# Patient Record
Sex: Female | Born: 1950 | Race: White | Hispanic: No | Marital: Married | Smoking: Former smoker
Health system: Southern US, Academic
[De-identification: ages and names within clinical notes are randomized; demographics above are authoritative.]

## PROBLEM LIST (undated history)

## (undated) DIAGNOSIS — Z800 Family history of malignant neoplasm of digestive organs: Secondary | ICD-10-CM

## (undated) DIAGNOSIS — R931 Abnormal findings on diagnostic imaging of heart and coronary circulation: Secondary | ICD-10-CM

## (undated) DIAGNOSIS — J449 Chronic obstructive pulmonary disease, unspecified: Secondary | ICD-10-CM

## (undated) DIAGNOSIS — C3491 Malignant neoplasm of unspecified part of right bronchus or lung: Secondary | ICD-10-CM

## (undated) DIAGNOSIS — K219 Gastro-esophageal reflux disease without esophagitis: Secondary | ICD-10-CM

## (undated) DIAGNOSIS — D649 Anemia, unspecified: Secondary | ICD-10-CM

## (undated) DIAGNOSIS — Z808 Family history of malignant neoplasm of other organs or systems: Secondary | ICD-10-CM

## (undated) DIAGNOSIS — Z8049 Family history of malignant neoplasm of other genital organs: Secondary | ICD-10-CM

## (undated) DIAGNOSIS — I10 Essential (primary) hypertension: Secondary | ICD-10-CM

## (undated) DIAGNOSIS — C342 Malignant neoplasm of middle lobe, bronchus or lung: Secondary | ICD-10-CM

## (undated) DIAGNOSIS — M1990 Unspecified osteoarthritis, unspecified site: Secondary | ICD-10-CM

## (undated) DIAGNOSIS — R0600 Dyspnea, unspecified: Secondary | ICD-10-CM

## (undated) DIAGNOSIS — I341 Nonrheumatic mitral (valve) prolapse: Secondary | ICD-10-CM

## (undated) DIAGNOSIS — C3490 Malignant neoplasm of unspecified part of unspecified bronchus or lung: Secondary | ICD-10-CM

## (undated) DIAGNOSIS — Z9221 Personal history of antineoplastic chemotherapy: Secondary | ICD-10-CM

## (undated) DIAGNOSIS — N6452 Nipple discharge: Secondary | ICD-10-CM

## (undated) DIAGNOSIS — N63 Unspecified lump in unspecified breast: Secondary | ICD-10-CM

## (undated) DIAGNOSIS — J309 Allergic rhinitis, unspecified: Secondary | ICD-10-CM

## (undated) DIAGNOSIS — K635 Polyp of colon: Secondary | ICD-10-CM

## (undated) DIAGNOSIS — J45909 Unspecified asthma, uncomplicated: Secondary | ICD-10-CM

## (undated) DIAGNOSIS — Z803 Family history of malignant neoplasm of breast: Secondary | ICD-10-CM

## (undated) DIAGNOSIS — Z923 Personal history of irradiation: Secondary | ICD-10-CM

## (undated) DIAGNOSIS — E785 Hyperlipidemia, unspecified: Secondary | ICD-10-CM

## (undated) DIAGNOSIS — C801 Malignant (primary) neoplasm, unspecified: Secondary | ICD-10-CM

## (undated) DIAGNOSIS — R011 Cardiac murmur, unspecified: Secondary | ICD-10-CM

## (undated) DIAGNOSIS — Z973 Presence of spectacles and contact lenses: Secondary | ICD-10-CM

## (undated) DIAGNOSIS — M129 Arthropathy, unspecified: Secondary | ICD-10-CM

## (undated) HISTORY — DX: Essential (primary) hypertension: I10

## (undated) HISTORY — PX: FOOT FRACTURE SURGERY: SHX645

## (undated) HISTORY — PX: KNEE ARTHROSCOPY: SUR90

## (undated) HISTORY — DX: Arthropathy, unspecified: M12.9

## (undated) HISTORY — DX: Malignant (primary) neoplasm, unspecified (CMS HCC): C80.1

## (undated) HISTORY — PX: SALIVARY GLAND SURGERY: SHX768

## (undated) HISTORY — DX: Family history of malignant neoplasm of other genital organs: Z80.49

## (undated) HISTORY — DX: Unspecified asthma, uncomplicated: J45.909

## (undated) HISTORY — PX: ABDOMINAL HYSTERECTOMY: SHX81

## (undated) HISTORY — DX: Family history of malignant neoplasm of other organs or systems: Z80.8

## (undated) HISTORY — DX: Malignant neoplasm of middle lobe, bronchus or lung: C34.2

## (undated) HISTORY — DX: Gastro-esophageal reflux disease without esophagitis: K21.9

## (undated) HISTORY — DX: Nonrheumatic mitral (valve) prolapse: I34.1

## (undated) HISTORY — PX: LUNG LOBECTOMY: SHX167

## (undated) HISTORY — DX: Family history of malignant neoplasm of digestive organs: Z80.0

## (undated) HISTORY — PX: BREAST EXCISIONAL BIOPSY: SUR124

## (undated) HISTORY — PX: BAND HEMORRHOIDECTOMY: SHX1213

## (undated) HISTORY — DX: Family history of malignant neoplasm of breast: Z80.3

---
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
## 1981-07-04 HISTORY — PX: SALIVARY GLAND SURGERY: SHX768

## 1990-04-05 DIAGNOSIS — R931 Abnormal findings on diagnostic imaging of heart and coronary circulation: Secondary | ICD-10-CM | POA: Insufficient documentation

## 2003-04-01 ENCOUNTER — Ambulatory Visit (HOSPITAL_COMMUNITY): Payer: Self-pay

## 2003-05-08 ENCOUNTER — Other Ambulatory Visit: Payer: Self-pay

## 2003-12-16 ENCOUNTER — Ambulatory Visit (INDEPENDENT_AMBULATORY_CARE_PROVIDER_SITE_OTHER): Payer: Self-pay

## 2004-01-06 ENCOUNTER — Ambulatory Visit (INDEPENDENT_AMBULATORY_CARE_PROVIDER_SITE_OTHER): Payer: Self-pay

## 2004-01-13 ENCOUNTER — Ambulatory Visit (INDEPENDENT_AMBULATORY_CARE_PROVIDER_SITE_OTHER): Payer: Self-pay

## 2004-04-22 ENCOUNTER — Ambulatory Visit (INDEPENDENT_AMBULATORY_CARE_PROVIDER_SITE_OTHER): Payer: Self-pay

## 2004-07-15 ENCOUNTER — Ambulatory Visit: Payer: Self-pay | Admitting: General Surgery

## 2005-05-18 ENCOUNTER — Ambulatory Visit: Payer: Self-pay | Admitting: Internal Medicine

## 2005-07-04 HISTORY — PX: KNEE SURGERY: SHX244

## 2005-07-20 ENCOUNTER — Ambulatory Visit: Payer: Self-pay | Admitting: General Surgery

## 2005-07-29 ENCOUNTER — Ambulatory Visit: Payer: Self-pay

## 2005-12-05 ENCOUNTER — Ambulatory Visit: Payer: Self-pay | Admitting: General Practice

## 2005-12-22 ENCOUNTER — Encounter: Payer: Self-pay | Admitting: General Practice

## 2006-01-01 ENCOUNTER — Encounter: Payer: Self-pay | Admitting: General Practice

## 2006-05-30 ENCOUNTER — Ambulatory Visit: Payer: Self-pay | Admitting: Internal Medicine

## 2006-06-28 ENCOUNTER — Ambulatory Visit: Payer: Self-pay | Admitting: Internal Medicine

## 2006-07-04 HISTORY — PX: UPPER GI ENDOSCOPY: SHX6162

## 2006-07-04 HISTORY — PX: COLONOSCOPY: SHX174

## 2006-07-19 ENCOUNTER — Ambulatory Visit: Payer: Self-pay | Admitting: General Surgery

## 2006-09-07 ENCOUNTER — Ambulatory Visit: Payer: Self-pay | Admitting: Gastroenterology

## 2006-09-26 ENCOUNTER — Ambulatory Visit: Payer: Self-pay | Admitting: Gastroenterology

## 2006-10-03 ENCOUNTER — Ambulatory Visit: Payer: Self-pay | Admitting: Gastroenterology

## 2007-07-26 ENCOUNTER — Ambulatory Visit: Payer: Self-pay | Admitting: General Surgery

## 2008-08-06 ENCOUNTER — Ambulatory Visit: Payer: Self-pay | Admitting: General Surgery

## 2009-07-07 DIAGNOSIS — C4492 Squamous cell carcinoma of skin, unspecified: Secondary | ICD-10-CM

## 2009-07-07 HISTORY — DX: Squamous cell carcinoma of skin, unspecified: C44.92

## 2009-08-05 ENCOUNTER — Ambulatory Visit: Payer: Self-pay | Admitting: General Surgery

## 2010-08-09 ENCOUNTER — Ambulatory Visit: Payer: Self-pay | Admitting: General Surgery

## 2010-09-09 ENCOUNTER — Encounter: Payer: Self-pay | Admitting: Orthopedic Surgery

## 2010-10-03 ENCOUNTER — Encounter: Payer: Self-pay | Admitting: Orthopedic Surgery

## 2011-02-02 ENCOUNTER — Ambulatory Visit: Payer: Self-pay | Admitting: Internal Medicine

## 2011-02-15 ENCOUNTER — Ambulatory Visit: Payer: Self-pay | Admitting: Internal Medicine

## 2011-03-05 ENCOUNTER — Ambulatory Visit: Payer: Self-pay | Admitting: Internal Medicine

## 2011-05-04 ENCOUNTER — Ambulatory Visit: Payer: Self-pay | Admitting: Internal Medicine

## 2011-05-05 ENCOUNTER — Ambulatory Visit: Payer: Self-pay | Admitting: Internal Medicine

## 2011-08-31 ENCOUNTER — Ambulatory Visit: Payer: Self-pay | Admitting: Internal Medicine

## 2011-08-31 ENCOUNTER — Ambulatory Visit: Payer: Self-pay | Admitting: General Surgery

## 2011-08-31 LAB — IRON AND TIBC
Iron Saturation: 28 %
Iron: 101 ug/dL (ref 50–170)
Unbound Iron-Bind.Cap.: 255 ug/dL

## 2011-09-02 ENCOUNTER — Ambulatory Visit: Payer: Self-pay | Admitting: Internal Medicine

## 2011-12-14 ENCOUNTER — Ambulatory Visit: Payer: Self-pay | Admitting: Internal Medicine

## 2011-12-14 LAB — IRON AND TIBC
Iron Bind.Cap.(Total): 346 ug/dL (ref 250–450)
Iron: 108 ug/dL (ref 50–170)

## 2011-12-14 LAB — FERRITIN: Ferritin (ARMC): 113 ng/mL (ref 8–388)

## 2011-12-14 LAB — CANCER CENTER HEMOGLOBIN: HGB: 13.2 g/dL (ref 12.0–16.0)

## 2012-01-02 ENCOUNTER — Ambulatory Visit: Payer: Self-pay | Admitting: Internal Medicine

## 2012-01-10 ENCOUNTER — Emergency Department (HOSPITAL_COMMUNITY): Payer: Self-pay

## 2012-01-11 ENCOUNTER — Ambulatory Visit: Payer: Self-pay | Admitting: Internal Medicine

## 2012-04-04 ENCOUNTER — Ambulatory Visit: Payer: Self-pay | Admitting: Internal Medicine

## 2012-04-04 LAB — CBC CANCER CENTER
Basophil %: 0.8 %
Eosinophil #: 0.1 x10 3/mm (ref 0.0–0.7)
Eosinophil %: 2.4 %
HCT: 41.3 % (ref 35.0–47.0)
HGB: 13.5 g/dL (ref 12.0–16.0)
Lymphocyte %: 24.3 %
MCHC: 32.7 g/dL (ref 32.0–36.0)
Monocyte #: 0.3 x10 3/mm (ref 0.2–0.9)
Neutrophil #: 2.8 x10 3/mm (ref 1.4–6.5)
Neutrophil %: 64.8 %
RBC: 4.53 10*6/uL (ref 3.80–5.20)

## 2012-04-04 LAB — IRON AND TIBC
Iron Bind.Cap.(Total): 341 ug/dL (ref 250–450)
Iron Saturation: 30 %
Iron: 103 ug/dL (ref 50–170)
Unbound Iron-Bind.Cap.: 238 ug/dL

## 2012-05-04 ENCOUNTER — Ambulatory Visit: Payer: Self-pay | Admitting: Internal Medicine

## 2012-05-22 ENCOUNTER — Ambulatory Visit: Payer: Self-pay | Admitting: Gastroenterology

## 2012-08-10 ENCOUNTER — Encounter: Payer: Self-pay | Admitting: *Deleted

## 2012-08-10 DIAGNOSIS — I341 Nonrheumatic mitral (valve) prolapse: Secondary | ICD-10-CM | POA: Insufficient documentation

## 2012-08-10 DIAGNOSIS — K219 Gastro-esophageal reflux disease without esophagitis: Secondary | ICD-10-CM | POA: Insufficient documentation

## 2012-08-10 DIAGNOSIS — J45909 Unspecified asthma, uncomplicated: Secondary | ICD-10-CM | POA: Insufficient documentation

## 2012-08-31 ENCOUNTER — Ambulatory Visit: Payer: Self-pay | Admitting: Internal Medicine

## 2013-01-07 ENCOUNTER — Ambulatory Visit: Payer: Self-pay | Admitting: General Practice

## 2013-02-01 ENCOUNTER — Ambulatory Visit: Payer: Self-pay | Admitting: Anesthesiology

## 2013-02-06 ENCOUNTER — Ambulatory Visit: Payer: Self-pay | Admitting: General Practice

## 2013-07-04 DIAGNOSIS — C3491 Malignant neoplasm of unspecified part of right bronchus or lung: Secondary | ICD-10-CM

## 2013-07-04 HISTORY — DX: Malignant neoplasm of unspecified part of right bronchus or lung: C34.91

## 2013-07-25 ENCOUNTER — Ambulatory Visit: Payer: Self-pay | Admitting: Internal Medicine

## 2013-08-30 ENCOUNTER — Ambulatory Visit: Payer: Self-pay | Admitting: Internal Medicine

## 2013-09-02 ENCOUNTER — Ambulatory Visit: Payer: Self-pay | Admitting: Internal Medicine

## 2013-09-04 ENCOUNTER — Ambulatory Visit: Payer: Self-pay | Admitting: Internal Medicine

## 2013-09-09 ENCOUNTER — Ambulatory Visit: Payer: Self-pay | Admitting: Cardiothoracic Surgery

## 2013-09-12 ENCOUNTER — Ambulatory Visit: Payer: Self-pay | Admitting: Cardiothoracic Surgery

## 2013-09-12 LAB — COMPREHENSIVE METABOLIC PANEL
ALBUMIN: 4 g/dL (ref 3.4–5.0)
Alkaline Phosphatase: 87 U/L
Anion Gap: 10 (ref 7–16)
BUN: 13 mg/dL (ref 7–18)
Bilirubin,Total: 0.4 mg/dL (ref 0.2–1.0)
CHLORIDE: 102 mmol/L (ref 98–107)
Calcium, Total: 9.7 mg/dL (ref 8.5–10.1)
Co2: 29 mmol/L (ref 21–32)
Creatinine: 0.76 mg/dL (ref 0.60–1.30)
Glucose: 100 mg/dL — ABNORMAL HIGH (ref 65–99)
Osmolality: 281 (ref 275–301)
Potassium: 4 mmol/L (ref 3.5–5.1)
SGOT(AST): 25 U/L (ref 15–37)
SGPT (ALT): 30 U/L (ref 12–78)
Sodium: 141 mmol/L (ref 136–145)
TOTAL PROTEIN: 8.1 g/dL (ref 6.4–8.2)

## 2013-09-12 LAB — CBC CANCER CENTER
BASOS ABS: 0.1 x10 3/mm (ref 0.0–0.1)
Basophil %: 1.2 %
Eosinophil #: 0.2 x10 3/mm (ref 0.0–0.7)
Eosinophil %: 4.1 %
HCT: 42.3 % (ref 35.0–47.0)
HGB: 14 g/dL (ref 12.0–16.0)
LYMPHS PCT: 20.1 %
Lymphocyte #: 1 x10 3/mm (ref 1.0–3.6)
MCH: 29.5 pg (ref 26.0–34.0)
MCHC: 33.2 g/dL (ref 32.0–36.0)
MCV: 89 fL (ref 80–100)
MONO ABS: 0.4 x10 3/mm (ref 0.2–0.9)
MONOS PCT: 8.4 %
NEUTROS PCT: 66.2 %
Neutrophil #: 3.4 x10 3/mm (ref 1.4–6.5)
Platelet: 351 x10 3/mm (ref 150–440)
RBC: 4.76 10*6/uL (ref 3.80–5.20)
RDW: 13.6 % (ref 11.5–14.5)
WBC: 5.1 x10 3/mm (ref 3.6–11.0)

## 2013-09-12 LAB — PROTIME-INR
INR: 1
PROTHROMBIN TIME: 12.6 s (ref 11.5–14.7)

## 2013-09-12 LAB — APTT: Activated PTT: 37.3 secs — ABNORMAL HIGH (ref 23.6–35.9)

## 2013-09-18 ENCOUNTER — Ambulatory Visit: Payer: Self-pay | Admitting: Cardiothoracic Surgery

## 2013-10-02 ENCOUNTER — Ambulatory Visit: Payer: Self-pay | Admitting: Cardiothoracic Surgery

## 2013-10-07 ENCOUNTER — Ambulatory Visit: Payer: Self-pay | Admitting: Cardiothoracic Surgery

## 2013-10-09 LAB — PATHOLOGY REPORT

## 2013-10-10 LAB — COMPREHENSIVE METABOLIC PANEL
ALK PHOS: 69 U/L
ANION GAP: 3 — AB (ref 7–16)
AST: 17 U/L (ref 15–37)
Albumin: 4.1 g/dL (ref 3.4–5.0)
BUN: 15 mg/dL (ref 7–18)
Bilirubin,Total: 0.4 mg/dL (ref 0.2–1.0)
Calcium, Total: 9.7 mg/dL (ref 8.5–10.1)
Chloride: 100 mmol/L (ref 98–107)
Co2: 36 mmol/L — ABNORMAL HIGH (ref 21–32)
Creatinine: 1.06 mg/dL (ref 0.60–1.30)
EGFR (African American): >60
EGFR (Non-African Amer.): 56 — ABNORMAL LOW
Glucose: 108 mg/dL — ABNORMAL HIGH (ref 65–99)
Osmolality: 279 (ref 275–301)
Potassium: 3.6 mmol/L (ref 3.5–5.1)
SGPT (ALT): 25 U/L (ref 12–78)
Sodium: 139 mmol/L (ref 136–145)
TOTAL PROTEIN: 7.9 g/dL (ref 6.4–8.2)

## 2013-10-10 LAB — PROTIME-INR
INR: 1
Prothrombin Time: 13.4 secs (ref 11.5–14.7)

## 2013-10-10 LAB — CBC CANCER CENTER
Basophil #: 0.1 x10 3/mm (ref 0.0–0.1)
Basophil %: 1.1 %
Eosinophil #: 0.1 x10 3/mm (ref 0.0–0.7)
Eosinophil %: 2.6 %
HCT: 42.8 % (ref 35.0–47.0)
HGB: 14 g/dL (ref 12.0–16.0)
LYMPHS ABS: 1.4 x10 3/mm (ref 1.0–3.6)
LYMPHS PCT: 29.3 %
MCH: 29.3 pg (ref 26.0–34.0)
MCHC: 32.8 g/dL (ref 32.0–36.0)
MCV: 89 fL (ref 80–100)
MONO ABS: 0.3 x10 3/mm (ref 0.2–0.9)
MONOS PCT: 6.5 %
NEUTROS ABS: 2.9 x10 3/mm (ref 1.4–6.5)
NEUTROS PCT: 60.5 %
Platelet: 339 x10 3/mm (ref 150–440)
RBC: 4.79 10*6/uL (ref 3.80–5.20)
RDW: 13.6 % (ref 11.5–14.5)
WBC: 4.8 x10 3/mm (ref 3.6–11.0)

## 2013-10-10 LAB — APTT: Activated PTT: 34.4 secs (ref 23.6–35.9)

## 2013-10-22 ENCOUNTER — Ambulatory Visit: Payer: Self-pay

## 2013-10-30 ENCOUNTER — Inpatient Hospital Stay: Payer: Self-pay | Admitting: Internal Medicine

## 2013-10-30 LAB — HEMOGLOBIN: HGB: 12.4 g/dL (ref 12.0–16.0)

## 2013-10-31 LAB — BASIC METABOLIC PANEL
Anion Gap: 4 — ABNORMAL LOW (ref 7–16)
BUN: 6 mg/dL — ABNORMAL LOW (ref 7–18)
CALCIUM: 8.2 mg/dL — AB (ref 8.5–10.1)
CHLORIDE: 105 mmol/L (ref 98–107)
Co2: 27 mmol/L (ref 21–32)
Creatinine: 0.47 mg/dL — ABNORMAL LOW (ref 0.60–1.30)
EGFR (African American): >60
Glucose: 140 mg/dL — ABNORMAL HIGH (ref 65–99)
Osmolality: 272 (ref 275–301)
Potassium: 4.1 mmol/L (ref 3.5–5.1)
Sodium: 136 mmol/L (ref 136–145)

## 2013-10-31 LAB — CBC WITH DIFFERENTIAL/PLATELET
BASOS ABS: 0 10*3/uL (ref 0.0–0.1)
BASOS PCT: 0.2 %
Eosinophil #: 0 10*3/uL (ref 0.0–0.7)
Eosinophil %: 0 %
HCT: 36.3 % (ref 35.0–47.0)
HGB: 12 g/dL (ref 12.0–16.0)
LYMPHS ABS: 0.8 10*3/uL — AB (ref 1.0–3.6)
Lymphocyte %: 8 %
MCH: 29.9 pg (ref 26.0–34.0)
MCHC: 33.2 g/dL (ref 32.0–36.0)
MCV: 90 fL (ref 80–100)
MONO ABS: 0.7 x10 3/mm (ref 0.2–0.9)
Monocyte %: 6.9 %
NEUTROS ABS: 8.4 10*3/uL — AB (ref 1.4–6.5)
Neutrophil %: 84.9 %
Platelet: 271 10*3/uL (ref 150–440)
RBC: 4.02 10*6/uL (ref 3.80–5.20)
RDW: 13.8 % (ref 11.5–14.5)
WBC: 9.9 10*3/uL (ref 3.6–11.0)

## 2013-11-01 ENCOUNTER — Ambulatory Visit: Payer: Self-pay | Admitting: Cardiothoracic Surgery

## 2013-11-01 ENCOUNTER — Ambulatory Visit: Payer: Self-pay | Admitting: Oncology

## 2013-11-01 LAB — CBC WITH DIFFERENTIAL/PLATELET
BASOS ABS: 0 10*3/uL (ref 0.0–0.1)
Basophil %: 0.3 %
Eosinophil #: 0 10*3/uL (ref 0.0–0.7)
Eosinophil %: 0.3 %
HCT: 34.2 % — ABNORMAL LOW (ref 35.0–47.0)
HGB: 11.1 g/dL — AB (ref 12.0–16.0)
LYMPHS PCT: 8.5 %
Lymphocyte #: 0.6 10*3/uL — ABNORMAL LOW (ref 1.0–3.6)
MCH: 29.6 pg (ref 26.0–34.0)
MCHC: 32.5 g/dL (ref 32.0–36.0)
MCV: 91 fL (ref 80–100)
MONOS PCT: 7.5 %
Monocyte #: 0.5 x10 3/mm (ref 0.2–0.9)
Neutrophil #: 6 10*3/uL (ref 1.4–6.5)
Neutrophil %: 83.4 %
Platelet: 191 10*3/uL (ref 150–440)
RBC: 3.75 10*6/uL — ABNORMAL LOW (ref 3.80–5.20)
RDW: 13.8 % (ref 11.5–14.5)
WBC: 7.1 10*3/uL (ref 3.6–11.0)

## 2013-11-01 LAB — BASIC METABOLIC PANEL
Anion Gap: 4 — ABNORMAL LOW (ref 7–16)
BUN: 5 mg/dL — ABNORMAL LOW (ref 7–18)
CHLORIDE: 101 mmol/L (ref 98–107)
CO2: 26 mmol/L (ref 21–32)
Calcium, Total: 8.2 mg/dL — ABNORMAL LOW (ref 8.5–10.1)
Creatinine: 0.37 mg/dL — ABNORMAL LOW (ref 0.60–1.30)
GLUCOSE: 126 mg/dL — AB (ref 65–99)
Osmolality: 261 (ref 275–301)
Potassium: 3.8 mmol/L (ref 3.5–5.1)
Sodium: 131 mmol/L — ABNORMAL LOW (ref 136–145)

## 2013-11-02 LAB — BASIC METABOLIC PANEL
Anion Gap: 4 — ABNORMAL LOW (ref 7–16)
BUN: 6 mg/dL — ABNORMAL LOW (ref 7–18)
CHLORIDE: 106 mmol/L (ref 98–107)
Calcium, Total: 8.5 mg/dL (ref 8.5–10.1)
Co2: 25 mmol/L (ref 21–32)
Creatinine: 0.39 mg/dL — ABNORMAL LOW (ref 0.60–1.30)
EGFR (Non-African Amer.): >60
GLUCOSE: 97 mg/dL (ref 65–99)
OSMOLALITY: 268 (ref 275–301)
Potassium: 3.3 mmol/L — ABNORMAL LOW (ref 3.5–5.1)
Sodium: 135 mmol/L — ABNORMAL LOW (ref 136–145)

## 2013-11-02 LAB — URINALYSIS, COMPLETE
Bacteria: NONE SEEN
Bilirubin,UR: NEGATIVE
Glucose,UR: NEGATIVE mg/dL (ref 0–75)
Leukocyte Esterase: NEGATIVE
Nitrite: NEGATIVE
PROTEIN: NEGATIVE
Ph: 6 (ref 4.5–8.0)
Specific Gravity: 1.003 (ref 1.003–1.030)
Squamous Epithelial: NONE SEEN
WBC UR: NONE SEEN /HPF (ref 0–5)

## 2013-11-02 LAB — CBC WITH DIFFERENTIAL/PLATELET
BASOS PCT: 0.2 %
Basophil #: 0 10*3/uL (ref 0.0–0.1)
EOS ABS: 0.1 10*3/uL (ref 0.0–0.7)
EOS PCT: 1.7 %
HCT: 36.5 % (ref 35.0–47.0)
HGB: 11.9 g/dL — ABNORMAL LOW (ref 12.0–16.0)
LYMPHS PCT: 6.1 %
Lymphocyte #: 0.4 10*3/uL — ABNORMAL LOW (ref 1.0–3.6)
MCH: 29.5 pg (ref 26.0–34.0)
MCHC: 32.7 g/dL (ref 32.0–36.0)
MCV: 90 fL (ref 80–100)
MONOS PCT: 6 %
Monocyte #: 0.4 x10 3/mm (ref 0.2–0.9)
NEUTROS ABS: 5.7 10*3/uL (ref 1.4–6.5)
NEUTROS PCT: 86 %
Platelet: 236 10*3/uL (ref 150–440)
RBC: 4.04 10*6/uL (ref 3.80–5.20)
RDW: 13.6 % (ref 11.5–14.5)
WBC: 6.6 10*3/uL (ref 3.6–11.0)

## 2013-11-02 LAB — PATHOLOGY REPORT

## 2013-11-05 LAB — BASIC METABOLIC PANEL
Anion Gap: 5 — ABNORMAL LOW (ref 7–16)
BUN: 7 mg/dL (ref 7–18)
CHLORIDE: 103 mmol/L (ref 98–107)
Calcium, Total: 8.7 mg/dL (ref 8.5–10.1)
Co2: 31 mmol/L (ref 21–32)
Creatinine: 0.69 mg/dL (ref 0.60–1.30)
EGFR (Non-African Amer.): >60
Glucose: 104 mg/dL — ABNORMAL HIGH (ref 65–99)
Osmolality: 276 (ref 275–301)
POTASSIUM: 3.5 mmol/L (ref 3.5–5.1)
Sodium: 139 mmol/L (ref 136–145)

## 2013-11-05 LAB — CBC WITH DIFFERENTIAL/PLATELET
Basophil #: 0 10*3/uL (ref 0.0–0.1)
Basophil %: 0.8 %
Eosinophil #: 0.3 10*3/uL (ref 0.0–0.7)
Eosinophil %: 8 %
HCT: 31.8 % — ABNORMAL LOW (ref 35.0–47.0)
HGB: 10.9 g/dL — AB (ref 12.0–16.0)
LYMPHS PCT: 23.7 %
Lymphocyte #: 0.9 10*3/uL — ABNORMAL LOW (ref 1.0–3.6)
MCH: 30.2 pg (ref 26.0–34.0)
MCHC: 34.2 g/dL (ref 32.0–36.0)
MCV: 88 fL (ref 80–100)
Monocyte #: 0.5 x10 3/mm (ref 0.2–0.9)
Monocyte %: 14.3 %
Neutrophil #: 2 10*3/uL (ref 1.4–6.5)
Neutrophil %: 53.2 %
Platelet: 286 10*3/uL (ref 150–440)
RBC: 3.61 10*6/uL — AB (ref 3.80–5.20)
RDW: 13.1 % (ref 11.5–14.5)
WBC: 3.7 10*3/uL (ref 3.6–11.0)

## 2013-11-07 LAB — IRON AND TIBC
IRON: 39 ug/dL — AB (ref 50–170)
Iron Bind.Cap.(Total): 272 ug/dL (ref 250–450)
Iron Saturation: 14 %
Unbound Iron-Bind.Cap.: 233 ug/dL

## 2013-11-07 LAB — FERRITIN: Ferritin (ARMC): 164 ng/mL (ref 8–388)

## 2013-11-21 LAB — COMPREHENSIVE METABOLIC PANEL
ALT: 26 U/L (ref 12–78)
Albumin: 3.4 g/dL (ref 3.4–5.0)
Alkaline Phosphatase: 93 U/L
Anion Gap: 7 (ref 7–16)
BILIRUBIN TOTAL: 0.3 mg/dL (ref 0.2–1.0)
BUN: 10 mg/dL (ref 7–18)
CHLORIDE: 103 mmol/L (ref 98–107)
Calcium, Total: 8.4 mg/dL — ABNORMAL LOW (ref 8.5–10.1)
Co2: 32 mmol/L (ref 21–32)
Creatinine: 0.67 mg/dL (ref 0.60–1.30)
EGFR (Non-African Amer.): >60
Glucose: 79 mg/dL (ref 65–99)
Osmolality: 281 (ref 275–301)
POTASSIUM: 3.8 mmol/L (ref 3.5–5.1)
SGOT(AST): 16 U/L (ref 15–37)
SODIUM: 142 mmol/L (ref 136–145)
Total Protein: 7.1 g/dL (ref 6.4–8.2)

## 2013-11-21 LAB — CBC CANCER CENTER
BASOS ABS: 0.1 x10 3/mm (ref 0.0–0.1)
Basophil %: 1.2 %
Eosinophil #: 0.8 x10 3/mm — ABNORMAL HIGH (ref 0.0–0.7)
Eosinophil %: 13.7 %
HCT: 36.6 % (ref 35.0–47.0)
HGB: 12.4 g/dL (ref 12.0–16.0)
Lymphocyte #: 1.1 x10 3/mm (ref 1.0–3.6)
Lymphocyte %: 19.1 %
MCH: 29.5 pg (ref 26.0–34.0)
MCHC: 33.9 g/dL (ref 32.0–36.0)
MCV: 87 fL (ref 80–100)
Monocyte #: 0.5 x10 3/mm (ref 0.2–0.9)
Monocyte %: 7.8 %
NEUTROS ABS: 3.5 x10 3/mm (ref 1.4–6.5)
NEUTROS PCT: 58.2 %
PLATELETS: 436 x10 3/mm (ref 150–440)
RBC: 4.2 10*6/uL (ref 3.80–5.20)
RDW: 13.7 % (ref 11.5–14.5)
WBC: 5.9 x10 3/mm (ref 3.6–11.0)

## 2013-11-28 LAB — CBC CANCER CENTER
Basophil #: 0 x10 3/mm (ref 0.0–0.1)
Basophil %: 1.1 %
Eosinophil #: 0.1 x10 3/mm (ref 0.0–0.7)
Eosinophil %: 2.4 %
HCT: 35.4 % (ref 35.0–47.0)
HGB: 11.7 g/dL — ABNORMAL LOW (ref 12.0–16.0)
LYMPHS PCT: 33.5 %
Lymphocyte #: 1.1 x10 3/mm (ref 1.0–3.6)
MCH: 29.6 pg (ref 26.0–34.0)
MCHC: 33.1 g/dL (ref 32.0–36.0)
MCV: 89 fL (ref 80–100)
Monocyte #: 0.3 x10 3/mm (ref 0.2–0.9)
Monocyte %: 9.2 %
NEUTROS ABS: 1.8 x10 3/mm (ref 1.4–6.5)
Neutrophil %: 53.8 %
Platelet: 249 x10 3/mm (ref 150–440)
RBC: 3.96 10*6/uL (ref 3.80–5.20)
RDW: 13.5 % (ref 11.5–14.5)
WBC: 3.3 x10 3/mm — ABNORMAL LOW (ref 3.6–11.0)

## 2013-12-02 ENCOUNTER — Ambulatory Visit: Payer: Self-pay | Admitting: Cardiothoracic Surgery

## 2013-12-02 ENCOUNTER — Ambulatory Visit: Payer: Self-pay | Admitting: Oncology

## 2013-12-05 LAB — CBC CANCER CENTER
BASOS ABS: 0 x10 3/mm (ref 0.0–0.1)
Basophil %: 0.5 %
EOS ABS: 0 x10 3/mm (ref 0.0–0.7)
Eosinophil %: 0.7 %
HCT: 38.1 % (ref 35.0–47.0)
HGB: 12.8 g/dL (ref 12.0–16.0)
LYMPHS ABS: 0.9 x10 3/mm — AB (ref 1.0–3.6)
Lymphocyte %: 18.6 %
MCH: 29.7 pg (ref 26.0–34.0)
MCHC: 33.5 g/dL (ref 32.0–36.0)
MCV: 89 fL (ref 80–100)
MONO ABS: 0.6 x10 3/mm (ref 0.2–0.9)
Monocyte %: 11.3 %
NEUTROS PCT: 68.9 %
Neutrophil #: 3.3 x10 3/mm (ref 1.4–6.5)
PLATELETS: 219 x10 3/mm (ref 150–440)
RBC: 4.3 10*6/uL (ref 3.80–5.20)
RDW: 13.9 % (ref 11.5–14.5)
WBC: 4.9 x10 3/mm (ref 3.6–11.0)

## 2013-12-12 LAB — CBC CANCER CENTER
Basophil #: 0 x10 3/mm (ref 0.0–0.1)
Basophil %: 1 %
EOS ABS: 0 x10 3/mm (ref 0.0–0.7)
EOS PCT: 1 %
HCT: 38 % (ref 35.0–47.0)
HGB: 12.5 g/dL (ref 12.0–16.0)
Lymphocyte #: 1 x10 3/mm (ref 1.0–3.6)
Lymphocyte %: 33.3 %
MCH: 29.6 pg (ref 26.0–34.0)
MCHC: 33 g/dL (ref 32.0–36.0)
MCV: 90 fL (ref 80–100)
MONOS PCT: 11.2 %
Monocyte #: 0.3 x10 3/mm (ref 0.2–0.9)
Neutrophil #: 1.6 x10 3/mm (ref 1.4–6.5)
Neutrophil %: 53.5 %
Platelet: 503 x10 3/mm — ABNORMAL HIGH (ref 150–440)
RBC: 4.24 10*6/uL (ref 3.80–5.20)
RDW: 14.2 % (ref 11.5–14.5)
WBC: 3 x10 3/mm — ABNORMAL LOW (ref 3.6–11.0)

## 2013-12-12 LAB — COMPREHENSIVE METABOLIC PANEL
ALK PHOS: 88 U/L
ALT: 27 U/L (ref 12–78)
Albumin: 3.6 g/dL (ref 3.4–5.0)
Anion Gap: 8 (ref 7–16)
BUN: 13 mg/dL (ref 7–18)
Bilirubin,Total: 0.3 mg/dL (ref 0.2–1.0)
CALCIUM: 9.2 mg/dL (ref 8.5–10.1)
CHLORIDE: 102 mmol/L (ref 98–107)
Co2: 30 mmol/L (ref 21–32)
Creatinine: 0.76 mg/dL (ref 0.60–1.30)
EGFR (African American): >60
EGFR (Non-African Amer.): >60
Glucose: 90 mg/dL (ref 65–99)
OSMOLALITY: 279 (ref 275–301)
Potassium: 3.9 mmol/L (ref 3.5–5.1)
SGOT(AST): 16 U/L (ref 15–37)
Sodium: 140 mmol/L (ref 136–145)
Total Protein: 7.5 g/dL (ref 6.4–8.2)

## 2013-12-17 LAB — CBC CANCER CENTER
BASOS ABS: 0 x10 3/mm (ref 0.0–0.1)
Basophil %: 1.2 %
EOS ABS: 0.1 x10 3/mm (ref 0.0–0.7)
EOS PCT: 2.4 %
HCT: 38.1 % (ref 35.0–47.0)
HGB: 12.8 g/dL (ref 12.0–16.0)
Lymphocyte #: 1.1 x10 3/mm (ref 1.0–3.6)
Lymphocyte %: 34.8 %
MCH: 29.7 pg (ref 26.0–34.0)
MCHC: 33.5 g/dL (ref 32.0–36.0)
MCV: 89 fL (ref 80–100)
MONO ABS: 0.2 x10 3/mm (ref 0.2–0.9)
MONOS PCT: 5.1 %
Neutrophil #: 1.7 x10 3/mm (ref 1.4–6.5)
Neutrophil %: 56.5 %
Platelet: 361 x10 3/mm (ref 150–440)
RBC: 4.29 10*6/uL (ref 3.80–5.20)
RDW: 13.9 % (ref 11.5–14.5)
WBC: 3 x10 3/mm — AB (ref 3.6–11.0)

## 2013-12-25 ENCOUNTER — Other Ambulatory Visit (INDEPENDENT_AMBULATORY_CARE_PROVIDER_SITE_OTHER): Payer: No Typology Code available for payment source | Admitting: Surgery

## 2013-12-25 DIAGNOSIS — Z1211 Encounter for screening for malignant neoplasm of colon: Secondary | ICD-10-CM

## 2013-12-26 LAB — CBC CANCER CENTER
BASOS ABS: 0 x10 3/mm (ref 0.0–0.1)
BASOS PCT: 0.6 %
Eosinophil #: 0 x10 3/mm (ref 0.0–0.7)
Eosinophil %: 0.9 %
HCT: 39.7 % (ref 35.0–47.0)
HGB: 13 g/dL (ref 12.0–16.0)
LYMPHS ABS: 1 x10 3/mm (ref 1.0–3.6)
Lymphocyte %: 28.1 %
MCH: 29.5 pg (ref 26.0–34.0)
MCHC: 32.8 g/dL (ref 32.0–36.0)
MCV: 90 fL (ref 80–100)
MONO ABS: 0.5 x10 3/mm (ref 0.2–0.9)
Monocyte %: 13.4 %
NEUTROS PCT: 57 %
Neutrophil #: 2 x10 3/mm (ref 1.4–6.5)
Platelet: 168 x10 3/mm (ref 150–440)
RBC: 4.41 10*6/uL (ref 3.80–5.20)
RDW: 14.4 % (ref 11.5–14.5)
WBC: 3.6 x10 3/mm (ref 3.6–11.0)

## 2014-01-01 ENCOUNTER — Ambulatory Visit: Payer: Self-pay | Admitting: Oncology

## 2014-01-01 ENCOUNTER — Ambulatory Visit: Payer: Self-pay | Admitting: Cardiothoracic Surgery

## 2014-01-02 LAB — COMPREHENSIVE METABOLIC PANEL
ALK PHOS: 93 U/L
ALT: 24 U/L (ref 12–78)
Albumin: 3.8 g/dL (ref 3.4–5.0)
Anion Gap: 9 (ref 7–16)
BUN: 14 mg/dL (ref 7–18)
Bilirubin,Total: 0.4 mg/dL (ref 0.2–1.0)
CO2: 29 mmol/L (ref 21–32)
CREATININE: 0.78 mg/dL (ref 0.60–1.30)
Calcium, Total: 9.4 mg/dL (ref 8.5–10.1)
Chloride: 103 mmol/L (ref 98–107)
EGFR (African American): >60
Glucose: 75 mg/dL (ref 65–99)
OSMOLALITY: 280 (ref 275–301)
POTASSIUM: 3.9 mmol/L (ref 3.5–5.1)
SGOT(AST): 24 U/L (ref 15–37)
Sodium: 141 mmol/L (ref 136–145)
TOTAL PROTEIN: 7.9 g/dL (ref 6.4–8.2)

## 2014-01-02 LAB — CBC CANCER CENTER
BASOS ABS: 0 x10 3/mm (ref 0.0–0.1)
BASOS PCT: 0.9 %
Eosinophil #: 0 x10 3/mm (ref 0.0–0.7)
Eosinophil %: 1.8 %
HCT: 37.6 % (ref 35.0–47.0)
HGB: 12.5 g/dL (ref 12.0–16.0)
LYMPHS PCT: 37 %
Lymphocyte #: 1 x10 3/mm (ref 1.0–3.6)
MCH: 29.9 pg (ref 26.0–34.0)
MCHC: 33.3 g/dL (ref 32.0–36.0)
MCV: 90 fL (ref 80–100)
MONOS PCT: 12.9 %
Monocyte #: 0.3 x10 3/mm (ref 0.2–0.9)
Neutrophil #: 1.3 x10 3/mm — ABNORMAL LOW (ref 1.4–6.5)
Neutrophil %: 47.4 %
PLATELETS: 303 x10 3/mm (ref 150–440)
RBC: 4.18 10*6/uL (ref 3.80–5.20)
RDW: 15 % — ABNORMAL HIGH (ref 11.5–14.5)
WBC: 2.7 x10 3/mm — AB (ref 3.6–11.0)

## 2014-01-23 LAB — CBC CANCER CENTER
BASOS ABS: 0 x10 3/mm (ref 0.0–0.1)
BASOS PCT: 0.9 %
EOS ABS: 0 x10 3/mm (ref 0.0–0.7)
EOS PCT: 0.8 %
HCT: 36.9 % (ref 35.0–47.0)
HGB: 12.2 g/dL (ref 12.0–16.0)
LYMPHS PCT: 37.4 %
Lymphocyte #: 1 x10 3/mm (ref 1.0–3.6)
MCH: 30.3 pg (ref 26.0–34.0)
MCHC: 33.1 g/dL (ref 32.0–36.0)
MCV: 91 fL (ref 80–100)
Monocyte #: 0.4 x10 3/mm (ref 0.2–0.9)
Monocyte %: 14.9 %
NEUTROS ABS: 1.2 x10 3/mm — AB (ref 1.4–6.5)
Neutrophil %: 46 %
Platelet: 297 x10 3/mm (ref 150–440)
RBC: 4.04 10*6/uL (ref 3.80–5.20)
RDW: 17.7 % — AB (ref 11.5–14.5)
WBC: 2.5 x10 3/mm — ABNORMAL LOW (ref 3.6–11.0)

## 2014-01-23 LAB — COMPREHENSIVE METABOLIC PANEL
Albumin: 3.4 g/dL (ref 3.4–5.0)
Alkaline Phosphatase: 79 U/L
Anion Gap: 8 (ref 7–16)
BUN: 16 mg/dL (ref 7–18)
Bilirubin,Total: 0.3 mg/dL (ref 0.2–1.0)
CALCIUM: 8.7 mg/dL (ref 8.5–10.1)
CHLORIDE: 103 mmol/L (ref 98–107)
Co2: 27 mmol/L (ref 21–32)
Creatinine: 0.79 mg/dL (ref 0.60–1.30)
EGFR (Non-African Amer.): >60
Glucose: 63 mg/dL — ABNORMAL LOW (ref 65–99)
OSMOLALITY: 275 (ref 275–301)
POTASSIUM: 4.2 mmol/L (ref 3.5–5.1)
SGOT(AST): 18 U/L (ref 15–37)
SGPT (ALT): 24 U/L
Sodium: 138 mmol/L (ref 136–145)
Total Protein: 6.9 g/dL (ref 6.4–8.2)

## 2014-01-27 DIAGNOSIS — E785 Hyperlipidemia, unspecified: Secondary | ICD-10-CM | POA: Insufficient documentation

## 2014-01-30 LAB — CBC CANCER CENTER
Basophil #: 0 x10 3/mm (ref 0.0–0.1)
Basophil %: 0.4 %
EOS ABS: 0 x10 3/mm (ref 0.0–0.7)
Eosinophil %: 0.2 %
HCT: 37.2 % (ref 35.0–47.0)
HGB: 12.4 g/dL (ref 12.0–16.0)
LYMPHS PCT: 12.8 %
Lymphocyte #: 1.3 x10 3/mm (ref 1.0–3.6)
MCH: 30.8 pg (ref 26.0–34.0)
MCHC: 33.2 g/dL (ref 32.0–36.0)
MCV: 93 fL (ref 80–100)
MONO ABS: 1.4 x10 3/mm — AB (ref 0.2–0.9)
MONOS PCT: 14.1 %
Neutrophil #: 7.4 x10 3/mm — ABNORMAL HIGH (ref 1.4–6.5)
Neutrophil %: 72.5 %
Platelet: 156 x10 3/mm (ref 150–440)
RBC: 4.01 10*6/uL (ref 3.80–5.20)
RDW: 18.6 % — AB (ref 11.5–14.5)
WBC: 10.2 x10 3/mm (ref 3.6–11.0)

## 2014-02-01 ENCOUNTER — Ambulatory Visit: Payer: Self-pay | Admitting: Cardiothoracic Surgery

## 2014-02-01 ENCOUNTER — Ambulatory Visit: Payer: Self-pay | Admitting: Oncology

## 2014-02-03 LAB — COMPREHENSIVE METABOLIC PANEL
ALK PHOS: 131 U/L — AB
ALT: 21 U/L
AST: 18 U/L (ref 15–37)
Albumin: 3.7 g/dL (ref 3.4–5.0)
Anion Gap: 8 (ref 7–16)
BUN: 11 mg/dL (ref 7–18)
Bilirubin,Total: 0.2 mg/dL (ref 0.2–1.0)
CREATININE: 0.94 mg/dL (ref 0.60–1.30)
Calcium, Total: 8.9 mg/dL (ref 8.5–10.1)
Chloride: 102 mmol/L (ref 98–107)
Co2: 29 mmol/L (ref 21–32)
EGFR (African American): >60
EGFR (Non-African Amer.): >60
GLUCOSE: 86 mg/dL (ref 65–99)
Osmolality: 276 (ref 275–301)
Potassium: 4 mmol/L (ref 3.5–5.1)
Sodium: 139 mmol/L (ref 136–145)
Total Protein: 7.2 g/dL (ref 6.4–8.2)

## 2014-02-03 LAB — CBC CANCER CENTER
Basophil #: 0 x10 3/mm (ref 0.0–0.1)
Basophil %: 0.4 %
EOS ABS: 0 x10 3/mm (ref 0.0–0.7)
EOS PCT: 0.1 %
HCT: 35.8 % (ref 35.0–47.0)
HGB: 11.9 g/dL — AB (ref 12.0–16.0)
LYMPHS ABS: 1 x10 3/mm (ref 1.0–3.6)
Lymphocyte %: 10 %
MCH: 30.7 pg (ref 26.0–34.0)
MCHC: 33.1 g/dL (ref 32.0–36.0)
MCV: 93 fL (ref 80–100)
MONO ABS: 0.8 x10 3/mm (ref 0.2–0.9)
MONOS PCT: 8.9 %
NEUTROS ABS: 7.6 x10 3/mm — AB (ref 1.4–6.5)
NEUTROS PCT: 80.6 %
PLATELETS: 211 x10 3/mm (ref 150–440)
RBC: 3.86 10*6/uL (ref 3.80–5.20)
RDW: 18.6 % — ABNORMAL HIGH (ref 11.5–14.5)
WBC: 9.5 x10 3/mm (ref 3.6–11.0)

## 2014-02-11 LAB — CBC CANCER CENTER
BASOS PCT: 0.8 %
Basophil #: 0 x10 3/mm (ref 0.0–0.1)
EOS ABS: 0 x10 3/mm (ref 0.0–0.7)
EOS PCT: 0.2 %
HCT: 36.7 % (ref 35.0–47.0)
HGB: 12.3 g/dL (ref 12.0–16.0)
Lymphocyte #: 1.1 x10 3/mm (ref 1.0–3.6)
Lymphocyte %: 22.8 %
MCH: 31.4 pg (ref 26.0–34.0)
MCHC: 33.4 g/dL (ref 32.0–36.0)
MCV: 94 fL (ref 80–100)
MONO ABS: 0.6 x10 3/mm (ref 0.2–0.9)
Monocyte %: 12.1 %
Neutrophil #: 3 x10 3/mm (ref 1.4–6.5)
Neutrophil %: 64.1 %
Platelet: 483 x10 3/mm — ABNORMAL HIGH (ref 150–440)
RBC: 3.9 10*6/uL (ref 3.80–5.20)
RDW: 20.3 % — ABNORMAL HIGH (ref 11.5–14.5)
WBC: 4.6 x10 3/mm (ref 3.6–11.0)

## 2014-02-20 LAB — COMPREHENSIVE METABOLIC PANEL
ANION GAP: 9 (ref 7–16)
Albumin: 3.6 g/dL (ref 3.4–5.0)
Alkaline Phosphatase: 84 U/L
BUN: 12 mg/dL (ref 7–18)
Bilirubin,Total: 0.4 mg/dL (ref 0.2–1.0)
CREATININE: 0.86 mg/dL (ref 0.60–1.30)
Calcium, Total: 8.7 mg/dL (ref 8.5–10.1)
Chloride: 104 mmol/L (ref 98–107)
Co2: 28 mmol/L (ref 21–32)
EGFR (Non-African Amer.): >60
Glucose: 90 mg/dL (ref 65–99)
OSMOLALITY: 281 (ref 275–301)
Potassium: 4.3 mmol/L (ref 3.5–5.1)
SGOT(AST): 24 U/L (ref 15–37)
SGPT (ALT): 26 U/L
Sodium: 141 mmol/L (ref 136–145)
Total Protein: 7.1 g/dL (ref 6.4–8.2)

## 2014-02-20 LAB — CBC CANCER CENTER
BASOS PCT: 1.2 %
Basophil #: 0 x10 3/mm (ref 0.0–0.1)
EOS ABS: 0 x10 3/mm (ref 0.0–0.7)
Eosinophil %: 0.9 %
HCT: 37.6 % (ref 35.0–47.0)
HGB: 12.2 g/dL (ref 12.0–16.0)
Lymphocyte #: 1 x10 3/mm (ref 1.0–3.6)
Lymphocyte %: 24.3 %
MCH: 31.1 pg (ref 26.0–34.0)
MCHC: 32.6 g/dL (ref 32.0–36.0)
MCV: 96 fL (ref 80–100)
MONO ABS: 0.5 x10 3/mm (ref 0.2–0.9)
MONOS PCT: 12 %
NEUTROS ABS: 2.4 x10 3/mm (ref 1.4–6.5)
Neutrophil %: 61.6 %
PLATELETS: 271 x10 3/mm (ref 150–440)
RBC: 3.93 10*6/uL (ref 3.80–5.20)
RDW: 19.8 % — ABNORMAL HIGH (ref 11.5–14.5)
WBC: 3.9 x10 3/mm (ref 3.6–11.0)

## 2014-02-27 ENCOUNTER — Ambulatory Visit: Payer: Self-pay | Admitting: Internal Medicine

## 2014-03-04 ENCOUNTER — Ambulatory Visit: Payer: Self-pay | Admitting: Oncology

## 2014-03-04 ENCOUNTER — Ambulatory Visit: Payer: Self-pay | Admitting: Cardiothoracic Surgery

## 2014-03-20 ENCOUNTER — Ambulatory Visit: Payer: Self-pay | Admitting: Oncology

## 2014-03-20 LAB — CBC CANCER CENTER
BASOS ABS: 0 x10 3/mm (ref 0.0–0.1)
Basophil %: 0.8 %
Eosinophil #: 0.1 x10 3/mm (ref 0.0–0.7)
Eosinophil %: 3.9 %
HCT: 40 % (ref 35.0–47.0)
HGB: 13.2 g/dL (ref 12.0–16.0)
LYMPHS PCT: 32.1 %
Lymphocyte #: 1 x10 3/mm (ref 1.0–3.6)
MCH: 31.8 pg (ref 26.0–34.0)
MCHC: 33 g/dL (ref 32.0–36.0)
MCV: 96 fL (ref 80–100)
MONOS PCT: 8 %
Monocyte #: 0.2 x10 3/mm (ref 0.2–0.9)
NEUTROS PCT: 55.2 %
Neutrophil #: 1.6 x10 3/mm (ref 1.4–6.5)
Platelet: 225 x10 3/mm (ref 150–440)
RBC: 4.15 10*6/uL (ref 3.80–5.20)
RDW: 14.1 % (ref 11.5–14.5)
WBC: 3 x10 3/mm — AB (ref 3.6–11.0)

## 2014-03-20 LAB — COMPREHENSIVE METABOLIC PANEL
ALK PHOS: 81 U/L
ANION GAP: 9 (ref 7–16)
Albumin: 4.1 g/dL (ref 3.4–5.0)
BUN: 10 mg/dL (ref 7–18)
Bilirubin,Total: 0.5 mg/dL (ref 0.2–1.0)
CHLORIDE: 102 mmol/L (ref 98–107)
CO2: 28 mmol/L (ref 21–32)
Calcium, Total: 9.3 mg/dL (ref 8.5–10.1)
Creatinine: 0.94 mg/dL (ref 0.60–1.30)
EGFR (African American): >60
Glucose: 91 mg/dL (ref 65–99)
Osmolality: 276 (ref 275–301)
Potassium: 3.9 mmol/L (ref 3.5–5.1)
SGOT(AST): 36 U/L (ref 15–37)
SGPT (ALT): 38 U/L
Sodium: 139 mmol/L (ref 136–145)
Total Protein: 7.9 g/dL (ref 6.4–8.2)

## 2014-04-03 ENCOUNTER — Ambulatory Visit: Payer: Self-pay | Admitting: Oncology

## 2014-04-03 ENCOUNTER — Ambulatory Visit: Payer: Self-pay | Admitting: Cardiothoracic Surgery

## 2014-04-14 ENCOUNTER — Other Ambulatory Visit: Payer: Self-pay | Admitting: Surgery

## 2014-04-14 DIAGNOSIS — D125 Benign neoplasm of sigmoid colon: Secondary | ICD-10-CM

## 2014-04-14 DIAGNOSIS — Z1211 Encounter for screening for malignant neoplasm of colon: Secondary | ICD-10-CM

## 2014-04-14 DIAGNOSIS — Z800 Family history of malignant neoplasm of digestive organs: Secondary | ICD-10-CM

## 2014-04-22 LAB — COMPREHENSIVE METABOLIC PANEL
ALT: 36 U/L
ANION GAP: 7 (ref 7–16)
AST: 28 U/L (ref 15–37)
Albumin: 3.9 g/dL (ref 3.4–5.0)
Alkaline Phosphatase: 99 U/L
BUN: 16 mg/dL (ref 7–18)
Bilirubin,Total: 0.5 mg/dL (ref 0.2–1.0)
CALCIUM: 9.3 mg/dL (ref 8.5–10.1)
CREATININE: 0.9 mg/dL (ref 0.60–1.30)
Chloride: 102 mmol/L (ref 98–107)
Co2: 31 mmol/L (ref 21–32)
EGFR (African American): >60
EGFR (Non-African Amer.): >60
Glucose: 78 mg/dL (ref 65–99)
Osmolality: 279 (ref 275–301)
Potassium: 3.8 mmol/L (ref 3.5–5.1)
SODIUM: 140 mmol/L (ref 136–145)
Total Protein: 7.8 g/dL (ref 6.4–8.2)

## 2014-04-22 LAB — CBC CANCER CENTER
BASOS ABS: 0 x10 3/mm (ref 0.0–0.1)
Basophil %: 0.8 %
EOS ABS: 0.1 x10 3/mm (ref 0.0–0.7)
EOS PCT: 2.2 %
HCT: 40.8 % (ref 35.0–47.0)
HGB: 13.5 g/dL (ref 12.0–16.0)
LYMPHS ABS: 1.2 x10 3/mm (ref 1.0–3.6)
LYMPHS PCT: 32.5 %
MCH: 31.6 pg (ref 26.0–34.0)
MCHC: 33 g/dL (ref 32.0–36.0)
MCV: 96 fL (ref 80–100)
MONO ABS: 0.3 x10 3/mm (ref 0.2–0.9)
MONOS PCT: 8.6 %
NEUTROS ABS: 2.1 x10 3/mm (ref 1.4–6.5)
NEUTROS PCT: 55.9 %
Platelet: 286 x10 3/mm (ref 150–440)
RBC: 4.26 10*6/uL (ref 3.80–5.20)
RDW: 12.7 % (ref 11.5–14.5)
WBC: 3.7 x10 3/mm (ref 3.6–11.0)

## 2014-05-04 ENCOUNTER — Ambulatory Visit: Payer: Self-pay | Admitting: Oncology

## 2014-05-04 ENCOUNTER — Ambulatory Visit: Payer: Self-pay | Admitting: Cardiothoracic Surgery

## 2014-05-05 ENCOUNTER — Encounter: Payer: Self-pay | Admitting: *Deleted

## 2014-05-13 LAB — COMPREHENSIVE METABOLIC PANEL
Albumin: 3.8 g/dL (ref 3.4–5.0)
Alkaline Phosphatase: 111 U/L
Anion Gap: 6 — ABNORMAL LOW (ref 7–16)
BUN: 19 mg/dL — ABNORMAL HIGH (ref 7–18)
Bilirubin,Total: 0.6 mg/dL (ref 0.2–1.0)
CALCIUM: 9.1 mg/dL (ref 8.5–10.1)
Chloride: 103 mmol/L (ref 98–107)
Co2: 29 mmol/L (ref 21–32)
Creatinine: 0.84 mg/dL (ref 0.60–1.30)
EGFR (African American): >60
Glucose: 107 mg/dL — ABNORMAL HIGH (ref 65–99)
OSMOLALITY: 278 (ref 275–301)
Potassium: 3.9 mmol/L (ref 3.5–5.1)
SGOT(AST): 30 U/L (ref 15–37)
SGPT (ALT): 58 U/L
Sodium: 138 mmol/L (ref 136–145)
Total Protein: 7.9 g/dL (ref 6.4–8.2)

## 2014-05-13 LAB — CBC CANCER CENTER
Basophil #: 0 x10 3/mm (ref 0.0–0.1)
Basophil %: 0.4 %
EOS ABS: 0.2 x10 3/mm (ref 0.0–0.7)
Eosinophil %: 3.3 %
HCT: 43.1 % (ref 35.0–47.0)
HGB: 14.2 g/dL (ref 12.0–16.0)
LYMPHS ABS: 1 x10 3/mm (ref 1.0–3.6)
Lymphocyte %: 15.7 %
MCH: 31.1 pg (ref 26.0–34.0)
MCHC: 33 g/dL (ref 32.0–36.0)
MCV: 94 fL (ref 80–100)
MONOS PCT: 8.9 %
Monocyte #: 0.6 x10 3/mm (ref 0.2–0.9)
NEUTROS ABS: 4.6 x10 3/mm (ref 1.4–6.5)
Neutrophil %: 71.7 %
Platelet: 349 x10 3/mm (ref 150–440)
RBC: 4.57 10*6/uL (ref 3.80–5.20)
RDW: 12.4 % (ref 11.5–14.5)
WBC: 6.4 x10 3/mm (ref 3.6–11.0)

## 2014-06-03 ENCOUNTER — Ambulatory Visit: Payer: Self-pay | Admitting: Oncology

## 2014-06-03 LAB — CBC CANCER CENTER
Basophil #: 0 x10 3/mm (ref 0.0–0.1)
Basophil %: 0.8 %
EOS ABS: 0.1 x10 3/mm (ref 0.0–0.7)
EOS PCT: 2.9 %
HCT: 39 % (ref 35.0–47.0)
HGB: 13 g/dL (ref 12.0–16.0)
LYMPHS ABS: 1.1 x10 3/mm (ref 1.0–3.6)
Lymphocyte %: 28.1 %
MCH: 30.7 pg (ref 26.0–34.0)
MCHC: 33.4 g/dL (ref 32.0–36.0)
MCV: 92 fL (ref 80–100)
Monocyte #: 0.3 x10 3/mm (ref 0.2–0.9)
Monocyte %: 8.1 %
NEUTROS ABS: 2.4 x10 3/mm (ref 1.4–6.5)
NEUTROS PCT: 60.1 %
PLATELETS: 269 x10 3/mm (ref 150–440)
RBC: 4.24 10*6/uL (ref 3.80–5.20)
RDW: 13.3 % (ref 11.5–14.5)
WBC: 4 x10 3/mm (ref 3.6–11.0)

## 2014-06-03 LAB — COMPREHENSIVE METABOLIC PANEL
ALBUMIN: 3.7 g/dL (ref 3.4–5.0)
ALK PHOS: 86 U/L
ALT: 34 U/L
ANION GAP: 8 (ref 7–16)
BUN: 14 mg/dL (ref 7–18)
Bilirubin,Total: 0.4 mg/dL (ref 0.2–1.0)
CALCIUM: 8.7 mg/dL (ref 8.5–10.1)
CHLORIDE: 105 mmol/L (ref 98–107)
CO2: 28 mmol/L (ref 21–32)
CREATININE: 0.68 mg/dL (ref 0.60–1.30)
EGFR (African American): >60
EGFR (Non-African Amer.): >60
Glucose: 88 mg/dL (ref 65–99)
OSMOLALITY: 281 (ref 275–301)
Potassium: 4.1 mmol/L (ref 3.5–5.1)
SGOT(AST): 36 U/L (ref 15–37)
Sodium: 141 mmol/L (ref 136–145)
Total Protein: 7.6 g/dL (ref 6.4–8.2)

## 2014-06-03 LAB — TSH: THYROID STIMULATING HORM: 1.96 u[IU]/mL

## 2014-07-04 ENCOUNTER — Ambulatory Visit: Payer: Self-pay | Admitting: Oncology

## 2014-09-09 ENCOUNTER — Ambulatory Visit
Admit: 2014-09-09 | Disposition: A | Discharge status: Home / Self Care | Payer: Self-pay | Attending: Oncology | Admitting: Oncology

## 2014-09-09 ENCOUNTER — Ambulatory Visit: Payer: Self-pay | Admitting: Oncology

## 2014-09-11 ENCOUNTER — Ambulatory Visit: Payer: Self-pay | Admitting: Internal Medicine

## 2014-10-03 ENCOUNTER — Ambulatory Visit
Admit: 2014-10-03 | Disposition: A | Discharge status: Home / Self Care | Payer: Self-pay | Attending: Oncology | Admitting: Oncology

## 2014-10-17 ENCOUNTER — Other Ambulatory Visit: Payer: Self-pay | Admitting: Oncology

## 2014-10-17 DIAGNOSIS — C3400 Malignant neoplasm of unspecified main bronchus: Secondary | ICD-10-CM

## 2014-10-24 NOTE — Op Note (Signed)
PATIENT NAME:  Lauren Page, Lauren Page MR#:  [id number] DATE OF BIRTH:  22-Jun-1951  DATE OF PROCEDURE:  02/06/2013  PREOPERATIVE DIAGNOSIS: Internal derangement of the right knee.   POSTOPERATIVE DIAGNOSES:  1. Tear of the anterior and posterior horns of the medial meniscus, right knee.  2. Tear of the anterior and posterior horns of the lateral meniscus, right knee.   PROCEDURE PERFORMED: Right knee arthroscopy, partial medial and lateral meniscectomies.   SURGEON: Laurice Record. Holley Bouche., M.D.   ANESTHESIA: General.   ESTIMATED BLOOD LOSS: Minimal.   TOURNIQUET TIME: Not used.   DRAINS: None.   INDICATIONS FOR SURGERY: The patient is a 64 year old female who has been seen for complaints of progressive right knee pain. MRI demonstrated findings consistent with meniscal pathology. After discussion of the risks and benefits of surgical intervention, the patient expressed understanding of the risks and benefits and agreed with plans for surgical intervention.   PROCEDURE IN DETAIL: The patient was brought into the operating room and, after adequate general anesthesia was achieved, a tourniquet was placed on the patient's right thigh and the leg was placed in a leg holder. All bony prominences were well padded. The patient's right knee and leg were cleaned and prepped with alcohol and DuraPrep and draped in the usual sterile fashion. A "timeout" was performed as per usual protocol. The anticipated portal sites were injected with 0.25% Marcaine with epinephrine. An anterolateral portal was created, and a cannula was inserted. The scope was inserted, and the knee was distended with fluid using the Stryker pump. The scope was advanced down the medial gutter into the medial compartment of the knee. Articular surface was in good condition. There was a small degenerative tear of the posterior horn of the medial meniscus. This was debrided using meniscal punches and a 4.5 mm shaver. Final contouring was performed  using the ArthroCare wand. Inspection of the anterior horn demonstrated a complex tear with a horizontal cleavage component. The tear of the anterior horn was debrided using a 4.5 mm shaver and subsequently contouring with the 50-degree ArthroCare wand. The medial meniscus was subsequently visualized and probed and felt to be stable. The scope was then advanced into the intercondylar region. Anterior cruciate ligament was visualized and probed and felt to be stable. The scope was removed from the anterolateral portal and reinserted via the anteromedial portal so as to better visualize the lateral compartment. Complex degenerative tear was noted to the anterior horn of the lateral meniscus. This was debrided using a combination of 4.5 mm shaver and subsequent debridement and contouring using the 50-degree ArthroCare wand. The extent of the tear was from its anterior attachment laterally to the onset of the posterior horn. The remnant of the lateral meniscus was visualized and probed and felt to be stable. Only mild chondral changes were noted. Finally, the scope was positioned so as to visualize the patellofemoral articulation. Good patellar tracking was noted. The articular surface was in good condition.   The knee was irrigated with copious amounts of fluid and then suctioned dry. The anterolateral portal was reapproximated using #3-0 nylon. A combination of 0.25% Marcaine with epinephrine and 4 mg of morphine was injected via the scope. The scope was removed, and the anteromedial portal was reapproximated using #3-0 nylon. A sterile dressing was applied followed by application of ice wrap.   The patient tolerated the procedure well. She was transported to the recovery room in stable condition.   ____________________________ Laurice Record. Holley Bouche., MD jph:gb  D: 02/07/2013 00:30:00 ET T: 02/07/2013 03:21:15 ET JOB#: [id number]  cc: Jeneen Rinks P. Holley Bouche., MD, <Dictator> JAMES P Holley Bouche MD ELECTRONICALLY  SIGNED 02/10/2013 17:17

## 2014-10-25 NOTE — Discharge Summary (Signed)
PATIENT NAME:  Lauren Page, Lauren Page MR#:  [id number] DATE OF BIRTH:  April 09, 1951  DATE OF ADMISSION:  10/30/2013 DATE OF DISCHARGE:  11/05/2013  ADMITTING DIAGNOSIS: Right middle lobe mass (biopsy consistent with adenocarcinoma).   DISCHARGE DIAGNOSIS: Right middle lobe mass (biopsy consistent with adenocarcinoma).   OPERATION PERFORMED: Right thoracotomy with right middle lobectomy.   HOSPITAL COURSE: Ms. Lauren Page is a 64 year old nonsmoker, who was recently identified to have a right middle lobe infiltrate. She underwent a course of antibiotic therapy, and repeat films showed persistent right middle lobe infiltrate. Complete evaluation, including percutaneous biopsy, was consistent with adenocarcinoma. The patient was apprised of the indications and risks of surgery and was brought into the hospital on the 29th of April, and she underwent a muscle-sparing right middle lobe resection. At the time of the surgical procedure, the tumor was confined to only the middle lobe, without involvement of the upper or lower lobes. 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 One hilar lymph node was positive for tumor as was the pleural invasion. The patient's postoperative course was essentially unremarkable. She had her chest tubes removed as well as her subcutaneous Blake drain. At the time of discharge, her lung was completely expanded, with only a very small pleural effusion.    DISCHARGE MEDICATIONS: Included Premarin, calcium, Advair, Flonase, multivitamins, Prevacid, Demerol and acetaminophen. She desired Demerol for pain management, and she was given a prescription for 50 mg of Demerol to be taken every 3 to 4 hours as needed.   FOLLOWUP: She will follow up with Dr. Genevive Bi and Dr. Oliva Bustard in the [place] in 1 week.    ____________________________ Lew Dawes. Genevive Bi, MD teo:lb D: 11/13/2013 10:57:00 ET T: 11/13/2013 11:02:54 ET JOB#: [id number]  cc: Christia Reading E. Genevive Bi,  MD, <Dictator> Leonie Douglas. Doy Hutching, MD

## 2014-10-25 NOTE — Discharge Summary (Signed)
PATIENT NAME:  Lauren Page, Lauren Page MR#:  [id number] DATE OF BIRTH:  07/24/1950  DATE OF ADMISSION:  10/30/2013 DATE OF DISCHARGE:  11/05/2013  ADMITTING DIAGNOSIS: Right middle lobe mass (biopsy consistent with adenocarcinoma).   DISCHARGE DIAGNOSIS: Right middle lobe mass (biopsy consistent with adenocarcinoma).   OPERATION PERFORMED: Right thoracotomy with right middle lobectomy.   HOSPITAL COURSE: Lauren Page is a 64 year old nonsmoker, who was recently identified to have a right middle lobe infiltrate. She underwent a course of antibiotic therapy, and repeat films showed persistent right middle lobe infiltrate. Complete evaluation, including percutaneous biopsy, was consistent with adenocarcinoma. The patient was apprised of the indications and risks of surgery and was brought into the hospital on the 29th of April, and she underwent a muscle-sparing right middle lobe resection. At the time of the surgical procedure, the tumor was confined to only the middle lobe, without involvement of the upper or lower lobes. 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 One hilar lymph node was positive for tumor as was the pleural invasion. The patient's postoperative course was essentially unremarkable. She had her chest tubes removed as well as her subcutaneous Blake drain. At the time of discharge, her lung was completely expanded, with only a very small pleural effusion.    DISCHARGE MEDICATIONS: Included Premarin, calcium, Advair, Flonase, multivitamins, Prevacid, Demerol and acetaminophen. She desired Demerol for pain management, and she was given a prescription for 50 mg of Demerol to be taken every 3 to 4 hours as needed.   FOLLOWUP: She will follow up with Dr. Genevive Bi and Dr. Oliva Bustard in the [place] in 1 week.    ____________________________ Lew Dawes. Genevive Bi, MD teo:lb D: 11/13/2013 10:57:00 ET T: 11/13/2013 11:02:54 ET JOB#: [id number]  cc: Leonie Douglas.  Doy Hutching, MD Lew Dawes Genevive Bi, MD, <Dictator>  Louis Matte MD ELECTRONICALLY SIGNED 11/20/2013 9:46

## 2014-10-25 NOTE — Op Note (Signed)
PATIENT NAME:  Lauren Page, Lauren Page MR#:  [id number] DATE OF BIRTH:  1951-06-23  DATE OF PROCEDURE:  10/30/2013  SURGEON: Louis Matte, M.D.   ASSISTANT: Chauncey Reading, M.D.   PREOPERATIVE DIAGNOSIS: Carcinoma of the right middle lobe.   POSTOPERATIVE DIAGNOSIS: Carcinoma of the right middle lobe.   OPERATION PERFORMED: 1. Preoperative bronchoscopy to assess endobronchial anatomy.  2. Right thoracotomy with right middle lobectomy.   INDICATIONS FOR PROCEDURE: Lauren Page is a 64 year old woman with recent diagnosis of a right middle lobe adenocarcinoma. She was apprised of the indications and risks of the above-named procedure and she gave her informed consent.   DESCRIPTION OF PROCEDURE: The patient was brought to the operating suite and placed in the supine position. General endotracheal anesthesia was given with a double-lumen tube. Preoperative bronchoscopy was carried out. There was no evidence of any endobronchial tumor. There was no evidence of any purulent secretions. The patient was then turned for a right thoracotomy. All pressure points were carefully padded. The patient was prepped and draped in the usual sterile fashion. A muscle-sparing posterolateral thoracotomy was performed. The chest was entered. Palpation of the lung revealed an obvious mass within the right middle lobe. The right middle lobe was almost nearly separate from the upper and lower lobes and the minor fissure did require completion with a stapler. There were two large arterial branches and two venous branches to the middle lobe. These branches were taken with a vascular stapler. One of the smaller middle lobe veins was taken with ties and clips. The bronchus was transected with a TX stapling device. The middle and lower lobes ventilated nicely before the stapler was fired and the specimen was passed off the field. We then turned our attention to the pretracheal space. The subcarinal space in the inferior pulmonary ligament.  The inferior pulmonary ligament was divided. Both the pretracheal space, the subcarinal space and the paraesophageal space were all searched for lymph nodes and none could be identified. At this point, we copiously irrigated the chest and checked the bronchial stump at 30 cm of water pressure. There was no air leak. Progel was then used on the dissection areas. Two chest tubes were inserted in standard fashion.  An angled and a straight tube were positioned. The chest was then closed with number #2 Vicryl paracostal sutures. The muscles of the chest wall were allowed to return to their normal anatomic position. A 15 mm Blake drain was placed in the subcutaneous tissues. The Scarpa's fascia was closed with running 2-0 Vicryl and finally the skin was closed with skin clips. The thoracoscopy port and access port was closed with interrupted Vicryl and with nylon. The patient tolerated the procedure well and was rolled in the supine position where she was extubated and taken to the recovery room in stable condition.    ____________________________ Lauren Dawes Genevive Bi, MD teo:sg D: 10/31/2013 30:86:57 ET T: 10/31/2013 07:28:20 ET JOB#: [id number]  cc: Christia Reading E. Genevive Bi, MD, <Dictator> Leonie Douglas. Doy Hutching, MD   Louis Matte MD ELECTRONICALLY SIGNED 11/01/2013 16:00

## 2014-10-25 NOTE — Consult Note (Signed)
PATIENT NAME:  Lauren Page, FIGUERO MR#:  [id number] DATE OF BIRTH:  [birth year]  DATE OF CONSULTATION:  10/30/2013  REFERRING PHYSICIAN:  Timothy E. Genevive Bi, MD CONSULTING PHYSICIAN:  Marcelene Weidemann R. Rieley Khalsa, MD  REASON FOR CONSULTATION: Chronic obstructive pulmonary disease, status post thoracotomy for adenocarcinoma of the lung.   HISTORY OF PRESENTING ILLNESS: A 64 year old Caucasian female patient with history of right middle lobe adenocarcinoma of the lung, is status post thoracotomy with a chest tube, admitted to Critical Care Unit. The patient presently complains of significant pain at the surgical site. She does have an epidural infusion pump for pain control. The patient is also on phenylephrine for blood pressure to be kept with MAP over 65. The patient does not have any nausea or vomiting, states she feels a little dizzy and weak.   The patient had a chest x-ray and subsequent CT scan which revealed initially a possible lesion of right middle lobe consistent with slow-growing adenocarcinoma. Had a CT-guided needle biopsy which confirmed the adenocarcinoma. She also had a pulmonary function test with chronic obstructive pulmonary disease. The patient never smoked. The patient today had a thoracotomy for this right middle lobe mass.   PAST MEDICAL HISTORY: 1.  Chronic obstructive pulmonary disease.  2.  Past medical history of right-sided lung adenocarcinoma.  3.  Hysterectomy.  4.  Knee arthroscopies.  5.  Gastroesophageal reflux disease.   SOCIAL HISTORY: The patient is married, never smoked. She does drink approximately 10 to 14 alcoholic drinks per week.   FAMILY HISTORY: Positive for colon cancer in her father. Mother deceased of chronic obstructive pulmonary disease. Father also had heart disease.   ALLERGIES: PENICILLIN AND SULFA.   REVIEW OF SYSTEMS: CONSTITUTIONAL: Complains of fatigue, weakness.  EYES:  No blurred vision, pain, redness. EARS, NOSE AND THROAT:  No tinnitus, pain, hearing  loss.   RESPIRATORY: Has some shortness of breath, chest pain.  CARDIOVASCULAR: No edema or orthopnea.  GASTROINTESTINAL: Mild nausea. No vomiting, no abdominal pain.  GENITOURINARY: No dysuria, hematuria, frequency.   ENDOCRINE:  No polyuria, nocturia, heat or cold intolerance.   HEMATOLOGIC AND LYMPHATIC: No anemia, easy bruising, bleeding.   INTEGUMENTARY:  No acne, rash, lesion.  MUSCULOSKELETAL: Does have back pain.  NEUROLOGIC: No focal numbness, weakness, seizures. Has generalized weakness.   HOME MEDICATIONS: 1.  Advair Diskus 250/50 one puff inhaled once a day.  2.  Calcium vitamin D 2 tablets daily.  3.  Flonase 2 doses nasal once a day.  4.  Multivitamin 1 tablet daily.  5.  Premarin 0.625 mg oral once a day.  6.  Prevacid 30 mg oral once a day.   PHYSICAL EXAMINATION: VITAL SIGNS: Temperature 97.7, pulse 74, blood pressure of 86/50, saturating 95% on 2 liters oxygen.  GENERAL: Moderately-built Caucasian female patient lying in bed, seems in significant distress secondary to the pain.  PSYCHIATRIC: Alert and oriented x 3, anxious.  HEENT: Normocephalic, atraumatic.  Mucosa moist and pink. External ears and nose normal. No pallor or icterus.  Pupils bilaterally equal and reactive to light.  NECK: Supple, no thyromegaly, no palpable lymph nodes.  Trachea midline, no carotid bruit or JVD. CARDIOVASCULAR: S1, S2 without any murmurs. Peripheral pulses 2+. No edema.  RESPIRATORY: Has good air entry on both sides. Has a right-sided chest tube along with a drain. Significant tenderness. No frank bleeding.  GASTROINTESTINAL: Soft abdomen, nontender. Bowel sounds present. No hepatosplenomegaly palpable.    GENITOURINARY: No significant bladder distention.  SKIN: Warm and dry.  No petechiae, rash. Has wounds from surgery.  MUSCULOSKELETAL: No joint swelling, redness or effusion of large joints. Normal muscle tone. Does have an epidural site for the epidural infusion pump.  NEUROLOGICAL:  Motor strength 5 out of 5 in upper and lower extremities. Sensation  intact all over.  LYMPHATIC: No cervical lymphadenopathy.   LABORATORY STUDIES: None.    IMAGING STUDIES: None.   ASSESSMENT AND PLAN: 1.  Right-sided lung adenocarcinoma, status post thoracotomy. Management per Dr. Genevive Bi. The patient is on continuous infusion pump for pain control which is being continued. Presently, is on phenylephrine drip to keep her mean aortic pressure over 65. She has hypotension, likely secondary from the medications. Should also consider acute blood loss causing hypotension. We will check hemoglobin.  2.  Chronic obstructive pulmonary disease. DuoNeb p.r.n., continue home medications.  3.  Deep venous thrombosis prophylaxis with thromboembolic disease stockings and sequential compression devices.  4.  CODE STATUS: Full code.   TIME SPENT TODAY ON THIS CASE: Was 40 minutes.   ____________________________ Leia Alf Emiley Digiacomo, MD srs:cs D: 10/30/2013 14:28:00 ET T: 10/30/2013 19:32:24 ET JOB#: [id number]  cc: Alveta Heimlich R. Valeriano Bain, MD, <Dictator> Neita Carp MD ELECTRONICALLY SIGNED 11/29/2013 14:11

## 2015-01-07 ENCOUNTER — Other Ambulatory Visit: Payer: Self-pay | Admitting: Internal Medicine

## 2015-01-07 DIAGNOSIS — N644 Mastodynia: Secondary | ICD-10-CM

## 2015-01-07 DIAGNOSIS — N63 Unspecified lump in unspecified breast: Secondary | ICD-10-CM

## 2015-01-08 ENCOUNTER — Other Ambulatory Visit: Payer: Self-pay | Admitting: Internal Medicine

## 2015-01-08 ENCOUNTER — Ambulatory Visit
Admission: RE | Admit: 2015-01-08 | Discharge: 2015-01-08 | Disposition: A | Discharge status: Home / Self Care | Payer: 59 | Source: Ambulatory Visit | Attending: Internal Medicine | Admitting: Internal Medicine

## 2015-01-08 DIAGNOSIS — N63 Unspecified lump in unspecified breast: Secondary | ICD-10-CM

## 2015-01-08 DIAGNOSIS — N644 Mastodynia: Secondary | ICD-10-CM | POA: Diagnosis not present

## 2015-02-12 ENCOUNTER — Other Ambulatory Visit: Payer: Self-pay | Admitting: *Deleted

## 2015-02-12 DIAGNOSIS — C3490 Malignant neoplasm of unspecified part of unspecified bronchus or lung: Secondary | ICD-10-CM

## 2015-03-10 ENCOUNTER — Other Ambulatory Visit: Payer: Self-pay | Admitting: *Deleted

## 2015-03-10 ENCOUNTER — Encounter
Admission: RE | Admit: 2015-03-10 | Discharge: 2015-03-10 | Disposition: A | Discharge status: Home / Self Care | Payer: 59 | Source: Ambulatory Visit | Attending: Oncology | Admitting: Oncology

## 2015-03-10 DIAGNOSIS — Z9221 Personal history of antineoplastic chemotherapy: Secondary | ICD-10-CM | POA: Diagnosis not present

## 2015-03-10 DIAGNOSIS — C3490 Malignant neoplasm of unspecified part of unspecified bronchus or lung: Secondary | ICD-10-CM

## 2015-03-10 DIAGNOSIS — R911 Solitary pulmonary nodule: Secondary | ICD-10-CM | POA: Insufficient documentation

## 2015-03-10 DIAGNOSIS — Z902 Acquired absence of lung [part of]: Secondary | ICD-10-CM | POA: Diagnosis not present

## 2015-03-10 DIAGNOSIS — C3400 Malignant neoplasm of unspecified main bronchus: Secondary | ICD-10-CM

## 2015-03-10 LAB — GLUCOSE, CAPILLARY: GLUCOSE-CAPILLARY: 72 mg/dL (ref 65–99)

## 2015-03-10 MED ORDER — FLUDEOXYGLUCOSE F - 18 (FDG) INJECTION
11.4200 | Freq: Once | INTRAVENOUS | Status: DC | PRN
  Administered 2015-03-10: 11.42 via INTRAVENOUS
  Filled 2015-03-10: qty 11.42

## 2015-03-12 ENCOUNTER — Inpatient Hospital Stay (HOSPITAL_BASED_OUTPATIENT_CLINIC_OR_DEPARTMENT_OTHER): Payer: 59 | Admitting: Oncology

## 2015-03-12 ENCOUNTER — Encounter: Payer: Self-pay | Admitting: Oncology

## 2015-03-12 ENCOUNTER — Inpatient Hospital Stay: Payer: 59 | Attending: Oncology

## 2015-03-12 VITALS — BP 135/79 | HR 58 | Temp 96.0°F | Wt 131.5 lb

## 2015-03-12 DIAGNOSIS — Z9221 Personal history of antineoplastic chemotherapy: Secondary | ICD-10-CM | POA: Diagnosis not present

## 2015-03-12 DIAGNOSIS — N63 Unspecified lump in unspecified breast: Secondary | ICD-10-CM | POA: Insufficient documentation

## 2015-03-12 DIAGNOSIS — J309 Allergic rhinitis, unspecified: Secondary | ICD-10-CM | POA: Insufficient documentation

## 2015-03-12 DIAGNOSIS — Z86010 Personal history of colonic polyps: Secondary | ICD-10-CM | POA: Insufficient documentation

## 2015-03-12 DIAGNOSIS — Z79899 Other long term (current) drug therapy: Secondary | ICD-10-CM | POA: Insufficient documentation

## 2015-03-12 DIAGNOSIS — I341 Nonrheumatic mitral (valve) prolapse: Secondary | ICD-10-CM | POA: Insufficient documentation

## 2015-03-12 DIAGNOSIS — C3490 Malignant neoplasm of unspecified part of unspecified bronchus or lung: Secondary | ICD-10-CM

## 2015-03-12 DIAGNOSIS — K219 Gastro-esophageal reflux disease without esophagitis: Secondary | ICD-10-CM | POA: Insufficient documentation

## 2015-03-12 DIAGNOSIS — J45909 Unspecified asthma, uncomplicated: Secondary | ICD-10-CM | POA: Diagnosis not present

## 2015-03-12 DIAGNOSIS — C342 Malignant neoplasm of middle lobe, bronchus or lung: Secondary | ICD-10-CM | POA: Insufficient documentation

## 2015-03-12 DIAGNOSIS — R490 Dysphonia: Secondary | ICD-10-CM | POA: Insufficient documentation

## 2015-03-12 LAB — CBC WITH DIFFERENTIAL/PLATELET
Basophils Absolute: 0 10*3/uL (ref 0–0.1)
Basophils Relative: 1 %
EOS PCT: 1 %
Eosinophils Absolute: 0.1 10*3/uL (ref 0–0.7)
HCT: 38.9 % (ref 35.0–47.0)
Hemoglobin: 13.4 g/dL (ref 12.0–16.0)
LYMPHS ABS: 1.1 10*3/uL (ref 1.0–3.6)
Lymphocytes Relative: 26 %
MCH: 30.6 pg (ref 26.0–34.0)
MCHC: 34.5 g/dL (ref 32.0–36.0)
MCV: 88.9 fL (ref 80.0–100.0)
Monocytes Absolute: 0.3 10*3/uL (ref 0.2–0.9)
Monocytes Relative: 7 %
Neutro Abs: 2.7 10*3/uL (ref 1.4–6.5)
Neutrophils Relative %: 65 %
PLATELETS: 271 10*3/uL (ref 150–440)
RBC: 4.38 MIL/uL (ref 3.80–5.20)
RDW: 13.7 % (ref 11.5–14.5)
WBC: 4.2 10*3/uL (ref 3.6–11.0)

## 2015-03-12 LAB — COMPREHENSIVE METABOLIC PANEL
ALBUMIN: 4 g/dL (ref 3.5–5.0)
ALT: 22 U/L (ref 14–54)
ANION GAP: 6 (ref 5–15)
AST: 29 U/L (ref 15–41)
Alkaline Phosphatase: 67 U/L (ref 38–126)
BILIRUBIN TOTAL: 0.6 mg/dL (ref 0.3–1.2)
BUN: 14 mg/dL (ref 6–20)
CALCIUM: 8.4 mg/dL — AB (ref 8.9–10.3)
CO2: 23 mmol/L (ref 22–32)
Chloride: 104 mmol/L (ref 101–111)
Creatinine, Ser: 0.82 mg/dL (ref 0.44–1.00)
Glucose, Bld: 111 mg/dL — ABNORMAL HIGH (ref 65–99)
POTASSIUM: 4.2 mmol/L (ref 3.5–5.1)
Sodium: 133 mmol/L — ABNORMAL LOW (ref 135–145)
TOTAL PROTEIN: 7.2 g/dL (ref 6.5–8.1)

## 2015-03-12 NOTE — Progress Notes (Signed)
Patient does not have living will.  Never smoked.

## 2015-03-13 LAB — VITAMIN D 25 HYDROXY (VIT D DEFICIENCY, FRACTURES): VIT D 25 HYDROXY: 73 ng/mL (ref 30.0–100.0)

## 2015-03-14 NOTE — Progress Notes (Signed)
[place] @ [facility] Telephone:(336) [phone]  Fax:(336) [place]: 24-Mar-1951  MR#: [id number]  ZDG#:[id number]  No care team member to display  CHIEF COMPLAINT:  Chief Complaint  Patient presents with  . Follow-up   Chief Complaint/Diagnosis:   1..A right middle lobe adenocarcinoma.  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  2.EGFR mutation present 3.started on adjuvant chemotherapy with cis-platinum and Alimta .  may of 2015 4.Patient has finished 4 cycles of chemotherapy on January 23, 2014 Now patient would be randomized   to  our [facility] protocol for Tarceva vs. placebo because of EGFR mutation 3.patient desires to get off protocol because of progressive side effect even after reducing dose.  (May 13, 2014) 5.Patient is starting  maintenance investigationally approach with Tarceva vs. placebo September of 2015. 6.Patient came off protocol therapy because of significant GI side effect May 13, 2014     INTERVAL HISTORY: 64 year old lady came today further follow-up regarding carcinoma of lung.  Patient had a PET scan for restaging workup.  No chills.  No fever.  No nausea.  No cough.  No chest pain.  Patient does not smoke.  He has not lost any weight.  REVIEW OF SYSTEMS:   GENERAL:  Feels good.  Active.  No fevers, sweats or weight loss. PERFORMANCE STATUS (ECOG): 0 HEENT:  No visual changes, runny nose, sore throat, mouth sores or tenderness. Lungs: No shortness of breath or cough.  No hemoptysis. Cardiac:  No chest pain, palpitations, orthopnea, or PND. GI:  No nausea, vomiting, diarrhea, constipation, melena or hematochezia. GU:  No urgency, frequency, dysuria, or hematuria. Musculoskeletal:  No back pain.  No joint pain.  No muscle tenderness. Extremities:  No pain or swelling. Skin:  No rashes or skin changes. Neuro:  No headache, numbness or weakness,  balance or coordination issues. Endocrine:  No diabetes, thyroid issues, hot flashes or night sweats. Psych:  No mood changes, depression or anxiety. Pain:  No focal pain. Review of systems:  All other systems reviewed and found to be negative. As per HPI. Otherwise, a complete review of systems is negatve.  PAST MEDICAL HISTORY: Past Medical History  Diagnosis Date  . Mitral valve prolapse   . Acid reflux   . Asthma   . Cancer 2015    lung cancer - chemo    PAST SURGICAL HISTORY: Past Surgical History  Procedure Laterality Date  . Colonoscopy  2008     Dr. Donnella Sham  . Upper gi endoscopy  2008  . Knee surgery  2007  . Breast biopsy Left 1994 (-), 2004 (-)    FAMILY HISTORY Family History  Problem Relation Age of Onset  . Breast cancer Maternal Aunt   . Breast cancer Paternal Aunt   . Breast cancer Maternal Grandmother   . Breast cancer Maternal Aunt   . Breast cancer Paternal Aunt     ADVANCED DIRECTIVES:  Patient does not have any living will or healthcare power of attorney.  Information was given .  Available resources had been discussed.  We will follow-up on subsequent appointments regarding this issue HEALTH MAINTENANCE: Social History  Substance Use Topics  . Smoking status: Never Smoker   . Smokeless tobacco: None  . Alcohol Use: Yes      Allergies  Allergen Reactions  . Sulfa Antibiotics Nausea And Vomiting  . Penicillins Swelling and Rash  Current Outpatient Prescriptions  Medication Sig Dispense Refill  . albuterol (PROAIR HFA) 108 (90 BASE) MCG/ACT inhaler INHALE 2 PUFFS AS DIRECTED AS NEEDED    . Biotin 800 MCG TABS Take by mouth.    . estradiol (ESTRACE) 0.5 MG tablet Take by mouth.    . estrogens, conjugated, (PREMARIN) 0.625 MG tablet Take 0.625 mg by mouth as needed. Take daily for 21 days then do not take for 7 days.    Marland Kitchen etodolac (LODINE) 500 MG tablet Take 500 mg by mouth 2 (two) times daily.    . fexofenadine (ALLEGRA) 180 MG tablet  Take by mouth.    . fluticasone (FLONASE) 50 MCG/ACT nasal spray USE 2 SPRAYS IN EACH NOSTRIL AT BEDTIME    . Fluticasone-Salmeterol (ADVAIR DISKUS) 250-50 MCG/DOSE AEPB INHALE ONE PUFF INTO THE LUNGS 2 TIMES DAILY.    Marland Kitchen Fluticasone-Salmeterol (ADVAIR) 100-50 MCG/DOSE AEPB Inhale 1 puff into the lungs every 12 (twelve) hours.    . lansoprazole (PREVACID) 30 MG capsule Take 30 mg by mouth daily.    . lansoprazole (PREVACID) 30 MG capsule Take by mouth.    . Vitamin D, Ergocalciferol, (DRISDOL) 50000 UNITS CAPS capsule Take by mouth.     No current facility-administered medications for this visit.   Facility-Administered Medications Ordered in Other Visits  Medication Dose Route Frequency Provider Last Rate Last Dose  . fludeoxyglucose F - 18 (FDG) injection 11.42 milli Curie  11.42 milli Curie Intravenous Once PRN Medication Radiologist, MD   11.42 milli Curie at 03/10/15 1302    OBJECTIVE:  Filed Vitals:   03/12/15 1147  BP: 135/79  Pulse: 58  Temp: 96 F (35.6 C)     Body mass index is 22.93 kg/(m^2).    ECOG FS:0 - Asymptomatic  PHYSICAL EXAM: GENERAL:  Well developed, well nourished, sitting comfortably in the exam room in no acute distress. MENTAL STATUS:  Alert and oriented to person, place and time. . ENT:  Oropharynx clear without lesion.  Tongue normal. Mucous membranes moist.  RESPIRATORY:  Clear to auscultation without rales, wheezes or rhonchi. CARDIOVASCULAR:  Regular rate and rhythm without murmur, rub or gallop.  ABDOMEN:  Soft, non-tender, with active bowel sounds, and no hepatosplenomegaly.  No masses. BACK:  No CVA tenderness.  No tenderness on percussion of the back or rib cage. SKIN:  No rashes, ulcers or lesions. EXTREMITIES: No edema, no skin discoloration or tenderness.  No palpable cords. LYMPH NODES: No palpable cervical, supraclavicular, axillary or inguinal adenopathy  NEUROLOGICAL: Unremarkable. PSYCH:  Appropriate.   LAB RESULTS:  CBC Latest Ref  Rng 03/12/2015 06/03/2014  WBC 3.6 - 11.0 K/uL 4.2 4.0  Hemoglobin 12.0 - 16.0 g/dL 13.4 13.0  Hematocrit 35.0 - 47.0 % 38.9 39.0  Platelets 150 - 440 K/uL 271 269    Appointment on 03/12/2015  Component Date Value Ref Range Status  . Vit D, 25-Hydroxy 03/12/2015 73.0  30.0 - 100.0 ng/mL Final   Comment: (NOTE) Vitamin D deficiency has been defined by the [place] practice guideline as a level of serum 25-OH vitamin D less than 20 ng/mL (1,2). The Endocrine Society went on to further define vitamin D insufficiency as a level between 21 and 29 ng/mL (2). 1. IOM (Institute of Medicine). 2010. Dietary reference   intakes for calcium and D. [place]: The   [employer]. 2. Holick MF, Binkley [place], Bischoff-Ferrari HA, et al.   Evaluation, treatment, and prevention of vitamin D  deficiency: an Endocrine Society clinical practice   guideline. JCEM. 2011 Jul; 96(7):1911-30. Performed At: [facility] [place], [place] [id number] Lindon Romp MD [id number]   . WBC 03/12/2015 4.2  3.6 - 11.0 K/uL Final  . RBC 03/12/2015 4.38  3.80 - 5.20 MIL/uL Final  . Hemoglobin 03/12/2015 13.4  12.0 - 16.0 g/dL Final  . HCT 03/12/2015 38.9  35.0 - 47.0 % Final  . MCV 03/12/2015 88.9  80.0 - 100.0 fL Final  . MCH 03/12/2015 30.6  26.0 - 34.0 pg Final  . MCHC 03/12/2015 34.5  32.0 - 36.0 g/dL Final  . RDW 03/12/2015 13.7  11.5 - 14.5 % Final  . Platelets 03/12/2015 271  150 - 440 K/uL Final  . Neutrophils Relative % 03/12/2015 65   Final  . Neutro Abs 03/12/2015 2.7  1.4 - 6.5 K/uL Final  . Lymphocytes Relative 03/12/2015 26   Final  . Lymphs Abs 03/12/2015 1.1  1.0 - 3.6 K/uL Final  . Monocytes Relative 03/12/2015 7   Final  . Monocytes Absolute 03/12/2015 0.3  0.2 - 0.9 K/uL Final  . Eosinophils Relative 03/12/2015 1   Final  . Eosinophils Absolute 03/12/2015 0.1  0 - 0.7 K/uL Final  . Basophils Relative 03/12/2015 1    Final  . Basophils Absolute 03/12/2015 0.0  0 - 0.1 K/uL Final  . Sodium 03/12/2015 133* 135 - 145 mmol/L Final  . Potassium 03/12/2015 4.2  3.5 - 5.1 mmol/L Final  . Chloride 03/12/2015 104  101 - 111 mmol/L Final  . CO2 03/12/2015 23  22 - 32 mmol/L Final  . Glucose, Bld 03/12/2015 111* 65 - 99 mg/dL Final  . BUN 03/12/2015 14  6 - 20 mg/dL Final  . Creatinine, Ser 03/12/2015 0.82  0.44 - 1.00 mg/dL Final  . Calcium 03/12/2015 8.4* 8.9 - 10.3 mg/dL Final  . Total Protein 03/12/2015 7.2  6.5 - 8.1 g/dL Final  . Albumin 03/12/2015 4.0  3.5 - 5.0 g/dL Final  . AST 03/12/2015 29  15 - 41 U/L Final  . ALT 03/12/2015 22  14 - 54 U/L Final  . Alkaline Phosphatase 03/12/2015 67  38 - 126 U/L Final  . Total Bilirubin 03/12/2015 0.6  0.3 - 1.2 mg/dL Final  . GFR calc non Af Amer 03/12/2015 >60  >60 mL/min Final  . GFR calc Af Amer 03/12/2015 >60  >60 mL/min Final   Comment: (NOTE) The eGFR has been calculated using the CKD EPI equation. This calculation has not been validated in all clinical situations. eGFR's persistently <60 mL/min signify possible Chronic Kidney Disease.   Georgiann Hahn gap 03/12/2015 6  5 - 15 Final  Hospital Outpatient Visit on 03/10/2015  Component Date Value Ref Range Status  . Glucose-Capillary 03/10/2015 72  65 - 99 mg/dL Final       STUDIES: Nm Pet Image Restag (ps) Skull Base To Thigh  03/12/2015   CLINICAL DATA:  Subsequent treatment strategy for lung cancer restaging. Last chemotherapy in August of 2015.  EXAM: NUCLEAR MEDICINE PET SKULL BASE TO THIGH  TECHNIQUE: 11.4 mCi F-18 FDG was injected intravenously. Full-ring PET imaging was performed from the skull base to thigh after the radiotracer. CT data was obtained and used for attenuation correction and anatomic localization.  FASTING BLOOD GLUCOSE:  Value: 72 mg/dl  COMPARISON:  Chest and abdomen CT of 09/09/2014. Prior PET of 03/20/2014.  FINDINGS: NECK  No areas of abnormal hypermetabolism.  CHEST  No  areas of  abnormal hypermetabolism.  ABDOMEN/PELVIS  No areas of abnormal hypermetabolism.  SKELETON  No abnormal marrow activity.  CT IMAGES PERFORMED FOR ATTENUATION CORRECTION  No cervical adenopathy. Mildly tortuous thoracic aorta. Mild cardiomegaly. Surgical changes of right middle lobectomy. No thoracic adenopathy. A tiny nodule along the right minor fissure on image 87 is unchanged and may represent a subpleural lymph node. Normal adrenal glands. Colonic stool burden suggests constipation. Dystrophic calcification in the right ovary. Hysterectomy.  IMPRESSION: 1. Status post right middle lobectomy, without recurrent or metastatic disease. 2. Similar appearance of tiny nodule along the right minor fissure, favoring a subpleural lymph node. 3.  Possible constipation.   Electronically Signed   By: Abigail Miyamoto M.D.   On: 03/12/2015 10:28    ASSESSMENT: Carcinoma  of lung, adenocarcinoma EGFR mutated status post resection and adjuvant chemotherapy followed by Tarceva under the clinical trial  MEDICAL DECISION MAKING:  PET scan has been reviewed independently and reviewed the patient no evidence of recurrent disease.  On clinical examination revealing lab data there is no evidence of recurrent disease at her appointment as per protocol  Patient expressed understanding and was in agreement with this plan. She also understands that She can call clinic at any time with any questions, concerns, or complaints.    No matching staging information was found for the patient.  Lauren Gleason, MD   03/14/2015 8:22 AM

## 2015-07-14 DIAGNOSIS — M25551 Pain in right hip: Secondary | ICD-10-CM | POA: Diagnosis not present

## 2015-07-15 ENCOUNTER — Other Ambulatory Visit: Payer: Self-pay | Admitting: Internal Medicine

## 2015-07-15 DIAGNOSIS — Z1231 Encounter for screening mammogram for malignant neoplasm of breast: Secondary | ICD-10-CM

## 2015-09-09 ENCOUNTER — Encounter: Payer: Self-pay | Admitting: *Deleted

## 2015-09-09 ENCOUNTER — Inpatient Hospital Stay: Payer: Medicare Other | Attending: Oncology

## 2015-09-09 ENCOUNTER — Inpatient Hospital Stay (HOSPITAL_BASED_OUTPATIENT_CLINIC_OR_DEPARTMENT_OTHER): Payer: Medicare Other | Admitting: Oncology

## 2015-09-09 VITALS — BP 129/82 | HR 68 | Temp 97.1°F | Resp 18 | Wt 133.2 lb

## 2015-09-09 DIAGNOSIS — Z9221 Personal history of antineoplastic chemotherapy: Secondary | ICD-10-CM | POA: Insufficient documentation

## 2015-09-09 DIAGNOSIS — C342 Malignant neoplasm of middle lobe, bronchus or lung: Secondary | ICD-10-CM | POA: Diagnosis present

## 2015-09-09 DIAGNOSIS — Z923 Personal history of irradiation: Secondary | ICD-10-CM | POA: Insufficient documentation

## 2015-09-09 DIAGNOSIS — C3490 Malignant neoplasm of unspecified part of unspecified bronchus or lung: Secondary | ICD-10-CM

## 2015-09-09 DIAGNOSIS — K219 Gastro-esophageal reflux disease without esophagitis: Secondary | ICD-10-CM | POA: Diagnosis not present

## 2015-09-09 DIAGNOSIS — J45909 Unspecified asthma, uncomplicated: Secondary | ICD-10-CM | POA: Insufficient documentation

## 2015-09-09 DIAGNOSIS — Z79899 Other long term (current) drug therapy: Secondary | ICD-10-CM | POA: Insufficient documentation

## 2015-09-09 DIAGNOSIS — I341 Nonrheumatic mitral (valve) prolapse: Secondary | ICD-10-CM | POA: Insufficient documentation

## 2015-09-09 LAB — COMPREHENSIVE METABOLIC PANEL
ALBUMIN: 3.9 g/dL (ref 3.5–5.0)
ALK PHOS: 69 U/L (ref 38–126)
ALT: 28 U/L (ref 14–54)
AST: 33 U/L (ref 15–41)
Anion gap: 7 (ref 5–15)
BILIRUBIN TOTAL: 0.5 mg/dL (ref 0.3–1.2)
BUN: 21 mg/dL — AB (ref 6–20)
CALCIUM: 8.6 mg/dL — AB (ref 8.9–10.3)
CO2: 25 mmol/L (ref 22–32)
CREATININE: 0.82 mg/dL (ref 0.44–1.00)
Chloride: 102 mmol/L (ref 101–111)
GFR calc Af Amer: >60 mL/min (ref >60)
GFR calc non Af Amer: >60 mL/min (ref >60)
GLUCOSE: 93 mg/dL (ref 65–99)
Potassium: 3.7 mmol/L (ref 3.5–5.1)
Sodium: 134 mmol/L — ABNORMAL LOW (ref 135–145)
TOTAL PROTEIN: 7.3 g/dL (ref 6.5–8.1)

## 2015-09-09 LAB — CBC WITH DIFFERENTIAL/PLATELET
BASOS PCT: 1 %
Basophils Absolute: 0 10*3/uL (ref 0–0.1)
Eosinophils Absolute: 0.1 10*3/uL (ref 0–0.7)
Eosinophils Relative: 2 %
HEMATOCRIT: 39.5 % (ref 35.0–47.0)
HEMOGLOBIN: 13.5 g/dL (ref 12.0–16.0)
LYMPHS PCT: 23 %
Lymphs Abs: 1.3 10*3/uL (ref 1.0–3.6)
MCH: 31.1 pg (ref 26.0–34.0)
MCHC: 34 g/dL (ref 32.0–36.0)
MCV: 91.4 fL (ref 80.0–100.0)
Monocytes Absolute: 0.4 10*3/uL (ref 0.2–0.9)
Monocytes Relative: 7 %
NEUTROS ABS: 3.8 10*3/uL (ref 1.4–6.5)
NEUTROS PCT: 67 %
Platelets: 239 10*3/uL (ref 150–440)
RBC: 4.33 MIL/uL (ref 3.80–5.20)
RDW: 13.8 % (ref 11.5–14.5)
WBC: 5.6 10*3/uL (ref 3.6–11.0)

## 2015-09-09 NOTE — Progress Notes (Signed)
Lauren Page presents to [facility] for 6 month follow up visit. She reports she is doing well with exception of still having a split fingernail - left index finger - since coming off study drug in 04/2014. She also reports her hair is thinning, but she is not sure whether this is due to aging or from the study drug she took. She denies any other problems with exception of a NP cough and some hoarseness. Dr. Oliva Bustard in to examine her and states he will order a CT scan and patient to return to clinic in 6 months. She is to be scheduled to see Dr. Rogue Bussing at that time. [name], BSN, MHA, OCN 09/09/2015 3:30 PM

## 2015-09-09 NOTE — Progress Notes (Signed)
Patient states she has started taking Keratin for her hair.

## 2015-09-12 ENCOUNTER — Encounter: Payer: Self-pay | Admitting: Oncology

## 2015-09-12 NOTE — Progress Notes (Signed)
[place] @ [facility] Telephone:(336) [phone]  Fax:(336) [place]: [birth year]  MR#: [id number]  JYN#:[id number]  Patient Care Team: [name], MD as PCP - General (Internal Medicine)  CHIEF COMPLAINT:  Chief Complaint  Patient presents with  . Lung Cancer   Chief Complaint/Diagnosis:   1..A right middle lobe adenocarcinoma.  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  2.EGFR mutation present 3.started on adjuvant chemotherapy with cis-platinum and Alimta .  may of 2015 4.Patient has finished 4 cycles of chemotherapy on January 23, 2014 Now patient would be randomized   to  our [facility] protocol for Tarceva vs. placebo because of EGFR mutation 3.patient desires to get off protocol because of progressive side effect even after reducing dose.  (May 13, 2014) 5.Patient is starting  maintenance investigationally approach with Tarceva vs. placebo September of 2015. 6.Patient came off protocol therapy because of significant GI side effect May 13, 2014     INTERVAL HISTORY: 65 year old lady came today further follow-up regarding carcinoma of lung.  Patient had a PET scan for restaging workup.  No chills.  No fever.  No nausea.  No cough.  No chest pain.  Patient does not smoke.  He has not lost any weight. Patient  is here for ongoing evaluation and treatment consideration Chills and fever.  Appetite has been stable.  No nausea.  No vomiting.  No diarrhea. REVIEW OF SYSTEMS:   GENERAL:  Feels good.  Active.  No fevers, sweats or weight loss. PERFORMANCE STATUS (ECOG): 0 HEENT:  No visual changes, runny nose, sore throat, mouth sores or tenderness. Lungs: No shortness of breath or cough.  No hemoptysis. Cardiac:  No chest pain, palpitations, orthopnea, or PND. GI:  No nausea, vomiting, diarrhea, constipation, melena or hematochezia. GU:  No urgency, frequency, dysuria, or  hematuria. Musculoskeletal:  No back pain.  No joint pain.  No muscle tenderness. Extremities:  No pain or swelling. Skin:  No rashes or skin changes. Neuro:  No headache, numbness or weakness, balance or coordination issues. Endocrine:  No diabetes, thyroid issues, hot flashes or night sweats. Psych:  No mood changes, depression or anxiety. Pain:  No focal pain. Review of systems:  All other systems reviewed and found to be negative. As per HPI. Otherwise, a complete review of systems is negatve.  PAST MEDICAL HISTORY: Past Medical History  Diagnosis Date  . Mitral valve prolapse   . Acid reflux   . Asthma   . Cancer ([place]) 2015    lung cancer - chemo    PAST SURGICAL HISTORY: Past Surgical History  Procedure Laterality Date  . Colonoscopy  2008     Dr. Donnella Sham  . Upper gi endoscopy  2008  . Knee surgery  2007  . Breast biopsy Left 1994 (-), 2004 (-)    FAMILY HISTORY Family History  Problem Relation Age of Onset  . Breast cancer Maternal Aunt   . Breast cancer Paternal Aunt   . Breast cancer Maternal Grandmother   . Breast cancer Maternal Aunt   . Breast cancer Paternal Aunt     ADVANCED DIRECTIVES:  Patient does not have any living will or healthcare power of attorney.  Information was given .  Available resources had been discussed.  We will follow-up on subsequent appointments regarding this issue HEALTH MAINTENANCE: Social History  Substance Use Topics  . Smoking status:  Never Smoker   . Smokeless tobacco: None  . Alcohol Use: Yes      Allergies  Allergen Reactions  . Sulfa Antibiotics Nausea And Vomiting  . Penicillins Swelling and Rash    Current Outpatient Prescriptions  Medication Sig Dispense Refill  . albuterol (PROAIR HFA) 108 (90 BASE) MCG/ACT inhaler INHALE 2 PUFFS AS DIRECTED AS NEEDED    . Biotin 800 MCG TABS Take by mouth.    . estrogens, conjugated, (PREMARIN) 0.625 MG tablet Take by mouth.    . fexofenadine (ALLEGRA) 180 MG tablet Take  by mouth.    . fluticasone (FLONASE) 50 MCG/ACT nasal spray USE 2 SPRAYS IN EACH NOSTRIL AT BEDTIME    . Fluticasone-Salmeterol (ADVAIR DISKUS) 250-50 MCG/DOSE AEPB INHALE ONE PUFF INTO THE LUNGS 2 TIMES DAILY.    Marland Kitchen lansoprazole (PREVACID) 30 MG capsule Take 30 mg by mouth daily.    Marland Kitchen estradiol (ESTRACE) 0.5 MG tablet Take by mouth.     No current facility-administered medications for this visit.    OBJECTIVE:  Filed Vitals:   09/09/15 1446  BP: 129/82  Pulse: 68  Temp: 97.1 F (36.2 C)  Resp: 18     Body mass index is 23.21 kg/(m^2).    ECOG FS:0 - Asymptomatic  PHYSICAL EXAM: GENERAL:  Well developed, well nourished, sitting comfortably in the exam room in no acute distress. MENTAL STATUS:  Alert and oriented to person, place and time. . ENT:  Oropharynx clear without lesion.  Tongue normal. Mucous membranes moist.  RESPIRATORY:  Clear to auscultation without rales, wheezes or rhonchi. CARDIOVASCULAR:  Regular rate and rhythm without murmur, rub or gallop.  ABDOMEN:  Soft, non-tender, with active bowel sounds, and no hepatosplenomegaly.  No masses. BACK:  No CVA tenderness.  No tenderness on percussion of the back or rib cage. SKIN:  No rashes, ulcers or lesions. EXTREMITIES: No edema, no skin discoloration or tenderness.  No palpable cords. LYMPH NODES: No palpable cervical, supraclavicular, axillary or inguinal adenopathy  NEUROLOGICAL: Unremarkable. PSYCH:  Appropriate.   LAB RESULTS:  CBC Latest Ref Rng 09/09/2015 03/12/2015  WBC 3.6 - 11.0 K/uL 5.6 4.2  Hemoglobin 12.0 - 16.0 g/dL 13.5 13.4  Hematocrit 35.0 - 47.0 % 39.5 38.9  Platelets 150 - 440 K/uL 239 271    Appointment on 09/09/2015  Component Date Value Ref Range Status  . WBC 09/09/2015 5.6  3.6 - 11.0 K/uL Final  . RBC 09/09/2015 4.33  3.80 - 5.20 MIL/uL Final  . Hemoglobin 09/09/2015 13.5  12.0 - 16.0 g/dL Final  . HCT 09/09/2015 39.5  35.0 - 47.0 % Final  . MCV 09/09/2015 91.4  80.0 - 100.0 fL Final    . MCH 09/09/2015 31.1  26.0 - 34.0 pg Final  . MCHC 09/09/2015 34.0  32.0 - 36.0 g/dL Final  . RDW 09/09/2015 13.8  11.5 - 14.5 % Final  . Platelets 09/09/2015 239  150 - 440 K/uL Final  . Neutrophils Relative % 09/09/2015 67   Final  . Neutro Abs 09/09/2015 3.8  1.4 - 6.5 K/uL Final  . Lymphocytes Relative 09/09/2015 23   Final  . Lymphs Abs 09/09/2015 1.3  1.0 - 3.6 K/uL Final  . Monocytes Relative 09/09/2015 7   Final  . Monocytes Absolute 09/09/2015 0.4  0.2 - 0.9 K/uL Final  . Eosinophils Relative 09/09/2015 2   Final  . Eosinophils Absolute 09/09/2015 0.1  0 - 0.7 K/uL Final  . Basophils Relative 09/09/2015 1   Final  .  Basophils Absolute 09/09/2015 0.0  0 - 0.1 K/uL Final  . Sodium 09/09/2015 134* 135 - 145 mmol/L Final  . Potassium 09/09/2015 3.7  3.5 - 5.1 mmol/L Final  . Chloride 09/09/2015 102  101 - 111 mmol/L Final  . CO2 09/09/2015 25  22 - 32 mmol/L Final  . Glucose, Bld 09/09/2015 93  65 - 99 mg/dL Final  . BUN 09/09/2015 21* 6 - 20 mg/dL Final  . Creatinine, Ser 09/09/2015 0.82  0.44 - 1.00 mg/dL Final  . Calcium 09/09/2015 8.6* 8.9 - 10.3 mg/dL Final  . Total Protein 09/09/2015 7.3  6.5 - 8.1 g/dL Final  . Albumin 09/09/2015 3.9  3.5 - 5.0 g/dL Final  . AST 09/09/2015 33  15 - 41 U/L Final  . ALT 09/09/2015 28  14 - 54 U/L Final  . Alkaline Phosphatase 09/09/2015 69  38 - 126 U/L Final  . Total Bilirubin 09/09/2015 0.5  0.3 - 1.2 mg/dL Final  . GFR calc non Af Amer 09/09/2015 >60  >60 mL/min Final  . GFR calc Af Amer 09/09/2015 >60  >60 mL/min Final   Comment: (NOTE) The eGFR has been calculated using the CKD EPI equation. This calculation has not been validated in all clinical situations. eGFR's persistently <60 mL/min signify possible Chronic Kidney Disease.   . Anion gap 09/09/2015 7  5 - 15 Final       STUDIES: No results found.  ASSESSMENT: Carcinoma  of lung, adenocarcinoma EGFR mutated status post resection and adjuvant chemotherapy followed by  Tarceva under the clinical trial  MEDICAL DECISION MAKING:  On clinical ground there is no evidence of recurrent disease Assessment we will get a repeat CT scan of chest on 23rd March S per required by protocol which will be reviewed This has been ordered. Situation has been discussed with the research nurses regarding next appointment which will be in 6 months Patient has been informed because of my planned retirement patient may be seen by my associate as a follow-up. Research nurses will arrange for appropriate follow-up in 6 months. NCCN guidelines was also discussed regarding follow-up for lung cancer Surveillance  chest CT scan with and without contrast every 6-12 months postoperatively for 2 years. In low dose noncontrast enhanced chest CT scan is recommended annually thereafter.  Patient treated with radiation chemotherapy who has residual abnormality may require more frequent PET scan and CT scan.  MRI of brain is not recommended without symptoms PET scan may be helpful for assessing CT scan that appeared to show malignant neoplasm but may be radiation fibrosis or atelectasis.  Patient expressed understanding and was in agreement with this plan. She also understands that She can call clinic at any time with any questions, concerns, or complaints.    No matching staging information was found for the patient.  Forest Gleason, MD   09/12/2015 9:25 AM

## 2015-09-15 ENCOUNTER — Ambulatory Visit
Admission: RE | Admit: 2015-09-15 | Discharge: 2015-09-15 | Disposition: A | Discharge status: Home / Self Care | Payer: Medicare Other | Source: Ambulatory Visit | Attending: Internal Medicine | Admitting: Internal Medicine

## 2015-09-15 ENCOUNTER — Other Ambulatory Visit: Payer: Self-pay | Admitting: Internal Medicine

## 2015-09-15 DIAGNOSIS — Z1231 Encounter for screening mammogram for malignant neoplasm of breast: Secondary | ICD-10-CM

## 2015-09-24 ENCOUNTER — Ambulatory Visit
Admission: RE | Admit: 2015-09-24 | Discharge: 2015-09-24 | Disposition: A | Discharge status: Home / Self Care | Payer: Medicare Other | Source: Ambulatory Visit | Attending: Oncology | Admitting: Oncology

## 2015-09-24 DIAGNOSIS — K769 Liver disease, unspecified: Secondary | ICD-10-CM | POA: Diagnosis not present

## 2015-09-24 DIAGNOSIS — I77810 Thoracic aortic ectasia: Secondary | ICD-10-CM | POA: Diagnosis not present

## 2015-09-24 DIAGNOSIS — C3490 Malignant neoplasm of unspecified part of unspecified bronchus or lung: Secondary | ICD-10-CM | POA: Diagnosis present

## 2015-09-24 MED ORDER — IOPAMIDOL (ISOVUE-300) INJECTION 61%
75.0000 mL | Freq: Once | INTRAVENOUS | Status: AC | PRN
  Administered 2015-09-24: 75 mL via INTRAVENOUS

## 2015-10-07 ENCOUNTER — Telehealth: Payer: Self-pay | Admitting: *Deleted

## 2015-10-07 NOTE — Telephone Encounter (Signed)
T/C made to Lauren Page Lauren Page regarding [facility] audit findings of a pill count discrepency related to the study drug Erlotinib/placebo. Questioned Lauren Page regarding whether she had inadvertently missed returning all of her study drug by possibly leaving it in her pillbox. Lauren Page states that is possible because she did use a pillbox to help her manage drug administration. States she does not remember removing the drug from her pillbox or destroying it. She also states it is possible that she may still have it in her medicine cabinet. Lauren Page agrees to look in her medicine cabinet for a bottle of study drug and call me back to let me know what she finds. [name], BSN, MHA, OCN 10/07/2015 9:45 AM   T/C back to Lauren Page to determine whether she was able to find any of the study drug in her medicine cabinet. Lauren Page states she looked, but did not find any. States she does not know what could have happened to it unless she threw it away when she took it out of her pillbox. Thanked Lauren Page for looking for the drug in her medicine cabinet and trying to recall what may have happened to it. She questions what will happen now, and informed her that we just wanted to try to account for all of the drug and document it since it was likely chemotherapy. [name], BSN, MHA, OCN 10/07/2015 3:44 PM

## 2015-12-24 ENCOUNTER — Telehealth: Payer: Self-pay | Admitting: *Deleted

## 2015-12-24 NOTE — Telephone Encounter (Signed)
T/C made to Tiwana Chavis this morning to inform her that the [employer] [id number] has informed us that all of the study participants are to be unblinded at this time and we are notifying them of the study arm they were on. Confirmed with Mirabel that she was on the actual study drug - which she states she knew all along due to the number of IP related side effects she experienced. She asked what will happen now, and was informed that I am sending her an official letter explaining what the study is changing. I also told her that the people who are actively taking study drug will now know what they are receiving, and those receiving placebo will return their drug and no longer be required to take the placebo - but will remain on study. Informed Kamiryn that she can call me when she receives the letter if she has any questions. [name], BSN, MHA, OCN 12/24/2015 9:39 AM

## 2016-03-10 ENCOUNTER — Other Ambulatory Visit: Payer: Self-pay

## 2016-03-10 DIAGNOSIS — C3490 Malignant neoplasm of unspecified part of unspecified bronchus or lung: Secondary | ICD-10-CM

## 2016-03-11 ENCOUNTER — Encounter: Payer: Self-pay | Admitting: Internal Medicine

## 2016-03-11 ENCOUNTER — Inpatient Hospital Stay: Payer: Medicare Other | Attending: Internal Medicine | Admitting: Internal Medicine

## 2016-03-11 ENCOUNTER — Encounter: Payer: Self-pay | Admitting: *Deleted

## 2016-03-11 ENCOUNTER — Inpatient Hospital Stay: Payer: Medicare Other

## 2016-03-11 DIAGNOSIS — I341 Nonrheumatic mitral (valve) prolapse: Secondary | ICD-10-CM | POA: Insufficient documentation

## 2016-03-11 DIAGNOSIS — C342 Malignant neoplasm of middle lobe, bronchus or lung: Secondary | ICD-10-CM | POA: Diagnosis not present

## 2016-03-11 DIAGNOSIS — Z9221 Personal history of antineoplastic chemotherapy: Secondary | ICD-10-CM | POA: Insufficient documentation

## 2016-03-11 DIAGNOSIS — J45909 Unspecified asthma, uncomplicated: Secondary | ICD-10-CM | POA: Diagnosis not present

## 2016-03-11 DIAGNOSIS — C3490 Malignant neoplasm of unspecified part of unspecified bronchus or lung: Secondary | ICD-10-CM

## 2016-03-11 DIAGNOSIS — Z79899 Other long term (current) drug therapy: Secondary | ICD-10-CM | POA: Diagnosis not present

## 2016-03-11 DIAGNOSIS — K219 Gastro-esophageal reflux disease without esophagitis: Secondary | ICD-10-CM | POA: Insufficient documentation

## 2016-03-11 HISTORY — DX: Malignant neoplasm of middle lobe, bronchus or lung: C34.2

## 2016-03-11 LAB — COMPREHENSIVE METABOLIC PANEL
ALT: 23 U/L (ref 14–54)
ANION GAP: 6 (ref 5–15)
AST: 29 U/L (ref 15–41)
Albumin: 4 g/dL (ref 3.5–5.0)
Alkaline Phosphatase: 52 U/L (ref 38–126)
BUN: 17 mg/dL (ref 6–20)
CALCIUM: 8.9 mg/dL (ref 8.9–10.3)
CHLORIDE: 103 mmol/L (ref 101–111)
CO2: 28 mmol/L (ref 22–32)
Creatinine, Ser: 0.69 mg/dL (ref 0.44–1.00)
Glucose, Bld: 88 mg/dL (ref 65–99)
Potassium: 4 mmol/L (ref 3.5–5.1)
SODIUM: 137 mmol/L (ref 135–145)
Total Bilirubin: 0.4 mg/dL (ref 0.3–1.2)
Total Protein: 7 g/dL (ref 6.5–8.1)

## 2016-03-11 LAB — CBC WITH DIFFERENTIAL/PLATELET
Basophils Absolute: 0 10*3/uL (ref 0–0.1)
Basophils Relative: 1 %
EOS ABS: 0.1 10*3/uL (ref 0–0.7)
EOS PCT: 3 %
HCT: 39 % (ref 35.0–47.0)
Hemoglobin: 13.5 g/dL (ref 12.0–16.0)
LYMPHS ABS: 1 10*3/uL (ref 1.0–3.6)
LYMPHS PCT: 27 %
MCH: 31.4 pg (ref 26.0–34.0)
MCHC: 34.5 g/dL (ref 32.0–36.0)
MCV: 91.1 fL (ref 80.0–100.0)
MONO ABS: 0.3 10*3/uL (ref 0.2–0.9)
MONOS PCT: 8 %
Neutro Abs: 2.3 10*3/uL (ref 1.4–6.5)
Neutrophils Relative %: 61 %
PLATELETS: 242 10*3/uL (ref 150–440)
RBC: 4.28 MIL/uL (ref 3.80–5.20)
RDW: 13.7 % (ref 11.5–14.5)
WBC: 3.7 10*3/uL (ref 3.6–11.0)

## 2016-03-11 NOTE — Progress Notes (Signed)
Pt reports on exertion and an increased cough since visit .

## 2016-03-11 NOTE — Progress Notes (Unsigned)
Lauren [place] returns to clinic this morning for follow up visit. This is her first visit with Dr. Rogue Bussing and he has questioned when her next CT scan is due. Informed that she is to have a CT of her chest and adrenals every 6 months along with a follow up clinic visit. Dr. Rogue Bussing is to put in order for this. [name] reports she is doing well and her only complaint is some mild shortness of breath on exertion. Discussed with Dr. B that the patient was recently unblinded and did actually receive Erlotinib, which we thought was the case due to all of the side effects she experienced while taking the drug. She has been scheduled for a CT scan on 03/16/16, and will return to see Dr. Jacinto Reap on 09/08/16 unless she requires an earlier appointment. [name], BSN, MHA, OCN 03/11/2016 12:22 PM

## 2016-03-11 NOTE — Progress Notes (Signed)
[place] OFFICE PROGRESS NOTE  Patient Care Team: [name], MD as PCP - General (Internal Medicine)  No matching staging information was found for the patient.   Oncology History   1..A right middle lobe adenocarcinoma.  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  2.EGFR mutation present 3.started on adjuvant chemotherapy with cis-platinum and Alimta .  may of 2015 4.Patient has finished 4 cycles of chemotherapy on January 23, 2014 Now patient would be randomized   to  our [facility] protocol for Tarceva vs. placebo because of EGFR mutation 3.patient desires to get off protocol because of progressive side effect even after reducing dose.  (May 13, 2014) 5.Patient is starting  maintenance investigationally approach with Tarceva vs. placebo September of 2015. 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 I reviewed the patient's prior charts/pertinent labs/imaging in detail; findings are summarized above.   INTERVAL HISTORY:  Lauren [place] 65 y.o.  female pleasant patient above history of Stage III lung cancer status post chemotherapy; EGFR mutated on adjuvant Tarceva clinical trial. Patient come off Tarceva because of significant side effects.  Patient currently is doing well. She is exercising. She has mild shortness of breath with exertion. Otherwise no significant cough. Denies any weight loss. Denies any headaches. No chest pain.   REVIEW OF SYSTEMS:  A complete 10 point review of system is done which is negative  except mentioned above/history of present illness.   PAST MEDICAL HISTORY :  Past Medical History:  Diagnosis Date  . Acid reflux   . Asthma   . Cancer ([place]) 2015   lung cancer - chemo  . Mitral valve prolapse     PAST SURGICAL HISTORY :   Past Surgical History:  Procedure Laterality Date  . ABDOMINAL HYSTERECTOMY    . BREAST BIOPSY Left 1994 (-), 2004 (-)  . COLONOSCOPY  2008    Dr. Donnella Sham  . KNEE SURGERY  2007  . UPPER GI ENDOSCOPY  2008    FAMILY HISTORY :   Family History  Problem Relation Age of Onset  . Breast cancer Maternal Aunt   . Breast cancer Paternal Aunt   . Breast cancer Maternal Grandmother   . Breast cancer Maternal Aunt   . Breast cancer Paternal Aunt     SOCIAL HISTORY:   Social History  Substance Use Topics  . Smoking status: Never Smoker  . Smokeless tobacco: Not on file  . Alcohol use Yes    ALLERGIES:  is allergic to sulfa antibiotics and penicillins.  MEDICATIONS:  Current Outpatient Prescriptions  Medication Sig Dispense Refill  . albuterol (PROAIR HFA) 108 (90 BASE) MCG/ACT inhaler INHALE 2 PUFFS AS DIRECTED AS NEEDED    . Biotin 800 MCG TABS Take by mouth.    . estrogens, conjugated, (PREMARIN) 0.625 MG tablet Take by mouth.    . fexofenadine (ALLEGRA) 180 MG tablet Take by mouth.    . fluticasone (FLONASE) 50 MCG/ACT nasal spray  USE 2 SPRAYS IN EACH NOSTRIL AT BEDTIME    . Fluticasone-Salmeterol (ADVAIR DISKUS) 250-50 MCG/DOSE AEPB INHALE ONE PUFF INTO THE LUNGS 2 TIMES DAILY.    Marland Kitchen lansoprazole (PREVACID) 30 MG capsule Take 30 mg by mouth daily.    . B Complex Vitamins (VITAMIN B COMPLEX PO) Take by mouth.    . estradiol (ESTRACE) 0.5 MG tablet Take by mouth.     No current facility-administered medications for this visit.     PHYSICAL EXAMINATION: ECOG PERFORMANCE STATUS: 0 - Asymptomatic  BP (!) 153/86   Pulse (!) 52   Temp 97.1 F (36.2 C) (Tympanic)   Resp 16   Ht 5' 3.5" (1.613 m)   Wt 133 lb 12.8 oz (60.7 kg)    SpO2 97%   BMI 23.33 kg/m   Filed Weights   03/11/16 1155  Weight: 133 lb 12.8 oz (60.7 kg)    GENERAL: Well-nourished well-developed; Alert, no distress and comfortable.  Alone.  EYES: no pallor or icterus OROPHARYNX: no thrush or ulceration; good dentition  NECK: supple, no masses felt LYMPH:  no palpable lymphadenopathy in the cervical, axillary or inguinal regions LUNGS: clear to auscultation and  No wheeze or crackles HEART/CVS: regular rate & rhythm and no murmurs; No lower extremity edema ABDOMEN:abdomen soft, non-tender and normal bowel sounds Musculoskeletal:no cyanosis of digits and no clubbing  PSYCH: alert & oriented x 3 with fluent speech NEURO: no focal motor/sensory deficits SKIN:  no rashes or significant lesions  LABORATORY DATA:  I have reviewed the data as listed    Component Value Date/Time   NA 137 03/11/2016 1138   NA 141 06/03/2014 1039   K 4.0 03/11/2016 1138   K 4.1 06/03/2014 1039   CL 103 03/11/2016 1138   CL 105 06/03/2014 1039   CO2 28 03/11/2016 1138   CO2 28 06/03/2014 1039   GLUCOSE 88 03/11/2016 1138   GLUCOSE 88 06/03/2014 1039   BUN 17 03/11/2016 1138   BUN 14 06/03/2014 1039   CREATININE 0.69 03/11/2016 1138   CREATININE 0.68 06/03/2014 1039   CALCIUM 8.9 03/11/2016 1138   CALCIUM 8.7 06/03/2014 1039   PROT 7.0 03/11/2016 1138   PROT 7.6 06/03/2014 1039   ALBUMIN 4.0 03/11/2016 1138   ALBUMIN 3.7 06/03/2014 1039   AST 29 03/11/2016 1138   AST 36 06/03/2014 1039   ALT 23 03/11/2016 1138   ALT 34 06/03/2014 1039   ALKPHOS 52 03/11/2016 1138   ALKPHOS 86 06/03/2014 1039   BILITOT 0.4 03/11/2016 1138   BILITOT 0.4 06/03/2014 1039   GFRNONAA >60 03/11/2016 1138   GFRNONAA >60 06/03/2014 1039   GFRNONAA >60 03/20/2014 1109   GFRAA >60 03/11/2016 1138   GFRAA >60 06/03/2014 1039   GFRAA >60 03/20/2014 1109    No results found for: SPEP, UPEP  Lab Results  Component Value Date   WBC 3.7 03/11/2016   NEUTROABS 2.3  03/11/2016   HGB 13.5 03/11/2016   HCT 39.0 03/11/2016   MCV 91.1 03/11/2016   PLT 242 03/11/2016      Chemistry      Component Value Date/Time   NA 137 03/11/2016 1138   NA 141 06/03/2014 1039   K 4.0 03/11/2016 1138   K 4.1 06/03/2014 1039   CL 103 03/11/2016 1138   CL 105 06/03/2014 1039   CO2 28 03/11/2016 1138   CO2 28 06/03/2014 1039   BUN 17 03/11/2016 1138   BUN 14 06/03/2014 1039  CREATININE 0.69 03/11/2016 1138   CREATININE 0.68 06/03/2014 1039      Component Value Date/Time   CALCIUM 8.9 03/11/2016 1138   CALCIUM 8.7 06/03/2014 1039   ALKPHOS 52 03/11/2016 1138   ALKPHOS 86 06/03/2014 1039   AST 29 03/11/2016 1138   AST 36 06/03/2014 1039   ALT 23 03/11/2016 1138   ALT 34 06/03/2014 1039   BILITOT 0.4 03/11/2016 1138   BILITOT 0.4 06/03/2014 1039    1. As before there is faint nodularity along the major fissure compatible with small subpleural lymph nodes, not appreciably changed. Appearance favors benign etiology. No recurrence identified. 2. 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 3. Mild left eccentric central narrowing of the thecal sac at T6-7 due to intervertebral spurring. 4. 5 mm hypodense right hepatic lobe lesion, no change over 2 years, considered benign. Likely a cyst.   Electronically Signed   By: Van Clines M.D.   On: 09/24/2015 14:17   RADIOGRAPHIC STUDIES: I have personally reviewed the radiological images as listed and agreed with the findings in the report. No results found.   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 Plan CT chest scan now. Labs within normal limits.  # plan follow up in 6 months or sooner based the results of the CT scan. Patient will need CT chest every 6 months as part of clinical trial.  Discussed clinical trials RN.    Orders Placed This  Encounter  Procedures  . CT CHEST W CONTRAST    Standing Status:   Future    Standing Expiration Date:   05/11/2017    Order Specific Question:   Reason for Exam (SYMPTOM  OR DIAGNOSIS REQUIRED)    Answer:   lung cancer    Order Specific Question:   Preferred imaging location?    Answer:   [facility]  . Comprehensive metabolic panel    Standing Status:   Future    Standing Expiration Date:   03/11/2017  . CBC with Differential    Standing Status:   Future    Standing Expiration Date:   03/11/2017   All questions were answered. The patient knows to call the clinic with any problems, questions or concerns.      Cammie Sickle, MD 03/11/2016 12:46 PM

## 2016-03-11 NOTE — Assessment & Plan Note (Addendum)
#  STAGE III lung ca [EGFR mutated on adjuvant erlotinib trial- off sec to SEs]; last CT march 2017- clinically NED. Plan CT chest scan now. Labs within normal limits.  # plan follow up in 6 months or sooner based the results of the CT scan. Patient will need CT chest every 6 months as part of clinical trial.  Discussed clinical trials RN.

## 2016-03-16 ENCOUNTER — Ambulatory Visit: Payer: No Typology Code available for payment source

## 2016-03-16 ENCOUNTER — Ambulatory Visit
Admission: RE | Admit: 2016-03-16 | Discharge: 2016-03-16 | Disposition: A | Discharge status: Home / Self Care | Payer: Medicare Other | Source: Ambulatory Visit | Attending: Internal Medicine | Admitting: Internal Medicine

## 2016-03-16 DIAGNOSIS — Z902 Acquired absence of lung [part of]: Secondary | ICD-10-CM | POA: Insufficient documentation

## 2016-03-16 DIAGNOSIS — I77819 Aortic ectasia, unspecified site: Secondary | ICD-10-CM | POA: Insufficient documentation

## 2016-03-16 DIAGNOSIS — C342 Malignant neoplasm of middle lobe, bronchus or lung: Secondary | ICD-10-CM | POA: Diagnosis not present

## 2016-03-16 DIAGNOSIS — I700 Atherosclerosis of aorta: Secondary | ICD-10-CM | POA: Diagnosis not present

## 2016-03-16 HISTORY — DX: Malignant neoplasm of unspecified part of right bronchus or lung: C34.91

## 2016-03-16 MED ORDER — IOPAMIDOL (ISOVUE-300) INJECTION 61%
75.0000 mL | Freq: Once | INTRAVENOUS | Status: AC | PRN
  Administered 2016-03-16: 75 mL via INTRAVENOUS

## 2016-03-21 ENCOUNTER — Other Ambulatory Visit: Payer: Self-pay | Admitting: Internal Medicine

## 2016-03-21 ENCOUNTER — Telehealth: Payer: Self-pay | Admitting: Internal Medicine

## 2016-03-21 DIAGNOSIS — C342 Malignant neoplasm of middle lobe, bronchus or lung: Secondary | ICD-10-CM

## 2016-03-21 NOTE — Telephone Encounter (Signed)
Heather please inform patient that her CT scan shows mild scarring at the site of surgery; reviewed with Dr. Genevive Bi. Recommend CT scan in 6 months.  #  I have ordered CT scan prior to next visit in 6 months

## 2016-03-22 ENCOUNTER — Other Ambulatory Visit: Payer: Self-pay | Admitting: Internal Medicine

## 2016-03-22 DIAGNOSIS — C342 Malignant neoplasm of middle lobe, bronchus or lung: Secondary | ICD-10-CM

## 2016-03-22 NOTE — Telephone Encounter (Signed)
Contacted patient with md recommendations. Pt states that her ct report reading is still concerning her. The "report in my chart does not specifically say scar tissue. I would feel better if Dr. Rogue Bussing would order a pet scan as soon as possible. This is what I wanted to start with."  I explained that I would bring her request for additional imaging to Dr. Aletha Halim attn.

## 2016-03-23 NOTE — Progress Notes (Signed)
msg sent to cancer center sch. Team to arrange pet scan

## 2016-03-28 ENCOUNTER — Encounter
Admission: RE | Admit: 2016-03-28 | Discharge: 2016-03-28 | Disposition: A | Discharge status: Home / Self Care | Payer: Medicare Other | Source: Ambulatory Visit | Attending: Internal Medicine | Admitting: Internal Medicine

## 2016-03-28 DIAGNOSIS — C342 Malignant neoplasm of middle lobe, bronchus or lung: Secondary | ICD-10-CM | POA: Diagnosis present

## 2016-03-28 LAB — GLUCOSE, CAPILLARY: GLUCOSE-CAPILLARY: 80 mg/dL (ref 65–99)

## 2016-03-28 MED ORDER — FLUDEOXYGLUCOSE F - 18 (FDG) INJECTION
12.0000 | Freq: Once | INTRAVENOUS | Status: AC | PRN
  Administered 2016-03-28: 12.56 via INTRAVENOUS

## 2016-03-29 ENCOUNTER — Other Ambulatory Visit: Payer: Self-pay | Admitting: Internal Medicine

## 2016-03-29 ENCOUNTER — Telehealth: Payer: Self-pay

## 2016-03-29 DIAGNOSIS — C342 Malignant neoplasm of middle lobe, bronchus or lung: Secondary | ICD-10-CM

## 2016-03-29 NOTE — Telephone Encounter (Signed)
Called and left vm for pt to call me back at cancer center in [place] tomorrow r/t PET scan results

## 2016-08-03 DIAGNOSIS — E559 Vitamin D deficiency, unspecified: Secondary | ICD-10-CM | POA: Insufficient documentation

## 2016-08-04 ENCOUNTER — Other Ambulatory Visit: Payer: Self-pay | Admitting: Internal Medicine

## 2016-08-04 DIAGNOSIS — Z1231 Encounter for screening mammogram for malignant neoplasm of breast: Secondary | ICD-10-CM

## 2016-09-06 ENCOUNTER — Ambulatory Visit
Admission: RE | Admit: 2016-09-06 | Discharge: 2016-09-06 | Disposition: A | Discharge status: Home / Self Care | Payer: Medicare Other | Source: Ambulatory Visit | Attending: Internal Medicine | Admitting: Internal Medicine

## 2016-09-06 ENCOUNTER — Ambulatory Visit: Admission: RE | Admit: 2016-09-06 | Payer: Medicare Other | Source: Ambulatory Visit

## 2016-09-06 DIAGNOSIS — R918 Other nonspecific abnormal finding of lung field: Secondary | ICD-10-CM | POA: Diagnosis not present

## 2016-09-06 DIAGNOSIS — Z902 Acquired absence of lung [part of]: Secondary | ICD-10-CM | POA: Insufficient documentation

## 2016-09-06 DIAGNOSIS — Z85118 Personal history of other malignant neoplasm of bronchus and lung: Secondary | ICD-10-CM | POA: Diagnosis not present

## 2016-09-06 DIAGNOSIS — C342 Malignant neoplasm of middle lobe, bronchus or lung: Secondary | ICD-10-CM

## 2016-09-06 LAB — POCT I-STAT CREATININE: Creatinine, Ser: 0.7 mg/dL (ref 0.44–1.00)

## 2016-09-06 MED ORDER — IOPAMIDOL (ISOVUE-300) INJECTION 61%
75.0000 mL | Freq: Once | INTRAVENOUS | Status: AC | PRN
  Administered 2016-09-06: 75 mL via INTRAVENOUS

## 2016-09-08 ENCOUNTER — Inpatient Hospital Stay (HOSPITAL_BASED_OUTPATIENT_CLINIC_OR_DEPARTMENT_OTHER): Payer: Medicare Other | Admitting: Internal Medicine

## 2016-09-08 ENCOUNTER — Inpatient Hospital Stay: Payer: Medicare Other | Attending: Internal Medicine

## 2016-09-08 VITALS — BP 154/89 | HR 63 | Temp 97.3°F | Resp 20 | Ht 63.5 in | Wt 137.4 lb

## 2016-09-08 DIAGNOSIS — Z9221 Personal history of antineoplastic chemotherapy: Secondary | ICD-10-CM | POA: Diagnosis not present

## 2016-09-08 DIAGNOSIS — K219 Gastro-esophageal reflux disease without esophagitis: Secondary | ICD-10-CM | POA: Diagnosis not present

## 2016-09-08 DIAGNOSIS — I341 Nonrheumatic mitral (valve) prolapse: Secondary | ICD-10-CM | POA: Diagnosis not present

## 2016-09-08 DIAGNOSIS — R911 Solitary pulmonary nodule: Secondary | ICD-10-CM | POA: Diagnosis not present

## 2016-09-08 DIAGNOSIS — C342 Malignant neoplasm of middle lobe, bronchus or lung: Secondary | ICD-10-CM

## 2016-09-08 DIAGNOSIS — Z79899 Other long term (current) drug therapy: Secondary | ICD-10-CM | POA: Insufficient documentation

## 2016-09-08 DIAGNOSIS — I700 Atherosclerosis of aorta: Secondary | ICD-10-CM | POA: Diagnosis not present

## 2016-09-08 DIAGNOSIS — J45909 Unspecified asthma, uncomplicated: Secondary | ICD-10-CM | POA: Diagnosis not present

## 2016-09-08 LAB — CBC WITH DIFFERENTIAL/PLATELET
BASOS PCT: 1 %
Basophils Absolute: 0 10*3/uL (ref 0–0.1)
EOS ABS: 0.3 10*3/uL (ref 0–0.7)
EOS PCT: 10 %
HCT: 40.7 % (ref 35.0–47.0)
Hemoglobin: 14 g/dL (ref 12.0–16.0)
LYMPHS ABS: 1 10*3/uL (ref 1.0–3.6)
Lymphocytes Relative: 32 %
MCH: 31.2 pg (ref 26.0–34.0)
MCHC: 34.5 g/dL (ref 32.0–36.0)
MCV: 90.3 fL (ref 80.0–100.0)
Monocytes Absolute: 0.3 10*3/uL (ref 0.2–0.9)
Monocytes Relative: 10 %
Neutro Abs: 1.4 10*3/uL (ref 1.4–6.5)
Neutrophils Relative %: 47 %
PLATELETS: 269 10*3/uL (ref 150–440)
RBC: 4.51 MIL/uL (ref 3.80–5.20)
RDW: 13.6 % (ref 11.5–14.5)
WBC: 3 10*3/uL — AB (ref 3.6–11.0)

## 2016-09-08 LAB — COMPREHENSIVE METABOLIC PANEL
ALK PHOS: 65 U/L (ref 38–126)
ALT: 31 U/L (ref 14–54)
AST: 37 U/L (ref 15–41)
Albumin: 4 g/dL (ref 3.5–5.0)
Anion gap: 8 (ref 5–15)
BILIRUBIN TOTAL: 0.7 mg/dL (ref 0.3–1.2)
BUN: 15 mg/dL (ref 6–20)
CALCIUM: 9.3 mg/dL (ref 8.9–10.3)
CHLORIDE: 101 mmol/L (ref 101–111)
CO2: 28 mmol/L (ref 22–32)
CREATININE: 0.67 mg/dL (ref 0.44–1.00)
GFR calc Af Amer: >60 mL/min (ref >60)
Glucose, Bld: 72 mg/dL (ref 65–99)
Potassium: 3.7 mmol/L (ref 3.5–5.1)
Sodium: 137 mmol/L (ref 135–145)
TOTAL PROTEIN: 7.3 g/dL (ref 6.5–8.1)

## 2016-09-08 NOTE — Assessment & Plan Note (Addendum)
#  STAGE III lung ca [EGFR mutated on adjuvant erlotinib trial- off sec to SEs]; CT scan march 6th- ~38m nodule in RUL; concerning for recurrence vs. New primary.   # Check PET for further evaluation. Labs within normal limits- except wbc- 3.0/ chronic.   #  Discussed clinical trials RN.   # follow up in few days after the PET scan; referral to Dr.Oaks; will discuss at tumor conference. Discussed with Dr. OFaith Roguereviewed the images.  # I reviewed the blood work- with the patient in detail; also reviewed the imaging independently [as summarized above]; and with the patient in detail.

## 2016-09-08 NOTE — Progress Notes (Signed)
[place] OFFICE PROGRESS NOTE  Patient Care Team: [name], MD as PCP - General (Internal Medicine)  Cancer Staging No matching staging information was found for the patient.   Oncology History   1..A right middle lobe adenocarcinoma.  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  2.EGFR mutation present 3.started on adjuvant chemotherapy with cis-platinum and Alimta .  may of 2015 4.Patient has finished 4 cycles of chemotherapy on January 23, 2014 Now patient would be randomized   to  our [facility] protocol for Tarceva vs. placebo because of EGFR mutation 3.patient desires to get off protocol because of progressive side effect even after reducing dose.  (May 13, 2014) 5.Patient is starting  maintenance investigationally approach with Tarceva vs. placebo September of 2015. 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  female pleasant patient above history of Stage III lung cancer status post chemotherapy; EGFR mutated on adjuvant Tarceva clinical trial. Patient come off Tarceva because of significant side effects; Patient is here to review the results of her restaging CAT scan.  In general patient denies any new symptoms. She continues to exercise ; She has mild shortness of breath with exertion. Otherwise no significant cough. Denies any weight loss. Denies any headaches. No chest pain. She denies any headaches.  REVIEW OF SYSTEMS:  A complete 10 point review of system is done which is negative except mentioned above/history of present illness.   PAST MEDICAL HISTORY :  Past Medical History:  Diagnosis Date  . Acid reflux   . Asthma   . Cancer  of right lung ([place]) 2015   lung cancer - chemo, Dr Genevive Bi removed RML Lobectomy.   . Mitral valve prolapse     PAST SURGICAL HISTORY :   Past Surgical History:  Procedure Laterality Date  . ABDOMINAL HYSTERECTOMY    . BREAST BIOPSY Left 1994 (-), 2004 (-)  . COLONOSCOPY  2008    Dr. Donnella Sham  . KNEE SURGERY  2007  . UPPER GI ENDOSCOPY  2008    FAMILY HISTORY :   Family History  Problem Relation Age of Onset  . Breast cancer Maternal Aunt   . Breast cancer Paternal Aunt   . Breast cancer Maternal Grandmother   . Breast cancer Maternal Aunt   . Breast cancer Paternal Aunt     SOCIAL HISTORY:   Social History  Substance Use Topics  . Smoking status: Never Smoker  . Smokeless tobacco: Not on file  . Alcohol use Yes    ALLERGIES:  is allergic to sulfa antibiotics and penicillins.  MEDICATIONS:  Current Outpatient Prescriptions  Medication Sig Dispense Refill  . B Complex Vitamins (VITAMIN B COMPLEX PO) Take by mouth.    . Biotin 800 MCG TABS Take 1 tablet by mouth daily.     . Cholecalciferol (D3 MAXIMUM STRENGTH) 5000 units capsule Take 5,000 Units by mouth daily.    Marland Kitchen estrogens, conjugated, (PREMARIN) 1.25 MG tablet Take 1 tablet by mouth daily.    . fexofenadine (ALLEGRA) 180 MG tablet Take 180 mg by mouth as needed for allergies.     Marland Kitchen  fluticasone (FLONASE) 50 MCG/ACT nasal spray USE 2 SPRAYS IN EACH NOSTRIL AT BEDTIME    . lansoprazole (PREVACID) 30 MG capsule Take 30 mg by mouth daily.    . meloxicam (MOBIC) 15 MG tablet Take 1 tablet by mouth daily.    Marland Kitchen albuterol (PROAIR HFA) 108 (90 BASE) MCG/ACT inhaler INHALE 2 PUFFS AS DIRECTED AS NEEDED    . estradiol (ESTRACE) 0.5 MG tablet Take by mouth.    . Fluticasone-Salmeterol (ADVAIR DISKUS) 250-50 MCG/DOSE AEPB INHALE ONE PUFF INTO THE LUNGS 2 TIMES DAILY.     No current facility-administered medications for this visit.     PHYSICAL EXAMINATION: ECOG PERFORMANCE STATUS: 0 - Asymptomatic  BP (!) 154/89 (BP  Location: Left Arm, Patient Position: Sitting)   Pulse 63   Temp 97.3 F (36.3 C) (Tympanic)   Resp 20   Ht 5' 3.5" (1.613 m)   Wt 137 lb 6.4 oz (62.3 kg)   BMI 23.96 kg/m   Filed Weights   09/08/16 1039  Weight: 137 lb 6.4 oz (62.3 kg)    GENERAL: Well-nourished well-developed; Alert, no distress and comfortable.  Alone.  EYES: no pallor or icterus OROPHARYNX: no thrush or ulceration; good dentition  NECK: supple, no masses felt LYMPH:  no palpable lymphadenopathy in the cervical, axillary or inguinal regions LUNGS: clear to auscultation and  No wheeze or crackles HEART/CVS: regular rate & rhythm and no murmurs; No lower extremity edema ABDOMEN:abdomen soft, non-tender and normal bowel sounds Musculoskeletal:no cyanosis of digits and no clubbing  PSYCH: alert & oriented x 3 with fluent speech NEURO: no focal motor/sensory deficits SKIN:  no rashes or significant lesions  LABORATORY DATA:  I have reviewed the data as listed    Component Value Date/Time   NA 137 09/08/2016 1022   NA 141 06/03/2014 1039   K 3.7 09/08/2016 1022   K 4.1 06/03/2014 1039   CL 101 09/08/2016 1022   CL 105 06/03/2014 1039   CO2 28 09/08/2016 1022   CO2 28 06/03/2014 1039   GLUCOSE 72 09/08/2016 1022   GLUCOSE 88 06/03/2014 1039   BUN 15 09/08/2016 1022   BUN 14 06/03/2014 1039   CREATININE 0.67 09/08/2016 1022   CREATININE 0.68 06/03/2014 1039   CALCIUM 9.3 09/08/2016 1022   CALCIUM 8.7 06/03/2014 1039   PROT 7.3 09/08/2016 1022   PROT 7.6 06/03/2014 1039   ALBUMIN 4.0 09/08/2016 1022   ALBUMIN 3.7 06/03/2014 1039   AST 37 09/08/2016 1022   AST 36 06/03/2014 1039   ALT 31 09/08/2016 1022   ALT 34 06/03/2014 1039   ALKPHOS 65 09/08/2016 1022   ALKPHOS 86 06/03/2014 1039   BILITOT 0.7 09/08/2016 1022   BILITOT 0.4 06/03/2014 1039   GFRNONAA >60 09/08/2016 1022   GFRNONAA >60 06/03/2014 1039   GFRNONAA >60 03/20/2014 1109   GFRAA >60 09/08/2016 1022   GFRAA >60 06/03/2014 1039    GFRAA >60 03/20/2014 1109    No results found for: SPEP, UPEP  Lab Results  Component Value Date   WBC 3.0 (L) 09/08/2016   NEUTROABS 1.4 09/08/2016   HGB 14.0 09/08/2016   HCT 40.7 09/08/2016   MCV 90.3 09/08/2016   PLT 269 09/08/2016      Chemistry      Component Value Date/Time   NA 137 09/08/2016 1022   NA 141 06/03/2014 1039   K 3.7 09/08/2016 1022   K 4.1 06/03/2014 1039   CL 101 09/08/2016 1022   CL  105 06/03/2014 1039   CO2 28 09/08/2016 1022   CO2 28 06/03/2014 1039   BUN 15 09/08/2016 1022   BUN 14 06/03/2014 1039   CREATININE 0.67 09/08/2016 1022   CREATININE 0.68 06/03/2014 1039      Component Value Date/Time   CALCIUM 9.3 09/08/2016 1022   CALCIUM 8.7 06/03/2014 1039   ALKPHOS 65 09/08/2016 1022   ALKPHOS 86 06/03/2014 1039   AST 37 09/08/2016 1022   AST 36 06/03/2014 1039   ALT 31 09/08/2016 1022   ALT 34 06/03/2014 1039   BILITOT 0.7 09/08/2016 1022   BILITOT 0.4 06/03/2014 1039    1. As before there is faint nodularity along the major fissure compatible with small subpleural lymph nodes, not appreciably changed. Appearance favors benign etiology. No recurrence identified. 2. 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 3. Mild left eccentric central narrowing of the thecal sac at T6-7 due to intervertebral spurring. 4. 5 mm hypodense right hepatic lobe lesion, no change over 2 years, considered benign. Likely a cyst.   Electronically Signed   By: Van Clines M.D.   On: 09/24/2015 14:17   RADIOGRAPHIC STUDIES: I have personally reviewed the radiological images as listed and agreed with the findings in the report. No results found.   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 New primary.   # Check PET for further evaluation. Labs within normal  limits- except wbc- 3.0/ chronic.   #  Discussed clinical trials RN.   # follow up in few days after the PET scan; referral to Dr.Oaks; will discuss at tumor conference. Discussed with Dr. OFaith Roguereviewed the images.  # I reviewed the blood work- with the patient in detail; also reviewed the imaging independently [as summarized above]; and with the patient in detail.     Orders Placed This Encounter  Procedures  . NM PET Image Restag (PS) Skull Base To Thigh    Standing Status:   Future    Standing Expiration Date:   11/08/2017    Order Specific Question:   Reason for Exam (SYMPTOM  OR DIAGNOSIS REQUIRED)    Answer:   lung nodule    Order Specific Question:   [facility]  All questions were answered. The patient knows to call the clinic with any problems, questions or concerns.      GCammie Sickle MD 09/09/2016 5:06 PM

## 2016-09-13 ENCOUNTER — Other Ambulatory Visit: Payer: Self-pay | Admitting: *Deleted

## 2016-09-13 ENCOUNTER — Ambulatory Visit
Admission: RE | Admit: 2016-09-13 | Discharge: 2016-09-13 | Disposition: A | Discharge status: Home / Self Care | Payer: Medicare Other | Source: Ambulatory Visit | Attending: Internal Medicine | Admitting: Internal Medicine

## 2016-09-13 DIAGNOSIS — J323 Chronic sphenoidal sinusitis: Secondary | ICD-10-CM | POA: Diagnosis not present

## 2016-09-13 DIAGNOSIS — C342 Malignant neoplasm of middle lobe, bronchus or lung: Secondary | ICD-10-CM

## 2016-09-13 DIAGNOSIS — Z79899 Other long term (current) drug therapy: Secondary | ICD-10-CM | POA: Diagnosis not present

## 2016-09-13 DIAGNOSIS — I700 Atherosclerosis of aorta: Secondary | ICD-10-CM | POA: Diagnosis not present

## 2016-09-13 DIAGNOSIS — R911 Solitary pulmonary nodule: Secondary | ICD-10-CM | POA: Diagnosis present

## 2016-09-13 LAB — GLUCOSE, CAPILLARY: Glucose-Capillary: 79 mg/dL (ref 65–99)

## 2016-09-13 MED ORDER — FLUDEOXYGLUCOSE F - 18 (FDG) INJECTION
12.0000 | Freq: Once | INTRAVENOUS | Status: AC | PRN
  Administered 2016-09-13: 12.34 via INTRAVENOUS

## 2016-09-15 ENCOUNTER — Inpatient Hospital Stay (HOSPITAL_BASED_OUTPATIENT_CLINIC_OR_DEPARTMENT_OTHER): Payer: Medicare Other | Admitting: Internal Medicine

## 2016-09-15 ENCOUNTER — Inpatient Hospital Stay: Payer: Medicare Other

## 2016-09-15 ENCOUNTER — Other Ambulatory Visit: Payer: Self-pay

## 2016-09-15 VITALS — BP 153/83 | HR 63 | Temp 97.7°F | Resp 18 | Ht 63.5 in | Wt 136.1 lb

## 2016-09-15 DIAGNOSIS — R911 Solitary pulmonary nodule: Secondary | ICD-10-CM | POA: Diagnosis not present

## 2016-09-15 DIAGNOSIS — Z9221 Personal history of antineoplastic chemotherapy: Secondary | ICD-10-CM

## 2016-09-15 DIAGNOSIS — C342 Malignant neoplasm of middle lobe, bronchus or lung: Secondary | ICD-10-CM | POA: Diagnosis not present

## 2016-09-15 MED ORDER — CEPHALEXIN 500 MG PO CAPS: 500.0000 mg | ORAL_CAPSULE | Freq: Three times a day (TID) | ORAL | 0 refills | Status: DC

## 2016-09-15 NOTE — Progress Notes (Signed)
[facility] OFFICE PROGRESS NOTE  Patient Care Team: Marguarite Arbour, MD as PCP - General (Internal Medicine)  Cancer Staging No matching staging information was found for the patient.   Oncology History   1..A right middle lobe adenocarcinoma.  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  2.EGFR mutation present 3.started on adjuvant chemotherapy with cis-platinum and Alimta .  may of 2015 4.Patient has finished 4 cycles of chemotherapy on January 23, 2014 Now patient would be randomized   to  our [facility] protocol for Tarceva vs. placebo because of EGFR mutation 3.patient desires to get off protocol because of progressive side effect even after reducing dose.  (May 13, 2014) 5.Patient is starting  maintenance investigationally approach with Tarceva vs. placebo September of 2015. 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  female pleasant patient above history of Stage III lung cancer status post chemotherapy; EGFR mutated on adjuvant Tarceva clinical trial. Patient come off Tarceva because of significant side effects; Patient is here to review the results of her PET scan that was ordered for an abnormal CT scan.   She has mild shortness of breath with exertion. Otherwise no significant cough. Denies any weight loss. Denies any headaches. No chest pain. She denies any headaches.  REVIEW OF SYSTEMS:  A complete 10 point review of system is done which is negative except mentioned above/history of present illness.   PAST MEDICAL HISTORY :  Past Medical History:  Diagnosis Date  . Acid reflux   . Asthma   . Cancer of right lung (HCC) 2015   lung cancer -  chemo, Dr Thelma Barge removed RML Lobectomy.   . Mitral valve prolapse     PAST SURGICAL HISTORY :   Past Surgical History:  Procedure Laterality Date  . ABDOMINAL HYSTERECTOMY    . BREAST BIOPSY Left 1994 (-), 2004 (-)  . COLONOSCOPY  2008    Dr. Ricki Rodriguez  . KNEE SURGERY  2007  . UPPER GI ENDOSCOPY  2008    FAMILY HISTORY :   Family History  Problem Relation Age of Onset  . Breast cancer Maternal Aunt   . Breast cancer Paternal Aunt   . Breast cancer Maternal Grandmother   . Breast cancer Maternal Aunt   . Breast cancer Paternal Aunt   . Healthy Mother   . Healthy Father     SOCIAL HISTORY:   Social History  Substance Use Topics  . Smoking status: Never Smoker  . Smokeless tobacco: Never Used  . Alcohol use Yes     Comment: 1 Glass Wine / Night    ALLERGIES:  is allergic to penicillins and sulfa antibiotics.  MEDICATIONS:  Current Outpatient Prescriptions  Medication Sig Dispense Refill  . albuterol (PROAIR HFA) 108 (90 BASE) MCG/ACT inhaler INHALE 2 PUFFS AS DIRECTED AS NEEDED    . B Complex Vitamins (VITAMIN B COMPLEX PO) Take by mouth.    . Biotin 800 MCG TABS Take 1 tablet by mouth daily.     . Cholecalciferol (D3 MAXIMUM STRENGTH) 5000 units capsule Take 5,000 Units by mouth daily.    Marland Kitchen  estrogens, conjugated, (PREMARIN) 1.25 MG tablet Take 1 tablet by mouth daily.    . fexofenadine (ALLEGRA) 180 MG tablet Take 180 mg by mouth as needed for allergies.     . fluticasone (FLONASE) 50 MCG/ACT nasal spray USE 2 SPRAYS IN EACH NOSTRIL AT BEDTIME    . Fluticasone-Salmeterol (ADVAIR DISKUS) 250-50 MCG/DOSE AEPB INHALE ONE PUFF INTO THE LUNGS 2 TIMES DAILY.    Marland Kitchen lansoprazole (PREVACID) 30 MG capsule Take 30 mg by mouth daily.    . meloxicam (MOBIC) 15 MG tablet Take 1 tablet by mouth daily.    . cephALEXin (KEFLEX) 500 MG capsule Take 1 capsule (500 mg total) by mouth 3 (three) times daily. 15 capsule 0   No current facility-administered medications for this visit.      PHYSICAL EXAMINATION: ECOG PERFORMANCE STATUS: 0 - Asymptomatic  BP (!) 153/83 (BP Location: Left Arm, Patient Position: Sitting)   Pulse 63   Temp 97.7 F (36.5 C) (Oral)   Resp 18   Ht 5' 3.5" (1.613 m)   Wt 136 lb 2 oz (61.7 kg)   BMI 23.74 kg/m   Filed Weights   09/15/16 1131  Weight: 136 lb 2 oz (61.7 kg)    GENERAL: Well-nourished well-developed; Alert, no distress and comfortable.  Alone.  EYES: no pallor or icterus OROPHARYNX: no thrush or ulceration; good dentition  NECK: supple, no masses felt LYMPH:  no palpable lymphadenopathy in the cervical, axillary or inguinal regions LUNGS: clear to auscultation and  No wheeze or crackles HEART/CVS: regular rate & rhythm and no murmurs; No lower extremity edema ABDOMEN:abdomen soft, non-tender and normal bowel sounds Musculoskeletal:no cyanosis of digits and no clubbing  PSYCH: alert & oriented x 3 with fluent speech NEURO: no focal motor/sensory deficits SKIN:  no rashes or significant lesions  LABORATORY DATA:  I have reviewed the data as listed    Component Value Date/Time   NA 137 09/08/2016 1022   NA 141 06/03/2014 1039   K 3.7 09/08/2016 1022   K 4.1 06/03/2014 1039   CL 101 09/08/2016 1022   CL 105 06/03/2014 1039   CO2 28 09/08/2016 1022   CO2 28 06/03/2014 1039   GLUCOSE 72 09/08/2016 1022   GLUCOSE 88 06/03/2014 1039   BUN 15 09/08/2016 1022   BUN 14 06/03/2014 1039   CREATININE 0.67 09/08/2016 1022   CREATININE 0.68 06/03/2014 1039   CALCIUM 9.3 09/08/2016 1022   CALCIUM 8.7 06/03/2014 1039   PROT 7.3 09/08/2016 1022   PROT 7.6 06/03/2014 1039   ALBUMIN 4.0 09/08/2016 1022   ALBUMIN 3.7 06/03/2014 1039   AST 37 09/08/2016 1022   AST 36 06/03/2014 1039   ALT 31 09/08/2016 1022   ALT 34 06/03/2014 1039   ALKPHOS 65 09/08/2016 1022   ALKPHOS 86 06/03/2014 1039   BILITOT 0.7 09/08/2016 1022   BILITOT 0.4 06/03/2014 1039   GFRNONAA >60 09/08/2016 1022   GFRNONAA >60 06/03/2014 1039    GFRNONAA >60 03/20/2014 1109   GFRAA >60 09/08/2016 1022   GFRAA >60 06/03/2014 1039   GFRAA >60 03/20/2014 1109    No results found for: SPEP, UPEP  Lab Results  Component Value Date   WBC 3.0 (L) 09/08/2016   NEUTROABS 1.4 09/08/2016   HGB 14.0 09/08/2016   HCT 40.7 09/08/2016   MCV 90.3 09/08/2016   PLT 269 09/08/2016      Chemistry      Component Value Date/Time   NA 137 09/08/2016 1022  NA 141 06/03/2014 1039   K 3.7 09/08/2016 1022   K 4.1 06/03/2014 1039   CL 101 09/08/2016 1022   CL 105 06/03/2014 1039   CO2 28 09/08/2016 1022   CO2 28 06/03/2014 1039   BUN 15 09/08/2016 1022   BUN 14 06/03/2014 1039   CREATININE 0.67 09/08/2016 1022   CREATININE 0.68 06/03/2014 1039      Component Value Date/Time   CALCIUM 9.3 09/08/2016 1022   CALCIUM 8.7 06/03/2014 1039   ALKPHOS 65 09/08/2016 1022   ALKPHOS 86 06/03/2014 1039   AST 37 09/08/2016 1022   AST 36 06/03/2014 1039   ALT 31 09/08/2016 1022   ALT 34 06/03/2014 1039   BILITOT 0.7 09/08/2016 1022   BILITOT 0.4 06/03/2014 1039    IMPRESSION: 1. The area of nodularity in the superior segment right lower lobe is less prominent than on the recent CT, has very low metabolic activity with maximum SUV of 1.1, and also has adjacent reticulonodular opacity. I suspect that this is an inflammatory process given the low level activity ; consider surveillance given the patient' s history. 2. Faintly accentuated metabolic activity along the right anterior sixth rib, likely due to remote trauma. Prior right fifth rib osteotomy. 3. Other imaging findings of potential clinical significance: Chronic left sphenoid sinusitis. Mild aortic atherosclerosis.   Electronically Signed   By: Van Clines M.D.   On: 09/13/2016 13:44   RADIOGRAPHIC STUDIES: I have personally reviewed the radiological images as listed and agreed with the findings in the report. Mm Screening Breast Tomo Bilateral  Result Date:  09/16/2016 CLINICAL DATA:  Screening. EXAM: 2D DIGITAL SCREENING BILATERAL MAMMOGRAM WITH CAD AND ADJUNCT TOMO COMPARISON:  Previous exam(s). ACR Breast Density Category c: The breast tissue is heterogeneously dense, which may obscure small masses. FINDINGS: There are no findings suspicious for malignancy. Images were processed with CAD. IMPRESSION: No mammographic evidence of malignancy. A result letter of this screening mammogram will be mailed directly to the patient. RECOMMENDATION: Screening mammogram in one year. (Code:SM-B-01Y) BI-RADS CATEGORY  1: Negative. Electronically Signed   By: Ammie Ferrier M.D.   On: 09/16/2016 15:36     ASSESSMENT & PLAN:  Primary cancer of right middle lobe of lung ([place]) # STAGE III lung ca [EGFR mutated on adjuvant erlotinib trial- off sec to SEs]; CT scan march 6th- ~53m nodule in RUL; However, the PET scan shows- minimal uptake improved compared to the recent CT scan. Highly doubt recurrent malignancy based on imaging. However patient interested in biopsy; or "taking it out"- in spite of my recommendations against. We will also review at the tumor conference.   # I reviewed the blood work- with the patient in detail; also reviewed the imaging independently [as summarized above]; and with the patient in detail.   # Addendum reviewed at the tumor conference- based on imaging findings recommend follow-up CT scan in 3 months. This would be relayed to the patient. Also await appointment with Dr. OFaith Rogue   No orders of the defined types were placed in this encounter.  All questions were answered. The patient knows to call the clinic with any problems, questions or concerns.      GCammie Sickle MD 09/18/2016 12:20 PM

## 2016-09-15 NOTE — Assessment & Plan Note (Addendum)
#  STAGE III lung ca [EGFR mutated on adjuvant erlotinib trial- off sec to SEs]; CT scan march 6th- ~66m nodule in RUL; However, the PET scan shows- minimal uptake improved compared to the recent CT scan. Highly doubt recurrent malignancy based on imaging. However patient interested in biopsy; or "taking it out"- in spite of my recommendations against. We will also review at the tumor conference.   # I reviewed the blood work- with the patient in detail; also reviewed the imaging independently [as summarized above]; and with the patient in detail.   # Addendum reviewed at the tumor conference- based on imaging findings recommend follow-up CT scan in 3 months. This would be relayed to the patient. Also await appointment with Dr. OFaith Rogue

## 2016-09-15 NOTE — Progress Notes (Signed)
Patient here today for follow up.  Patient c/o sinus congestion, cough and headache

## 2016-09-16 ENCOUNTER — Encounter: Payer: Self-pay | Admitting: Cardiothoracic Surgery

## 2016-09-16 ENCOUNTER — Ambulatory Visit
Admission: RE | Admit: 2016-09-16 | Discharge: 2016-09-16 | Disposition: A | Discharge status: Home / Self Care | Payer: Medicare Other | Source: Ambulatory Visit | Attending: Internal Medicine | Admitting: Internal Medicine

## 2016-09-16 ENCOUNTER — Ambulatory Visit (INDEPENDENT_AMBULATORY_CARE_PROVIDER_SITE_OTHER): Payer: Medicare Other | Admitting: Cardiothoracic Surgery

## 2016-09-16 VITALS — BP 149/99 | HR 62 | Temp 98.0°F | Resp 18 | Ht 64.0 in | Wt 135.2 lb

## 2016-09-16 DIAGNOSIS — Z1231 Encounter for screening mammogram for malignant neoplasm of breast: Secondary | ICD-10-CM | POA: Diagnosis present

## 2016-09-16 DIAGNOSIS — R911 Solitary pulmonary nodule: Secondary | ICD-10-CM | POA: Diagnosis not present

## 2016-09-16 NOTE — Patient Instructions (Addendum)
We have scheduled you for a CT Scan of your Chest. This has been scheduled at [facility] on 12/16/16 at 0800. Arrive at 0745am.    We will see you back as soon as CT has been completed. Please see appointment below.  Bring a list of medications with you to your appointment.  If you have an allergy to IVP dye or CT contrast and this has not been addressed, please contact our office [phone] and ask to speak with a nurse to address this at least 48 hours prior to your scheduled test.  Clear liquids 4 hours prior to your CT Scan.  If you need to reschedule your Scan, you may do so by calling [phone].

## 2016-09-16 NOTE — Addendum Note (Signed)
Addended by: Phillips Odor on: 09/16/2016 11:03 AM   Modules accepted: Orders

## 2016-09-16 NOTE — Progress Notes (Signed)
  Patient ID: Lauren Page, female   DOB: [birth year], 66 y.o.   MRN: [id number]  HISTORY: This patient returns today in follow-up. I was asked to see her by Dr. Rogue Bussing regarding a possible recurrence of her lung cancer. She is now about 2-1/2 years out from a right thoracotomy and right middle lobectomy for a adenocarcinoma the lung. This was found to have invaded the pleura but a follow-up CT scan is have not revealed any recurrence. About a year ago she did have a small nodule identified along the superior segment right lower lobe close to the area of the fissure. This increased and a recent PET scan was done which showed that the lesion had essentially gotten significantly smaller and appeared to be resolving. The patient was seen by Dr. Rogue Bussing and further follow-up in she is quite worried about the results. She believes that the PET scan and the CT scan are not congruous and would like to CT scan repeated. She otherwise is asymptomatic.   Vitals:   09/16/16 1008  BP: (!) 149/99  Pulse: 62  Resp: 18  Temp: 98 F (36.7 C)     EXAM:    Resp: Lungs are clear bilaterally.  No respiratory distress, normal effort. Heart:  Regular without murmurs Abd:  Abdomen is soft, non distended and non tender. No masses are palpable.  There is no rebound and no guarding.  Neurological: Alert and oriented to person, place, and time. Coordination normal.  Skin: Skin is warm and dry. No rash noted. No diaphoretic. No erythema. No pallor.  the thoracotomy wound is well-healed without nodules.  Psychiatric: Normal mood and affect. Normal behavior. Judgment and thought content normal.    ASSESSMENT: After extensive discussion with the patient and her husband they would like to have a referral to [facility] or repeat the CT scan in 3 months. They are not comfortable with waiting any longer than that. I explained to them that they would need additional follow-up for the rest of her life and that I  was not able to provide that to them. They understand that they'll need to seek someone who can follow them for their lung cancer.   PLAN:   I will go ahead and order a CT scan the chest without contrast. I will obtain that in 3 months. I will discuss those results with the patient and then she can determine where she would like to go for additional follow-up once those results are in.    Nestor Lewandowsky, MD

## 2016-10-31 DIAGNOSIS — I10 Essential (primary) hypertension: Secondary | ICD-10-CM | POA: Insufficient documentation

## 2016-12-13 ENCOUNTER — Other Ambulatory Visit: Payer: Self-pay

## 2016-12-16 ENCOUNTER — Ambulatory Visit
Admission: RE | Admit: 2016-12-16 | Discharge: 2016-12-16 | Disposition: A | Discharge status: Home / Self Care | Payer: Medicare Other | Source: Ambulatory Visit | Attending: Cardiothoracic Surgery | Admitting: Cardiothoracic Surgery

## 2016-12-16 ENCOUNTER — Encounter: Payer: Self-pay | Admitting: Cardiothoracic Surgery

## 2016-12-16 ENCOUNTER — Ambulatory Visit: Payer: Medicare Other

## 2016-12-16 DIAGNOSIS — I700 Atherosclerosis of aorta: Secondary | ICD-10-CM | POA: Insufficient documentation

## 2016-12-16 DIAGNOSIS — R590 Localized enlarged lymph nodes: Secondary | ICD-10-CM | POA: Diagnosis not present

## 2016-12-16 DIAGNOSIS — R911 Solitary pulmonary nodule: Secondary | ICD-10-CM | POA: Diagnosis present

## 2016-12-16 DIAGNOSIS — R918 Other nonspecific abnormal finding of lung field: Secondary | ICD-10-CM | POA: Diagnosis not present

## 2016-12-16 LAB — POCT I-STAT CREATININE: Creatinine, Ser: 0.7 mg/dL (ref 0.44–1.00)

## 2016-12-16 MED ORDER — IOPAMIDOL (ISOVUE-300) INJECTION 61%
75.0000 mL | Freq: Once | INTRAVENOUS | Status: AC | PRN
  Administered 2016-12-16: 75 mL via INTRAVENOUS

## 2016-12-23 ENCOUNTER — Ambulatory Visit (INDEPENDENT_AMBULATORY_CARE_PROVIDER_SITE_OTHER): Payer: Medicare Other | Admitting: Cardiothoracic Surgery

## 2016-12-23 ENCOUNTER — Encounter: Payer: Self-pay | Admitting: Cardiothoracic Surgery

## 2016-12-23 VITALS — BP 156/86 | HR 63 | Temp 97.8°F | Resp 18 | Ht 64.0 in | Wt 133.6 lb

## 2016-12-23 DIAGNOSIS — R918 Other nonspecific abnormal finding of lung field: Secondary | ICD-10-CM | POA: Diagnosis not present

## 2016-12-23 NOTE — Progress Notes (Signed)
  Patient ID: Lauren Page, female   DOB: 12-17-1950, 66 y.o.   MRN: [id number]  HISTORY: This patient is a 66 year old woman who is status post right middle lobectomy several years ago. Recently she was found to have a nodular density in the superior segment of the right lower lobe. A subsequent PET scan was performed which revealed no evidence of uptake in the hypermetabolic range. A repeat CT scan and been performed and I compared this to the prior scan 3 months ago. There are some bilateral hilar adenopathy which is increased slightly in size. The largest lymph node now measures over 1.5 cm in the right hilum. The left hilum hasn't 9 mm lymph node. These have increased from before. The other nodules in the lungs are essentially unchanged. The patient states that she does get short of breath with activities. Particularly whenever the weather is quite hot and humid. She has not smoked in has never smoked. She does say that she has some allergies and has recently developed a cough. She believes that this may be related to the weather as well.   Vitals:   12/23/16 1052  BP: (!) 156/86  Pulse: 63  Resp: 18  Temp: 97.8 F (36.6 C)     EXAM:    Resp: Lungs are clear bilaterally.  No respiratory distress, normal effort. Heart:  Regular without murmurs Abd:  Abdomen is soft, non distended and non tender. No masses are palpable.  There is no rebound and no guarding.  Neurological: Alert and oriented to person, place, and time. Coordination normal.  Skin: Skin is warm and dry. No rash noted. No diaphoretic. No erythema. No pallor.  Psychiatric: Normal mood and affect. Normal behavior. Judgment and thought content normal.    ASSESSMENT: Persistent opacities right lower lobe an enlarging right hilar and left hilar adenopathy in the setting of prior lung cancer surgery.   PLAN:   I did ask her to see Dr. Twanna Hy consideration of endobronchial biopsy. This may be performed with  navigational bronchoscopy or a bullous. The navigational bronchoscopy may allow [place] to biopsy the right lower lobe parenchyma in addition. We will set up a consultation with Dr. Patricia Pesa. I explained to the patient that she should contact me after the biopsies are completed so that I can review with her how best to proceed.    Nestor Lewandowsky, MD

## 2016-12-23 NOTE — Patient Instructions (Signed)
We will send the referral to [facility] office. Someone from their office will call and schedule an appointment. If you have not heard from their office by 12/30/16 please call our office and let us know.  We will schedule an appointment with Dr.Oaks after you have this procedure done. Please call our office if you have any questions or concerns.

## 2016-12-26 ENCOUNTER — Telehealth: Payer: Self-pay | Admitting: Internal Medicine

## 2016-12-26 NOTE — Telephone Encounter (Signed)
Recommend put her in 815 AM spot/appointment the next time I am in clinic

## 2016-12-26 NOTE — Telephone Encounter (Signed)
Patient wants to know if Dr. Mortimer Fries can see her any sooner new patient referral for nodule  Per patient these have been present before but have increased in size and she is concerned about waiting any longer.

## 2016-12-26 NOTE — Telephone Encounter (Signed)
Patient ok with seeing Ram scheduled tomorro at 4

## 2016-12-26 NOTE — Progress Notes (Addendum)
[facility] Consultation    Update: Pt seen and evaluated before pt going for procedure, new changes in exam have been noted.     Assessment and Plan:  Lung cancer. -Explained results of PET scan, explained that the peripheral lung nodule is not accessible via ENB bronchoscopy. Interventional radiology has reviewed images, felt that the right lung nodule was very low risk for cancer and thus would not recommend biopsy at this time. -Slightly enlarged hilar and paratracheal lymph nodes,explained that these were PET negative. Also, however, patient remains very concerned about the possibility/likelihood of recurrent cancer. -Explained to patient that the likelihood of the cancer is fairly low here, however, I would be happy to perform this procedure to help assuage their fears and rule out recurrent cancer. Explained  Major risks of the procedure including infection, bleeding, lung collapse, need for prolonged mechanical ventilatory support.  Hilar lymphadenopathy. -as above, hilar and paratracheal lymphadenopathy. We'll schedule EBUS bronchoscopy.  COPD/emphysema. -Continue current inhaled therapy.     Date: 12/26/2016  MRN# [id number] Lauren Page Mar 12, 1951    Lauren Page is a 66 y.o. old female seen in consultation for chief complaint of:    Chief Complaint  Patient presents with  . Advice Only    Referred By Dr. Genevive Bi  . Lung Mass    HPI:   This patient is a 66 year old woman who is status post right middle lobectomy several years ago. 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 A  subsequent PET scan was performed which revealed no evidence of uptake in the hypermetabolic range. A repeat CT scan compared this to the prior scan 3 months ago. There are some bilateral hilar adenopathy which is increased slightly in size. The largest lymph node now measures over 1.5 cm in the right hilum. The left hilum has a 9 mm lymph node. These have increased from before. The other nodules in the lungs are essentially unchanged. The patient denies any new symptoms.   She is here with her husband. She feels that the PET scan was a "wasted" test as it did not show anything, reviewed this with her, and explained the negative results. However she remains very concerned that the CT showed changes in the nodule/lymph node size. I explained that there is some degree of error in these tests, however she maintains that she is extremely concerned about cancer recurrence.   I personally reviewed, images, CT chest 12/16/16, mild subcarinal and paratracheal lymphadenopathy, 1.5 cm right hilar node. This appears to be slightly larger than previous CT scan performed on 09/06/16. PET scan performed on 3/13 was negative, including the nodular area seen in the superior segment of the right lower lobe.   PMHX:   Past Medical History:  Diagnosis Date  . Acid reflux   . Asthma   . Cancer of right lung ([place]) 2015   lung cancer - chemo, Dr Genevive Bi removed RML Lobectomy.   . Mitral valve prolapse    Surgical Hx:  Past Surgical History:  Procedure Laterality Date  . ABDOMINAL HYSTERECTOMY    .  BREAST BIOPSY Left 1994 (-), 2004 (-)  . COLONOSCOPY  2008    Dr. Donnella Sham  . KNEE SURGERY  2007  . UPPER GI ENDOSCOPY  2008   Family Hx:  Family History  Problem Relation Age of Onset  . Breast cancer Maternal Aunt   . Breast cancer Paternal Aunt   . Breast cancer Maternal Grandmother   . Breast cancer Maternal Aunt   . Breast cancer Paternal Aunt   . Healthy Mother   . Healthy Father    Social Hx:   Social  History  Substance Use Topics  . Smoking status: Never Smoker  . Smokeless tobacco: Never Used  . Alcohol use Yes     Comment: 1 Glass Wine / Night   Medication:    Current Outpatient Prescriptions:  .  albuterol (PROAIR HFA) 108 (90 BASE) MCG/ACT inhaler, INHALE 2 PUFFS AS DIRECTED AS NEEDED, Disp: , Rfl:  .  B Complex Vitamins (VITAMIN B COMPLEX PO), Take by mouth., Disp: , Rfl:  .  Biotin 800 MCG TABS, Take 1 tablet by mouth daily. , Disp: , Rfl:  .  cephALEXin (KEFLEX) 500 MG capsule, Take 1 capsule (500 mg total) by mouth 3 (three) times daily., Disp: 15 capsule, Rfl: 0 .  Cholecalciferol (D3 MAXIMUM STRENGTH) 5000 units capsule, Take 5,000 Units by mouth daily., Disp: , Rfl:  .  estrogens, conjugated, (PREMARIN) 1.25 MG tablet, Take 1 tablet by mouth daily., Disp: , Rfl:  .  fexofenadine (ALLEGRA) 180 MG tablet, Take 180 mg by mouth as needed for allergies. , Disp: , Rfl:  .  fluticasone (FLONASE) 50 MCG/ACT nasal spray, USE 2 SPRAYS IN EACH NOSTRIL AT BEDTIME, Disp: , Rfl:  .  Fluticasone-Salmeterol (ADVAIR DISKUS) 250-50 MCG/DOSE AEPB, INHALE ONE PUFF INTO THE LUNGS 2 TIMES DAILY., Disp: , Rfl:  .  lansoprazole (PREVACID) 30 MG capsule, Take 30 mg by mouth daily., Disp: , Rfl:  .  losartan (COZAAR) 25 MG tablet, Take 1 tablet by mouth at bedtime., Disp: , Rfl:  .  meloxicam (MOBIC) 15 MG tablet, Take 1 tablet by mouth daily., Disp: , Rfl:  .  Vitamin D, Ergocalciferol, (DRISDOL) 50000 units CAPS capsule, , Disp: , Rfl: 11   Allergies:  Penicillins and Sulfa antibiotics  Review of Systems: Gen:  Denies  fever, sweats, chills HEENT: Denies blurred vision, double vision. bleeds, sore throat Cvc:  No dizziness, chest pain. Resp:   Denies cough or sputum production, shortness of breath Gi: Denies swallowing difficulty, stomach pain. Gu:  Denies bladder incontinence, burning urine Ext:   No Joint pain, stiffness. Skin: No skin rash,  hives  Endoc:  No polyuria,  polydipsia. Psych: No depression, insomnia. Other:  All other systems were reviewed with the patient and were negative other that what is mentioned in the HPI.   Physical Examination:   VS: BP 104/70 (BP Location: Left Arm, Cuff Size: Normal)   Pulse 72   Resp 16   Ht 5\' 4"  (1.626 m)   Wt 130 lb (59 kg)   SpO2 96%   BMI 22.31 kg/m   General Appearance: No distress  Neuro:without focal findings,  speech normal,  HEENT: PERRLA, EOM intact.   Pulmonary: normal breath sounds, No wheezing.  CardiovascularNormal S1,S2.  No m/r/g.   Abdomen: Benign, Soft, non-tender. Renal:  No costovertebral tenderness  GU:  No performed at this time. Endoc: No evident thyromegaly, no signs of acromegaly. Skin:   warm, no rashes, no ecchymosis  Extremities: normal, no cyanosis, clubbing.  Other findings:    LABORATORY PANEL:   CBC No results for input(s): WBC, HGB, HCT, PLT in the last 168 hours. ------------------------------------------------------------------------------------------------------------------  Chemistries  No results for input(s): NA, K, CL, CO2, GLUCOSE, BUN, CREATININE, CALCIUM, MG, AST, ALT, ALKPHOS, BILITOT in the last 168 hours.  Invalid input(s): GFRCGP ------------------------------------------------------------------------------------------------------------------  Cardiac Enzymes No results for input(s): TROPONINI in the last 168 hours. ------------------------------------------------------------  RADIOLOGY:  No results found.     Thank  you for the consultation and for allowing [facility] to assist in the care of your patient. Our recommendations are noted above.  Please contact us if we can be of further service.   Marda Stalker, MD.  Board Certified in Internal Medicine, Pulmonary Medicine, [place], and Sleep Medicine.  [facility] Office Number: [phone]  Patricia Pesa, M.D.   Merton Border, M.D  12/26/2016

## 2016-12-26 NOTE — Telephone Encounter (Signed)
Returned call to patient and explained that this is the 1st available appt with Dr. Mortimer Fries unless we have a cancellation.  I offered possible earlier appointment with another provider. Patient wants to see Mortimer Fries as that is who Dr. Genevive Bi referred to . I explained bronchoscopy is performed by all 3 of our pulmonologist and [facility] may not be the one performing although ordering.Patient was not satisfied, she was going to call Dr. Genevive Bi office.

## 2016-12-27 ENCOUNTER — Ambulatory Visit (INDEPENDENT_AMBULATORY_CARE_PROVIDER_SITE_OTHER): Payer: Medicare Other | Admitting: Internal Medicine

## 2016-12-27 ENCOUNTER — Encounter: Payer: Self-pay | Admitting: Internal Medicine

## 2016-12-27 VITALS — BP 104/70 | HR 72 | Resp 16 | Ht 64.0 in | Wt 130.0 lb

## 2016-12-27 DIAGNOSIS — C342 Malignant neoplasm of middle lobe, bronchus or lung: Secondary | ICD-10-CM | POA: Diagnosis not present

## 2016-12-27 NOTE — Patient Instructions (Addendum)
--  Will scheduled EBUS bronchoscopy.  --Follow up with Dr. Genevive Bi after bronchoscopy.

## 2016-12-29 ENCOUNTER — Encounter: Payer: Self-pay | Admitting: *Deleted

## 2016-12-29 ENCOUNTER — Telehealth: Payer: Self-pay | Admitting: *Deleted

## 2016-12-29 ENCOUNTER — Telehealth: Payer: Self-pay

## 2016-12-29 NOTE — Telephone Encounter (Signed)
Spoke with Gabriel Cirri and EBUS is scheduled for 01/24/17 and they will notify patient.

## 2016-12-29 NOTE — Telephone Encounter (Signed)
Called patient to discuss pre-assessment appointment  01/16/17 at 9-1 phone call. Bronch is scheduled 01/24/17 at 1 pm. NPO after midnight. Patient must have a driver and the driver must remain on site during procedure. She will be given rest of information during pre assessment interview. Nothing further needed.

## 2017-01-16 ENCOUNTER — Encounter
Admission: RE | Admit: 2017-01-16 | Discharge: 2017-01-16 | Disposition: A | Discharge status: Home / Self Care | Payer: Medicare Other | Source: Ambulatory Visit | Attending: Internal Medicine | Admitting: Internal Medicine

## 2017-01-16 DIAGNOSIS — I341 Nonrheumatic mitral (valve) prolapse: Secondary | ICD-10-CM | POA: Insufficient documentation

## 2017-01-16 DIAGNOSIS — I10 Essential (primary) hypertension: Secondary | ICD-10-CM | POA: Insufficient documentation

## 2017-01-16 HISTORY — DX: Essential (primary) hypertension: I10

## 2017-01-16 HISTORY — DX: Dyspnea, unspecified: R06.00

## 2017-01-16 HISTORY — DX: Unspecified osteoarthritis, unspecified site: M19.90

## 2017-01-16 HISTORY — DX: Anemia, unspecified: D64.9

## 2017-01-16 NOTE — Patient Instructions (Signed)
  Your procedure is scheduled on: 01-24-17 TUESDAY Report to Same Day Surgery 2nd floor medical mall [facility] Entrance-take elevator on left to 2nd floor.  Check in with surgery information desk.) To find out your arrival time please call [phone] between 1PM - 3PM on 01-23-17 MONDAY  Remember: Instructions that are not followed completely may result in serious medical risk, up to and including death, or upon the discretion of your surgeon and anesthesiologist your surgery may need to be rescheduled.    _x___ 1. Do not eat food or drink liquids after midnight. No gum chewing or hard candies.     __x__ 2. No Alcohol for 24 hours before or after surgery.   __x__3. No Smoking for 24 prior to surgery.   ____  4. Bring all medications with you on the day of surgery if instructed.    __x__ 5. Notify your doctor if there is any change in your medical condition     (cold, fever, infections).     Do not wear jewelry, make-up, hairpins, clips or nail polish.  Do not wear lotions, powders, or perfumes. You may wear deodorant.  Do not shave 48 hours prior to surgery. Men may shave face and neck.  Do not bring valuables to the hospital.    [facility] is not responsible for any belongings or valuables.               Contacts, dentures or bridgework may not be worn into surgery.  Leave your suitcase in the car. After surgery it may be brought to your room.  For patients admitted to the hospital, discharge time is determined by your treatment team.   Patients discharged the day of surgery will not be allowed to drive home.  You will need someone to drive you home and stay with you the night of your procedure.    Please read over the following fact sheets that you were given:   [facility] Preparing for Surgery and or MRSA Information   _x___ TAKE THE FOLLOWING MEDICATION THE MORNING OF SURGERY WITH A SMALL SIP OF WATER. These include:  1. PREVACID  2. TAKE AN EXTRA PREVACID THE NIGHT  BEFORE SURGERY (MONDAY NIGHT)  3.  4.  5.  6.  ____Fleets enema or Magnesium Citrate as directed.   ____ Use CHG Soap or sage wipes as directed on instruction sheet   _X___ Use inhalers on the day of surgery and bring to hospital day of surgery-USE ALBUTEROL INHALER AT [place]  ____ Stop Metformin and Janumet 2 days prior to surgery.    ____ Take 1/2 of usual insulin dose the night before surgery and none on the morning     surgery.   ____ Follow recommendations from Cardiologist, Pulmonologist or PCP regarding stopping Aspirin, Coumadin, Pllavix ,Eliquis, Effient, or Pradaxa, and Pletal.  X____Stop Anti-inflammatories such as Advil, Aleve, IBUPROFEN, Motrin, Naproxen, Naprosyn, Goodies powders or aspirin products NOW-OK to take Tylenol    _x___ Stop supplements until after surgery-STOP BIOTIN (HAIR, SKIN AND NAILS) NOW-MAY RESUME AFTER SURGERY   ____ Bring C-Pap to the hospital.

## 2017-01-17 ENCOUNTER — Encounter
Admission: RE | Admit: 2017-01-17 | Discharge: 2017-01-17 | Disposition: A | Discharge status: Home / Self Care | Payer: Medicare Other | Source: Ambulatory Visit | Attending: Internal Medicine | Admitting: Internal Medicine

## 2017-01-17 DIAGNOSIS — I341 Nonrheumatic mitral (valve) prolapse: Secondary | ICD-10-CM | POA: Diagnosis not present

## 2017-01-17 DIAGNOSIS — I10 Essential (primary) hypertension: Secondary | ICD-10-CM | POA: Diagnosis not present

## 2017-01-24 ENCOUNTER — Encounter: Payer: Self-pay | Admitting: *Deleted

## 2017-01-24 ENCOUNTER — Ambulatory Visit: Payer: Medicare Other | Admitting: Certified Registered Nurse Anesthetist

## 2017-01-24 ENCOUNTER — Ambulatory Visit
Admission: RE | Admit: 2017-01-24 | Discharge: 2017-01-24 | Disposition: A | Discharge status: Home / Self Care | Payer: Medicare Other | Source: Ambulatory Visit | Attending: Internal Medicine | Admitting: Internal Medicine

## 2017-01-24 ENCOUNTER — Encounter
Admission: RE | Disposition: A | Discharge status: Home / Self Care | Payer: Self-pay | Source: Ambulatory Visit | Attending: Internal Medicine

## 2017-01-24 DIAGNOSIS — R590 Localized enlarged lymph nodes: Secondary | ICD-10-CM

## 2017-01-24 DIAGNOSIS — Z902 Acquired absence of lung [part of]: Secondary | ICD-10-CM | POA: Insufficient documentation

## 2017-01-24 DIAGNOSIS — R591 Generalized enlarged lymph nodes: Secondary | ICD-10-CM | POA: Insufficient documentation

## 2017-01-24 DIAGNOSIS — Z85118 Personal history of other malignant neoplasm of bronchus and lung: Secondary | ICD-10-CM | POA: Diagnosis not present

## 2017-01-24 DIAGNOSIS — J45909 Unspecified asthma, uncomplicated: Secondary | ICD-10-CM | POA: Insufficient documentation

## 2017-01-24 DIAGNOSIS — J449 Chronic obstructive pulmonary disease, unspecified: Secondary | ICD-10-CM | POA: Diagnosis not present

## 2017-01-24 DIAGNOSIS — I341 Nonrheumatic mitral (valve) prolapse: Secondary | ICD-10-CM | POA: Insufficient documentation

## 2017-01-24 DIAGNOSIS — Z79890 Hormone replacement therapy (postmenopausal): Secondary | ICD-10-CM | POA: Diagnosis not present

## 2017-01-24 DIAGNOSIS — Z882 Allergy status to sulfonamides status: Secondary | ICD-10-CM | POA: Insufficient documentation

## 2017-01-24 DIAGNOSIS — K219 Gastro-esophageal reflux disease without esophagitis: Secondary | ICD-10-CM | POA: Diagnosis not present

## 2017-01-24 DIAGNOSIS — M1990 Unspecified osteoarthritis, unspecified site: Secondary | ICD-10-CM | POA: Insufficient documentation

## 2017-01-24 DIAGNOSIS — C3490 Malignant neoplasm of unspecified part of unspecified bronchus or lung: Secondary | ICD-10-CM | POA: Diagnosis present

## 2017-01-24 DIAGNOSIS — Z880 Allergy status to penicillin: Secondary | ICD-10-CM | POA: Diagnosis not present

## 2017-01-24 DIAGNOSIS — I10 Essential (primary) hypertension: Secondary | ICD-10-CM | POA: Insufficient documentation

## 2017-01-24 DIAGNOSIS — Z9221 Personal history of antineoplastic chemotherapy: Secondary | ICD-10-CM | POA: Insufficient documentation

## 2017-01-24 DIAGNOSIS — C342 Malignant neoplasm of middle lobe, bronchus or lung: Secondary | ICD-10-CM

## 2017-01-24 DIAGNOSIS — Z79899 Other long term (current) drug therapy: Secondary | ICD-10-CM | POA: Diagnosis not present

## 2017-01-24 DIAGNOSIS — Z791 Long term (current) use of non-steroidal anti-inflammatories (NSAID): Secondary | ICD-10-CM | POA: Diagnosis not present

## 2017-01-24 HISTORY — PX: VIDEO BRONCHOSCOPY WITH ENDOBRONCHIAL ULTRASOUND: SHX6177

## 2017-01-24 SURGERY — BRONCHOSCOPY, WITH EBUS
Anesthesia: General | Laterality: Right

## 2017-01-24 MED ORDER — PROPOFOL 10 MG/ML IV BOLUS
INTRAVENOUS | Status: AC
  Filled 2017-01-24: qty 20

## 2017-01-24 MED ORDER — LIDOCAINE HCL 2 % EX GEL: 1.0000 "application " | Freq: Once | CUTANEOUS | Status: DC

## 2017-01-24 MED ORDER — SUCCINYLCHOLINE CHLORIDE 20 MG/ML IJ SOLN
INTRAMUSCULAR | Status: AC
  Filled 2017-01-24: qty 1

## 2017-01-24 MED ORDER — DEXAMETHASONE SODIUM PHOSPHATE 10 MG/ML IJ SOLN
INTRAMUSCULAR | Status: AC
  Filled 2017-01-24: qty 1

## 2017-01-24 MED ORDER — ONDANSETRON HCL 4 MG/2ML IJ SOLN
INTRAMUSCULAR | Status: AC
  Filled 2017-01-24: qty 2

## 2017-01-24 MED ORDER — LIDOCAINE HCL 2 % IJ SOLN
INTRAMUSCULAR | Status: AC
  Filled 2017-01-24: qty 10

## 2017-01-24 MED ORDER — FENTANYL CITRATE (PF) 100 MCG/2ML IJ SOLN
INTRAMUSCULAR | Status: DC | PRN
  Administered 2017-01-24 (×2): 50 ug via INTRAVENOUS

## 2017-01-24 MED ORDER — ONDANSETRON HCL 4 MG/2ML IJ SOLN
INTRAMUSCULAR | Status: DC | PRN
  Administered 2017-01-24: 4 mg via INTRAVENOUS

## 2017-01-24 MED ORDER — FENTANYL CITRATE (PF) 100 MCG/2ML IJ SOLN
INTRAMUSCULAR | Status: AC
  Filled 2017-01-24: qty 2

## 2017-01-24 MED ORDER — ROCURONIUM BROMIDE 50 MG/5ML IV SOLN
INTRAVENOUS | Status: AC
  Filled 2017-01-24: qty 1

## 2017-01-24 MED ORDER — PHENYLEPHRINE HCL 10 MG/ML IJ SOLN
INTRAMUSCULAR | Status: DC | PRN
  Administered 2017-01-24: 100 ug via INTRAVENOUS

## 2017-01-24 MED ORDER — PHENYLEPHRINE HCL 0.25 % NA SOLN
1.0000 | Freq: Four times a day (QID) | NASAL | Status: DC | PRN
  Filled 2017-01-24: qty 15

## 2017-01-24 MED ORDER — MIDAZOLAM HCL 2 MG/2ML IJ SOLN
INTRAMUSCULAR | Status: DC | PRN
  Administered 2017-01-24: 1 mg via INTRAVENOUS

## 2017-01-24 MED ORDER — SUGAMMADEX SODIUM 200 MG/2ML IV SOLN
INTRAVENOUS | Status: AC
  Filled 2017-01-24: qty 2

## 2017-01-24 MED ORDER — LIDOCAINE HCL (CARDIAC) 20 MG/ML IV SOLN
INTRAVENOUS | Status: DC | PRN
  Administered 2017-01-24: 80 mg via INTRAVENOUS

## 2017-01-24 MED ORDER — BUTAMBEN-TETRACAINE-BENZOCAINE 2-2-14 % EX AERO
1.0000 | INHALATION_SPRAY | Freq: Once | CUTANEOUS | Status: DC
  Filled 2017-01-24: qty 20

## 2017-01-24 MED ORDER — ROCURONIUM BROMIDE 100 MG/10ML IV SOLN
INTRAVENOUS | Status: DC | PRN
  Administered 2017-01-24: 10 mg via INTRAVENOUS
  Administered 2017-01-24: 30 mg via INTRAVENOUS
  Administered 2017-01-24: 10 mg via INTRAVENOUS

## 2017-01-24 MED ORDER — FENTANYL CITRATE (PF) 100 MCG/2ML IJ SOLN: 25.0000 ug | INTRAMUSCULAR | Status: DC | PRN

## 2017-01-24 MED ORDER — GLYCOPYRROLATE 0.2 MG/ML IJ SOLN
INTRAMUSCULAR | Status: DC | PRN
  Administered 2017-01-24: 0.2 mg via INTRAVENOUS

## 2017-01-24 MED ORDER — DEXAMETHASONE SODIUM PHOSPHATE 10 MG/ML IJ SOLN
INTRAMUSCULAR | Status: DC | PRN
  Administered 2017-01-24: 10 mg via INTRAVENOUS

## 2017-01-24 MED ORDER — LACTATED RINGERS IV SOLN
INTRAVENOUS | Status: DC
  Administered 2017-01-24: 13:00:00 via INTRAVENOUS

## 2017-01-24 MED ORDER — PROPOFOL 10 MG/ML IV BOLUS
INTRAVENOUS | Status: DC | PRN
  Administered 2017-01-24: 150 mg via INTRAVENOUS
  Administered 2017-01-24 (×2): 20 mg via INTRAVENOUS

## 2017-01-24 MED ORDER — MIDAZOLAM HCL 2 MG/2ML IJ SOLN
INTRAMUSCULAR | Status: AC
Start: 2017-01-24 — End: ?
  Filled 2017-01-24: qty 2

## 2017-01-24 MED ORDER — PROMETHAZINE HCL 25 MG/ML IJ SOLN: 6.2500 mg | INTRAMUSCULAR | Status: DC | PRN

## 2017-01-24 NOTE — Anesthesia Procedure Notes (Signed)
Procedure Name: Intubation Date/Time: 01/24/2017 1:15 PM Performed by: Johnna Acosta Pre-anesthesia Checklist: Patient identified, Timeout performed, Emergency Drugs available, Suction available and Patient being monitored Patient Re-evaluated:Patient Re-evaluated prior to induction Oxygen Delivery Method: Circle system utilized Preoxygenation: Pre-oxygenation with 100% oxygen Induction Type: IV induction Ventilation: Mask ventilation without difficulty Laryngoscope Size: Miller and 2 Grade View: Grade I Tube type: Oral Tube size: 8.5 mm Number of attempts: 1 Airway Equipment and Method: Stylet Placement Confirmation: ETT inserted through vocal cords under direct vision,  positive ETCO2 and breath sounds checked- equal and bilateral Secured at: 22 cm Tube secured with: Tape Dental Injury: Teeth and Oropharynx as per pre-operative assessment

## 2017-01-24 NOTE — Anesthesia Post-op Follow-up Note (Cosign Needed)
Anesthesia QCDR form completed.        

## 2017-01-24 NOTE — Transfer of Care (Signed)
Immediate Anesthesia Transfer of Care Note  Patient: Lauren Page  Procedure(s) Performed: Procedure(s): VIDEO BRONCHOSCOPY WITH ENDOBRONCHIAL ULTRASOUND (Right)  Patient Location: PACU  Anesthesia Type:General  Level of Consciousness: sedated  Airway & Oxygen Therapy: Patient Spontanous Breathing and Patient connected to face mask oxygen  Post-op Assessment: Report given to RN and Post -op Vital signs reviewed and stable  Post vital signs: Reviewed and stable  Last Vitals:  Vitals:   01/24/17 1223  BP: 134/72  Pulse: 66  Resp: 16  Temp: (!) 36.3 C    Last Pain:  Vitals:   01/24/17 1223  TempSrc: Oral      Patients Stated Pain Goal: 0 (97/98/92 1194)  Complications: No apparent anesthesia complications

## 2017-01-24 NOTE — Discharge Instructions (Signed)
In the first 24 hours after the procedure you may have fevers, chills, you may have some mild central chest pain, and may cough up some blood. This is normal in the first 24 hours. You may take tylenol (acetaminophen) for fever, mild chest pain or chills. If the fever or the coughing up blood is not improving in 24 hours, or if they are severe, please call our office as you may need an antibiotic.  If you develop sudden chest pain or severe breathing difficulty, go to the ER or call 911.

## 2017-01-24 NOTE — H&P (View-Only) (Signed)
[facility] Consultation    Update: Pt seen and evaluated before pt going for procedure, new changes in exam have been noted.     Assessment and Plan:  Lung cancer. -Explained results of PET scan, explained that the peripheral lung nodule is not accessible via ENB bronchoscopy. Interventional radiology has reviewed images, felt that the right lung nodule was very low risk for cancer and thus would not recommend biopsy at this time. -Slightly enlarged hilar and paratracheal lymph nodes,explained that these were PET negative. Also, however, patient remains very concerned about the possibility/likelihood of recurrent cancer. -Explained to patient that the likelihood of the cancer is fairly low here, however, I would be happy to perform this procedure to help assuage their fears and rule out recurrent cancer. Explained  Major risks of the procedure including infection, bleeding, lung collapse, need for prolonged mechanical ventilatory support.  Hilar lymphadenopathy. -as above, hilar and paratracheal lymphadenopathy. We'll schedule EBUS bronchoscopy.  COPD/emphysema. -Continue current inhaled therapy.     Date: 12/26/2016  MRN# [id number] Lauren Page 02-08-1951    Lauren Page is a 66 y.o. old female seen in consultation for chief complaint of:    Chief Complaint  Patient presents with  . Advice Only    Referred By Dr. Genevive Bi  . Lung Mass    HPI:   This patient is a 66 year old woman who is status post right middle lobectomy several years ago. 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 A  subsequent PET scan was performed which revealed no evidence of uptake in the hypermetabolic range. A repeat CT scan compared this to the prior scan 3 months ago. There are some bilateral hilar adenopathy which is increased slightly in size. The largest lymph node now measures over 1.5 cm in the right hilum. The left hilum has a 9 mm lymph node. These have increased from before. The other nodules in the lungs are essentially unchanged. The patient denies any new symptoms.   She is here with her husband. She feels that the PET scan was a "wasted" test as it did not show anything, reviewed this with her, and explained the negative results. However she remains very concerned that the CT showed changes in the nodule/lymph node size. I explained that there is some degree of error in these tests, however she maintains that she is extremely concerned about cancer recurrence.   I personally reviewed, images, CT chest 12/16/16, mild subcarinal and paratracheal lymphadenopathy, 1.5 cm right hilar node. This appears to be slightly larger than previous CT scan performed on 09/06/16. PET scan performed on 3/13 was negative, including the nodular area seen in the superior segment of the right lower lobe.   PMHX:   Past Medical History:  Diagnosis Date  . Acid reflux   . Asthma   . Cancer of right lung ([place]) 2015   lung cancer - chemo, Dr Genevive Bi removed RML Lobectomy.   . Mitral valve prolapse    Surgical Hx:  Past Surgical History:  Procedure Laterality Date  . ABDOMINAL HYSTERECTOMY    .  BREAST BIOPSY Left 1994 (-), 2004 (-)  . COLONOSCOPY  2008    Dr. Donnella Sham  . KNEE SURGERY  2007  . UPPER GI ENDOSCOPY  2008   Family Hx:  Family History  Problem Relation Age of Onset  . Breast cancer Maternal Aunt   . Breast cancer Paternal Aunt   . Breast cancer Maternal Grandmother   . Breast cancer Maternal Aunt   . Breast cancer Paternal Aunt   . Healthy Mother   . Healthy Father    Social Hx:   Social  History  Substance Use Topics  . Smoking status: Never Smoker  . Smokeless tobacco: Never Used  . Alcohol use Yes     Comment: 1 Glass Wine / Night   Medication:    Current Outpatient Prescriptions:  .  albuterol (PROAIR HFA) 108 (90 BASE) MCG/ACT inhaler, INHALE 2 PUFFS AS DIRECTED AS NEEDED, Disp: , Rfl:  .  B Complex Vitamins (VITAMIN B COMPLEX PO), Take by mouth., Disp: , Rfl:  .  Biotin 800 MCG TABS, Take 1 tablet by mouth daily. , Disp: , Rfl:  .  cephALEXin (KEFLEX) 500 MG capsule, Take 1 capsule (500 mg total) by mouth 3 (three) times daily., Disp: 15 capsule, Rfl: 0 .  Cholecalciferol (D3 MAXIMUM STRENGTH) 5000 units capsule, Take 5,000 Units by mouth daily., Disp: , Rfl:  .  estrogens, conjugated, (PREMARIN) 1.25 MG tablet, Take 1 tablet by mouth daily., Disp: , Rfl:  .  fexofenadine (ALLEGRA) 180 MG tablet, Take 180 mg by mouth as needed for allergies. , Disp: , Rfl:  .  fluticasone (FLONASE) 50 MCG/ACT nasal spray, USE 2 SPRAYS IN EACH NOSTRIL AT BEDTIME, Disp: , Rfl:  .  Fluticasone-Salmeterol (ADVAIR DISKUS) 250-50 MCG/DOSE AEPB, INHALE ONE PUFF INTO THE LUNGS 2 TIMES DAILY., Disp: , Rfl:  .  lansoprazole (PREVACID) 30 MG capsule, Take 30 mg by mouth daily., Disp: , Rfl:  .  losartan (COZAAR) 25 MG tablet, Take 1 tablet by mouth at bedtime., Disp: , Rfl:  .  meloxicam (MOBIC) 15 MG tablet, Take 1 tablet by mouth daily., Disp: , Rfl:  .  Vitamin D, Ergocalciferol, (DRISDOL) 50000 units CAPS capsule, , Disp: , Rfl: 11   Allergies:  Penicillins and Sulfa antibiotics  Review of Systems: Gen:  Denies  fever, sweats, chills HEENT: Denies blurred vision, double vision. bleeds, sore throat Cvc:  No dizziness, chest pain. Resp:   Denies cough or sputum production, shortness of breath Gi: Denies swallowing difficulty, stomach pain. Gu:  Denies bladder incontinence, burning urine Ext:   No Joint pain, stiffness. Skin: No skin rash,  hives  Endoc:  No polyuria,  polydipsia. Psych: No depression, insomnia. Other:  All other systems were reviewed with the patient and were negative other that what is mentioned in the HPI.   Physical Examination:   VS: BP 104/70 (BP Location: Left Arm, Cuff Size: Normal)   Pulse 72   Resp 16   Ht 5\' 4"  (1.626 m)   Wt 130 lb (59 kg)   SpO2 96%   BMI 22.31 kg/m   General Appearance: No distress  Neuro:without focal findings,  speech normal,  HEENT: PERRLA, EOM intact.   Pulmonary: normal breath sounds, No wheezing.  CardiovascularNormal S1,S2.  No m/r/g.   Abdomen: Benign, Soft, non-tender. Renal:  No costovertebral tenderness  GU:  No performed at this time. Endoc: No evident thyromegaly, no signs of acromegaly. Skin:   warm, no rashes, no ecchymosis  Extremities: normal, no cyanosis, clubbing.  Other findings:    LABORATORY PANEL:   CBC No results for input(s): WBC, HGB, HCT, PLT in the last 168 hours. ------------------------------------------------------------------------------------------------------------------  Chemistries  No results for input(s): NA, K, CL, CO2, GLUCOSE, BUN, CREATININE, CALCIUM, MG, AST, ALT, ALKPHOS, BILITOT in the last 168 hours.  Invalid input(s): GFRCGP ------------------------------------------------------------------------------------------------------------------  Cardiac Enzymes No results for input(s): TROPONINI in the last 168 hours. ------------------------------------------------------------  RADIOLOGY:  No results found.     Thank  you for the consultation and for allowing [facility] to assist in the care of your patient. Our recommendations are noted above.  Please contact us if we can be of further service.   Marda Stalker, MD.  Board Certified in Internal Medicine, Pulmonary Medicine, [place], and Sleep Medicine.  [facility] Office Number: [phone]  Patricia Pesa, M.D.   Merton Border, M.D  12/26/2016

## 2017-01-24 NOTE — Interval H&P Note (Signed)
History and Physical Interval Note:  01/24/2017 12:55 PM  Lauren Page  has presented today for surgery, with the diagnosis of lung cancer  The various methods of treatment have been discussed with the patient and family. After consideration of risks, benefits and other options for treatment, the patient has consented to  Procedure(s): [place] (Right) as a surgical intervention .  The patient's history has been reviewed, patient examined, no change in status, stable for surgery.  I have reviewed the patient's chart and labs.  Questions were answered to the patient's satisfaction.     [name]

## 2017-01-24 NOTE — Procedures (Signed)
  [facility]            Bronchoscopy Note   FINDINGS/SUMMARY:   -EBUS bronchoscopy performed with lymph node sampling of the right paratracheal, subcarinal node stations, with good returns. -Washing performed at right middle lobe sent for micro-, cytology.  Indication: History of cancer, with persistent lymphadenopathy. The patient (or their representative) was informed of the risks (including but not limited to bleeding, infection, respiratory failure, lung injury, tooth/oral injury) and benefits of the procedure and gave consent, see chart.   Pre-op diagnosis: Mediastinal lymphadenopathy. Post-op diagnosis: Same Estimated blood loss: Minimal  Medications for procedure: Please see anesthesia note.  Procedure description: After obtaining informed consent, a timeout was called to confirm the patient and the procedure. The patient was intubated by anesthesia services, please see their note for further details. The EBUS bronchoscope was passed via the endotracheal tube, to the subcarinal node. Several passes were taken with good returns. Bronchial scope was then taken to the right paratracheal node, again several passes were taken with good returns. Scope was then taken to the right hilar area, however, the good lymph node station could not be identified, there was some mild adenopathy but was very adjacent to a vessel, therefore, this was not biopsied. Cytopathologist noted some atypical cells seen in the right paratracheal node sample, therefore, the bronchoscope was returned here. Repeat sampling was taken to ensure adequate cellblock. The EBUS bronchoscope was then removed, the regular bronchoscope was passed, and anatomical was undertaken, all segments were enteredno anatomical abnormalities were noted. The bronchoscope was then taken to the right middle lobe, washings were taken 2 to be sent for microbiology and pathology.    Condition post procedure:  stable   Complications: none noted  Patient instructions: discussed with husband.the patient has a follow-up appointment with Dr. Genevive Bi in 3 days, where they would receive the results from this procedure.   Marda Stalker, MD.  Board Certified in Internal Medicine, Pulmonary Medicine, [place], and Sleep Medicine.  [facility] Office Number: [phone]  Patricia Pesa, M.D.  Vilinda Boehringer, M.D.  Cheral Marker, M.D  01/24/2017

## 2017-01-24 NOTE — Anesthesia Preprocedure Evaluation (Signed)
Anesthesia Evaluation  Patient identified by MRN, date of birth, ID band Patient awake    Reviewed: Allergy & Precautions, H&P , NPO status , Patient's Chart, lab work & pertinent test results, reviewed documented beta blocker date and time   History of Anesthesia Complications Negative for: history of anesthetic complications  Airway Mallampati: I  TM Distance: >3 FB Neck ROM: full    Dental  (+) Caps, Dental Advidsory Given, Missing   Pulmonary neg shortness of breath, asthma , neg sleep apnea, neg COPD, neg recent URI,           Cardiovascular Exercise Tolerance: Good hypertension, (-) angina(-) CAD, (-) Past MI, (-) Cardiac Stents and (-) CABG (-) dysrhythmias + Valvular Problems/Murmurs MVP      Neuro/Psych negative neurological ROS  negative psych ROS   GI/Hepatic Neg liver ROS, GERD  ,  Endo/Other  negative endocrine ROS  Renal/GU negative Renal ROS  negative genitourinary   Musculoskeletal   Abdominal   Peds  Hematology negative hematology ROS (+)   Anesthesia Other Findings Past Medical History: No date: Acid reflux No date: Anemia     Comment:  HAD TO HAVE IRON INFUSIONS BACK IN 2015 No date: Arthritis     Comment:  bil hands and back No date: Asthma 2015: Cancer of right lung (HCC)     Comment:  lung cancer - chemo, Dr Genevive Bi removed RML Lobectomy.  No date: Dyspnea     Comment:  WITH EXERTION No date: Hypertension No date: Mitral valve prolapse   Reproductive/Obstetrics negative OB ROS                             Anesthesia Physical Anesthesia Plan  ASA: II  Anesthesia Plan: General   Post-op Pain Management:    Induction: Intravenous  PONV Risk Score and Plan: 3 and Ondansetron, Dexamethasone, Propofol and Midazolam  Airway Management Planned: Oral ETT  Additional Equipment:   Intra-op Plan:   Post-operative Plan: Extubation in OR  Informed Consent: I  have reviewed the patients History and Physical, chart, labs and discussed the procedure including the risks, benefits and alternatives for the proposed anesthesia with the patient or authorized representative who has indicated his/her understanding and acceptance.   Dental Advisory Given  Plan Discussed with: Anesthesiologist, CRNA and Surgeon  Anesthesia Plan Comments:         Anesthesia Quick Evaluation

## 2017-01-25 ENCOUNTER — Encounter: Payer: Self-pay | Admitting: Internal Medicine

## 2017-01-25 NOTE — Anesthesia Postprocedure Evaluation (Signed)
Anesthesia Post Note  Patient: Lauren Page  Procedure(s) Performed: Procedure(s) (LRB): VIDEO BRONCHOSCOPY WITH ENDOBRONCHIAL ULTRASOUND (Right)  Patient location during evaluation: PACU Anesthesia Type: General Level of consciousness: awake and alert Pain management: pain level controlled Vital Signs Assessment: post-procedure vital signs reviewed and stable Respiratory status: spontaneous breathing, nonlabored ventilation, respiratory function stable and patient connected to nasal cannula oxygen Cardiovascular status: blood pressure returned to baseline and stable Postop Assessment: no signs of nausea or vomiting Anesthetic complications: no     Last Vitals:  Vitals:   01/24/17 1509 01/24/17 1518  BP: 135/60 123/70  Pulse: 64 (!) 59  Resp: 16 16  Temp:  36.6 C    Last Pain:  Vitals:   01/24/17 1518  TempSrc:   PainSc: 2                  Martha Clan

## 2017-01-26 ENCOUNTER — Telehealth: Payer: Self-pay | Admitting: *Deleted

## 2017-01-26 ENCOUNTER — Ambulatory Visit: Payer: Medicare Other | Admitting: Internal Medicine

## 2017-01-26 ENCOUNTER — Other Ambulatory Visit: Payer: Self-pay | Admitting: Internal Medicine

## 2017-01-26 DIAGNOSIS — C7801 Secondary malignant neoplasm of right lung: Secondary | ICD-10-CM

## 2017-01-26 LAB — CYTOLOGY - NON PAP

## 2017-01-26 LAB — CULTURE, BAL-QUANTITATIVE W GRAM STAIN: Culture: 5000 — AB

## 2017-01-26 LAB — CULTURE, BAL-QUANTITATIVE

## 2017-01-26 NOTE — Progress Notes (Signed)
referrral entered per Dr. Mortimer Fries direction and call report.

## 2017-01-26 NOTE — Telephone Encounter (Signed)
Pathologist call report:  EBUS Rt paratracheal lymph node. 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

## 2017-01-27 ENCOUNTER — Ambulatory Visit: Payer: Medicare Other | Admitting: Cardiothoracic Surgery

## 2017-01-27 ENCOUNTER — Telehealth: Payer: Self-pay

## 2017-01-27 ENCOUNTER — Ambulatory Visit (INDEPENDENT_AMBULATORY_CARE_PROVIDER_SITE_OTHER): Payer: Medicare Other | Admitting: Cardiothoracic Surgery

## 2017-01-27 ENCOUNTER — Encounter: Payer: Self-pay | Admitting: Cardiothoracic Surgery

## 2017-01-27 VITALS — BP 124/85 | HR 66 | Temp 98.6°F | Resp 18 | Ht 64.0 in | Wt 131.0 lb

## 2017-01-27 DIAGNOSIS — R918 Other nonspecific abnormal finding of lung field: Secondary | ICD-10-CM | POA: Diagnosis not present

## 2017-01-27 NOTE — Patient Instructions (Addendum)
We have placed a referral today to Medical Oncology in regards to your Lung Cancer. We will call you with an appointment as soon as it has been made. This process typically takes 5-7 business days. If you do not hear from our office after that time period, please give our office a call so that we may check on the status for you.

## 2017-01-27 NOTE — Progress Notes (Signed)
  Patient ID: Lauren Page, female   DOB: 01-26-1951, 66 y.o.   MRN: [id number]  HISTORY: She comes in today having tolerated her bronchoscopy very well. She states she still recovering from her anesthetic but his had no cough or fevers. She's had no hemoptysis.   Vitals:   01/27/17 1038  BP: 124/85  Pulse: 66  Resp: 18  Temp: 98.6 F (37 C)     EXAM:    Resp: Lungs are clear bilaterally.  No respiratory distress, normal effort. Heart:  Regular without murmurs Abd:  Abdomen is soft, non distended and non tender. No masses are palpable.  There is no rebound and no guarding.  Neurological: Alert and oriented to person, place, and time. Coordination normal.  Skin: Skin is warm and dry. No rash noted. No diaphoretic. No erythema. No pallor.  Psychiatric: Normal mood and affect. Normal behavior. Judgment and thought content normal.    ASSESSMENT: I have independently reviewed her results. The right paratracheal node was positive for metastatic adenocarcinoma.   PLAN:   I did discuss with her the results of the biopsies. She understands that this recurrent adenocarcinoma the lung is likely to be treated with chemotherapy. I reviewed with her the need for continued follow-up with our oncologist and I made an appointment for her to see Dr. Rogue Bussing. She is agreeable to doing that. I did not make a return appointment for her at this time. She states that if she gets chemotherapy she may need a Port-A-Cath and would be happy to do that for her.    Nestor Lewandowsky, MD

## 2017-01-27 NOTE — Telephone Encounter (Signed)
Spoke with Diane at Cancer center and appointment is made for 01/30/17 @ 1:30 arrival with Dr.Brahmanday.   Patient notified of the above appointment.

## 2017-01-28 LAB — MTB NAA WITHOUT AFB CULTURE: M Tuberculosis, Naa: NEGATIVE

## 2017-01-29 NOTE — Telephone Encounter (Signed)
Patient has received results from Dr. Genevive Bi and has been referred back to Oncology for further treatment.

## 2017-01-30 ENCOUNTER — Inpatient Hospital Stay: Payer: Medicare Other | Attending: Internal Medicine | Admitting: Internal Medicine

## 2017-01-30 VITALS — BP 124/76 | HR 66 | Temp 97.4°F | Resp 16 | Wt 130.6 lb

## 2017-01-30 DIAGNOSIS — I700 Atherosclerosis of aorta: Secondary | ICD-10-CM | POA: Diagnosis not present

## 2017-01-30 DIAGNOSIS — I10 Essential (primary) hypertension: Secondary | ICD-10-CM | POA: Insufficient documentation

## 2017-01-30 DIAGNOSIS — I341 Nonrheumatic mitral (valve) prolapse: Secondary | ICD-10-CM | POA: Diagnosis not present

## 2017-01-30 DIAGNOSIS — Z79899 Other long term (current) drug therapy: Secondary | ICD-10-CM | POA: Insufficient documentation

## 2017-01-30 DIAGNOSIS — K219 Gastro-esophageal reflux disease without esophagitis: Secondary | ICD-10-CM | POA: Diagnosis not present

## 2017-01-30 DIAGNOSIS — J45909 Unspecified asthma, uncomplicated: Secondary | ICD-10-CM | POA: Insufficient documentation

## 2017-01-30 DIAGNOSIS — C342 Malignant neoplasm of middle lobe, bronchus or lung: Secondary | ICD-10-CM | POA: Insufficient documentation

## 2017-01-30 NOTE — Progress Notes (Signed)
Lauren Page OFFICE PROGRESS NOTE  Patient Care Team: Lauren Crouch, MD as PCP - General (Internal Medicine)  Cancer Staging No matching staging information was found for the patient.   Oncology History   # July-AUG 2018- RECURRENT Adeno Ca [EBUS; Right paratrach LN [Dr.Ram & Dr.Oaks]insuff tissue for testing; check liquid Bx]  ------------------------------------------------------------------------------   # MAY 2015- .Marland KitchenA right middle lobe adenocarcinoma s/p resection [Dr.Oaks].  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  may of 2015 4.Patient has finished 4 cycles of chemotherapy on January 23, 2014 Now patient would be randomized   to  our [facility] protocol for Tarceva vs. placebo because of EGFR mutation 3.patient desires to get off protocol because of progressive side effect even after reducing dose.  (May 13, 2014) 5.Patient is starting  maintenance investigationally approach with Tarceva vs. placebo September of 2015. 6.Patient came off protocol therapy because of significant GI side effect May 13, 2014   Patient is nonsmoker;        Primary cancer of right middle lobe of lung ([place])     INTERVAL HISTORY:  Lauren Page 66 y.o.  female with above history of stage III EGFR mutated lung cancer [2015]- Is here to review the recent workup for her recurrent malignancy/plan of care.  Patient had a follow-up CT scan in June 2018 that showed bilateral increasing hilar adenopathy- which led to EBUS/biopsy- that showed adenocarcinoma.   She has mild shortness of breath with exertion. Otherwise no significant cough. Denies any weight loss. Denies any headaches. No chest pain. She denies any headaches.  REVIEW OF SYSTEMS:  A complete 10 point review of system is done which is negative except mentioned above/history of present illness.   PAST MEDICAL  HISTORY :  Past Medical History:  Diagnosis Date  . Acid reflux   . Anemia    HAD TO HAVE IRON INFUSIONS BACK IN 2015  . Arthritis    bil hands and back  . Asthma   . Cancer of right lung ([place]) 2015   lung cancer - chemo, Dr Lauren Page removed RML Lobectomy.   Marland Kitchen Dyspnea    WITH EXERTION  . Hypertension   . Mitral valve prolapse     PAST SURGICAL HISTORY :   Past Surgical History:  Procedure Laterality Date  . ABDOMINAL HYSTERECTOMY    . BREAST BIOPSY Left 1994 (-), 2004 (-)  . COLONOSCOPY  2008    Dr. Donnella Page  . KNEE SURGERY  2007  . LUNG LOBECTOMY Right    middle lobe  . SALIVARY GLAND SURGERY Left 1983   with lymph node removal also  . UPPER GI ENDOSCOPY  2008  . VIDEO BRONCHOSCOPY WITH ENDOBRONCHIAL ULTRASOUND Right 01/24/2017   Procedure: VIDEO BRONCHOSCOPY WITH ENDOBRONCHIAL ULTRASOUND;  Surgeon: Lauren Hobby, MD;  Location: [facility] ORS;  Service: Pulmonary;  Laterality: Right;    FAMILY HISTORY :   Family History  Problem Relation Age of Onset  . Breast cancer Maternal Aunt   . Breast cancer Paternal Aunt   . Breast cancer Maternal Grandmother   . Breast cancer Maternal Aunt   . Breast cancer Paternal Aunt   . Healthy Mother   . Healthy Father     SOCIAL HISTORY:   Social History  Substance Use Topics  . Smoking status: Never Smoker  . Smokeless tobacco: Never Used  . Alcohol use 4.2 oz/week  7 Glasses of wine per week    ALLERGIES:  is allergic to penicillins and sulfa antibiotics.  MEDICATIONS:  Current Outpatient Prescriptions  Medication Sig Dispense Refill  . albuterol (PROVENTIL HFA;VENTOLIN HFA) 108 (90 Base) MCG/ACT inhaler Inhale 1-2 puffs into the lungs every 6 (six) hours as needed for wheezing or shortness of breath.    . B Complex Vitamins (VITAMIN B COMPLEX PO) Take 1 tablet by mouth daily.     . Biotin w/ Vitamins C & E (HAIR/SKIN/NAILS PO) Take 1 tablet by mouth daily.    . Calcium Carb-Cholecalciferol (CALCIUM 600+D) 600-800  MG-UNIT TABS Take 2 tablets by mouth daily.    . Cholecalciferol (D3 MAXIMUM STRENGTH) 5000 units capsule Take 5,000 Units by mouth daily.    . COLLAGEN PO Take 1,000 mg by mouth daily.    . cyclobenzaprine (FLEXERIL) 5 MG tablet Take 5 mg by mouth 3 (three) times daily as needed for muscle spasms.    . fexofenadine (ALLEGRA) 180 MG tablet Take 180 mg by mouth daily as needed for allergies.     . fluticasone (FLONASE) 50 MCG/ACT nasal spray Place 2 sprays into both nostrils daily as needed for allergies or rhinitis.    . Fluticasone-Salmeterol (ADVAIR) 250-50 MCG/DOSE AEPB Inhale 1 puff into the lungs 2 (two) times daily as needed (for respiratory issues.).    Marland Kitchen ibuprofen (ADVIL,MOTRIN) 800 MG tablet Take 800 mg by mouth every 8 (eight) hours as needed (for pain).    Marland Kitchen lansoprazole (PREVACID) 30 MG capsule Take 30 mg by mouth every morning.     Marland Kitchen losartan (COZAAR) 25 MG tablet Take 25 mg by mouth at bedtime.      No current facility-administered medications for this visit.     PHYSICAL EXAMINATION: ECOG PERFORMANCE STATUS: 0 - Asymptomatic  BP 124/76 (BP Location: Left Arm, Patient Position: Sitting)   Pulse 66   Temp (!) 97.4 F (36.3 C) (Tympanic)   Resp 16   Wt 130 lb 9.6 oz (59.2 kg)   SpO2 92%   BMI 22.42 kg/m   Filed Weights   01/30/17 1339  Weight: 130 lb 9.6 oz (59.2 kg)    GENERAL: Well-nourished well-developed; Alert, no distress and comfortable.  Alone.  EYES: no pallor or icterus OROPHARYNX: no thrush or ulceration; good dentition  NECK: supple, no masses felt LYMPH:  no palpable lymphadenopathy in the cervical, axillary or inguinal regions LUNGS: clear to auscultation and  No wheeze or crackles HEART/CVS: regular rate & rhythm and no murmurs; No lower extremity edema ABDOMEN:abdomen soft, non-tender and normal bowel sounds Musculoskeletal:no cyanosis of digits and no clubbing  PSYCH: alert & oriented x 3 with fluent speech NEURO: no focal motor/sensory  deficits SKIN:  no rashes or significant lesions  LABORATORY DATA:  I have reviewed the data as listed    Component Value Date/Time   NA 137 09/08/2016 1022   NA 141 06/03/2014 1039   K 3.7 09/08/2016 1022   K 4.1 06/03/2014 1039   CL 101 09/08/2016 1022   CL 105 06/03/2014 1039   CO2 28 09/08/2016 1022   CO2 28 06/03/2014 1039   GLUCOSE 72 09/08/2016 1022   GLUCOSE 88 06/03/2014 1039   BUN 15 09/08/2016 1022   BUN 14 06/03/2014 1039   CREATININE 0.70 12/16/2016 1045   CREATININE 0.68 06/03/2014 1039   CALCIUM 9.3 09/08/2016 1022   CALCIUM 8.7 06/03/2014 1039   PROT 7.3 09/08/2016 1022   PROT 7.6 06/03/2014 1039  ALBUMIN 4.0 09/08/2016 1022   ALBUMIN 3.7 06/03/2014 1039   AST 37 09/08/2016 1022   AST 36 06/03/2014 1039   ALT 31 09/08/2016 1022   ALT 34 06/03/2014 1039   ALKPHOS 65 09/08/2016 1022   ALKPHOS 86 06/03/2014 1039   BILITOT 0.7 09/08/2016 1022   BILITOT 0.4 06/03/2014 1039   GFRNONAA >60 09/08/2016 1022   GFRNONAA >60 06/03/2014 1039   GFRNONAA >60 03/20/2014 1109   GFRAA >60 09/08/2016 1022   GFRAA >60 06/03/2014 1039   GFRAA >60 03/20/2014 1109    No results found for: SPEP, UPEP  Lab Results  Component Value Date   WBC 3.0 (L) 09/08/2016   NEUTROABS 1.4 09/08/2016   HGB 14.0 09/08/2016   HCT 40.7 09/08/2016   MCV 90.3 09/08/2016   PLT 269 09/08/2016      Chemistry      Component Value Date/Time   NA 137 09/08/2016 1022   NA 141 06/03/2014 1039   K 3.7 09/08/2016 1022   K 4.1 06/03/2014 1039   CL 101 09/08/2016 1022   CL 105 06/03/2014 1039   CO2 28 09/08/2016 1022   CO2 28 06/03/2014 1039   BUN 15 09/08/2016 1022   BUN 14 06/03/2014 1039   CREATININE 0.70 12/16/2016 1045   CREATININE 0.68 06/03/2014 1039      Component Value Date/Time   CALCIUM 9.3 09/08/2016 1022   CALCIUM 8.7 06/03/2014 1039   ALKPHOS 65 09/08/2016 1022   ALKPHOS 86 06/03/2014 1039   AST 37 09/08/2016 1022   AST 36 06/03/2014 1039   ALT 31 09/08/2016 1022    ALT 34 06/03/2014 1039   BILITOT 0.7 09/08/2016 1022   BILITOT 0.4 06/03/2014 1039     IMPRESSION: 1. Interval increase in size of bilateral hilar lymph nodes. The right hilar node now measures 1.6 cm. 2. Stable index nodule within the superior segment of right lower lobe. Similar appearance of subpleural nodularity overlying the right lung and extending along the major fissure. 3.  Aortic Atherosclerosis (ICD10-I70.0).   Electronically Signed   By: Signa Kell M.D.   On: 12/16/2016 13:18  RADIOGRAPHIC STUDIES: I have personally reviewed the radiological images as listed and agreed with the findings in the report. No results found.   ASSESSMENT & PLAN:  Primary cancer of right middle lobe of lung (HCC) # Recurrent STAGE III lung ca [EGFR mutated; positive for adenocarcinoma of the right paratracheal lymph node]. CT scan June 6th 2018 ~13mm nodule in RUL/ stable; but increasing hilar/mediastinal LN; s/p EBUS of right paratrach LN- POSITIVE for Recurrence- Adeno Ca; tissue insuff. For additional testing. Recommend a PET scan for further evaluation.   # Long discussion with patient that surgery is not recommended- given N2 disease. If the PET scan shows absence of distant metastatic disease I recommend concurrent chemoradiation with carbo Taxol- followed by consolidation chemotherapy. Discussed with the patient that this could be cured only in small percentage of patients only. But, should have multiple treatment options including but not limited to osimertinib and others moving forward.   #  port placement. Hopefully the planned start chemotherapy next week. Antiemetics-Zofran and Compazine; EMLA cream sent to pharmacy.   Discussed the potential side effects including but not limited to-increasing fatigue, nausea vomiting, diarrhea, hair loss, sores in the mouth, increase risk of infection and also neuropathy.   # I reviewed the blood work- with the patient in detail; also  reviewed the imaging independently [as summarized above]; and  with the patient in detail.   # referral to Dr.Oaks/ Dr.Crystal. Follow-up with me in the week of August 13th to start treatment.  # 40 minutes face-to-face with the patient discussing the above plan of care; more than 50% of time spent on prognosis/ natural history; counseling and coordination.    Orders Placed This Encounter  Procedures  . NM PET Image Restag (PS) Skull Base To Thigh    Standing Status:   Future    Standing Expiration Date:   04/01/2018    Order Specific Question:   Reason for Exam (SYMPTOM  OR DIAGNOSIS REQUIRED)    Answer:   lung cancer    Order Specific Question:   Preferred imaging location?    Answer:   [facility]  . CBC with Differential    Standing Status:   Future    Standing Expiration Date:   01/30/2018  . Comprehensive metabolic panel    Standing Status:   Future    Standing Expiration Date:   01/30/2018  . Ambulatory referral to Cardiothoracic Surgery    Referral Priority:   Routine    Referral Type:   Surgical    Referral Reason:   Specialty Services Required    Referred to Provider:   Nestor Lewandowsky, MD    Requested Specialty:   Cardiothoracic Surgery    Number of Visits Requested:   1   All questions were answered. The patient knows to call the clinic with any problems, questions or concerns.      Cammie Sickle, MD 01/31/2017 8:01 AM

## 2017-01-30 NOTE — Assessment & Plan Note (Addendum)
#  Recurrent STAGE III lung ca [EGFR mutated; positive for adenocarcinoma of the right paratracheal lymph node]. CT scan June 6th 2018 ~22m nodule in RUL/ stable; but increasing hilar/mediastinal LN; s/p EBUS of right paratrach LN- POSITIVE for Recurrence- Adeno Ca; tissue insuff. For additional testing. Recommend a PET scan for further evaluation.   # Long discussion with patient that surgery is not recommended- given N2 disease. If the PET scan shows absence of distant metastatic disease I recommend concurrent chemoradiation with carbo Taxol- followed by consolidation chemotherapy. Discussed with the patient that this could be cured only in small percentage of patients only. But, should have multiple treatment options including but not limited to osimertinib and others moving forward.   #  port placement. Hopefully the planned start chemotherapy next week. Antiemetics-Zofran and Compazine; EMLA cream sent to pharmacy.   Discussed the potential side effects including but not limited to-increasing fatigue, nausea vomiting, diarrhea, hair loss, sores in the mouth, increase risk of infection and also neuropathy.   # I reviewed the blood work- with the patient in detail; also reviewed the imaging independently [as summarized above]; and with the patient in detail.   # referral to Dr.Oaks/ Dr.Crystal. Follow-up with me in the week of August 13th to start treatment.  # 40 minutes face-to-face with the patient discussing the above plan of care; more than 50% of time spent on prognosis/ natural history; counseling and coordination.

## 2017-01-30 NOTE — Progress Notes (Signed)
Patient is here today for a follow up. Patient reports no new issues today.

## 2017-01-31 ENCOUNTER — Telehealth: Payer: Self-pay

## 2017-01-31 NOTE — Telephone Encounter (Signed)
Called patient and had to leave her a voicemail to return my call since she needs to be seen by Dr. Genevive Bi this Friday (02/03/2017) for Port-Placement consult.

## 2017-01-31 NOTE — Telephone Encounter (Signed)
Patient called to confirm that she would be coming to her appointment with Dr. Genevive Bi on Friday morning.

## 2017-02-03 ENCOUNTER — Ambulatory Visit
Admission: RE | Admit: 2017-02-03 | Discharge: 2017-02-03 | Disposition: A | Discharge status: Home / Self Care | Payer: Medicare Other | Source: Ambulatory Visit | Attending: Internal Medicine | Admitting: Internal Medicine

## 2017-02-03 ENCOUNTER — Encounter: Payer: Self-pay | Admitting: Cardiothoracic Surgery

## 2017-02-03 ENCOUNTER — Ambulatory Visit (INDEPENDENT_AMBULATORY_CARE_PROVIDER_SITE_OTHER): Payer: Medicare Other | Admitting: Cardiothoracic Surgery

## 2017-02-03 VITALS — BP 161/72 | HR 65 | Temp 97.9°F | Wt 130.0 lb

## 2017-02-03 DIAGNOSIS — C342 Malignant neoplasm of middle lobe, bronchus or lung: Secondary | ICD-10-CM | POA: Diagnosis present

## 2017-02-03 DIAGNOSIS — C801 Malignant (primary) neoplasm, unspecified: Secondary | ICD-10-CM

## 2017-02-03 LAB — GLUCOSE, CAPILLARY: GLUCOSE-CAPILLARY: 94 mg/dL (ref 65–99)

## 2017-02-03 MED ORDER — FLUDEOXYGLUCOSE F - 18 (FDG) INJECTION
12.4800 | Freq: Once | INTRAVENOUS | Status: AC | PRN
  Administered 2017-02-03: 12.48 via INTRAVENOUS

## 2017-02-06 ENCOUNTER — Ambulatory Visit: Payer: Medicare Other | Admitting: Cardiothoracic Surgery

## 2017-02-06 ENCOUNTER — Encounter: Payer: Self-pay | Admitting: *Deleted

## 2017-02-06 ENCOUNTER — Telehealth: Payer: Self-pay | Admitting: Cardiothoracic Surgery

## 2017-02-06 NOTE — Telephone Encounter (Signed)
Pt advised of pre op date/time and sx date. Sx: 02/09/17 with Dr Rolley Sims Placement Pre op: No pre op needed per Anesthesia due to recent pre po.   Patient made aware to call [phone], between 1-3:00pm the day before surgery, to find out what time to arrive.

## 2017-02-06 NOTE — Patient Instructions (Signed)
  Your procedure is scheduled on: 02/09/17 Report to Day Surgery. [facility]OND FLOOR To find out your arrival time please call [phone] between 1PM - 3PM on  02/08/17.  Remember: Instructions that are not followed completely may result in serious medical risk, up to and including death, or upon the discretion of your surgeon and anesthesiologist your surgery may need to be rescheduled.    X____ 1. Do not eat food or drink liquids after midnight. No gum chewing or hard candies.     _X___ 2. No Alcohol for 24 hours before or after surgery.   ____ 3. Do Not Smoke For 24 Hours Prior to Your Surgery.   ____ 4. Bring all medications with you on the day of surgery if instructed.    _X___ 5. Notify your doctor if there is any change in your medical condition     (cold, fever, infections).       Do not wear jewelry, make-up, hairpins, clips or nail polish.  Do not wear lotions, powders, or perfumes. You may wear deodorant.  Do not shave 48 hours prior to surgery. Men may shave face and neck.  Do not bring valuables to the hospital.    [facility] is not responsible for any belongings or valuables.               Contacts, dentures or bridgework may not be worn into surgery.  Leave your suitcase in the car. After surgery it may be brought to your room.  For patients admitted to the hospital, discharge time is determined by your                treatment team.   Patients discharged the day of surgery will not be allowed to drive home.      _X___ Take these medicines the morning of surgery with A SIP OF WATER:    1. PEVACID AT BEDTIME  AND AM OF SURGERY  2.   3.   4.  5.  6.  ____ Fleet Enema (as directed)   ____ Use CHG Soap as directed  _X___ Use inhalers on the day of surgery  ____ Stop metformin 2 days prior to surgery    ____ Take 1/2 of usual insulin dose the night before surgery and none on the morning of surgery.   ____ Stop Coumadin/Plavix/aspirin on  ____ Stop  Anti-inflammatories on    ____ Stop supplements until after surgery.    ____ Bring C-Pap to the hospital.

## 2017-02-07 ENCOUNTER — Encounter: Payer: Self-pay | Admitting: Cardiothoracic Surgery

## 2017-02-07 ENCOUNTER — Ambulatory Visit (INDEPENDENT_AMBULATORY_CARE_PROVIDER_SITE_OTHER): Payer: Medicare Other | Admitting: Cardiothoracic Surgery

## 2017-02-07 VITALS — BP 117/86 | HR 68 | Temp 97.8°F | Resp 18 | Ht 64.0 in | Wt 130.0 lb

## 2017-02-07 DIAGNOSIS — C3401 Malignant neoplasm of right main bronchus: Secondary | ICD-10-CM | POA: Diagnosis not present

## 2017-02-07 NOTE — Progress Notes (Signed)
  Patient ID: Lauren Page, female   DOB: [birth year], 66 y.o.   MRN: [id number]  HISTORY: This patient is a 67 year old woman who is status post right thoracotomy and lung resection for adenocarcinoma. 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 There is evidence of abnormalities in the lung which may also represent a recurrence there. She was recently seen by Dr. Rogue Bussing and is scheduled to see Dr. Donella Stade for radiation therapy and chemotherapy. I've been asked to see the patient regarding port placement for her chemotherapy. 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 This has been stable over the years. She has no other significant medical problems with the exception of hypertension.   Vitals:   02/07/17 1059  BP: 117/86  Pulse: 68  Resp: 18  Temp: 97.8 F (36.6 C)     EXAM:    Resp: Lungs are clear bilaterally.  No respiratory distress, normal effort. Heart:  Regular without murmurs Abd:  Abdomen is soft, non distended and non tender. No masses are palpable.  There is no rebound and no guarding.  Neurological: Alert and oriented to person, place, and time. Coordination normal.  Skin: Skin is warm and dry. No rash noted. No diaphoretic. No erythema. No pallor.  Psychiatric: Normal mood and affect. Normal behavior. Judgment and thought content normal.    ASSESSMENT: Metastatic adenocarcinoma the lung.   PLAN:   I had a long discussion with her and her husband today regarding the indications and risks of Port-A-Cath placement. They are aware of the catheter as her husband has had one in the past. Risks of bleeding, infection, pneumothorax and death were all reviewed. I discussed with her the probability of placing this on the right side given her prior thoracotomy. She understands and would like [place] to proceed. I reviewed with  her the postoperative care. All her questions were answered. She has had laboratory studies performed at the current nodal clinic and they are all acceptable. She'll be contacted by phone for her phone preoperative evaluation.    Nestor Lewandowsky, MD

## 2017-02-07 NOTE — Patient Instructions (Signed)
Please see your blue pre-care sheet for surgery information.

## 2017-02-08 ENCOUNTER — Ambulatory Visit
Admission: RE | Admit: 2017-02-08 | Discharge: 2017-02-08 | Disposition: A | Discharge status: Home / Self Care | Payer: Medicare Other | Source: Ambulatory Visit | Attending: Radiation Oncology | Admitting: Radiation Oncology

## 2017-02-08 ENCOUNTER — Encounter: Payer: Self-pay | Admitting: Radiation Oncology

## 2017-02-08 VITALS — BP 151/84 | HR 66 | Temp 98.7°F | Resp 20 | Wt 131.8 lb

## 2017-02-08 DIAGNOSIS — C342 Malignant neoplasm of middle lobe, bronchus or lung: Secondary | ICD-10-CM | POA: Insufficient documentation

## 2017-02-08 DIAGNOSIS — Z510 Encounter for antineoplastic radiation therapy: Secondary | ICD-10-CM | POA: Insufficient documentation

## 2017-02-08 NOTE — Progress Notes (Signed)
  Patient ID: Lauren Page, female   DOB: 1950/09/19, 66 y.o.   MRN: [id number]  HISTORY: She comes in today for evaluation. She has seen the oncologist who recommended that she undergo chemotherapy. She scheduled to meet with the radiation therapist later this week. I've been asked to evaluate her for possible Port-A-Cath placement.   Vitals:   02/03/17 0955  BP: (!) 161/72  Pulse: 65  Temp: 97.9 F (36.6 C)     EXAM:    Resp: Lungs are clear bilaterally.  No respiratory distress, normal effort. Heart:  Regular without murmurs Abd:  Abdomen is soft, non distended and non tender. No masses are palpable.  There is no rebound and no guarding.  Neurological: Alert and oriented to person, place, and time. Coordination normal.  Skin: Skin is warm and dry. No rash noted. No diaphoretic. No erythema. No pallor.  Psychiatric: Normal mood and affect. Normal behavior. Judgment and thought content normal.    ASSESSMENT: I have independently reviewed with her the results of the biopsy. This does show metastatic carcinoma the lung. I also reviewed with her the indications and risks of Port-A-Cath placement. Risks of bleeding, infection, pneumothorax and death were all reviewed.   PLAN:   We'll go ahead and set her up for Port-A-Cath placement later this week. All questions were answered.    Nestor Lewandowsky, MD

## 2017-02-09 ENCOUNTER — Ambulatory Visit: Payer: Medicare Other

## 2017-02-09 ENCOUNTER — Other Ambulatory Visit: Payer: Medicare Other

## 2017-02-09 ENCOUNTER — Encounter
Admission: RE | Disposition: A | Discharge status: Home / Self Care | Payer: Self-pay | Source: Ambulatory Visit | Attending: Cardiothoracic Surgery

## 2017-02-09 ENCOUNTER — Ambulatory Visit
Admission: RE | Admit: 2017-02-09 | Discharge: 2017-02-09 | Disposition: A | Discharge status: Home / Self Care | Payer: Medicare Other | Source: Ambulatory Visit | Attending: Cardiothoracic Surgery | Admitting: Cardiothoracic Surgery

## 2017-02-09 ENCOUNTER — Ambulatory Visit: Payer: Medicare Other | Admitting: Internal Medicine

## 2017-02-09 ENCOUNTER — Ambulatory Visit: Payer: Medicare Other | Admitting: Anesthesiology

## 2017-02-09 ENCOUNTER — Encounter: Payer: Self-pay | Admitting: *Deleted

## 2017-02-09 DIAGNOSIS — I10 Essential (primary) hypertension: Secondary | ICD-10-CM | POA: Insufficient documentation

## 2017-02-09 DIAGNOSIS — Z9221 Personal history of antineoplastic chemotherapy: Secondary | ICD-10-CM | POA: Insufficient documentation

## 2017-02-09 DIAGNOSIS — C3491 Malignant neoplasm of unspecified part of right bronchus or lung: Secondary | ICD-10-CM | POA: Insufficient documentation

## 2017-02-09 DIAGNOSIS — C801 Malignant (primary) neoplasm, unspecified: Secondary | ICD-10-CM

## 2017-02-09 DIAGNOSIS — Z79899 Other long term (current) drug therapy: Secondary | ICD-10-CM | POA: Diagnosis not present

## 2017-02-09 DIAGNOSIS — J45909 Unspecified asthma, uncomplicated: Secondary | ICD-10-CM | POA: Insufficient documentation

## 2017-02-09 DIAGNOSIS — Z7951 Long term (current) use of inhaled steroids: Secondary | ICD-10-CM | POA: Insufficient documentation

## 2017-02-09 DIAGNOSIS — Z09 Encounter for follow-up examination after completed treatment for conditions other than malignant neoplasm: Secondary | ICD-10-CM

## 2017-02-09 DIAGNOSIS — C3490 Malignant neoplasm of unspecified part of unspecified bronchus or lung: Secondary | ICD-10-CM

## 2017-02-09 DIAGNOSIS — K219 Gastro-esophageal reflux disease without esophagitis: Secondary | ICD-10-CM | POA: Diagnosis not present

## 2017-02-09 DIAGNOSIS — C781 Secondary malignant neoplasm of mediastinum: Secondary | ICD-10-CM | POA: Diagnosis not present

## 2017-02-09 DIAGNOSIS — Z902 Acquired absence of lung [part of]: Secondary | ICD-10-CM | POA: Insufficient documentation

## 2017-02-09 HISTORY — PX: PORTACATH PLACEMENT: SHX2246

## 2017-02-09 SURGERY — INSERTION, TUNNELED CENTRAL VENOUS DEVICE, WITH PORT
Anesthesia: General | Site: Shoulder | Laterality: Right | Wound class: Clean

## 2017-02-09 MED ORDER — SODIUM CHLORIDE 0.9 % IJ SOLN
INTRAMUSCULAR | Status: AC
  Filled 2017-02-09: qty 10

## 2017-02-09 MED ORDER — GLYCOPYRROLATE 0.2 MG/ML IJ SOLN
INTRAMUSCULAR | Status: DC | PRN
Start: 2017-02-09 — End: 2017-02-09
  Administered 2017-02-09: 0.2 mg via INTRAVENOUS

## 2017-02-09 MED ORDER — HEPARIN SODIUM (PORCINE) 5000 UNIT/ML IJ SOLN
INTRAMUSCULAR | Status: AC
  Filled 2017-02-09: qty 1

## 2017-02-09 MED ORDER — PROPOFOL 10 MG/ML IV BOLUS
INTRAVENOUS | Status: AC
  Filled 2017-02-09: qty 20

## 2017-02-09 MED ORDER — LIDOCAINE HCL 1 % IJ SOLN
INTRAMUSCULAR | Status: DC | PRN
  Administered 2017-02-09: 22 mL

## 2017-02-09 MED ORDER — SEVOFLURANE IN SOLN
RESPIRATORY_TRACT | Status: AC
  Filled 2017-02-09: qty 250

## 2017-02-09 MED ORDER — VANCOMYCIN HCL IN DEXTROSE 1-5 GM/200ML-% IV SOLN
1000.0000 mg | INTRAVENOUS | Status: AC
  Administered 2017-02-09: 1000 mg via INTRAVENOUS

## 2017-02-09 MED ORDER — FENTANYL CITRATE (PF) 100 MCG/2ML IJ SOLN
25.0000 ug | INTRAMUSCULAR | Status: DC | PRN
  Administered 2017-02-09 (×2): 25 ug via INTRAVENOUS

## 2017-02-09 MED ORDER — FENTANYL CITRATE (PF) 100 MCG/2ML IJ SOLN
INTRAMUSCULAR | Status: AC
  Filled 2017-02-09: qty 2

## 2017-02-09 MED ORDER — LIDOCAINE HCL 2 % EX GEL
CUTANEOUS | Status: AC
  Filled 2017-02-09: qty 5

## 2017-02-09 MED ORDER — MIDAZOLAM HCL 2 MG/2ML IJ SOLN
INTRAMUSCULAR | Status: DC | PRN
  Administered 2017-02-09 (×2): 1 mg via INTRAVENOUS

## 2017-02-09 MED ORDER — OXYCODONE HCL 5 MG/5ML PO SOLN: 5.0000 mg | Freq: Once | ORAL | Status: DC | PRN

## 2017-02-09 MED ORDER — VANCOMYCIN HCL IN DEXTROSE 1-5 GM/200ML-% IV SOLN
INTRAVENOUS | Status: AC
  Administered 2017-02-09: 1000 mg via INTRAVENOUS
  Filled 2017-02-09: qty 200

## 2017-02-09 MED ORDER — LACTATED RINGERS IV SOLN
INTRAVENOUS | Status: DC
  Administered 2017-02-09 (×2): via INTRAVENOUS

## 2017-02-09 MED ORDER — MIDAZOLAM HCL 2 MG/2ML IJ SOLN
INTRAMUSCULAR | Status: AC
Start: 2017-02-09 — End: 2017-02-09
  Filled 2017-02-09: qty 2

## 2017-02-09 MED ORDER — LIDOCAINE HCL (PF) 1 % IJ SOLN
INTRAMUSCULAR | Status: AC
  Filled 2017-02-09: qty 30

## 2017-02-09 MED ORDER — FENTANYL CITRATE (PF) 100 MCG/2ML IJ SOLN
INTRAMUSCULAR | Status: AC
  Administered 2017-02-09: 25 ug via INTRAVENOUS
  Filled 2017-02-09: qty 2

## 2017-02-09 MED ORDER — OXYCODONE HCL 5 MG PO TABS: 5.0000 mg | ORAL_TABLET | Freq: Once | ORAL | Status: DC | PRN

## 2017-02-09 MED ORDER — CHLORHEXIDINE GLUCONATE CLOTH 2 % EX PADS: 6.0000 | MEDICATED_PAD | Freq: Once | CUTANEOUS | Status: DC

## 2017-02-09 MED ORDER — EPHEDRINE SULFATE 50 MG/ML IJ SOLN
INTRAMUSCULAR | Status: DC | PRN
  Administered 2017-02-09 (×3): 5 mg via INTRAVENOUS

## 2017-02-09 MED ORDER — EPHEDRINE SULFATE 50 MG/ML IJ SOLN
INTRAMUSCULAR | Status: AC
  Filled 2017-02-09: qty 2

## 2017-02-09 MED ORDER — FENTANYL CITRATE (PF) 100 MCG/2ML IJ SOLN
INTRAMUSCULAR | Status: DC | PRN
  Administered 2017-02-09 (×2): 50 ug via INTRAVENOUS

## 2017-02-09 SURGICAL SUPPLY — 40 items
BAG DECANTER FOR FLEXI CONT (MISCELLANEOUS) ×2 IMPLANT
BLADE SURG SZ11 CARB STEEL (BLADE) IMPLANT
CANISTER SUCT 1200ML W/VALVE (MISCELLANEOUS) ×2 IMPLANT
CHLORAPREP W/TINT 26ML (MISCELLANEOUS) ×2 IMPLANT
COVER LIGHT HANDLE STERIS (MISCELLANEOUS) ×4 IMPLANT
DRAPE C-ARM XRAY 36X54 (DRAPES) ×2 IMPLANT
DRSG TEGADERM 2-3/8X2-3/4 SM (GAUZE/BANDAGES/DRESSINGS) ×2 IMPLANT
DRSG TEGADERM 4X4.75 (GAUZE/BANDAGES/DRESSINGS) ×2 IMPLANT
DRSG TELFA 4X3 1S NADH ST (GAUZE/BANDAGES/DRESSINGS) ×2 IMPLANT
ELECT CAUTERY BLADE TIP 2.5 (TIP) ×2
ELECT REM PT RETURN 9FT ADLT (ELECTROSURGICAL) ×2
ELECTRODE CAUTERY BLDE TIP 2.5 (TIP) ×1 IMPLANT
ELECTRODE REM PT RTRN 9FT ADLT (ELECTROSURGICAL) ×1 IMPLANT
GLOVE SURG SYN 7.5  E (GLOVE) ×1
GLOVE SURG SYN 7.5 E (GLOVE) ×1 IMPLANT
GOWN STRL REUS W/ TWL LRG LVL3 (GOWN DISPOSABLE) ×2 IMPLANT
GOWN STRL REUS W/TWL LRG LVL3 (GOWN DISPOSABLE) ×2
IV NS 500ML (IV SOLUTION) ×1
IV NS 500ML BAXH (IV SOLUTION) ×1 IMPLANT
KIT PORT POWER 8FR ISP CVUE (Miscellaneous) ×2 IMPLANT
KIT RM TURNOVER STRD PROC AR (KITS) ×2 IMPLANT
LABEL OR SOLS (LABEL) ×2 IMPLANT
MARKER SKIN DUAL TIP RULER LAB (MISCELLANEOUS) ×2 IMPLANT
MICROPUNCTURE 5FR NT-U-SST (MISCELLANEOUS) ×2
NDL SAFETY 22GX1.5 (NEEDLE) ×2 IMPLANT
NEEDLE FILTER BLUNT 18X 1/2SAF (NEEDLE) ×1
NEEDLE FILTER BLUNT 18X1 1/2 (NEEDLE) ×1 IMPLANT
NEEDLE HYPO 22GX1.5 SAFETY (NEEDLE) ×2 IMPLANT
NS IRRIG 500ML POUR BTL (IV SOLUTION) ×2 IMPLANT
PACK PORT-A-CATH (MISCELLANEOUS) ×2 IMPLANT
SET MICROPUNCTURE 5FR NT-U-SST (MISCELLANEOUS) ×1 IMPLANT
SUT ETHILON 4-0 (SUTURE) ×2
SUT ETHILON 4-0 FS2 18XMFL BLK (SUTURE) ×2
SUT PROLENE 2 0 SH DA (SUTURE) ×4 IMPLANT
SUT VIC AB 3-0 SH 27 (SUTURE) ×1
SUT VIC AB 3-0 SH 27X BRD (SUTURE) ×1 IMPLANT
SUTURE ETHLN 4-0 FS2 18XMF BLK (SUTURE) ×2 IMPLANT
SYR 3ML LL SCALE MARK (SYRINGE) ×2 IMPLANT
SYRINGE 10CC LL (SYRINGE) ×2 IMPLANT
TAPE TRANSPORE STRL 2 31045 (GAUZE/BANDAGES/DRESSINGS) ×2 IMPLANT

## 2017-02-09 NOTE — Op Note (Signed)
02/09/2017  12:19 PM  PATIENT:  Lauren Page  66 y.o. female  PRE-OPERATIVE DIAGNOSIS:  right lung cancer  POST-OPERATIVE DIAGNOSIS:  right lung cancer  PROCEDURE:  Procedure(s): INSERTION PORT-A-CATH (Right)  SURGEON:  Surgeon(s) and Role:    Nestor Lewandowsky, MD - Primary  ASSISTANTS:    ANESTHESIA:   LMA  DICTATION:   The patient was brought to the operating suite and placed in the supine position. Using the ultrasound machine the right IJ was identified and suitable for cannulation. The patient was then prepped and draped in usual sterile fashion. The Right IJ was percutaneously catheterized. A wire was placed into the venous system under fluoroscopic guidance. An appropriate site was selected on the chest wall and a Port-A-Cath pocket was created. The catheter was tunneled from the port site up to the insertion site. The catheter was then inserted through a peel-away sheath and positioned at the appropriate level in the superior vena cava. The catheter was then assembled and aspirated and flushed easily. It was then secured to the anterior chest wall with interrupted Prolene sutures. The catheter was flushed one last time and the wounds were then closed. The subcutaneous tissues were closed with running absorbable sutures and the skin with nylon. Sterile dressings were applied. Patient was then transported to the recovery room in stable condition.   Nestor Lewandowsky, MD

## 2017-02-09 NOTE — Transfer of Care (Signed)
Immediate Anesthesia Transfer of Care Note  Patient: Lauren Page  Procedure(s) Performed: Procedure(s): INSERTION PORT-A-CATH (Right)  Patient Location: PACU  Anesthesia Type:General  Level of Consciousness: sedated and drowsy  Airway & Oxygen Therapy: Patient Spontanous Breathing and Patient connected to face mask oxygen  Post-op Assessment: Report given to RN and Post -op Vital signs reviewed and stable  Post vital signs: Reviewed and stable  Last Vitals:  Vitals:   02/09/17 0905 02/09/17 1218  BP: 137/72 124/66  Pulse: (!) 56 71  Resp: 18 (!) 8  Temp: 36.7 C (!) 36.2 C  SpO2: 100% 100%    Last Pain:  Vitals:   02/09/17 0905  TempSrc: Tympanic  PainSc: 0-No pain         Complications: No apparent anesthesia complications

## 2017-02-09 NOTE — Anesthesia Procedure Notes (Signed)
Procedure Name: LMA Insertion Date/Time: 02/09/2017 10:56 AM Performed by: Courtney Paris Pre-anesthesia Checklist: Patient identified, Emergency Drugs available, Suction available, Patient being monitored and Timeout performed Patient Re-evaluated:Patient Re-evaluated prior to induction Oxygen Delivery Method: Circle system utilized Preoxygenation: Pre-oxygenation with 100% oxygen Induction Type: IV induction Ventilation: Mask ventilation without difficulty LMA: LMA inserted LMA Size: 3.0 Grade View: Grade II Tube type: Oral Placement Confirmation: positive ETCO2,  CO2 detector and breath sounds checked- equal and bilateral Tube secured with: Tape Dental Injury: Teeth and Oropharynx as per pre-operative assessment

## 2017-02-09 NOTE — Anesthesia Preprocedure Evaluation (Signed)
Anesthesia Evaluation  Patient identified by MRN, date of birth, ID band Patient awake    Reviewed: Allergy & Precautions, H&P , NPO status , Patient's Chart, lab work & pertinent test results  History of Anesthesia Complications Negative for: history of anesthetic complications  Airway Mallampati: III  TM Distance: <3 FB Neck ROM: limited    Dental  (+) Chipped, Caps   Pulmonary neg shortness of breath, asthma ,           Cardiovascular Exercise Tolerance: Good hypertension, (-) angina(-) Past MI and (-) DOE      Neuro/Psych negative neurological ROS  negative psych ROS   GI/Hepatic Neg liver ROS, GERD  Medicated and Controlled,  Endo/Other  negative endocrine ROS  Renal/GU      Musculoskeletal  (+) Arthritis ,   Abdominal   Peds  Hematology negative hematology ROS (+)   Anesthesia Other Findings Past Medical History: No date: Acid reflux No date: Anemia     Comment:  HAD TO HAVE IRON INFUSIONS BACK IN 2015 No date: Arthritis     Comment:  bil hands and back No date: Asthma 2015: Cancer of right lung (HCC)     Comment:  lung cancer - chemo, Dr Genevive Bi removed RML Lobectomy.  No date: Dyspnea     Comment:  WITH EXERTION No date: Hypertension No date: Mitral valve prolapse  Past Surgical History: No date: ABDOMINAL HYSTERECTOMY 1994 (-), 2004 (-): BREAST BIOPSY; Left 2008 : COLONOSCOPY     Comment:  Dr. Donnella Sham 2007: KNEE SURGERY No date: LUNG LOBECTOMY; Right     Comment:  middle lobe 1983: SALIVARY GLAND SURGERY; Left     Comment:  with lymph node removal also 2008: UPPER GI ENDOSCOPY 01/24/2017: VIDEO BRONCHOSCOPY WITH ENDOBRONCHIAL ULTRASOUND; Right     Comment:  Procedure: VIDEO BRONCHOSCOPY WITH ENDOBRONCHIAL               ULTRASOUND;  Surgeon: [name], MD;                Location: [facility] ORS;  Service: Pulmonary;  Laterality:               Right;  BMI    Body Mass Index:  22.49  kg/m      Reproductive/Obstetrics negative OB ROS                             Anesthesia Physical Anesthesia Plan  ASA: III  Anesthesia Plan: General LMA   Post-op Pain Management:    Induction: Intravenous  PONV Risk Score and Plan: 3 and Ondansetron, Dexamethasone, Midazolam, Propofol infusion and Treatment may vary due to age or medical condition  Airway Management Planned: LMA  Additional Equipment:   Intra-op Plan:   Post-operative Plan: Extubation in OR  Informed Consent: I have reviewed the patients History and Physical, chart, labs and discussed the procedure including the risks, benefits and alternatives for the proposed anesthesia with the patient or authorized representative who has indicated his/her understanding and acceptance.   Dental Advisory Given  Plan Discussed with: Anesthesiologist, CRNA and Surgeon  Anesthesia Plan Comments: (Patient consented for risks of anesthesia including but not limited to:  - adverse reactions to medications - damage to teeth, lips or other oral mucosa - sore throat or hoarseness - Damage to heart, brain, lungs or loss of life  Patient voiced understanding.)        Anesthesia Quick Evaluation

## 2017-02-09 NOTE — Discharge Instructions (Signed)
AMBULATORY SURGERY  DISCHARGE INSTRUCTIONS   1) The drugs that you were given will stay in your system until tomorrow so for the next 24 hours you should not:  A) Drive an automobile B) Make any legal decisions C) Drink any alcoholic beverage   2) You may resume regular meals tomorrow.  Today it is better to start with liquids and gradually work up to solid foods.  You may eat anything you prefer, but it is better to start with liquids, then soup and crackers, and gradually work up to solid foods.   3) Please notify your doctor immediately if you have any unusual bleeding, trouble breathing, redness and pain at the surgery site, drainage, fever, or pain not relieved by medication.    4) Additional Instructions:  No repetitive activity with the right arm for a few days.  No heavy lifting with right arm      Please contact your physician with any problems or Same Day Surgery at [phone], Monday through Friday 6 am to 4 pm, or [place] at [facility] number at [phone].

## 2017-02-09 NOTE — Consult Note (Signed)
NEW PATIENT EVALUATION  Name: Lauren Page  MRN: [id number]  Date:   02/08/2017     DOB: January 20, 1951   This 66 y.o. 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  REFERRING PHYSICIAN: [name], MD  CHIEF COMPLAINT:  Chief Complaint  Patient presents with  . Lung Cancer    Pt is here for initial consultation of lung cancer.      DIAGNOSIS: The encounter diagnosis was Malignant neoplasm of middle lobe, bronchus or lung ([place]).   PREVIOUS INVESTIGATIONS:  PET CT scan and CT scans reviewed Pathology reports reviewed Clinical notes reviewed  HPI: patient is a 66 year old female initially presented back in May 2015 with a right middle lobe lesion status post right middle lobe resection for a T2 1 N1 M0 stage IIIa adenocarcinoma with EGFR mutation present.tumor had 40% lipidic features and 10% acinar features histologically grade 3 with visceral pleural involvement present. She underwent chemotherapy with cis-platinum Alimta after surgery. Her tumor had visceral pleural involvement. She was in initially randomized on the [facility] protocol although discontinued protocol which was Tarceva versus placebo because of GI side effects.she underwent a repeat CT scan in June 2018 showing increasing hilar adenopathy and PET/CT scan showed no significant interval change from June 2018 or March 2018 although there is a 10 mm nodules appear segment of the right lower lobe which is been stable with low level hypermetabolic activity. There is also hypermetabolic cannulation the anterior right sixth rib which is stable. There are some slight hypermetabolic activity in the right hilar region. She underwent E BUSwith cytology positive of a right paratracheal lymph node for metastatic non-small cell lung cancer favoring adenocarcinoma.  She's been seen by medical oncology I been asked to evaluate her for possible radiation therapy with concurrent chemotherapy to her chest. She is doing fairly well has a nonproductive chronic cough. She specifically denies hemoptysis or chest tightness.  PLANNED TREATMENT REGIMEN: right hilar radiation therapy with concurrent chemotherapy  PAST MEDICAL HISTORY:  has a past medical history of Acid reflux; Anemia; Arthritis; Asthma; Cancer of right lung ([place]) (2015); Dyspnea; Hypertension; and Mitral valve prolapse.    PAST SURGICAL HISTORY:  Past Surgical History:  Procedure Laterality Date  . ABDOMINAL HYSTERECTOMY    . BREAST BIOPSY Left 1994 (-), 2004 (-)  . COLONOSCOPY  2008    Dr. Donnella Sham  . KNEE SURGERY  2007  . LUNG LOBECTOMY Right    middle lobe  . SALIVARY GLAND SURGERY Left 1983   with lymph node removal also  . UPPER GI ENDOSCOPY  2008  . VIDEO BRONCHOSCOPY WITH ENDOBRONCHIAL ULTRASOUND Right 01/24/2017   Procedure: VIDEO BRONCHOSCOPY WITH ENDOBRONCHIAL ULTRASOUND;  Surgeon: [name], MD;  Location: [facility] ORS;  Service: Pulmonary;  Laterality: Right;    FAMILY HISTORY: family history includes Breast cancer in her maternal aunt, maternal aunt, maternal grandmother, paternal aunt, and paternal aunt; Healthy in her father and mother.  SOCIAL HISTORY:  reports that she has never smoked. She has never used smokeless tobacco. She reports that she drinks about 4.2 oz of alcohol per week . She reports that she does not use drugs.  ALLERGIES: Penicillins and Sulfa antibiotics  MEDICATIONS:  No current facility-administered medications for this encounter.    No current outpatient prescriptions on file.   Facility-Administered Medications Ordered in Other Encounters  Medication  Dose Route Frequency Provider Last Rate Last Dose  . vancomycin (VANCOCIN) IVPB 1000 mg/200 mL premix  1,000 mg Intravenous On Call to Glen White, MD        ECOG PERFORMANCE STATUS:  1 -  Symptomatic but completely ambulatory  REVIEW OF SYSTEMS: except for the cough  Patient denies any weight loss, fatigue, weakness, fever, chills or night sweats. Patient denies any loss of vision, blurred vision. Patient denies any ringing  of the ears or hearing loss. No irregular heartbeat. Patient denies heart murmur or history of fainting. Patient denies any chest pain or pain radiating to her upper extremities. Patient denies any shortness of breath, difficulty breathing at night, cough or hemoptysis. Patient denies any swelling in the lower legs. Patient denies any nausea vomiting, vomiting of blood, or coffee ground material in the vomitus. Patient denies any stomach pain. Patient states has had normal bowel movements no significant constipation or diarrhea. Patient denies any dysuria, hematuria or significant nocturia. Patient denies any problems walking, swelling in the joints or loss of balance. Patient denies any skin changes, loss of hair or loss of weight. Patient denies any excessive worrying or anxiety or significant depression. Patient denies any problems with insomnia. Patient denies excessive thirst, polyuria, polydipsia. Patient denies any swollen glands, patient denies easy bruising or easy bleeding. Patient denies any recent infections, allergies or URI. Patient "s visual fields have not changed significantly in recent time.   PHYSICAL EXAM: BP (!) 151/84   Pulse 66   Temp 98.7 F (37.1 C)   Resp 20   Wt 131 lb 13.4 oz (59.8 kg)   BMI 22.63 kg/m  Well-developed well-nourished patient in NAD. HEENT reveals PERLA, EOMI, discs not visualized.  Oral cavity is clear. No oral mucosal lesions are identified. Neck is clear without evidence of cervical or supraclavicular adenopathy. Lungs are clear to A&P. Cardiac examination is essentially unremarkable with regular rate and rhythm without murmur rub or thrill. Abdomen is benign with no organomegaly or masses noted. Motor sensory and DTR  levels are equal and symmetric in the upper and lower extremities. Cranial nerves II through XII are grossly intact. Proprioception is intact. No peripheral adenopathy or edema is identified. No motor or sensory levels are noted. Crude visual fields are within normal range.  LABORATORY DATA: pathology reports reviewed    RADIOLOGY RESULTS:CT scan and serial PET/CT scans reviewed   IMPRESSION: recurrent adenocarcinoma the right hilum in patient status post right middle lobectomy in 2015 for adenocarcinoma stage IIIa disease  PLAN: at this time I to go ahead with radiation therapy to her right hilum. I would plan on delivering 6400 cGy over 6 weeks using IM RT radiation therapy for treatment planning and delivery. I would choose I am RT to spare critical structures such as her esophagus cardiac structures normal lung volume and spinal cord which all are in close proximity to this lesion. I would also use PET CT fusion study for tumor volumes. We will treat her along with concurrent chemotherapy under medical oncology's direction.There will be extra effort by both professional staff as well as technical staff to coordinate and manage concurrent chemoradiation and ensuing side effects during her treatments.the other nodule in thesuperior segment of the right lower lobe I will continue to observe at this point. Should that progress would use SB RT treatment. Including that lesion as well as her mediastinal adenopathy would expose significant amount of lung tissue to radiation. Risks and benefits of treatment including  possible increased cough fatigue dysphasia from radiation esophagitis skin reaction alteration of blood counts all were discussed in detail with the patient and her husband. I personally set up and ordered CT simulation with forward the study for next week.  I would like to take this opportunity to thank you for allowing me to participate in the care of your patient.Armstead Peaks.,  MD

## 2017-02-09 NOTE — Anesthesia Post-op Follow-up Note (Signed)
Anesthesia QCDR form completed.        

## 2017-02-10 ENCOUNTER — Telehealth: Payer: Self-pay

## 2017-02-10 NOTE — Anesthesia Postprocedure Evaluation (Signed)
Anesthesia Post Note  Patient: Lauren Page  Procedure(s) Performed: Procedure(s) (LRB): INSERTION PORT-A-CATH (Right)  Patient location during evaluation: PACU Anesthesia Type: General Level of consciousness: awake and alert Pain management: pain level controlled Vital Signs Assessment: post-procedure vital signs reviewed and stable Respiratory status: spontaneous breathing, nonlabored ventilation, respiratory function stable and patient connected to nasal cannula oxygen Cardiovascular status: blood pressure returned to baseline and stable Postop Assessment: no signs of nausea or vomiting Anesthetic complications: no     Last Vitals:  Vitals:   02/09/17 1312 02/09/17 1333  BP: (!) 153/76 (!) 149/80  Pulse: 60 70  Resp: 16 16  Temp: (!) 36.1 C   SpO2: 100% 97%    Last Pain:  Vitals:   02/09/17 1333  TempSrc:   PainSc: 2                  Precious Haws Rhina Kramme

## 2017-02-10 NOTE — Telephone Encounter (Signed)
Post-op call made to patient at this time. Spoke with Patient. Post-op interview questions below.  1. How are you feeling? Having some pain but overall doing well.  2. Is your pain controlled? yes  3. What are you doing for the pain? Taking Tylenol and it is well controlled.  4. Are you having any Nausea or Vomiting? No  5. Are you having any Fever or Chills? No  6. Are you having any Constipation or Diarrhea? No  7. Is there any Swelling or Bruising you are concerned about? No  8. Do you have any questions or concerns at this time? No

## 2017-02-13 ENCOUNTER — Ambulatory Visit: Payer: Medicare Other

## 2017-02-13 ENCOUNTER — Inpatient Hospital Stay: Payer: Medicare Other | Attending: Internal Medicine

## 2017-02-13 ENCOUNTER — Inpatient Hospital Stay (HOSPITAL_BASED_OUTPATIENT_CLINIC_OR_DEPARTMENT_OTHER): Payer: Medicare Other | Admitting: Internal Medicine

## 2017-02-13 ENCOUNTER — Other Ambulatory Visit: Payer: Self-pay

## 2017-02-13 ENCOUNTER — Inpatient Hospital Stay: Payer: Medicare Other

## 2017-02-13 VITALS — BP 124/75 | HR 65 | Resp 18

## 2017-02-13 VITALS — BP 134/85 | HR 68 | Temp 98.6°F | Resp 16 | Wt 130.0 lb

## 2017-02-13 DIAGNOSIS — C342 Malignant neoplasm of middle lobe, bronchus or lung: Secondary | ICD-10-CM | POA: Diagnosis not present

## 2017-02-13 DIAGNOSIS — M1990 Unspecified osteoarthritis, unspecified site: Secondary | ICD-10-CM | POA: Diagnosis not present

## 2017-02-13 DIAGNOSIS — J45909 Unspecified asthma, uncomplicated: Secondary | ICD-10-CM | POA: Diagnosis not present

## 2017-02-13 DIAGNOSIS — Z5111 Encounter for antineoplastic chemotherapy: Secondary | ICD-10-CM | POA: Insufficient documentation

## 2017-02-13 DIAGNOSIS — Z79899 Other long term (current) drug therapy: Secondary | ICD-10-CM

## 2017-02-13 DIAGNOSIS — I341 Nonrheumatic mitral (valve) prolapse: Secondary | ICD-10-CM | POA: Insufficient documentation

## 2017-02-13 DIAGNOSIS — K219 Gastro-esophageal reflux disease without esophagitis: Secondary | ICD-10-CM | POA: Diagnosis not present

## 2017-02-13 DIAGNOSIS — I10 Essential (primary) hypertension: Secondary | ICD-10-CM | POA: Diagnosis not present

## 2017-02-13 LAB — CBC WITH DIFFERENTIAL/PLATELET
BASOS ABS: 0 10*3/uL (ref 0–0.1)
Basophils Relative: 1 %
EOS ABS: 0.2 10*3/uL (ref 0–0.7)
EOS PCT: 4 %
HCT: 36.2 % (ref 35.0–47.0)
Hemoglobin: 12.7 g/dL (ref 12.0–16.0)
Lymphocytes Relative: 22 %
Lymphs Abs: 0.9 10*3/uL — ABNORMAL LOW (ref 1.0–3.6)
MCH: 31.1 pg (ref 26.0–34.0)
MCHC: 35.1 g/dL (ref 32.0–36.0)
MCV: 88.7 fL (ref 80.0–100.0)
Monocytes Absolute: 0.4 10*3/uL (ref 0.2–0.9)
Monocytes Relative: 11 %
Neutro Abs: 2.5 10*3/uL (ref 1.4–6.5)
Neutrophils Relative %: 62 %
PLATELETS: 276 10*3/uL (ref 150–440)
RBC: 4.08 MIL/uL (ref 3.80–5.20)
RDW: 13.5 % (ref 11.5–14.5)
WBC: 3.9 10*3/uL (ref 3.6–11.0)

## 2017-02-13 LAB — COMPREHENSIVE METABOLIC PANEL
ALT: 26 U/L (ref 14–54)
AST: 35 U/L (ref 15–41)
Albumin: 3.9 g/dL (ref 3.5–5.0)
Alkaline Phosphatase: 66 U/L (ref 38–126)
Anion gap: 7 (ref 5–15)
BUN: 14 mg/dL (ref 6–20)
CHLORIDE: 102 mmol/L (ref 101–111)
CO2: 27 mmol/L (ref 22–32)
CREATININE: 0.74 mg/dL (ref 0.44–1.00)
Calcium: 9.2 mg/dL (ref 8.9–10.3)
GFR calc non Af Amer: >60 mL/min (ref >60)
Glucose, Bld: 99 mg/dL (ref 65–99)
Potassium: 4.1 mmol/L (ref 3.5–5.1)
SODIUM: 136 mmol/L (ref 135–145)
Total Bilirubin: 0.8 mg/dL (ref 0.3–1.2)
Total Protein: 7.1 g/dL (ref 6.5–8.1)

## 2017-02-13 MED ORDER — SODIUM CHLORIDE 0.9 % IV SOLN
45.0000 mg/m2 | Freq: Once | INTRAVENOUS | Status: AC
  Administered 2017-02-13: 72 mg via INTRAVENOUS
  Filled 2017-02-13: qty 12

## 2017-02-13 MED ORDER — SODIUM CHLORIDE 0.9 % IV SOLN
Freq: Once | INTRAVENOUS | Status: AC
  Administered 2017-02-13: 12:00:00 via INTRAVENOUS
  Filled 2017-02-13: qty 1000

## 2017-02-13 MED ORDER — PROCHLORPERAZINE MALEATE 10 MG PO TABS: 10.0000 mg | ORAL_TABLET | Freq: Four times a day (QID) | ORAL | 1 refills | Status: DC | PRN

## 2017-02-13 MED ORDER — SODIUM CHLORIDE 0.9 % IV SOLN
152.4000 mg | Freq: Once | INTRAVENOUS | Status: AC
  Administered 2017-02-13: 150 mg via INTRAVENOUS
  Filled 2017-02-13: qty 15

## 2017-02-13 MED ORDER — ONDANSETRON HCL 8 MG PO TABS: 8.0000 mg | ORAL_TABLET | Freq: Three times a day (TID) | ORAL | 1 refills | Status: AC | PRN

## 2017-02-13 MED ORDER — SODIUM CHLORIDE 0.9 % IV SOLN
20.0000 mg | Freq: Once | INTRAVENOUS | Status: AC
  Administered 2017-02-13: 20 mg via INTRAVENOUS
  Filled 2017-02-13: qty 2

## 2017-02-13 MED ORDER — FAMOTIDINE IN NACL 20-0.9 MG/50ML-% IV SOLN
20.0000 mg | Freq: Once | INTRAVENOUS | Status: AC
  Administered 2017-02-13: 20 mg via INTRAVENOUS
  Filled 2017-02-13: qty 50

## 2017-02-13 MED ORDER — METOCLOPRAMIDE HCL 10 MG PO TABS: 10.0000 mg | ORAL_TABLET | Freq: Three times a day (TID) | ORAL | 3 refills | Status: DC | PRN

## 2017-02-13 MED ORDER — PALONOSETRON HCL INJECTION 0.25 MG/5ML
0.2500 mg | Freq: Once | INTRAVENOUS | Status: AC
  Administered 2017-02-13: 0.25 mg via INTRAVENOUS
  Filled 2017-02-13: qty 5

## 2017-02-13 MED ORDER — LIDOCAINE-PRILOCAINE 2.5-2.5 % EX CREA: 1.0000 "application " | TOPICAL_CREAM | CUTANEOUS | 0 refills | Status: AC | PRN

## 2017-02-13 MED ORDER — DIPHENHYDRAMINE HCL 50 MG/ML IJ SOLN
50.0000 mg | Freq: Once | INTRAMUSCULAR | Status: AC
  Administered 2017-02-13: 50 mg via INTRAVENOUS
  Filled 2017-02-13: qty 1

## 2017-02-13 MED ORDER — HEPARIN SOD (PORK) LOCK FLUSH 100 UNIT/ML IV SOLN
500.0000 [IU] | Freq: Once | INTRAVENOUS | Status: AC | PRN
  Administered 2017-02-13: 500 [IU]
  Filled 2017-02-13: qty 5

## 2017-02-13 NOTE — Progress Notes (Signed)
START OFF PATHWAY REGIMEN - Non-Small Cell Lung   [id number]:Carboplatin AUC 2 + Paclitaxel 50 mg/m2 weekly + RT:   Administer weekly during RT:     Paclitaxel      Carboplatin   **Always confirm dose/schedule in your pharmacy ordering system**    Patient Characteristics: Local Recurrence AJCC T Category: TX Current Disease Status: Local Recurrence AJCC N Category: NX AJCC M Category: M0 AJCC 8 Stage Grouping: IIIB Intent of Therapy: Non-Curative / Palliative Intent, Discussed with Patient

## 2017-02-13 NOTE — Progress Notes (Signed)
[place] OFFICE PROGRESS NOTE  Patient Care Team: [name], MD as PCP - General (Internal Medicine)  Cancer Staging No matching staging information was found for the patient.   Oncology History   # July-AUG 2018- RECURRENT Adeno Ca [EBUS; Right paratrach LN [Dr.Ram & Dr.Oaks]insuff tissue for testing; check liquid Bx]; PET- no distant mets.  # AUG 13th 2018- Carbo-Taxol with RT  ------------------------------------------------------------------------------   # MAY 2015- .Marland KitchenA right middle lobe adenocarcinoma s/p resection [Dr.Oaks].  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  may of 2015 4.Patient has finished 4 cycles of chemotherapy on January 23, 2014 Now patient would be randomized   to  our [facility] protocol for Tarceva vs. placebo because of EGFR mutation 3.patient desires to get off protocol because of progressive side effect even after reducing dose.  (May 13, 2014) 5.Patient is starting  maintenance investigationally approach with Tarceva vs. placebo September of 2015. 6.Patient came off protocol therapy because of significant GI side effect May 13, 2014   Patient is nonsmoker;        Primary cancer of right middle lobe of lung ([place])     INTERVAL HISTORY:  Lauren Page 66 y.o.  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   The interim patient had a PET scan; she also had a Mediport placed.  She has mild shortness of breath with exertion. Otherwise no significant cough. Denies any weight loss. Denies any headaches. No chest pain. She denies any headaches.  REVIEW OF SYSTEMS:  A complete 10 point review of system is done which is negative except mentioned above/history of present illness.   PAST MEDICAL HISTORY :  Past Medical History:  Diagnosis Date   . Acid reflux   . Anemia    HAD TO HAVE IRON INFUSIONS BACK IN 2015  . Arthritis    bil hands and back  . Asthma   . Cancer of right lung ([place]) 2015   lung cancer - chemo, Dr Genevive Bi removed RML Lobectomy.   Marland Kitchen Dyspnea    WITH EXERTION  . Hypertension   . Mitral valve prolapse     PAST SURGICAL HISTORY :   Past Surgical History:  Procedure Laterality Date  . ABDOMINAL HYSTERECTOMY    . BREAST BIOPSY Left 1994 (-), 2004 (-)  . COLONOSCOPY  2008    Dr. Donnella Sham  . KNEE SURGERY  2007  . LUNG LOBECTOMY Right    middle lobe  . PORTACATH PLACEMENT Right 02/09/2017   Procedure: INSERTION PORT-A-CATH;  Surgeon: Nestor Lewandowsky, MD;  Location: [facility] ORS;  Service: General;  Laterality: Right;  . SALIVARY GLAND SURGERY Left 1983   with lymph node removal also  . UPPER GI ENDOSCOPY  2008  . VIDEO BRONCHOSCOPY WITH ENDOBRONCHIAL ULTRASOUND Right 01/24/2017   Procedure: VIDEO BRONCHOSCOPY WITH ENDOBRONCHIAL ULTRASOUND;  Surgeon: [name], MD;  Location: [facility] ORS;  Service: Pulmonary;  Laterality: Right;    FAMILY HISTORY :   Family History  Problem Relation Age of Onset  . Breast cancer Maternal Aunt   . Breast cancer Paternal Aunt   . Breast cancer Maternal Grandmother   . Breast cancer Maternal Aunt   . Breast cancer Paternal Aunt   . Healthy Mother   . Healthy Father     SOCIAL HISTORY:   Social History  Substance Use Topics  . Smoking status: Never Smoker  . Smokeless tobacco: Never Used  . Alcohol use 4.2 oz/week    7 Glasses of wine per week    ALLERGIES:  is allergic to penicillins and sulfa antibiotics.  MEDICATIONS:  Current Outpatient Prescriptions  Medication Sig Dispense Refill  . albuterol (PROVENTIL HFA;VENTOLIN HFA) 108 (90 Base) MCG/ACT inhaler Inhale 1-2 puffs into the lungs every 6 (six) hours as needed for wheezing or shortness of breath.    . B Complex Vitamins (VITAMIN B COMPLEX PO) Take 1 tablet by mouth daily.     . Biotin w/ Vitamins C & E  (HAIR/SKIN/NAILS PO) Take 1 tablet by mouth daily.    . Calcium Carb-Cholecalciferol (CALCIUM 600+D) 600-800 MG-UNIT TABS Take 2 tablets by mouth daily.    . Cholecalciferol (D3 MAXIMUM STRENGTH) 5000 units capsule Take 5,000 Units by mouth daily.    . COLLAGEN PO Take 1,000 mg by mouth daily.    . cyclobenzaprine (FLEXERIL) 5 MG tablet Take 5 mg by mouth 3 (three) times daily as needed for muscle spasms.    . fexofenadine (ALLEGRA) 180 MG tablet Take 180 mg by mouth daily as needed for allergies.     . fluticasone (FLONASE) 50 MCG/ACT nasal spray Place 2 sprays into both nostrils daily as needed for allergies or rhinitis.    . Fluticasone-Salmeterol (ADVAIR) 250-50 MCG/DOSE AEPB Inhale 1 puff into the lungs 2 (two) times daily as needed (for respiratory issues.).    Marland Kitchen lansoprazole (PREVACID) 30 MG capsule Take 30 mg by mouth every morning.     Marland Kitchen losartan (COZAAR) 25 MG tablet Take 25 mg by mouth at bedtime.     . lidocaine-prilocaine (EMLA) cream Apply 1 application topically as needed. Apply generously over the Mediport 45 minutes prior to chemotherapy. 30 g 0  . metoCLOPramide (REGLAN) 10 MG tablet Take 1 tablet (10 mg total) by mouth every 8 (eight) hours as needed for nausea. 40 tablet 3  . ondansetron (ZOFRAN) 8 MG tablet Take 1 tablet (8 mg total) by mouth every 8 (eight) hours as needed for nausea or vomiting (start 3 days; after chemo). 40 tablet 1  . prochlorperazine (COMPAZINE) 10 MG tablet Take 1 tablet (10 mg total) by mouth every 6 (six) hours as needed for nausea or vomiting. 40 tablet 1   No current facility-administered medications for this visit.    Facility-Administered Medications Ordered in Other Visits  Medication Dose Route Frequency Provider Last Rate Last Dose  . CARBOplatin (PARAPLATIN) 150 mg in sodium chloride 0.9 % 250 mL chemo infusion  150 mg Intravenous Once Charlaine Dalton R, MD      . heparin lock flush 100 unit/mL  500 Units Intracatheter Once PRN  Cammie Sickle, MD      . PACLitaxel (TAXOL) 72 mg in sodium chloride 0.9 % 250 mL chemo infusion (</= 71m/m2)  45 mg/m2 (Treatment Plan Recorded) Intravenous Once BCammie Sickle MD 262 mL/hr at 02/13/17 1251 72 mg at 02/13/17 1251    PHYSICAL EXAMINATION: ECOG PERFORMANCE STATUS: 0 - Asymptomatic  BP 134/85 (BP Location: Left Arm, Patient Position: Sitting)   Pulse 68   Temp 98.6 F (37 C) (Tympanic)   Resp 16   Wt 130 lb (59 kg)   BMI 22.31 kg/m   Filed Weights   02/13/17 1020  Weight: 130 lb (59 kg)    GENERAL: Well-nourished well-developed; Alert, no distress and comfortable.  Accompanied by family. EYES: no pallor  or icterus OROPHARYNX: no thrush or ulceration; good dentition  NECK: supple, no masses felt LYMPH:  no palpable lymphadenopathy in the cervical, axillary or inguinal regions LUNGS: clear to auscultation and  No wheeze or crackles HEART/CVS: regular rate & rhythm and no murmurs; No lower extremity edema ABDOMEN:abdomen soft, non-tender and normal bowel sounds Musculoskeletal:no cyanosis of digits and no clubbing  PSYCH: alert & oriented x 3 with fluent speech NEURO: no focal motor/sensory deficits SKIN:  no rashes or significant lesions  LABORATORY DATA:  I have reviewed the data as listed    Component Value Date/Time   NA 136 02/13/2017 0957   NA 141 06/03/2014 1039   K 4.1 02/13/2017 0957   K 4.1 06/03/2014 1039   CL 102 02/13/2017 0957   CL 105 06/03/2014 1039   CO2 27 02/13/2017 0957   CO2 28 06/03/2014 1039   GLUCOSE 99 02/13/2017 0957   GLUCOSE 88 06/03/2014 1039   BUN 14 02/13/2017 0957   BUN 14 06/03/2014 1039   CREATININE 0.74 02/13/2017 0957   CREATININE 0.68 06/03/2014 1039   CALCIUM 9.2 02/13/2017 0957   CALCIUM 8.7 06/03/2014 1039   PROT 7.1 02/13/2017 0957   PROT 7.6 06/03/2014 1039   ALBUMIN 3.9 02/13/2017 0957   ALBUMIN 3.7 06/03/2014 1039   AST 35 02/13/2017 0957   AST 36 06/03/2014 1039   ALT 26 02/13/2017  0957   ALT 34 06/03/2014 1039   ALKPHOS 66 02/13/2017 0957   ALKPHOS 86 06/03/2014 1039   BILITOT 0.8 02/13/2017 0957   BILITOT 0.4 06/03/2014 1039   GFRNONAA >60 02/13/2017 0957   GFRNONAA >60 06/03/2014 1039   GFRNONAA >60 03/20/2014 1109   GFRAA >60 02/13/2017 0957   GFRAA >60 06/03/2014 1039   GFRAA >60 03/20/2014 1109    No results found for: SPEP, UPEP  Lab Results  Component Value Date   WBC 3.9 02/13/2017   NEUTROABS 2.5 02/13/2017   HGB 12.7 02/13/2017   HCT 36.2 02/13/2017   MCV 88.7 02/13/2017   PLT 276 02/13/2017      Chemistry      Component Value Date/Time   NA 136 02/13/2017 0957   NA 141 06/03/2014 1039   K 4.1 02/13/2017 0957   K 4.1 06/03/2014 1039   CL 102 02/13/2017 0957   CL 105 06/03/2014 1039   CO2 27 02/13/2017 0957   CO2 28 06/03/2014 1039   BUN 14 02/13/2017 0957   BUN 14 06/03/2014 1039   CREATININE 0.74 02/13/2017 0957   CREATININE 0.68 06/03/2014 1039      Component Value Date/Time   CALCIUM 9.2 02/13/2017 0957   CALCIUM 8.7 06/03/2014 1039   ALKPHOS 66 02/13/2017 0957   ALKPHOS 86 06/03/2014 1039   AST 35 02/13/2017 0957   AST 36 06/03/2014 1039   ALT 26 02/13/2017 0957   ALT 34 06/03/2014 1039   BILITOT 0.8 02/13/2017 0957   BILITOT 0.4 06/03/2014 1039     IMPRESSION: 1. Interval increase in size of bilateral hilar lymph nodes. The right hilar node now measures 1.6 cm. 2. Stable index nodule within the superior segment of right lower lobe. Similar appearance of subpleural nodularity overlying the right lung and extending along the major fissure. 3.  Aortic Atherosclerosis (ICD10-I70.0).   Electronically Signed   By: Kerby Moors M.D.   On: 12/16/2016 13:18  RADIOGRAPHIC STUDIES: I have personally reviewed the radiological images as listed and agreed with the findings in the report. No results  found.   ASSESSMENT & PLAN:  Primary cancer of right middle lobe of lung ([place]) # Recurrent STAGE III lung ca [EGFR  mutated; positive for adenocarcinoma of the right paratracheal lymph node]. July 2018- s/p EBUS of right paratrach LN- POSITIVE for Recurrence- Adeno Ca. PET- node contents of distant metastatic disease.   # Given local recurrence recommend concurrent chemoradiation- carbotaxol weekly- awaiting starting radiation most likely next week.   # Discussed the potential side effects including but not limited to-increasing fatigue, nausea vomiting, diarrhea, hair loss, sores in the mouth, increase risk of infection and also neuropathy.   # Antiemetics-Zofran and Compazine;reglan.  EMLA cream sent to pharmacy.  # follow up in 2 weeks/chemo-MD/ weekly-labs-chemo.  # I reviewed the blood work- with the patient in detail; also reviewed the imaging independently [as summarized above]; and with the patient in detail.       Orders Placed This Encounter  Procedures  . CBC with Differential    Standing Status:   Standing    Number of Occurrences:   20    Standing Expiration Date:   02/13/2018  . Basic metabolic panel    Standing Status:   Future    Standing Expiration Date:   02/13/2018  . Comprehensive metabolic panel    Standing Status:   Standing    Number of Occurrences:   20    Standing Expiration Date:   02/13/2018   All questions were answered. The patient knows to call the clinic with any problems, questions or concerns.      Cammie Sickle, MD 02/13/2017 1:02 PM

## 2017-02-13 NOTE — Progress Notes (Signed)
Patient is here today for a follow up.  Patient reports no new concerns today.

## 2017-02-13 NOTE — Assessment & Plan Note (Addendum)
#  Recurrent STAGE III lung ca [EGFR mutated; positive for adenocarcinoma of the right paratracheal lymph node]. July 2018- s/p EBUS of right paratrach LN- POSITIVE for Recurrence- Adeno Ca. PET- node contents of distant metastatic disease.   # Given local recurrence recommend concurrent chemoradiation- carbotaxol weekly- awaiting starting radiation most likely next week.   # Discussed the potential side effects including but not limited to-increasing fatigue, nausea vomiting, diarrhea, hair loss, sores in the mouth, increase risk of infection and also neuropathy.   # Antiemetics-Zofran and Compazine;reglan.  EMLA cream sent to pharmacy.  # follow up in 2 weeks/chemo-MD/ weekly-labs-chemo.  # I reviewed the blood work- with the patient in detail; also reviewed the imaging independently [as summarized above]; and with the patient in detail.

## 2017-02-15 ENCOUNTER — Ambulatory Visit
Admission: RE | Admit: 2017-02-15 | Discharge: 2017-02-15 | Disposition: A | Discharge status: Home / Self Care | Payer: Medicare Other | Source: Ambulatory Visit | Attending: Radiation Oncology | Admitting: Radiation Oncology

## 2017-02-15 DIAGNOSIS — Z510 Encounter for antineoplastic radiation therapy: Secondary | ICD-10-CM | POA: Diagnosis present

## 2017-02-15 DIAGNOSIS — C342 Malignant neoplasm of middle lobe, bronchus or lung: Secondary | ICD-10-CM | POA: Diagnosis not present

## 2017-02-17 ENCOUNTER — Encounter: Payer: Self-pay | Admitting: Cardiothoracic Surgery

## 2017-02-17 ENCOUNTER — Ambulatory Visit (INDEPENDENT_AMBULATORY_CARE_PROVIDER_SITE_OTHER): Payer: Medicare Other | Admitting: Cardiothoracic Surgery

## 2017-02-17 VITALS — BP 148/97 | HR 67 | Temp 97.7°F | Ht 64.0 in | Wt 131.6 lb

## 2017-02-17 DIAGNOSIS — C342 Malignant neoplasm of middle lobe, bronchus or lung: Secondary | ICD-10-CM

## 2017-02-17 NOTE — Patient Instructions (Signed)
We will not need to see you back in office.   If you need to be seen back in our office please give [place] a call.

## 2017-02-20 ENCOUNTER — Inpatient Hospital Stay: Payer: Medicare Other

## 2017-02-20 VITALS — BP 128/73

## 2017-02-20 DIAGNOSIS — Z510 Encounter for antineoplastic radiation therapy: Secondary | ICD-10-CM | POA: Diagnosis not present

## 2017-02-20 DIAGNOSIS — C342 Malignant neoplasm of middle lobe, bronchus or lung: Secondary | ICD-10-CM

## 2017-02-20 DIAGNOSIS — Z5111 Encounter for antineoplastic chemotherapy: Secondary | ICD-10-CM | POA: Diagnosis not present

## 2017-02-20 LAB — COMPREHENSIVE METABOLIC PANEL
ALT: 30 U/L (ref 14–54)
ANION GAP: 11 (ref 5–15)
AST: 31 U/L (ref 15–41)
Albumin: 3.8 g/dL (ref 3.5–5.0)
Alkaline Phosphatase: 68 U/L (ref 38–126)
BUN: 19 mg/dL (ref 6–20)
CHLORIDE: 100 mmol/L — AB (ref 101–111)
CO2: 24 mmol/L (ref 22–32)
CREATININE: 0.84 mg/dL (ref 0.44–1.00)
Calcium: 9.3 mg/dL (ref 8.9–10.3)
GFR calc non Af Amer: >60 mL/min (ref >60)
Glucose, Bld: 88 mg/dL (ref 65–99)
POTASSIUM: 4 mmol/L (ref 3.5–5.1)
SODIUM: 135 mmol/L (ref 135–145)
Total Bilirubin: 0.5 mg/dL (ref 0.3–1.2)
Total Protein: 7 g/dL (ref 6.5–8.1)

## 2017-02-20 LAB — CBC WITH DIFFERENTIAL/PLATELET
BASOS ABS: 0 10*3/uL (ref 0–0.1)
Basophils Relative: 1 %
EOS ABS: 0 10*3/uL (ref 0–0.7)
EOS PCT: 0 %
HCT: 35.9 % (ref 35.0–47.0)
Hemoglobin: 12.5 g/dL (ref 12.0–16.0)
LYMPHS ABS: 0.9 10*3/uL — AB (ref 1.0–3.6)
Lymphocytes Relative: 13 %
MCH: 31.2 pg (ref 26.0–34.0)
MCHC: 34.8 g/dL (ref 32.0–36.0)
MCV: 89.4 fL (ref 80.0–100.0)
Monocytes Absolute: 0.3 10*3/uL (ref 0.2–0.9)
Monocytes Relative: 5 %
Neutro Abs: 5.8 10*3/uL (ref 1.4–6.5)
Neutrophils Relative %: 81 %
PLATELETS: 348 10*3/uL (ref 150–440)
RBC: 4.01 MIL/uL (ref 3.80–5.20)
RDW: 13.4 % (ref 11.5–14.5)
WBC: 7.1 10*3/uL (ref 3.6–11.0)

## 2017-02-20 MED ORDER — FAMOTIDINE IN NACL 20-0.9 MG/50ML-% IV SOLN
20.0000 mg | Freq: Once | INTRAVENOUS | Status: AC
  Administered 2017-02-20: 20 mg via INTRAVENOUS
  Filled 2017-02-20: qty 50

## 2017-02-20 MED ORDER — HEPARIN SOD (PORK) LOCK FLUSH 100 UNIT/ML IV SOLN
500.0000 [IU] | Freq: Once | INTRAVENOUS | Status: AC | PRN
  Administered 2017-02-20: 500 [IU]

## 2017-02-20 MED ORDER — CARBOPLATIN CHEMO INJECTION 450 MG/45ML
150.0000 mg | Freq: Once | INTRAVENOUS | Status: AC
  Administered 2017-02-20: 150 mg via INTRAVENOUS
  Filled 2017-02-20: qty 15

## 2017-02-20 MED ORDER — PALONOSETRON HCL INJECTION 0.25 MG/5ML
0.2500 mg | Freq: Once | INTRAVENOUS | Status: AC
  Administered 2017-02-20: 0.25 mg via INTRAVENOUS
  Filled 2017-02-20: qty 5

## 2017-02-20 MED ORDER — DIPHENHYDRAMINE HCL 50 MG/ML IJ SOLN
50.0000 mg | Freq: Once | INTRAMUSCULAR | Status: AC
  Administered 2017-02-20: 50 mg via INTRAVENOUS
  Filled 2017-02-20: qty 1

## 2017-02-20 MED ORDER — HEPARIN SOD (PORK) LOCK FLUSH 100 UNIT/ML IV SOLN
INTRAVENOUS | Status: AC
  Filled 2017-02-20: qty 5

## 2017-02-20 MED ORDER — SODIUM CHLORIDE 0.9 % IV SOLN
20.0000 mg | Freq: Once | INTRAVENOUS | Status: AC
  Administered 2017-02-20: 20 mg via INTRAVENOUS
  Filled 2017-02-20: qty 2

## 2017-02-20 MED ORDER — PACLITAXEL CHEMO INJECTION 300 MG/50ML
45.0000 mg/m2 | Freq: Once | INTRAVENOUS | Status: AC
  Administered 2017-02-20: 72 mg via INTRAVENOUS
  Filled 2017-02-20: qty 12

## 2017-02-20 MED ORDER — SODIUM CHLORIDE 0.9 % IV SOLN
Freq: Once | INTRAVENOUS | Status: AC
  Administered 2017-02-20: 10:00:00 via INTRAVENOUS
  Filled 2017-02-20: qty 1000

## 2017-02-23 ENCOUNTER — Ambulatory Visit
Admission: RE | Admit: 2017-02-23 | Discharge: 2017-02-23 | Disposition: A | Discharge status: Home / Self Care | Payer: Medicare Other | Source: Ambulatory Visit | Attending: Radiation Oncology | Admitting: Radiation Oncology

## 2017-02-27 ENCOUNTER — Inpatient Hospital Stay: Payer: Medicare Other

## 2017-02-27 ENCOUNTER — Ambulatory Visit
Admission: RE | Admit: 2017-02-27 | Discharge: 2017-02-27 | Disposition: A | Discharge status: Home / Self Care | Payer: Medicare Other | Source: Ambulatory Visit | Attending: Radiation Oncology | Admitting: Radiation Oncology

## 2017-02-27 ENCOUNTER — Ambulatory Visit: Payer: Medicare Other

## 2017-02-27 ENCOUNTER — Inpatient Hospital Stay (HOSPITAL_BASED_OUTPATIENT_CLINIC_OR_DEPARTMENT_OTHER): Payer: Medicare Other | Admitting: Internal Medicine

## 2017-02-27 VITALS — BP 134/78 | HR 56 | Temp 97.7°F | Resp 16 | Wt 130.8 lb

## 2017-02-27 DIAGNOSIS — C342 Malignant neoplasm of middle lobe, bronchus or lung: Secondary | ICD-10-CM | POA: Diagnosis not present

## 2017-02-27 DIAGNOSIS — Z5111 Encounter for antineoplastic chemotherapy: Secondary | ICD-10-CM | POA: Diagnosis not present

## 2017-02-27 DIAGNOSIS — Z510 Encounter for antineoplastic radiation therapy: Secondary | ICD-10-CM | POA: Diagnosis not present

## 2017-02-27 DIAGNOSIS — Z79899 Other long term (current) drug therapy: Secondary | ICD-10-CM | POA: Diagnosis not present

## 2017-02-27 LAB — COMPREHENSIVE METABOLIC PANEL
ALBUMIN: 3.8 g/dL (ref 3.5–5.0)
ALK PHOS: 81 U/L (ref 38–126)
ALT: 31 U/L (ref 14–54)
ANION GAP: 6 (ref 5–15)
AST: 32 U/L (ref 15–41)
BILIRUBIN TOTAL: 0.5 mg/dL (ref 0.3–1.2)
BUN: 19 mg/dL (ref 6–20)
CO2: 27 mmol/L (ref 22–32)
Calcium: 8.7 mg/dL — ABNORMAL LOW (ref 8.9–10.3)
Chloride: 102 mmol/L (ref 101–111)
Creatinine, Ser: 0.64 mg/dL (ref 0.44–1.00)
Glucose, Bld: 93 mg/dL (ref 65–99)
POTASSIUM: 3.9 mmol/L (ref 3.5–5.1)
Sodium: 135 mmol/L (ref 135–145)
TOTAL PROTEIN: 6.8 g/dL (ref 6.5–8.1)

## 2017-02-27 LAB — CBC WITH DIFFERENTIAL/PLATELET
BASOS PCT: 1 %
Basophils Absolute: 0 10*3/uL (ref 0–0.1)
Eosinophils Absolute: 0 10*3/uL (ref 0–0.7)
Eosinophils Relative: 1 %
HEMATOCRIT: 34.2 % — AB (ref 35.0–47.0)
Hemoglobin: 11.9 g/dL — ABNORMAL LOW (ref 12.0–16.0)
LYMPHS ABS: 0.8 10*3/uL — AB (ref 1.0–3.6)
Lymphocytes Relative: 25 %
MCH: 30.9 pg (ref 26.0–34.0)
MCHC: 34.8 g/dL (ref 32.0–36.0)
MCV: 88.7 fL (ref 80.0–100.0)
MONO ABS: 0.4 10*3/uL (ref 0.2–0.9)
MONOS PCT: 10 %
NEUTROS ABS: 2.2 10*3/uL (ref 1.4–6.5)
Neutrophils Relative %: 63 %
Platelets: 287 10*3/uL (ref 150–440)
RBC: 3.86 MIL/uL (ref 3.80–5.20)
RDW: 13.4 % (ref 11.5–14.5)
WBC: 3.4 10*3/uL — ABNORMAL LOW (ref 3.6–11.0)

## 2017-02-27 MED ORDER — HEPARIN SOD (PORK) LOCK FLUSH 100 UNIT/ML IV SOLN: 500.0000 [IU] | Freq: Once | INTRAVENOUS | Status: DC | PRN

## 2017-02-27 MED ORDER — SODIUM CHLORIDE 0.9% FLUSH
10.0000 mL | INTRAVENOUS | Status: DC | PRN
  Filled 2017-02-27: qty 10

## 2017-02-27 MED ORDER — DIPHENHYDRAMINE HCL 50 MG/ML IJ SOLN
50.0000 mg | Freq: Once | INTRAMUSCULAR | Status: AC
  Administered 2017-02-27: 50 mg via INTRAVENOUS
  Filled 2017-02-27: qty 1

## 2017-02-27 MED ORDER — PALONOSETRON HCL INJECTION 0.25 MG/5ML
0.2500 mg | Freq: Once | INTRAVENOUS | Status: AC
  Administered 2017-02-27: 0.25 mg via INTRAVENOUS
  Filled 2017-02-27: qty 5

## 2017-02-27 MED ORDER — SODIUM CHLORIDE 0.9 % IV SOLN
152.4000 mg | Freq: Once | INTRAVENOUS | Status: AC
  Administered 2017-02-27: 150 mg via INTRAVENOUS
  Filled 2017-02-27: qty 15

## 2017-02-27 MED ORDER — SODIUM CHLORIDE 0.9 % IV SOLN
20.0000 mg | Freq: Once | INTRAVENOUS | Status: AC
  Administered 2017-02-27: 20 mg via INTRAVENOUS
  Filled 2017-02-27: qty 2

## 2017-02-27 MED ORDER — HEPARIN SOD (PORK) LOCK FLUSH 100 UNIT/ML IV SOLN
500.0000 [IU] | Freq: Once | INTRAVENOUS | Status: AC
  Administered 2017-02-27: 500 [IU] via INTRAVENOUS
  Filled 2017-02-27: qty 5

## 2017-02-27 MED ORDER — SODIUM CHLORIDE 0.9% FLUSH
10.0000 mL | INTRAVENOUS | Status: DC | PRN
  Administered 2017-02-27 (×2): 10 mL via INTRAVENOUS
  Filled 2017-02-27: qty 10

## 2017-02-27 MED ORDER — FAMOTIDINE IN NACL 20-0.9 MG/50ML-% IV SOLN
20.0000 mg | Freq: Once | INTRAVENOUS | Status: AC
  Administered 2017-02-27: 20 mg via INTRAVENOUS
  Filled 2017-02-27: qty 50

## 2017-02-27 MED ORDER — SODIUM CHLORIDE 0.9 % IV SOLN
Freq: Once | INTRAVENOUS | Status: AC
  Administered 2017-02-27: 10:00:00 via INTRAVENOUS
  Filled 2017-02-27: qty 1000

## 2017-02-27 MED ORDER — DEXTROSE 5 % IV SOLN
45.0000 mg/m2 | Freq: Once | INTRAVENOUS | Status: AC
  Administered 2017-02-27: 72 mg via INTRAVENOUS
  Filled 2017-02-27: qty 12

## 2017-02-27 NOTE — Progress Notes (Signed)
Patient is here for a follow up today. Patient reports no new concerns today.

## 2017-02-27 NOTE — Assessment & Plan Note (Addendum)
#  Recurrent STAGE III lung ca [EGFR mutated; positive for adenocarcinoma of the right paratracheal lymph node]. July 2018- s/p EBUS of right paratrach LN- POSITIVE for Recurrence- Adeno Ca. PET- No evidence of distant metastatic disease.   # Local recurrence recommend concurrent chemoradiation- carbotaxol weekly s/p 2 cycles;tolearting well. Starting radiation today. Discussed re: using oral TKI like alectinib. However, pt will need to get CT scan prior to starting systemic TKI.   # follow up in 2 weeks/chemo-MD/ weekly-labs [labor day]-chemo.

## 2017-02-27 NOTE — Progress Notes (Signed)
[facility] OFFICE PROGRESS NOTE  Patient Care Team: Sparks, Jeffrey D, MD as PCP - General (Internal Medicine)  Cancer Staging No matching staging information was found for the patient.   Oncology History   # July-AUG 2018- RECURRENT Adeno Ca [EBUS; Right paratrach LN [Dr.Ram & Dr.Oaks]insuff tissue for testing; check liquid Bx]; PET- no distant mets.  # AUG 13th 2018- Carbo-Taxol with RT  ------------------------------------------------------------------------------   # MAY 2015- ..A right middle lobe adenocarcinoma s/p resection [Dr.Oaks].  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  may of 2015 4.Patient has finished 4 cycles of chemotherapy on January 23, 2014 Now patient would be randomized   to  our [facility] protocol for Tarceva vs. placebo because of EGFR mutation 3.patient desires to get off protocol because of progressive side effect even after reducing dose.  (May 13, 2014) 5.Patient is starting  maintenance investigationally approach with Tarceva vs. placebo September of 2015. 6.Patient came off protocol therapy because of significant GI side effect May 13, 2014   Patient is nonsmoker;        Primary cancer of right middle lobe of lung ([facility])     INTERVAL HISTORY:  Lauren Page 66 y.o.  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  Patient completes of mild fatigue post chemotherapy; otherwise denies any worsening cough or significant cough. Denies any weight loss. Denies any headaches. No chest pain. She denies any headaches.  REVIEW OF SYSTEMS:  A complete 10 point review of system is done which is negative except mentioned above/history of present illness.   PAST MEDICAL HISTORY :  Past  Medical History:  Diagnosis Date  . Acid reflux   . Anemia    HAD TO HAVE IRON INFUSIONS BACK IN 2015  . Arthritis    bil hands and back  . Asthma   . Cancer of right lung ([facility]) 2015   lung cancer - chemo, Dr Oaks removed RML Lobectomy.   . Dyspnea    WITH EXERTION  . Hypertension   . Mitral valve prolapse     PAST SURGICAL HISTORY :   Past Surgical History:  Procedure Laterality Date  . ABDOMINAL HYSTERECTOMY    . BREAST BIOPSY Left 1994 (-), 2004 (-)  . COLONOSCOPY  2008    Dr. Skulski  . KNEE SURGERY  2007  . LUNG LOBECTOMY Right    middle lobe  . PORTACATH PLACEMENT Right 02/09/2017   Procedure: INSERTION PORT-A-CATH;  Surgeon: Oaks, Timothy, MD;  Location: [facility] ORS;  Service: General;  Laterality: Right;  . SALIVARY GLAND SURGERY Left 1983   with lymph node removal also  . UPPER GI ENDOSCOPY  2008  . VIDEO BRONCHOSCOPY WITH ENDOBRONCHIAL ULTRASOUND Right 01/24/2017   Procedure: VIDEO BRONCHOSCOPY WITH ENDOBRONCHIAL ULTRASOUND;  Surgeon: Ramachandran, Pradeep, MD;  Location: [facility] ORS;  Service: Pulmonary;  Laterality: Right;    FAMILY HISTORY :   Family History  Problem Relation Age of Onset  . Breast cancer Maternal Aunt   . Breast cancer Paternal Aunt   . Breast cancer Maternal Grandmother   . Breast cancer Maternal Aunt   . Breast cancer Paternal Aunt   . Healthy Mother   . Healthy Father     SOCIAL HISTORY:   Social History  Substance Use   Topics  . Smoking status: Never Smoker  . Smokeless tobacco: Never Used  . Alcohol use 4.2 oz/week    7 Glasses of wine per week    ALLERGIES:  is allergic to penicillins and sulfa antibiotics.  MEDICATIONS:  Current Outpatient Prescriptions  Medication Sig Dispense Refill  . albuterol (PROVENTIL HFA;VENTOLIN HFA) 108 (90 Base) MCG/ACT inhaler Inhale 1-2 puffs into the lungs every 6 (six) hours as needed for wheezing or shortness of breath.    . B Complex Vitamins (VITAMIN B COMPLEX PO) Take 1 tablet by mouth daily.      . Biotin w/ Vitamins C & E (HAIR/SKIN/NAILS PO) Take 1 tablet by mouth daily.    . Calcium Carb-Cholecalciferol (CALCIUM 600+D) 600-800 MG-UNIT TABS Take 2 tablets by mouth daily.    . Cholecalciferol (D3 MAXIMUM STRENGTH) 5000 units capsule Take 5,000 Units by mouth daily.    . COLLAGEN PO Take 1,000 mg by mouth daily.    . cyclobenzaprine (FLEXERIL) 5 MG tablet Take 5 mg by mouth 3 (three) times daily as needed for muscle spasms.    . fexofenadine (ALLEGRA) 180 MG tablet Take 180 mg by mouth daily as needed for allergies.     . fluticasone (FLONASE) 50 MCG/ACT nasal spray Place 2 sprays into both nostrils daily as needed for allergies or rhinitis.    . Fluticasone-Salmeterol (ADVAIR) 250-50 MCG/DOSE AEPB Inhale 1 puff into the lungs 2 (two) times daily as needed (for respiratory issues.).    Marland Kitchen lansoprazole (PREVACID) 30 MG capsule Take 30 mg by mouth every morning.     . lidocaine-prilocaine (EMLA) cream Apply 1 application topically as needed. Apply generously over the Mediport 45 minutes prior to chemotherapy. 30 g 0  . losartan (COZAAR) 25 MG tablet Take 25 mg by mouth at bedtime.     . metoCLOPramide (REGLAN) 10 MG tablet Take 1 tablet (10 mg total) by mouth every 8 (eight) hours as needed for nausea. 40 tablet 3  . ondansetron (ZOFRAN) 8 MG tablet Take 1 tablet (8 mg total) by mouth every 8 (eight) hours as needed for nausea or vomiting (start 3 days; after chemo). 40 tablet 1  . prochlorperazine (COMPAZINE) 10 MG tablet Take 1 tablet (10 mg total) by mouth every 6 (six) hours as needed for nausea or vomiting. 40 tablet 1  . traZODone (DESYREL) 50 MG tablet Take 1 tablet by mouth 1 day or 1 dose.     No current facility-administered medications for this visit.    Facility-Administered Medications Ordered in Other Visits  Medication Dose Route Frequency Provider Last Rate Last Dose  . heparin lock flush 100 unit/mL  500 Units Intracatheter Once PRN Charlaine Dalton R, MD      .  sodium chloride flush (NS) 0.9 % injection 10 mL  10 mL Intravenous PRN Cammie Sickle, MD   10 mL at 02/27/17 1026  . sodium chloride flush (NS) 0.9 % injection 10 mL  10 mL Intracatheter PRN Cammie Sickle, MD        PHYSICAL EXAMINATION: ECOG PERFORMANCE STATUS: 0 - Asymptomatic  BP 134/78 (BP Location: Left Arm, Patient Position: Sitting)   Pulse (!) 56   Temp 97.7 F (36.5 C) (Tympanic)   Resp 16   Wt 130 lb 12.8 oz (59.3 kg)   BMI 22.45 kg/m   Filed Weights   02/27/17 0937  Weight: 130 lb 12.8 oz (59.3 kg)    GENERAL: Well-nourished well-developed; Alert, no distress and comfortable.  Accompanied by family. EYES: no pallor or icterus OROPHARYNX: no thrush or ulceration; good dentition  NECK: supple, no masses felt LYMPH:  no palpable lymphadenopathy in the cervical, axillary or inguinal regions LUNGS: clear to auscultation and  No wheeze or crackles HEART/CVS: regular rate & rhythm and no murmurs; No lower extremity edema ABDOMEN:abdomen soft, non-tender and normal bowel sounds Musculoskeletal:no cyanosis of digits and no clubbing  PSYCH: alert & oriented x 3 with fluent speech NEURO: no focal motor/sensory deficits SKIN:  no rashes or significant lesions  LABORATORY DATA:  I have reviewed the data as listed    Component Value Date/Time   NA 135 02/27/2017 0925   NA 141 06/03/2014 1039   K 3.9 02/27/2017 0925   K 4.1 06/03/2014 1039   CL 102 02/27/2017 0925   CL 105 06/03/2014 1039   CO2 27 02/27/2017 0925   CO2 28 06/03/2014 1039   GLUCOSE 93 02/27/2017 0925   GLUCOSE 88 06/03/2014 1039   BUN 19 02/27/2017 0925   BUN 14 06/03/2014 1039   CREATININE 0.64 02/27/2017 0925   CREATININE 0.68 06/03/2014 1039   CALCIUM 8.7 (L) 02/27/2017 0925   CALCIUM 8.7 06/03/2014 1039   PROT 6.8 02/27/2017 0925   PROT 7.6 06/03/2014 1039   ALBUMIN 3.8 02/27/2017 0925   ALBUMIN 3.7 06/03/2014 1039   AST 32 02/27/2017 0925   AST 36 06/03/2014 1039   ALT 31  02/27/2017 0925   ALT 34 06/03/2014 1039   ALKPHOS 81 02/27/2017 0925   ALKPHOS 86 06/03/2014 1039   BILITOT 0.5 02/27/2017 0925   BILITOT 0.4 06/03/2014 1039   GFRNONAA >60 02/27/2017 0925   GFRNONAA >60 06/03/2014 1039   GFRNONAA >60 03/20/2014 1109   GFRAA >60 02/27/2017 0925   GFRAA >60 06/03/2014 1039   GFRAA >60 03/20/2014 1109    No results found for: SPEP, UPEP  Lab Results  Component Value Date   WBC 3.4 (L) 02/27/2017   NEUTROABS 2.2 02/27/2017   HGB 11.9 (L) 02/27/2017   HCT 34.2 (L) 02/27/2017   MCV 88.7 02/27/2017   PLT 287 02/27/2017      Chemistry      Component Value Date/Time   NA 135 02/27/2017 0925   NA 141 06/03/2014 1039   K 3.9 02/27/2017 0925   K 4.1 06/03/2014 1039   CL 102 02/27/2017 0925   CL 105 06/03/2014 1039   CO2 27 02/27/2017 0925   CO2 28 06/03/2014 1039   BUN 19 02/27/2017 0925   BUN 14 06/03/2014 1039   CREATININE 0.64 02/27/2017 0925   CREATININE 0.68 06/03/2014 1039      Component Value Date/Time   CALCIUM 8.7 (L) 02/27/2017 0925   CALCIUM 8.7 06/03/2014 1039   ALKPHOS 81 02/27/2017 0925   ALKPHOS 86 06/03/2014 1039   AST 32 02/27/2017 0925   AST 36 06/03/2014 1039   ALT 31 02/27/2017 0925   ALT 34 06/03/2014 1039   BILITOT 0.5 02/27/2017 0925   BILITOT 0.4 06/03/2014 1039     IMPRESSION: 1. Interval increase in size of bilateral hilar lymph nodes. The right hilar node now measures 1.6 cm. 2. Stable index nodule within the superior segment of right lower lobe. Similar appearance of subpleural nodularity overlying the right lung and extending along the major fissure. 3.  Aortic Atherosclerosis (ICD10-I70.0).   Electronically Signed   By: Taylor  Stroud M.D.   On: 12/16/2016 13:18  RADIOGRAPHIC STUDIES: I have personally reviewed the radiological images as   listed and agreed with the findings in the report. No results found.   ASSESSMENT & PLAN:  Primary cancer of right middle lobe of lung ([facility]) # Recurrent  STAGE III lung ca [EGFR mutated; positive for adenocarcinoma of the right paratracheal lymph node]. July 2018- s/p EBUS of right paratrach LN- POSITIVE for Recurrence- Adeno Ca. PET- No evidence of distant metastatic disease.   # Local recurrence recommend concurrent chemoradiation- carbotaxol weekly s/p 2 cycles;tolearting well. Starting radiation today. Discussed re: using oral TKI like alectinib. However, pt will need to get CT scan prior to starting systemic TKI.   # follow up in 2 weeks/chemo-MD/ weekly-labs [labor day]-chemo.   Orders Placed This Encounter  Procedures  . Basic metabolic panel    Standing Status:   Future    Standing Expiration Date:   02/27/2018   All questions were answered. The patient knows to call the clinic with any problems, questions or concerns.       R , MD 02/27/2017 1:47 PM 

## 2017-02-28 ENCOUNTER — Ambulatory Visit
Admission: RE | Admit: 2017-02-28 | Discharge: 2017-02-28 | Disposition: A | Discharge status: Home / Self Care | Payer: Medicare Other | Source: Ambulatory Visit | Attending: Radiation Oncology | Admitting: Radiation Oncology

## 2017-02-28 ENCOUNTER — Ambulatory Visit: Payer: Medicare Other

## 2017-02-28 DIAGNOSIS — Z510 Encounter for antineoplastic radiation therapy: Secondary | ICD-10-CM | POA: Diagnosis not present

## 2017-03-01 ENCOUNTER — Ambulatory Visit: Payer: Medicare Other

## 2017-03-01 ENCOUNTER — Ambulatory Visit
Admission: RE | Admit: 2017-03-01 | Discharge: 2017-03-01 | Disposition: A | Discharge status: Home / Self Care | Payer: Medicare Other | Source: Ambulatory Visit | Attending: Radiation Oncology | Admitting: Radiation Oncology

## 2017-03-01 DIAGNOSIS — Z510 Encounter for antineoplastic radiation therapy: Secondary | ICD-10-CM | POA: Diagnosis not present

## 2017-03-02 ENCOUNTER — Ambulatory Visit
Admission: RE | Admit: 2017-03-02 | Discharge: 2017-03-02 | Disposition: A | Discharge status: Home / Self Care | Payer: Medicare Other | Source: Ambulatory Visit | Attending: Radiation Oncology | Admitting: Radiation Oncology

## 2017-03-02 ENCOUNTER — Ambulatory Visit: Payer: Medicare Other

## 2017-03-02 DIAGNOSIS — Z510 Encounter for antineoplastic radiation therapy: Secondary | ICD-10-CM | POA: Diagnosis not present

## 2017-03-02 NOTE — Progress Notes (Signed)
She returns today in follow-up. She tolerated her port placed without difficulty. Her wounds are clean and dry. He did remove her single stitch in her neck. The rest of the wounds look clean and dry and without erythema. We did not make a return visit for but would be happy to see her should the need arise.

## 2017-03-03 ENCOUNTER — Ambulatory Visit
Admission: RE | Admit: 2017-03-03 | Discharge: 2017-03-03 | Disposition: A | Discharge status: Home / Self Care | Payer: Medicare Other | Source: Ambulatory Visit | Attending: Radiation Oncology | Admitting: Radiation Oncology

## 2017-03-03 ENCOUNTER — Ambulatory Visit: Payer: Medicare Other

## 2017-03-03 DIAGNOSIS — Z510 Encounter for antineoplastic radiation therapy: Secondary | ICD-10-CM | POA: Diagnosis not present

## 2017-03-07 ENCOUNTER — Other Ambulatory Visit: Payer: Self-pay | Admitting: Internal Medicine

## 2017-03-07 ENCOUNTER — Inpatient Hospital Stay: Payer: Medicare Other | Attending: Internal Medicine

## 2017-03-07 ENCOUNTER — Ambulatory Visit: Payer: Medicare Other

## 2017-03-07 ENCOUNTER — Ambulatory Visit
Admission: RE | Admit: 2017-03-07 | Discharge: 2017-03-07 | Disposition: A | Discharge status: Home / Self Care | Payer: Medicare Other | Source: Ambulatory Visit | Attending: Radiation Oncology | Admitting: Radiation Oncology

## 2017-03-07 ENCOUNTER — Inpatient Hospital Stay: Payer: Medicare Other

## 2017-03-07 VITALS — BP 132/76 | HR 60 | Temp 97.7°F | Resp 18

## 2017-03-07 DIAGNOSIS — I341 Nonrheumatic mitral (valve) prolapse: Secondary | ICD-10-CM | POA: Diagnosis not present

## 2017-03-07 DIAGNOSIS — K5900 Constipation, unspecified: Secondary | ICD-10-CM | POA: Insufficient documentation

## 2017-03-07 DIAGNOSIS — C342 Malignant neoplasm of middle lobe, bronchus or lung: Secondary | ICD-10-CM | POA: Diagnosis not present

## 2017-03-07 DIAGNOSIS — Z803 Family history of malignant neoplasm of breast: Secondary | ICD-10-CM | POA: Insufficient documentation

## 2017-03-07 DIAGNOSIS — Z5111 Encounter for antineoplastic chemotherapy: Secondary | ICD-10-CM | POA: Insufficient documentation

## 2017-03-07 DIAGNOSIS — I10 Essential (primary) hypertension: Secondary | ICD-10-CM | POA: Diagnosis not present

## 2017-03-07 DIAGNOSIS — K219 Gastro-esophageal reflux disease without esophagitis: Secondary | ICD-10-CM | POA: Insufficient documentation

## 2017-03-07 DIAGNOSIS — J45909 Unspecified asthma, uncomplicated: Secondary | ICD-10-CM | POA: Diagnosis not present

## 2017-03-07 DIAGNOSIS — Z79899 Other long term (current) drug therapy: Secondary | ICD-10-CM | POA: Insufficient documentation

## 2017-03-07 DIAGNOSIS — M1990 Unspecified osteoarthritis, unspecified site: Secondary | ICD-10-CM | POA: Insufficient documentation

## 2017-03-07 DIAGNOSIS — R140 Abdominal distension (gaseous): Secondary | ICD-10-CM | POA: Insufficient documentation

## 2017-03-07 DIAGNOSIS — Z510 Encounter for antineoplastic radiation therapy: Secondary | ICD-10-CM | POA: Diagnosis not present

## 2017-03-07 DIAGNOSIS — I700 Atherosclerosis of aorta: Secondary | ICD-10-CM | POA: Insufficient documentation

## 2017-03-07 LAB — CBC WITH DIFFERENTIAL/PLATELET
BASOS PCT: 1 %
Basophils Absolute: 0 10*3/uL (ref 0–0.1)
EOS ABS: 0 10*3/uL (ref 0–0.7)
EOS PCT: 1 %
HCT: 32.5 % — ABNORMAL LOW (ref 35.0–47.0)
Hemoglobin: 11.4 g/dL — ABNORMAL LOW (ref 12.0–16.0)
Lymphocytes Relative: 27 %
Lymphs Abs: 0.6 10*3/uL — ABNORMAL LOW (ref 1.0–3.6)
MCH: 31.5 pg (ref 26.0–34.0)
MCHC: 35.2 g/dL (ref 32.0–36.0)
MCV: 89.4 fL (ref 80.0–100.0)
MONO ABS: 0.3 10*3/uL (ref 0.2–0.9)
MONOS PCT: 12 %
Neutro Abs: 1.3 10*3/uL — ABNORMAL LOW (ref 1.4–6.5)
Neutrophils Relative %: 59 %
Platelets: 227 10*3/uL (ref 150–440)
RBC: 3.63 MIL/uL — ABNORMAL LOW (ref 3.80–5.20)
RDW: 14 % (ref 11.5–14.5)
WBC: 2.2 10*3/uL — ABNORMAL LOW (ref 3.6–11.0)

## 2017-03-07 LAB — BASIC METABOLIC PANEL
Anion gap: 6 (ref 5–15)
BUN: 18 mg/dL (ref 6–20)
CALCIUM: 8.8 mg/dL — AB (ref 8.9–10.3)
CO2: 27 mmol/L (ref 22–32)
Chloride: 103 mmol/L (ref 101–111)
Creatinine, Ser: 0.64 mg/dL (ref 0.44–1.00)
GFR calc non Af Amer: >60 mL/min (ref >60)
Glucose, Bld: 92 mg/dL (ref 65–99)
Potassium: 3.8 mmol/L (ref 3.5–5.1)
SODIUM: 136 mmol/L (ref 135–145)

## 2017-03-07 MED ORDER — DIPHENHYDRAMINE HCL 50 MG/ML IJ SOLN
50.0000 mg | Freq: Once | INTRAMUSCULAR | Status: DC
  Filled 2017-03-07: qty 1

## 2017-03-07 MED ORDER — PACLITAXEL CHEMO INJECTION 300 MG/50ML
45.0000 mg/m2 | Freq: Once | INTRAVENOUS | Status: AC
  Administered 2017-03-07: 72 mg via INTRAVENOUS
  Filled 2017-03-07: qty 12

## 2017-03-07 MED ORDER — DIPHENHYDRAMINE HCL 50 MG/ML IJ SOLN
25.0000 mg | Freq: Once | INTRAMUSCULAR | Status: AC
  Administered 2017-03-07: 25 mg via INTRAVENOUS

## 2017-03-07 MED ORDER — HEPARIN SOD (PORK) LOCK FLUSH 100 UNIT/ML IV SOLN
500.0000 [IU] | Freq: Once | INTRAVENOUS | Status: AC | PRN
  Administered 2017-03-07: 500 [IU]
  Filled 2017-03-07: qty 5

## 2017-03-07 MED ORDER — DEXAMETHASONE SODIUM PHOSPHATE 100 MG/10ML IJ SOLN
20.0000 mg | Freq: Once | INTRAMUSCULAR | Status: AC
  Administered 2017-03-07: 20 mg via INTRAVENOUS
  Filled 2017-03-07: qty 2

## 2017-03-07 MED ORDER — SODIUM CHLORIDE 0.9% FLUSH
10.0000 mL | INTRAVENOUS | Status: DC | PRN
  Administered 2017-03-07: 10 mL
  Filled 2017-03-07: qty 10

## 2017-03-07 MED ORDER — SODIUM CHLORIDE 0.9 % IV SOLN
152.4000 mg | Freq: Once | INTRAVENOUS | Status: AC
  Administered 2017-03-07: 150 mg via INTRAVENOUS
  Filled 2017-03-07: qty 15

## 2017-03-07 MED ORDER — PALONOSETRON HCL INJECTION 0.25 MG/5ML
0.2500 mg | Freq: Once | INTRAVENOUS | Status: AC
  Administered 2017-03-07: 0.25 mg via INTRAVENOUS
  Filled 2017-03-07: qty 5

## 2017-03-07 MED ORDER — FAMOTIDINE IN NACL 20-0.9 MG/50ML-% IV SOLN
20.0000 mg | Freq: Once | INTRAVENOUS | Status: AC
  Administered 2017-03-07: 20 mg via INTRAVENOUS
  Filled 2017-03-07: qty 50

## 2017-03-07 MED ORDER — SODIUM CHLORIDE 0.9 % IV SOLN
Freq: Once | INTRAVENOUS | Status: AC
  Administered 2017-03-07: 11:00:00 via INTRAVENOUS
  Filled 2017-03-07: qty 1000

## 2017-03-07 NOTE — Progress Notes (Signed)
ANC: 1300. MD, Dr. Rogue Bussing, notified and labs reviewed with MD via telephone. Per MD order: proceed with scheduled treatment today.  1119- Patient states, "The Benadryl really bothered me last time. I was very drowsy and couldn't focus. I normally only take 25mg  of Benadryl at home." Benadryl 50mg  IV once currently ordered. MD, Dr. Rogue Bussing, notified via telephone. Per MD order: discontinue order for Benadryl 50mg  IV once. Place order for Benadryl 25mg  IV once.

## 2017-03-08 ENCOUNTER — Ambulatory Visit
Admission: RE | Admit: 2017-03-08 | Discharge: 2017-03-08 | Disposition: A | Discharge status: Home / Self Care | Payer: Medicare Other | Source: Ambulatory Visit | Attending: Radiation Oncology | Admitting: Radiation Oncology

## 2017-03-08 ENCOUNTER — Ambulatory Visit: Payer: Medicare Other

## 2017-03-08 DIAGNOSIS — Z510 Encounter for antineoplastic radiation therapy: Secondary | ICD-10-CM | POA: Diagnosis not present

## 2017-03-09 ENCOUNTER — Ambulatory Visit
Admission: RE | Admit: 2017-03-09 | Discharge: 2017-03-09 | Disposition: A | Discharge status: Home / Self Care | Payer: Medicare Other | Source: Ambulatory Visit | Attending: Radiation Oncology | Admitting: Radiation Oncology

## 2017-03-09 ENCOUNTER — Ambulatory Visit: Payer: Medicare Other

## 2017-03-09 DIAGNOSIS — Z510 Encounter for antineoplastic radiation therapy: Secondary | ICD-10-CM | POA: Diagnosis not present

## 2017-03-10 ENCOUNTER — Ambulatory Visit
Admission: RE | Admit: 2017-03-10 | Discharge: 2017-03-10 | Disposition: A | Discharge status: Home / Self Care | Payer: Medicare Other | Source: Ambulatory Visit | Attending: Radiation Oncology | Admitting: Radiation Oncology

## 2017-03-10 ENCOUNTER — Ambulatory Visit: Payer: Medicare Other

## 2017-03-10 DIAGNOSIS — Z510 Encounter for antineoplastic radiation therapy: Secondary | ICD-10-CM | POA: Diagnosis not present

## 2017-03-13 ENCOUNTER — Ambulatory Visit
Admission: RE | Admit: 2017-03-13 | Discharge: 2017-03-13 | Disposition: A | Discharge status: Home / Self Care | Payer: Medicare Other | Source: Ambulatory Visit | Attending: Radiation Oncology | Admitting: Radiation Oncology

## 2017-03-13 ENCOUNTER — Inpatient Hospital Stay: Payer: Medicare Other

## 2017-03-13 ENCOUNTER — Ambulatory Visit: Payer: Medicare Other

## 2017-03-13 ENCOUNTER — Inpatient Hospital Stay (HOSPITAL_BASED_OUTPATIENT_CLINIC_OR_DEPARTMENT_OTHER): Payer: Medicare Other | Admitting: Internal Medicine

## 2017-03-13 VITALS — BP 138/93 | HR 72 | Temp 98.6°F | Resp 16 | Wt 132.8 lb

## 2017-03-13 DIAGNOSIS — I700 Atherosclerosis of aorta: Secondary | ICD-10-CM

## 2017-03-13 DIAGNOSIS — Z803 Family history of malignant neoplasm of breast: Secondary | ICD-10-CM

## 2017-03-13 DIAGNOSIS — K219 Gastro-esophageal reflux disease without esophagitis: Secondary | ICD-10-CM

## 2017-03-13 DIAGNOSIS — Z5111 Encounter for antineoplastic chemotherapy: Secondary | ICD-10-CM | POA: Diagnosis not present

## 2017-03-13 DIAGNOSIS — I10 Essential (primary) hypertension: Secondary | ICD-10-CM

## 2017-03-13 DIAGNOSIS — Z510 Encounter for antineoplastic radiation therapy: Secondary | ICD-10-CM | POA: Diagnosis not present

## 2017-03-13 DIAGNOSIS — I341 Nonrheumatic mitral (valve) prolapse: Secondary | ICD-10-CM

## 2017-03-13 DIAGNOSIS — C342 Malignant neoplasm of middle lobe, bronchus or lung: Secondary | ICD-10-CM | POA: Diagnosis not present

## 2017-03-13 DIAGNOSIS — R140 Abdominal distension (gaseous): Secondary | ICD-10-CM | POA: Diagnosis not present

## 2017-03-13 DIAGNOSIS — J45909 Unspecified asthma, uncomplicated: Secondary | ICD-10-CM

## 2017-03-13 DIAGNOSIS — Z79899 Other long term (current) drug therapy: Secondary | ICD-10-CM

## 2017-03-13 DIAGNOSIS — M1990 Unspecified osteoarthritis, unspecified site: Secondary | ICD-10-CM

## 2017-03-13 LAB — COMPREHENSIVE METABOLIC PANEL
ALT: 33 U/L (ref 14–54)
AST: 33 U/L (ref 15–41)
Albumin: 3.6 g/dL (ref 3.5–5.0)
Alkaline Phosphatase: 64 U/L (ref 38–126)
Anion gap: 4 — ABNORMAL LOW (ref 5–15)
BILIRUBIN TOTAL: 0.8 mg/dL (ref 0.3–1.2)
BUN: 16 mg/dL (ref 6–20)
CHLORIDE: 104 mmol/L (ref 101–111)
CO2: 27 mmol/L (ref 22–32)
CREATININE: 0.7 mg/dL (ref 0.44–1.00)
Calcium: 8.7 mg/dL — ABNORMAL LOW (ref 8.9–10.3)
GFR calc Af Amer: >60 mL/min (ref >60)
Glucose, Bld: 86 mg/dL (ref 65–99)
Potassium: 4.1 mmol/L (ref 3.5–5.1)
Sodium: 135 mmol/L (ref 135–145)
Total Protein: 6.3 g/dL — ABNORMAL LOW (ref 6.5–8.1)

## 2017-03-13 LAB — CBC WITH DIFFERENTIAL/PLATELET
Basophils Absolute: 0 10*3/uL (ref 0–0.1)
Basophils Relative: 1 %
Eosinophils Absolute: 0 10*3/uL (ref 0–0.7)
Eosinophils Relative: 1 %
HEMATOCRIT: 32.6 % — AB (ref 35.0–47.0)
HEMOGLOBIN: 11.4 g/dL — AB (ref 12.0–16.0)
LYMPHS PCT: 27 %
Lymphs Abs: 0.5 10*3/uL — ABNORMAL LOW (ref 1.0–3.6)
MCH: 31.5 pg (ref 26.0–34.0)
MCHC: 35.1 g/dL (ref 32.0–36.0)
MCV: 89.8 fL (ref 80.0–100.0)
Monocytes Absolute: 0.2 10*3/uL (ref 0.2–0.9)
Monocytes Relative: 9 %
NEUTROS ABS: 1 10*3/uL — AB (ref 1.4–6.5)
Neutrophils Relative %: 62 %
PLATELETS: 179 10*3/uL (ref 150–440)
RBC: 3.63 MIL/uL — AB (ref 3.80–5.20)
RDW: 14.3 % (ref 11.5–14.5)
WBC: 1.7 10*3/uL — AB (ref 3.6–11.0)

## 2017-03-13 MED ORDER — HEPARIN SOD (PORK) LOCK FLUSH 100 UNIT/ML IV SOLN
500.0000 [IU] | Freq: Once | INTRAVENOUS | Status: AC
  Administered 2017-03-13: 500 [IU] via INTRAVENOUS
  Filled 2017-03-13: qty 5

## 2017-03-13 MED ORDER — SODIUM CHLORIDE 0.9% FLUSH
10.0000 mL | Freq: Once | INTRAVENOUS | Status: AC
  Administered 2017-03-13: 10 mL via INTRAVENOUS
  Filled 2017-03-13: qty 10

## 2017-03-13 NOTE — Progress Notes (Signed)
[place] OFFICE PROGRESS NOTE  Patient Care Team: [name], MD as PCP - General (Internal Medicine)  Cancer Staging No matching staging information was found for the patient.   Oncology History   # July-AUG 2018- RECURRENT Adeno Ca [EBUS; Right paratrach LN [Dr.Ram & Dr.Oaks]insuff tissue for testing; check liquid Bx]; PET- no distant mets.  # AUG 13th 2018- Carbo-Taxol with RT  ------------------------------------------------------------------------------   # MAY 2015- .Marland KitchenA right middle lobe adenocarcinoma s/p resection [Dr.Oaks].  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  may of 2015 4.Patient has finished 4 cycles of chemotherapy on January 23, 2014 Now patient would be randomized   to  our [facility] protocol for Tarceva vs. placebo because of EGFR mutation 3.patient desires to get off protocol because of progressive side effect even after reducing dose.  (May 13, 2014) 5.Patient is starting  maintenance investigationally approach with Tarceva vs. placebo September of 2015. 6.Patient came off protocol therapy because of significant GI side effect May 13, 2014   Patient is nonsmoker;        Primary cancer of right middle lobe of lung ([place])     INTERVAL HISTORY:  [facility] 66 y.o.  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  No fevers or chills. Denies any weight loss. Denies any headaches. No chest pain. She denies any headaches. No significant tingling or numbness. She noted to have significant drowsiness- after Benadryl; and also intolerance to steroids.  REVIEW OF SYSTEMS:  A complete 10 point review of system is done which is negative except mentioned above/history of present illness.   PAST MEDICAL HISTORY :   Past Medical History:  Diagnosis Date  . Acid reflux   . Anemia    HAD TO HAVE IRON INFUSIONS BACK IN 2015  . Arthritis    bil hands and back  . Asthma   . Cancer of right lung ([place]) 2015   lung cancer - chemo, Dr Genevive Bi removed RML Lobectomy.   Marland Kitchen Dyspnea    WITH EXERTION  . Hypertension   . Mitral valve prolapse     PAST SURGICAL HISTORY :   Past Surgical History:  Procedure Laterality Date  . ABDOMINAL HYSTERECTOMY    . BREAST BIOPSY Left 1994 (-), 2004 (-)  . COLONOSCOPY  2008    Dr. Donnella Sham  . KNEE SURGERY  2007  . LUNG LOBECTOMY Right    middle lobe  . PORTACATH PLACEMENT Right 02/09/2017   Procedure: INSERTION PORT-A-CATH;  Surgeon: Nestor Lewandowsky, MD;  Location: [facility] ORS;  Service: General;  Laterality: Right;  . SALIVARY GLAND SURGERY Left 1983   with lymph node removal also  . UPPER GI ENDOSCOPY  2008  . VIDEO BRONCHOSCOPY WITH ENDOBRONCHIAL ULTRASOUND Right 01/24/2017   Procedure: VIDEO BRONCHOSCOPY WITH ENDOBRONCHIAL ULTRASOUND;  Surgeon: [name], MD;  Location: [facility] ORS;  Service: Pulmonary;  Laterality: Right;    FAMILY HISTORY :   Family History  Problem Relation Age of Onset  . Breast cancer Maternal Aunt   . Breast cancer Paternal Aunt   . Breast cancer Maternal Grandmother   . Breast cancer Maternal Aunt   . Breast cancer Paternal Aunt   . Healthy Mother   . Healthy Father     SOCIAL HISTORY:   Social History  Substance Use Topics  . Smoking status: Never Smoker  . Smokeless tobacco: Never Used  . Alcohol use 4.2 oz/week    7 Glasses of wine per week    ALLERGIES:  is allergic to penicillins and sulfa antibiotics.  MEDICATIONS:  Current Outpatient Prescriptions  Medication Sig Dispense Refill  . albuterol (PROVENTIL HFA;VENTOLIN HFA) 108 (90 Base) MCG/ACT inhaler Inhale 1-2 puffs into the lungs every 6 (six) hours as needed for wheezing or shortness of breath.    . B Complex Vitamins (VITAMIN B COMPLEX PO) Take 1 tablet by mouth  daily.     . Biotin w/ Vitamins C & E (HAIR/SKIN/NAILS PO) Take 1 tablet by mouth daily.    . Calcium Carb-Cholecalciferol (CALCIUM 600+D) 600-800 MG-UNIT TABS Take 2 tablets by mouth daily.    . Cholecalciferol (D3 MAXIMUM STRENGTH) 5000 units capsule Take 5,000 Units by mouth daily.    . COLLAGEN PO Take 1,000 mg by mouth daily.    . cyclobenzaprine (FLEXERIL) 5 MG tablet Take 5 mg by mouth 3 (three) times daily as needed for muscle spasms.    . fexofenadine (ALLEGRA) 180 MG tablet Take 180 mg by mouth daily as needed for allergies.     . fluticasone (FLONASE) 50 MCG/ACT nasal spray Place 2 sprays into both nostrils daily as needed for allergies or rhinitis.    . Fluticasone-Salmeterol (ADVAIR) 250-50 MCG/DOSE AEPB Inhale 1 puff into the lungs 2 (two) times daily as needed (for respiratory issues.).    Marland Kitchen lansoprazole (PREVACID) 30 MG capsule Take 30 mg by mouth 2 (two) times daily before a meal.     . lidocaine-prilocaine (EMLA) cream Apply 1 application topically as needed. Apply generously over the Mediport 45 minutes prior to chemotherapy. 30 g 0  . losartan (COZAAR) 25 MG tablet Take 25 mg by mouth at bedtime.     . metoCLOPramide (REGLAN) 10 MG tablet Take 1 tablet (10 mg total) by mouth every 8 (eight) hours as needed for nausea. 40 tablet 3  . ondansetron (ZOFRAN) 8 MG tablet Take 1 tablet (8 mg total) by mouth every 8 (eight) hours as needed for nausea or vomiting (start 3 days; after chemo). 40 tablet 1  . prochlorperazine (COMPAZINE) 10 MG tablet Take 1 tablet (10 mg total) by mouth every 6 (six) hours as needed for nausea or vomiting. 40 tablet 1  . traZODone (DESYREL) 50 MG tablet Take 1 tablet by mouth 1 day or 1 dose.     No current facility-administered medications for this visit.     PHYSICAL EXAMINATION: ECOG PERFORMANCE STATUS: 0 - Asymptomatic  BP (!) 138/93 (BP Location: Left Arm, Patient Position: Sitting)   Pulse 72   Temp 98.6 F (37 C) (Tympanic)   Resp 16   Wt  132 lb 12.8 oz (60.2 kg)   BMI 22.80 kg/m   Filed Weights   03/13/17 1008  Weight: 132 lb 12.8 oz (60.2 kg)    GENERAL: Well-nourished well-developed; Alert, no distress and comfortable. She is alone. EYES: no pallor or icterus OROPHARYNX: no thrush or ulceration; good dentition  NECK: supple, no masses felt LYMPH:  no palpable lymphadenopathy in the cervical, axillary or inguinal regions LUNGS: clear to auscultation and  No wheeze or crackles HEART/CVS: regular rate & rhythm and no murmurs; No lower extremity edema ABDOMEN:abdomen soft, non-tender and normal bowel sounds Musculoskeletal:no cyanosis of digits and no clubbing  PSYCH: alert & oriented x 3 with fluent speech NEURO: no focal motor/sensory deficits  SKIN:  no rashes or significant lesions  LABORATORY DATA:  I have reviewed the data as listed    Component Value Date/Time   NA 135 03/13/2017 0929   NA 141 06/03/2014 1039   K 4.1 03/13/2017 0929   K 4.1 06/03/2014 1039   CL 104 03/13/2017 0929   CL 105 06/03/2014 1039   CO2 27 03/13/2017 0929   CO2 28 06/03/2014 1039   GLUCOSE 86 03/13/2017 0929   GLUCOSE 88 06/03/2014 1039   BUN 16 03/13/2017 0929   BUN 14 06/03/2014 1039   CREATININE 0.70 03/13/2017 0929   CREATININE 0.68 06/03/2014 1039   CALCIUM 8.7 (L) 03/13/2017 0929   CALCIUM 8.7 06/03/2014 1039   PROT 6.3 (L) 03/13/2017 0929   PROT 7.6 06/03/2014 1039   ALBUMIN 3.6 03/13/2017 0929   ALBUMIN 3.7 06/03/2014 1039   AST 33 03/13/2017 0929   AST 36 06/03/2014 1039   ALT 33 03/13/2017 0929   ALT 34 06/03/2014 1039   ALKPHOS 64 03/13/2017 0929   ALKPHOS 86 06/03/2014 1039   BILITOT 0.8 03/13/2017 0929   BILITOT 0.4 06/03/2014 1039   GFRNONAA >60 03/13/2017 0929   GFRNONAA >60 06/03/2014 1039   GFRNONAA >60 03/20/2014 1109   GFRAA >60 03/13/2017 0929   GFRAA >60 06/03/2014 1039   GFRAA >60 03/20/2014 1109    No results found for: SPEP, UPEP  Lab Results  Component Value Date   WBC 1.7 (L)  03/13/2017   NEUTROABS 1.0 (L) 03/13/2017   HGB 11.4 (L) 03/13/2017   HCT 32.6 (L) 03/13/2017   MCV 89.8 03/13/2017   PLT 179 03/13/2017      Chemistry      Component Value Date/Time   NA 135 03/13/2017 0929   NA 141 06/03/2014 1039   K 4.1 03/13/2017 0929   K 4.1 06/03/2014 1039   CL 104 03/13/2017 0929   CL 105 06/03/2014 1039   CO2 27 03/13/2017 0929   CO2 28 06/03/2014 1039   BUN 16 03/13/2017 0929   BUN 14 06/03/2014 1039   CREATININE 0.70 03/13/2017 0929   CREATININE 0.68 06/03/2014 1039      Component Value Date/Time   CALCIUM 8.7 (L) 03/13/2017 0929   CALCIUM 8.7 06/03/2014 1039   ALKPHOS 64 03/13/2017 0929   ALKPHOS 86 06/03/2014 1039   AST 33 03/13/2017 0929   AST 36 06/03/2014 1039   ALT 33 03/13/2017 0929   ALT 34 06/03/2014 1039   BILITOT 0.8 03/13/2017 0929   BILITOT 0.4 06/03/2014 1039     IMPRESSION: 1. Interval increase in size of bilateral hilar lymph nodes. The right hilar node now measures 1.6 cm. 2. Stable index nodule within the superior segment of right lower lobe. Similar appearance of subpleural nodularity overlying the right lung and extending along the major fissure. 3.  Aortic Atherosclerosis (ICD10-I70.0).   Electronically Signed   By: Kerby Moors M.D.   On: 12/16/2016 13:18  RADIOGRAPHIC STUDIES: I have personally reviewed the radiological images as listed and agreed with the findings in the report. No results found.   ASSESSMENT & PLAN:  Primary cancer of right middle lobe of lung ([place]) # Recurrent STAGE III lung ca [EGFR mutated; positive for adenocarcinoma of the right paratracheal lymph node]. July 2018- s/p EBUS of right paratrach LN- POSITIVE for Recurrence- Adeno Ca. PET- No evidence of distant metastatic disease. Patient currently on concurrent chemoradiation- with [place]- taxol weekly s/p 4 cycles;tolearting well- except ANC 1.0/WBC- rest WNL.  #  HOLD chemo today- given the ANC 1.0. Repeat labs in 1 week/ proceed  with chemotherapy if ANC higher [at least ANC= 1.2]. Patient felt uncomfortable holding treatment. I had to review my concerns with her.   # Intol to benardyl/ dex- decrease the dose of Benadryl to 12.5; and dexamethasone to 12 milligrams.  # Abdominal bloating- question related to steroids. Continue Pepcid twice a day and Tums as needed  # follow up in 2 weeks/chemo-MD/ weekly-labs.    No orders of the defined types were placed in this encounter.  All questions were answered. The patient knows to call the clinic with any problems, questions or concerns.      Cammie Sickle, MD 03/13/2017 12:50 PM

## 2017-03-13 NOTE — Assessment & Plan Note (Addendum)
#  Recurrent STAGE III lung ca [EGFR mutated; positive for adenocarcinoma of the right paratracheal lymph node]. July 2018- s/p EBUS of right paratrach LN- POSITIVE for Recurrence- Adeno Ca. PET- No evidence of distant metastatic disease. Patient currently on concurrent chemoradiation- with [place]- taxol weekly s/p 4 cycles;tolearting well- except ANC 1.0/WBC- rest WNL.  # HOLD chemo today- given the ANC 1.0. Repeat labs in 1 week/ proceed with chemotherapy if ANC higher [at least ANC= 1.2]. Patient felt uncomfortable holding treatment. I had to review my concerns with her.   # Intol to benardyl/ dex- decrease the dose of Benadryl to 12.5; and dexamethasone to 12 milligrams.  # Abdominal bloating- question related to steroids. Continue Pepcid twice a day and Tums as needed  # follow up in 2 weeks/chemo-MD/ weekly-labs.

## 2017-03-14 ENCOUNTER — Ambulatory Visit
Admission: RE | Admit: 2017-03-14 | Discharge: 2017-03-14 | Disposition: A | Discharge status: Home / Self Care | Payer: Medicare Other | Source: Ambulatory Visit | Attending: Radiation Oncology | Admitting: Radiation Oncology

## 2017-03-14 ENCOUNTER — Ambulatory Visit: Payer: Medicare Other

## 2017-03-14 DIAGNOSIS — Z510 Encounter for antineoplastic radiation therapy: Secondary | ICD-10-CM | POA: Diagnosis not present

## 2017-03-15 ENCOUNTER — Ambulatory Visit: Payer: Medicare Other

## 2017-03-15 ENCOUNTER — Ambulatory Visit
Admission: RE | Admit: 2017-03-15 | Discharge: 2017-03-15 | Disposition: A | Discharge status: Home / Self Care | Payer: Medicare Other | Source: Ambulatory Visit | Attending: Radiation Oncology | Admitting: Radiation Oncology

## 2017-03-15 DIAGNOSIS — Z510 Encounter for antineoplastic radiation therapy: Secondary | ICD-10-CM | POA: Diagnosis not present

## 2017-03-16 ENCOUNTER — Ambulatory Visit
Admission: RE | Admit: 2017-03-16 | Discharge: 2017-03-16 | Disposition: A | Discharge status: Home / Self Care | Payer: Medicare Other | Source: Ambulatory Visit | Attending: Radiation Oncology | Admitting: Radiation Oncology

## 2017-03-16 ENCOUNTER — Ambulatory Visit: Payer: Medicare Other

## 2017-03-16 DIAGNOSIS — Z510 Encounter for antineoplastic radiation therapy: Secondary | ICD-10-CM | POA: Diagnosis not present

## 2017-03-17 ENCOUNTER — Ambulatory Visit: Payer: Medicare Other

## 2017-03-17 ENCOUNTER — Inpatient Hospital Stay: Payer: Medicare Other | Admitting: Internal Medicine

## 2017-03-17 ENCOUNTER — Ambulatory Visit
Admission: RE | Admit: 2017-03-17 | Discharge: 2017-03-17 | Disposition: A | Discharge status: Home / Self Care | Payer: Medicare Other | Source: Ambulatory Visit | Attending: Radiation Oncology | Admitting: Radiation Oncology

## 2017-03-17 DIAGNOSIS — Z510 Encounter for antineoplastic radiation therapy: Secondary | ICD-10-CM | POA: Diagnosis not present

## 2017-03-20 ENCOUNTER — Ambulatory Visit: Admission: RE | Admit: 2017-03-20 | Payer: Medicare Other | Source: Ambulatory Visit

## 2017-03-20 ENCOUNTER — Inpatient Hospital Stay: Payer: Medicare Other

## 2017-03-20 ENCOUNTER — Ambulatory Visit: Payer: Medicare Other

## 2017-03-20 ENCOUNTER — Other Ambulatory Visit: Payer: Medicare Other

## 2017-03-20 ENCOUNTER — Other Ambulatory Visit: Payer: Self-pay | Admitting: Internal Medicine

## 2017-03-20 VITALS — BP 138/91 | HR 65 | Temp 96.3°F | Resp 20 | Wt 134.0 lb

## 2017-03-20 DIAGNOSIS — C342 Malignant neoplasm of middle lobe, bronchus or lung: Secondary | ICD-10-CM

## 2017-03-20 DIAGNOSIS — Z5111 Encounter for antineoplastic chemotherapy: Secondary | ICD-10-CM | POA: Diagnosis not present

## 2017-03-20 LAB — CBC WITH DIFFERENTIAL/PLATELET
Basophils Absolute: 0 10*3/uL (ref 0–0.1)
Basophils Relative: 1 %
EOS ABS: 0 10*3/uL (ref 0–0.7)
EOS PCT: 0 %
HCT: 33.3 % — ABNORMAL LOW (ref 35.0–47.0)
Hemoglobin: 11.7 g/dL — ABNORMAL LOW (ref 12.0–16.0)
LYMPHS ABS: 0.4 10*3/uL — AB (ref 1.0–3.6)
Lymphocytes Relative: 17 %
MCH: 31.9 pg (ref 26.0–34.0)
MCHC: 35.1 g/dL (ref 32.0–36.0)
MCV: 90.7 fL (ref 80.0–100.0)
MONO ABS: 0.3 10*3/uL (ref 0.2–0.9)
MONOS PCT: 13 %
Neutro Abs: 1.5 10*3/uL (ref 1.4–6.5)
Neutrophils Relative %: 69 %
PLATELETS: 185 10*3/uL (ref 150–440)
RBC: 3.68 MIL/uL — ABNORMAL LOW (ref 3.80–5.20)
RDW: 15.4 % — ABNORMAL HIGH (ref 11.5–14.5)
WBC: 2.1 10*3/uL — ABNORMAL LOW (ref 3.6–11.0)

## 2017-03-20 MED ORDER — DIPHENHYDRAMINE HCL 50 MG/ML IJ SOLN
12.5000 mg | Freq: Once | INTRAMUSCULAR | Status: AC
  Administered 2017-03-20: 12.5 mg via INTRAVENOUS
  Filled 2017-03-20: qty 1

## 2017-03-20 MED ORDER — SODIUM CHLORIDE 0.9 % IV SOLN
Freq: Once | INTRAVENOUS | Status: AC
  Administered 2017-03-20: 10:00:00 via INTRAVENOUS
  Filled 2017-03-20: qty 1000

## 2017-03-20 MED ORDER — FAMOTIDINE IN NACL 20-0.9 MG/50ML-% IV SOLN
20.0000 mg | Freq: Once | INTRAVENOUS | Status: AC
  Administered 2017-03-20: 20 mg via INTRAVENOUS
  Filled 2017-03-20: qty 50

## 2017-03-20 MED ORDER — PALONOSETRON HCL INJECTION 0.25 MG/5ML
0.2500 mg | Freq: Once | INTRAVENOUS | Status: AC
  Administered 2017-03-20: 0.25 mg via INTRAVENOUS
  Filled 2017-03-20: qty 5

## 2017-03-20 MED ORDER — HEPARIN SOD (PORK) LOCK FLUSH 100 UNIT/ML IV SOLN
500.0000 [IU] | Freq: Once | INTRAVENOUS | Status: AC | PRN
  Administered 2017-03-20: 500 [IU]
  Filled 2017-03-20: qty 5

## 2017-03-20 MED ORDER — SODIUM CHLORIDE 0.9 % IV SOLN
12.0000 mg | Freq: Once | INTRAVENOUS | Status: AC
  Administered 2017-03-20: 12 mg via INTRAVENOUS
  Filled 2017-03-20: qty 1.2

## 2017-03-20 MED ORDER — SODIUM CHLORIDE 0.9% FLUSH
10.0000 mL | INTRAVENOUS | Status: DC | PRN
  Filled 2017-03-20: qty 10

## 2017-03-20 MED ORDER — PACLITAXEL CHEMO INJECTION 300 MG/50ML
45.0000 mg/m2 | Freq: Once | INTRAVENOUS | Status: AC
  Administered 2017-03-20: 72 mg via INTRAVENOUS
  Filled 2017-03-20: qty 12

## 2017-03-20 MED ORDER — SODIUM CHLORIDE 0.9 % IV SOLN
150.0000 mg | Freq: Once | INTRAVENOUS | Status: AC
  Administered 2017-03-20: 150 mg via INTRAVENOUS
  Filled 2017-03-20: qty 15

## 2017-03-21 ENCOUNTER — Ambulatory Visit: Payer: Medicare Other

## 2017-03-21 ENCOUNTER — Ambulatory Visit
Admission: RE | Admit: 2017-03-21 | Discharge: 2017-03-21 | Disposition: A | Discharge status: Home / Self Care | Payer: Medicare Other | Source: Ambulatory Visit | Attending: Radiation Oncology | Admitting: Radiation Oncology

## 2017-03-21 DIAGNOSIS — Z510 Encounter for antineoplastic radiation therapy: Secondary | ICD-10-CM | POA: Diagnosis not present

## 2017-03-22 ENCOUNTER — Ambulatory Visit: Payer: Medicare Other

## 2017-03-22 ENCOUNTER — Ambulatory Visit
Admission: RE | Admit: 2017-03-22 | Discharge: 2017-03-22 | Disposition: A | Discharge status: Home / Self Care | Payer: Medicare Other | Source: Ambulatory Visit | Attending: Radiation Oncology | Admitting: Radiation Oncology

## 2017-03-23 ENCOUNTER — Ambulatory Visit
Admission: RE | Admit: 2017-03-23 | Discharge: 2017-03-23 | Disposition: A | Discharge status: Home / Self Care | Payer: Medicare Other | Source: Ambulatory Visit | Attending: Radiation Oncology | Admitting: Radiation Oncology

## 2017-03-23 ENCOUNTER — Ambulatory Visit: Payer: Medicare Other

## 2017-03-23 DIAGNOSIS — Z510 Encounter for antineoplastic radiation therapy: Secondary | ICD-10-CM | POA: Diagnosis not present

## 2017-03-24 ENCOUNTER — Ambulatory Visit
Admission: RE | Admit: 2017-03-24 | Discharge: 2017-03-24 | Disposition: A | Discharge status: Home / Self Care | Payer: Medicare Other | Source: Ambulatory Visit | Attending: Radiation Oncology | Admitting: Radiation Oncology

## 2017-03-24 ENCOUNTER — Ambulatory Visit: Payer: Medicare Other

## 2017-03-24 DIAGNOSIS — Z510 Encounter for antineoplastic radiation therapy: Secondary | ICD-10-CM | POA: Diagnosis not present

## 2017-03-27 ENCOUNTER — Ambulatory Visit: Payer: Medicare Other

## 2017-03-27 ENCOUNTER — Inpatient Hospital Stay (HOSPITAL_BASED_OUTPATIENT_CLINIC_OR_DEPARTMENT_OTHER): Payer: Medicare Other | Admitting: Internal Medicine

## 2017-03-27 ENCOUNTER — Inpatient Hospital Stay: Payer: Medicare Other

## 2017-03-27 ENCOUNTER — Ambulatory Visit
Admission: RE | Admit: 2017-03-27 | Discharge: 2017-03-27 | Disposition: A | Discharge status: Home / Self Care | Payer: Medicare Other | Source: Ambulatory Visit | Attending: Radiation Oncology | Admitting: Radiation Oncology

## 2017-03-27 DIAGNOSIS — Z79899 Other long term (current) drug therapy: Secondary | ICD-10-CM

## 2017-03-27 DIAGNOSIS — Z510 Encounter for antineoplastic radiation therapy: Secondary | ICD-10-CM | POA: Diagnosis not present

## 2017-03-27 DIAGNOSIS — R140 Abdominal distension (gaseous): Secondary | ICD-10-CM

## 2017-03-27 DIAGNOSIS — C342 Malignant neoplasm of middle lobe, bronchus or lung: Secondary | ICD-10-CM

## 2017-03-27 DIAGNOSIS — K5900 Constipation, unspecified: Secondary | ICD-10-CM

## 2017-03-27 DIAGNOSIS — Z5111 Encounter for antineoplastic chemotherapy: Secondary | ICD-10-CM | POA: Diagnosis not present

## 2017-03-27 LAB — CBC WITH DIFFERENTIAL/PLATELET
BASOS PCT: 1 %
Basophils Absolute: 0 10*3/uL (ref 0–0.1)
Eosinophils Absolute: 0 10*3/uL (ref 0–0.7)
Eosinophils Relative: 2 %
HEMATOCRIT: 31.7 % — AB (ref 35.0–47.0)
HEMOGLOBIN: 11.1 g/dL — AB (ref 12.0–16.0)
LYMPHS PCT: 21 %
Lymphs Abs: 0.4 10*3/uL — ABNORMAL LOW (ref 1.0–3.6)
MCH: 31.9 pg (ref 26.0–34.0)
MCHC: 35.2 g/dL (ref 32.0–36.0)
MCV: 90.7 fL (ref 80.0–100.0)
MONO ABS: 0.2 10*3/uL (ref 0.2–0.9)
MONOS PCT: 11 %
NEUTROS ABS: 1.1 10*3/uL — AB (ref 1.4–6.5)
NEUTROS PCT: 65 %
Platelets: 217 10*3/uL (ref 150–440)
RBC: 3.49 MIL/uL — ABNORMAL LOW (ref 3.80–5.20)
RDW: 15.6 % — AB (ref 11.5–14.5)
WBC: 1.7 10*3/uL — ABNORMAL LOW (ref 3.6–11.0)

## 2017-03-27 LAB — COMPREHENSIVE METABOLIC PANEL
ALBUMIN: 3.6 g/dL (ref 3.5–5.0)
ALK PHOS: 78 U/L (ref 38–126)
ALT: 34 U/L (ref 14–54)
ANION GAP: 6 (ref 5–15)
AST: 34 U/L (ref 15–41)
BILIRUBIN TOTAL: 0.5 mg/dL (ref 0.3–1.2)
BUN: 17 mg/dL (ref 6–20)
CALCIUM: 8.8 mg/dL — AB (ref 8.9–10.3)
CO2: 26 mmol/L (ref 22–32)
Chloride: 103 mmol/L (ref 101–111)
Creatinine, Ser: 0.68 mg/dL (ref 0.44–1.00)
GFR calc Af Amer: >60 mL/min (ref >60)
GLUCOSE: 84 mg/dL (ref 65–99)
Potassium: 3.9 mmol/L (ref 3.5–5.1)
Sodium: 135 mmol/L (ref 135–145)
TOTAL PROTEIN: 6.5 g/dL (ref 6.5–8.1)

## 2017-03-27 MED ORDER — DIPHENHYDRAMINE HCL 50 MG/ML IJ SOLN
12.5000 mg | Freq: Once | INTRAMUSCULAR | Status: AC
  Administered 2017-03-27: 12.5 mg via INTRAVENOUS
  Filled 2017-03-27: qty 1

## 2017-03-27 MED ORDER — PALONOSETRON HCL INJECTION 0.25 MG/5ML
0.2500 mg | Freq: Once | INTRAVENOUS | Status: AC
  Administered 2017-03-27: 0.25 mg via INTRAVENOUS
  Filled 2017-03-27: qty 5

## 2017-03-27 MED ORDER — SODIUM CHLORIDE 0.9 % IV SOLN
12.0000 mg | Freq: Once | INTRAVENOUS | Status: AC
  Administered 2017-03-27: 12 mg via INTRAVENOUS
  Filled 2017-03-27: qty 1.2

## 2017-03-27 MED ORDER — FAMOTIDINE IN NACL 20-0.9 MG/50ML-% IV SOLN
20.0000 mg | Freq: Once | INTRAVENOUS | Status: AC
  Administered 2017-03-27: 20 mg via INTRAVENOUS
  Filled 2017-03-27: qty 50

## 2017-03-27 MED ORDER — SODIUM CHLORIDE 0.9 % IV SOLN
152.4000 mg | Freq: Once | INTRAVENOUS | Status: AC
  Administered 2017-03-27: 150 mg via INTRAVENOUS
  Filled 2017-03-27: qty 15

## 2017-03-27 MED ORDER — DEXTROSE 5 % IV SOLN
45.0000 mg/m2 | Freq: Once | INTRAVENOUS | Status: AC
  Administered 2017-03-27: 72 mg via INTRAVENOUS
  Filled 2017-03-27: qty 12

## 2017-03-27 MED ORDER — SODIUM CHLORIDE 0.9 % IV SOLN
Freq: Once | INTRAVENOUS | Status: AC
  Administered 2017-03-27: 11:00:00 via INTRAVENOUS
  Filled 2017-03-27: qty 1000

## 2017-03-27 MED ORDER — HEPARIN SOD (PORK) LOCK FLUSH 100 UNIT/ML IV SOLN
500.0000 [IU] | Freq: Once | INTRAVENOUS | Status: AC | PRN
  Administered 2017-03-27: 500 [IU]
  Filled 2017-03-27: qty 5

## 2017-03-27 NOTE — Progress Notes (Signed)
Patient here for follow-up for lung cancer. She reports intermittent constipation (relieved by miralax) and nausea  (improved with Reglan and compazine).

## 2017-03-27 NOTE — Progress Notes (Signed)
Per MD note:  # Proceed with chemo today- given the ANC 1.1;

## 2017-03-27 NOTE — Assessment & Plan Note (Addendum)
#  Recurrent STAGE III lung ca [EGFR mutated; positive for adenocarcinoma of the right paratracheal lymph node].   # Currently on concurrent chemoradiation- with carbo- taxol weekly s/p 4 cycles;tolearting well- except ANC 1.1/WBC- rest WNL.  # Proceed with chemo today- given the ANC 1.1;   # Intol to benardyl/ dex- decrease the dose of Benadryl to 12.5; and dexamethasone to 12 milligrams.  # Abdominal bloating- question related to steroids. Continue Prevacid twice a day and Tums as needed  # Constipation- G-1 miralax prn. If not improved then recommend senna.   # follow up in 2 weeks/chemo-MD/ weekly-labs [pt will finish RT on oct 12th].

## 2017-03-27 NOTE — Progress Notes (Signed)
[facility] OFF[facility] PROGRESS NOTE  Patient Care Team: Sparks, Jeffrey D, MD as PCP - General (Internal Medicine)  Cancer Staging No matching staging information was found for the patient.   Oncology History   # July-AUG 2018- RECURRENT STAGE III Adeno Ca [EBUS; Right paratrach LN [Dr.Ram & Dr.Oaks] insuff tissue for testing; check liquid Bx]; PET- no distant mets.  # AUG 13th 2018- Carbo-Taxol with RT  ------------------------------------------------------------------------------   # MAY 2015- ..A right middle lobe adenocarcinoma s/p resection [Dr.Oaks].  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  may of 2015 4.Patient has finished 4 cycles of chemotherapy on January 23, 2014 Now patient would be randomized   to  our [facility] protocol for Tarceva vs. placebo because of EGFR mutation 3.patient desires to get off protocol because of progressive side effect even after reducing dose.  (May 13, 2014) 5.Patient is starting  maintenance investigationally approach with Tarceva vs. placebo September of 2015. 6.Patient came off protocol therapy because of significant GI side effect May 13, 2014   Patient is nonsmoker;        Primary cancer of right middle lobe of lung ([facility])     INTERVAL HISTORY:  [facility] 66 y.o.  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  She complains mild constipation. Mild to moderate abdominal comfort/epigastric discomfort. Continues on Prevacid and Tums. No fevers or chills. Denies any weight loss. Denies any headaches. No chest pain. She denies any headaches. No significant tingling or numbness. Patient's tolerance to chemotherapy has improved after decreasing the dose of steroids and Benadryl.  REVIEW OF SYSTEMS:   A complete 10 point review of system is done which is negative except mentioned above/history of present illness.   PAST MEDICAL HISTORY :  Past Medical History:  Diagnosis Date  . Acid reflux   . Anemia    HAD TO HAVE IRON INFUSIONS BACK IN 2015  . Arthritis    bil hands and back  . Asthma   . Cancer of right lung ([facility]) 2015   lung cancer - chemo, Dr Oaks removed RML Lobectomy.   . Dyspnea    WITH EXERTION  . Hypertension   . Mitral valve prolapse     PAST SURGICAL HISTORY :   Past Surgical History:  Procedure Laterality Date  . ABDOMINAL HYSTERECTOMY    . BREAST BIOPSY Left 1994 (-), 2004 (-)  . COLONOSCOPY  2008    Dr. Skulski  . KNEE SURGERY  2007  . LUNG LOBECTOMY Right    middle lobe  . PORTACATH PLACEMENT Right 02/09/2017   Procedure: INSERTION PORT-A-CATH;  Surgeon: Oaks, Timothy, MD;  Location: [facility] ORS;  Service: General;  Laterality: Right;  . SALIVARY GLAND SURGERY Left 1983   with lymph node removal also  . UPPER GI ENDOSCOPY  2008  . VIDEO BRONCHOSCOPY WITH ENDOBRONCHIAL ULTRASOUND Right 01/24/2017   Procedure: VIDEO BRONCHOSCOPY WITH ENDOBRONCHIAL ULTRASOUND;  Surgeon: Ramachandran, Pradeep, MD;  Location: [facility] OR[facility];  Service: Pulmonary;  Laterality: Right;    FAMILY HISTORY :   Family History  Problem Relation Age of Onset  . Breast cancer Maternal Aunt   . Breast cancer Paternal Aunt   . Breast cancer Maternal Grandmother   . Breast cancer Maternal Aunt   . Breast cancer Paternal Aunt   .   Healthy Mother   . Healthy Father     SOCIAL HISTORY:   Social History  Substance Use Topics  . Smoking status: Never Smoker  . Smokeless tobacco: Never Used  . Alcohol use 4.2 oz/week    7 Glasses of wine per week    ALLERGIES:  is allergic to penicillins and sulfa antibiotics.  MEDICATIONS:  Current Outpatient Prescriptions  Medication Sig Dispense Refill  . albuterol (PROVENTIL HFA;VENTOLIN HFA) 108 (90 Base) MCG/ACT inhaler Inhale 1-2 puffs into the  lungs every 6 (six) hours as needed for wheezing or shortness of breath.    . B Complex Vitamins (VITAMIN B COMPLEX PO) Take 1 tablet by mouth daily.     . Biotin w/ Vitamins C & E (HAIR/SKIN/NAILS PO) Take 1 tablet by mouth daily.    . Calcium Carb-Cholecalciferol (CALCIUM 600+D) 600-800 MG-UNIT TABS Take 2 tablets by mouth daily.    . Cholecalciferol (D3 MAXIMUM STRENGTH) 5000 units capsule Take 5,000 Units by mouth daily.    . COLLAGEN PO Take 1,000 mg by mouth daily.    . cyclobenzaprine (FLEXERIL) 5 MG tablet Take 5 mg by mouth 3 (three) times daily as needed for muscle spasms.    . fexofenadine (ALLEGRA) 180 MG tablet Take 180 mg by mouth daily as needed for allergies.     . fluticasone (FLONASE) 50 MCG/ACT nasal spray Place 2 sprays into both nostrils daily as needed for allergies or rhinitis.    . Fluticasone-Salmeterol (ADVAIR) 250-50 MCG/DOSE AEPB Inhale 1 puff into the lungs 2 (two) times daily as needed (for respiratory issues.).    . lansoprazole (PREVACID) 30 MG capsule Take 30 mg by mouth 2 (two) times daily before a meal.     . lidocaine-prilocaine (EMLA) cream Apply 1 application topically as needed. Apply generously over the Mediport 45 minutes prior to chemotherapy. 30 g 0  . losartan (COZAAR) 25 MG tablet Take 25 mg by mouth at bedtime.     . metoCLOPramide (REGLAN) 10 MG tablet Take 1 tablet (10 mg total) by mouth every 8 (eight) hours as needed for nausea. 40 tablet 3  . ondansetron (ZOFRAN) 8 MG tablet Take 1 tablet (8 mg total) by mouth every 8 (eight) hours as needed for nausea or vomiting (start 3 days; after chemo). 40 tablet 1  . polyethylene glycol (MIRALAX / GLYCOLAX) packet Take 17 g by mouth daily as needed for mild constipation.    . prochlorperazine (COMPAZINE) 10 MG tablet Take 1 tablet (10 mg total) by mouth every 6 (six) hours as needed for nausea or vomiting. 40 tablet 1  . traZODone (DESYREL) 50 MG tablet Take 1 tablet by mouth 1 day or 1 dose.     No current  facility-administered medications for this visit.     PHYSICAL EXAMINATION: ECOG PERFORMANCE STATUS: 0 - Asymptomatic  There were no vitals taken for this visit.  There were no vitals filed for this visit.  GENERAL: Well-nourished well-developed; Alert, no distress and comfortable. She is alone. EYES: no pallor or icterus OROPHARYNX: no thrush or ulceration; good dentition  NECK: supple, no masses felt LYMPH:  no palpable lymphadenopathy in the cervical, axillary or inguinal regions LUNGS: clear to auscultation and  No wheeze or crackles HEART/CVS: regular rate & rhythm and no murmurs; No lower extremity edema ABDOMEN:abdomen soft, non-tender and normal bowel sounds Musculoskeletal:no cyanosis of digits and no clubbing  PSYCH: alert & oriented x 3 with fluent speech NEURO: no focal motor/sensory deficits SKIN:    no rashes or significant lesions  LABORATORY DATA:  I have reviewed the data as listed    Component Value Date/Time   NA 135 03/27/2017 1003   NA 141 06/03/2014 1039   K 3.9 03/27/2017 1003   K 4.1 06/03/2014 1039   CL 103 03/27/2017 1003   CL 105 06/03/2014 1039   CO2 26 03/27/2017 1003   CO2 28 06/03/2014 1039   GLUCOSE 84 03/27/2017 1003   GLUCOSE 88 06/03/2014 1039   BUN 17 03/27/2017 1003   BUN 14 06/03/2014 1039   CREATININE 0.68 03/27/2017 1003   CREATININE 0.68 06/03/2014 1039   CALCIUM 8.8 (L) 03/27/2017 1003   CALCIUM 8.7 06/03/2014 1039   PROT 6.5 03/27/2017 1003   PROT 7.6 06/03/2014 1039   ALBUMIN 3.6 03/27/2017 1003   ALBUMIN 3.7 06/03/2014 1039   AST 34 03/27/2017 1003   AST 36 06/03/2014 1039   ALT 34 03/27/2017 1003   ALT 34 06/03/2014 1039   ALKPHOS 78 03/27/2017 1003   ALKPHOS 86 06/03/2014 1039   BILITOT 0.5 03/27/2017 1003   BILITOT 0.4 06/03/2014 1039   GFRNONAA >60 03/27/2017 1003   GFRNONAA >60 06/03/2014 1039   GFRNONAA >60 03/20/2014 1109   GFRAA >60 03/27/2017 1003   GFRAA >60 06/03/2014 1039   GFRAA >60 03/20/2014 1109     No results found for: SPEP, UPEP  Lab Results  Component Value Date   WBC 1.7 (L) 03/27/2017   NEUTROABS 1.1 (L) 03/27/2017   HGB 11.1 (L) 03/27/2017   HCT 31.7 (L) 03/27/2017   MCV 90.7 03/27/2017   PLT 217 03/27/2017      Chemistry      Component Value Date/Time   NA 135 03/27/2017 1003   NA 141 06/03/2014 1039   K 3.9 03/27/2017 1003   K 4.1 06/03/2014 1039   CL 103 03/27/2017 1003   CL 105 06/03/2014 1039   CO2 26 03/27/2017 1003   CO2 28 06/03/2014 1039   BUN 17 03/27/2017 1003   BUN 14 06/03/2014 1039   CREATININE 0.68 03/27/2017 1003   CREATININE 0.68 06/03/2014 1039      Component Value Date/Time   CALCIUM 8.8 (L) 03/27/2017 1003   CALCIUM 8.7 06/03/2014 1039   ALKPHOS 78 03/27/2017 1003   ALKPHOS 86 06/03/2014 1039   AST 34 03/27/2017 1003   AST 36 06/03/2014 1039   ALT 34 03/27/2017 1003   ALT 34 06/03/2014 1039   BILITOT 0.5 03/27/2017 1003   BILITOT 0.4 06/03/2014 1039     IMPRESSION: 1. Interval increase in size of bilateral hilar lymph nodes. The right hilar node now measures 1.6 cm. 2. Stable index nodule within the superior segment of right lower lobe. Similar appearance of subpleural nodularity overlying the right lung and extending along the major fissure. 3.  Aortic Atherosclerosis (ICD10-I70.0).   Electronically Signed   By: Taylor  Stroud M.D.   On: 12/16/2016 13:18  RADIOGRAPHIC STUDIES: I have personally reviewed the radiological images as listed and agreed with the findings in the report. No results found.   ASSESSMENT & PLAN:  Primary cancer of right middle lobe of lung ([facility]) # Recurrent STAGE III lung ca [EGFR mutated; positive for adenocarcinoma of the right paratracheal lymph node].   # Currently on concurrent chemoradiation- with carbo- taxol weekly s/p 4 cycles;tolearting well- except ANC 1.1/WBC- rest WNL.  # Proceed with chemo today- given the ANC 1.1;   # Intol to benardyl/ dex- decrease the dose of   Benadryl to  12.5; and dexamethasone to 12 milligrams.  # Abdominal bloating- question related to steroids. Continue Prevacid twice a day and Tums as needed  # Constipation- G-1 miralax prn. If not improved then recommend senna.   # follow up in 2 weeks/chemo-MD/ weekly-labs [pt will finish RT on oct 12th].    No orders of the defined types were placed in this encounter.  All questions were answered. The patient knows to call the clinic with any problems, questions or concerns.       R , MD 03/27/2017 9:18 PM 

## 2017-03-28 ENCOUNTER — Ambulatory Visit
Admission: RE | Admit: 2017-03-28 | Discharge: 2017-03-28 | Disposition: A | Discharge status: Home / Self Care | Payer: Medicare Other | Source: Ambulatory Visit | Attending: Radiation Oncology | Admitting: Radiation Oncology

## 2017-03-28 ENCOUNTER — Ambulatory Visit: Payer: Medicare Other

## 2017-03-28 DIAGNOSIS — Z510 Encounter for antineoplastic radiation therapy: Secondary | ICD-10-CM | POA: Diagnosis not present

## 2017-03-29 ENCOUNTER — Ambulatory Visit
Admission: RE | Admit: 2017-03-29 | Discharge: 2017-03-29 | Disposition: A | Discharge status: Home / Self Care | Payer: Medicare Other | Source: Ambulatory Visit | Attending: Radiation Oncology | Admitting: Radiation Oncology

## 2017-03-29 ENCOUNTER — Ambulatory Visit: Payer: Medicare Other

## 2017-03-29 DIAGNOSIS — Z510 Encounter for antineoplastic radiation therapy: Secondary | ICD-10-CM | POA: Diagnosis not present

## 2017-03-30 ENCOUNTER — Ambulatory Visit: Payer: Medicare Other

## 2017-03-30 ENCOUNTER — Ambulatory Visit
Admission: RE | Admit: 2017-03-30 | Discharge: 2017-03-30 | Disposition: A | Discharge status: Home / Self Care | Payer: Medicare Other | Source: Ambulatory Visit | Attending: Radiation Oncology | Admitting: Radiation Oncology

## 2017-03-30 DIAGNOSIS — Z510 Encounter for antineoplastic radiation therapy: Secondary | ICD-10-CM | POA: Diagnosis not present

## 2017-03-31 ENCOUNTER — Ambulatory Visit
Admission: RE | Admit: 2017-03-31 | Discharge: 2017-03-31 | Disposition: A | Discharge status: Home / Self Care | Payer: Medicare Other | Source: Ambulatory Visit | Attending: Radiation Oncology | Admitting: Radiation Oncology

## 2017-03-31 ENCOUNTER — Ambulatory Visit: Payer: Medicare Other

## 2017-03-31 DIAGNOSIS — Z510 Encounter for antineoplastic radiation therapy: Secondary | ICD-10-CM | POA: Diagnosis not present

## 2017-04-03 ENCOUNTER — Inpatient Hospital Stay: Payer: Medicare Other

## 2017-04-03 ENCOUNTER — Other Ambulatory Visit: Payer: Self-pay | Admitting: Internal Medicine

## 2017-04-03 ENCOUNTER — Ambulatory Visit
Admission: RE | Admit: 2017-04-03 | Discharge: 2017-04-03 | Disposition: A | Discharge status: Home / Self Care | Payer: Medicare Other | Source: Ambulatory Visit | Attending: Radiation Oncology | Admitting: Radiation Oncology

## 2017-04-03 ENCOUNTER — Ambulatory Visit: Payer: Medicare Other

## 2017-04-03 ENCOUNTER — Inpatient Hospital Stay: Payer: Medicare Other | Attending: Internal Medicine

## 2017-04-03 VITALS — Wt 133.0 lb

## 2017-04-03 DIAGNOSIS — C342 Malignant neoplasm of middle lobe, bronchus or lung: Secondary | ICD-10-CM | POA: Insufficient documentation

## 2017-04-03 DIAGNOSIS — I700 Atherosclerosis of aorta: Secondary | ICD-10-CM | POA: Diagnosis not present

## 2017-04-03 DIAGNOSIS — I341 Nonrheumatic mitral (valve) prolapse: Secondary | ICD-10-CM | POA: Insufficient documentation

## 2017-04-03 DIAGNOSIS — J45909 Unspecified asthma, uncomplicated: Secondary | ICD-10-CM | POA: Insufficient documentation

## 2017-04-03 DIAGNOSIS — Z510 Encounter for antineoplastic radiation therapy: Secondary | ICD-10-CM | POA: Diagnosis not present

## 2017-04-03 DIAGNOSIS — Z5111 Encounter for antineoplastic chemotherapy: Secondary | ICD-10-CM | POA: Insufficient documentation

## 2017-04-03 DIAGNOSIS — T451X5S Adverse effect of antineoplastic and immunosuppressive drugs, sequela: Secondary | ICD-10-CM | POA: Diagnosis not present

## 2017-04-03 DIAGNOSIS — Z79899 Other long term (current) drug therapy: Secondary | ICD-10-CM | POA: Insufficient documentation

## 2017-04-03 DIAGNOSIS — K219 Gastro-esophageal reflux disease without esophagitis: Secondary | ICD-10-CM | POA: Insufficient documentation

## 2017-04-03 DIAGNOSIS — K5900 Constipation, unspecified: Secondary | ICD-10-CM | POA: Diagnosis not present

## 2017-04-03 DIAGNOSIS — I10 Essential (primary) hypertension: Secondary | ICD-10-CM | POA: Diagnosis not present

## 2017-04-03 DIAGNOSIS — D709 Neutropenia, unspecified: Secondary | ICD-10-CM | POA: Insufficient documentation

## 2017-04-03 LAB — CBC WITH DIFFERENTIAL/PLATELET
BASOS ABS: 0 10*3/uL (ref 0–0.1)
Basophils Relative: 1 %
Eosinophils Absolute: 0 10*3/uL (ref 0–0.7)
Eosinophils Relative: 2 %
HEMATOCRIT: 32.3 % — AB (ref 35.0–47.0)
Hemoglobin: 11.4 g/dL — ABNORMAL LOW (ref 12.0–16.0)
LYMPHS ABS: 0.3 10*3/uL — AB (ref 1.0–3.6)
LYMPHS PCT: 22 %
MCH: 32 pg (ref 26.0–34.0)
MCHC: 35.2 g/dL (ref 32.0–36.0)
MCV: 90.9 fL (ref 80.0–100.0)
MONO ABS: 0.2 10*3/uL (ref 0.2–0.9)
MONOS PCT: 11 %
NEUTROS ABS: 1 10*3/uL — AB (ref 1.4–6.5)
Neutrophils Relative %: 64 %
Platelets: 254 10*3/uL (ref 150–440)
RBC: 3.55 MIL/uL — ABNORMAL LOW (ref 3.80–5.20)
RDW: 15.7 % — AB (ref 11.5–14.5)
WBC: 1.5 10*3/uL — ABNORMAL LOW (ref 3.6–11.0)

## 2017-04-03 MED ORDER — FAMOTIDINE IN NACL 20-0.9 MG/50ML-% IV SOLN
20.0000 mg | Freq: Once | INTRAVENOUS | Status: AC
  Administered 2017-04-03: 20 mg via INTRAVENOUS

## 2017-04-03 MED ORDER — SODIUM CHLORIDE 0.9 % IV SOLN
Freq: Once | INTRAVENOUS | Status: AC
  Administered 2017-04-03: 10:00:00 via INTRAVENOUS
  Filled 2017-04-03: qty 1000

## 2017-04-03 MED ORDER — PALONOSETRON HCL INJECTION 0.25 MG/5ML
0.2500 mg | Freq: Once | INTRAVENOUS | Status: AC
  Administered 2017-04-03: 0.25 mg via INTRAVENOUS
  Filled 2017-04-03: qty 5

## 2017-04-03 MED ORDER — SODIUM CHLORIDE 0.9 % IV SOLN
150.0000 mg | Freq: Once | INTRAVENOUS | Status: AC
  Administered 2017-04-03: 150 mg via INTRAVENOUS
  Filled 2017-04-03: qty 15

## 2017-04-03 MED ORDER — DIPHENHYDRAMINE HCL 50 MG/ML IJ SOLN
12.5000 mg | Freq: Once | INTRAMUSCULAR | Status: AC
  Administered 2017-04-03: 12.5 mg via INTRAVENOUS
  Filled 2017-04-03: qty 1

## 2017-04-03 MED ORDER — SODIUM CHLORIDE 0.9 % IV SOLN
12.0000 mg | Freq: Once | INTRAVENOUS | Status: AC
  Administered 2017-04-03: 12 mg via INTRAVENOUS
  Filled 2017-04-03: qty 1.2

## 2017-04-03 MED ORDER — HEPARIN SOD (PORK) LOCK FLUSH 100 UNIT/ML IV SOLN
500.0000 [IU] | Freq: Once | INTRAVENOUS | Status: AC | PRN
  Administered 2017-04-03: 500 [IU]
  Filled 2017-04-03: qty 5

## 2017-04-03 MED ORDER — DEXTROSE 5 % IV SOLN
45.0000 mg/m2 | Freq: Once | INTRAVENOUS | Status: AC
  Administered 2017-04-03: 72 mg via INTRAVENOUS
  Filled 2017-04-03: qty 12

## 2017-04-04 ENCOUNTER — Ambulatory Visit
Admission: RE | Admit: 2017-04-04 | Discharge: 2017-04-04 | Disposition: A | Discharge status: Home / Self Care | Payer: Medicare Other | Source: Ambulatory Visit | Attending: Radiation Oncology | Admitting: Radiation Oncology

## 2017-04-04 ENCOUNTER — Ambulatory Visit: Payer: Medicare Other

## 2017-04-04 DIAGNOSIS — Z510 Encounter for antineoplastic radiation therapy: Secondary | ICD-10-CM | POA: Diagnosis not present

## 2017-04-05 ENCOUNTER — Ambulatory Visit
Admission: RE | Admit: 2017-04-05 | Discharge: 2017-04-05 | Disposition: A | Discharge status: Home / Self Care | Payer: Medicare Other | Source: Ambulatory Visit | Attending: Radiation Oncology | Admitting: Radiation Oncology

## 2017-04-05 ENCOUNTER — Ambulatory Visit: Payer: Medicare Other

## 2017-04-05 DIAGNOSIS — Z510 Encounter for antineoplastic radiation therapy: Secondary | ICD-10-CM | POA: Diagnosis not present

## 2017-04-06 ENCOUNTER — Ambulatory Visit: Payer: Medicare Other

## 2017-04-06 ENCOUNTER — Ambulatory Visit
Admission: RE | Admit: 2017-04-06 | Discharge: 2017-04-06 | Disposition: A | Discharge status: Home / Self Care | Payer: Medicare Other | Source: Ambulatory Visit | Attending: Radiation Oncology | Admitting: Radiation Oncology

## 2017-04-06 DIAGNOSIS — Z510 Encounter for antineoplastic radiation therapy: Secondary | ICD-10-CM | POA: Diagnosis not present

## 2017-04-07 ENCOUNTER — Ambulatory Visit
Admission: RE | Admit: 2017-04-07 | Discharge: 2017-04-07 | Disposition: A | Discharge status: Home / Self Care | Payer: Medicare Other | Source: Ambulatory Visit | Attending: Radiation Oncology | Admitting: Radiation Oncology

## 2017-04-07 ENCOUNTER — Ambulatory Visit: Payer: Medicare Other

## 2017-04-07 DIAGNOSIS — Z510 Encounter for antineoplastic radiation therapy: Secondary | ICD-10-CM | POA: Diagnosis not present

## 2017-04-10 ENCOUNTER — Inpatient Hospital Stay: Payer: Medicare Other

## 2017-04-10 ENCOUNTER — Ambulatory Visit: Payer: Medicare Other

## 2017-04-10 ENCOUNTER — Ambulatory Visit
Admission: RE | Admit: 2017-04-10 | Discharge: 2017-04-10 | Disposition: A | Discharge status: Home / Self Care | Payer: Medicare Other | Source: Ambulatory Visit | Attending: Radiation Oncology | Admitting: Radiation Oncology

## 2017-04-10 ENCOUNTER — Inpatient Hospital Stay (HOSPITAL_BASED_OUTPATIENT_CLINIC_OR_DEPARTMENT_OTHER): Payer: Medicare Other | Admitting: Internal Medicine

## 2017-04-10 VITALS — BP 112/80 | HR 76 | Temp 98.6°F | Resp 16 | Wt 133.2 lb

## 2017-04-10 DIAGNOSIS — Z95828 Presence of other vascular implants and grafts: Secondary | ICD-10-CM

## 2017-04-10 DIAGNOSIS — K5900 Constipation, unspecified: Secondary | ICD-10-CM

## 2017-04-10 DIAGNOSIS — C342 Malignant neoplasm of middle lobe, bronchus or lung: Secondary | ICD-10-CM

## 2017-04-10 DIAGNOSIS — D709 Neutropenia, unspecified: Secondary | ICD-10-CM | POA: Diagnosis not present

## 2017-04-10 DIAGNOSIS — Z510 Encounter for antineoplastic radiation therapy: Secondary | ICD-10-CM | POA: Diagnosis not present

## 2017-04-10 DIAGNOSIS — T451X5S Adverse effect of antineoplastic and immunosuppressive drugs, sequela: Secondary | ICD-10-CM

## 2017-04-10 DIAGNOSIS — Z79899 Other long term (current) drug therapy: Secondary | ICD-10-CM

## 2017-04-10 DIAGNOSIS — Z5111 Encounter for antineoplastic chemotherapy: Secondary | ICD-10-CM | POA: Diagnosis not present

## 2017-04-10 LAB — COMPREHENSIVE METABOLIC PANEL
ALK PHOS: 69 U/L (ref 38–126)
ALT: 32 U/L (ref 14–54)
AST: 34 U/L (ref 15–41)
Albumin: 3.7 g/dL (ref 3.5–5.0)
Anion gap: 6 (ref 5–15)
BUN: 15 mg/dL (ref 6–20)
CO2: 29 mmol/L (ref 22–32)
CREATININE: 0.65 mg/dL (ref 0.44–1.00)
Calcium: 9.1 mg/dL (ref 8.9–10.3)
Chloride: 102 mmol/L (ref 101–111)
GFR calc non Af Amer: >60 mL/min (ref >60)
Glucose, Bld: 85 mg/dL (ref 65–99)
Potassium: 3.8 mmol/L (ref 3.5–5.1)
SODIUM: 137 mmol/L (ref 135–145)
TOTAL PROTEIN: 6.7 g/dL (ref 6.5–8.1)
Total Bilirubin: 0.5 mg/dL (ref 0.3–1.2)

## 2017-04-10 LAB — CBC WITH DIFFERENTIAL/PLATELET
BASOS ABS: 0 10*3/uL (ref 0–0.1)
Basophils Relative: 1 %
Eosinophils Absolute: 0 10*3/uL (ref 0–0.7)
Eosinophils Relative: 1 %
HCT: 31.8 % — ABNORMAL LOW (ref 35.0–47.0)
HEMOGLOBIN: 11 g/dL — AB (ref 12.0–16.0)
LYMPHS ABS: 0.3 10*3/uL — AB (ref 1.0–3.6)
LYMPHS PCT: 29 %
MCH: 32.1 pg (ref 26.0–34.0)
MCHC: 34.5 g/dL (ref 32.0–36.0)
MCV: 92.9 fL (ref 80.0–100.0)
Monocytes Absolute: 0.2 10*3/uL (ref 0.2–0.9)
Monocytes Relative: 18 %
NEUTROS PCT: 51 %
Neutro Abs: 0.5 10*3/uL — ABNORMAL LOW (ref 1.4–6.5)
Platelets: 239 10*3/uL (ref 150–440)
RBC: 3.42 MIL/uL — AB (ref 3.80–5.20)
RDW: 16.4 % — ABNORMAL HIGH (ref 11.5–14.5)
WBC: 1 10*3/uL — AB (ref 3.6–11.0)

## 2017-04-10 MED ORDER — HEPARIN SOD (PORK) LOCK FLUSH 100 UNIT/ML IV SOLN
500.0000 [IU] | Freq: Once | INTRAVENOUS | Status: AC
  Administered 2017-04-10: 500 [IU] via INTRAVENOUS

## 2017-04-10 MED ORDER — HEPARIN SOD (PORK) LOCK FLUSH 100 UNIT/ML IV SOLN
INTRAVENOUS | Status: AC
  Filled 2017-04-10: qty 5

## 2017-04-10 NOTE — Assessment & Plan Note (Addendum)
#  Recurrent STAGE III lung ca [EGFR mutated; positive for adenocarcinoma of the right paratracheal lymph node].   # Currently on concurrent chemoradiation- with carbo- taxol weekly s/p 7 cycles;tolearting well- except  ANC-0.5 /WBC-1; rest- WNL. Hold chemotherapy today. Since the patient will finish radiation on October 12th- we will plan to hold any further chemotherapy. No evidence of pneumonitis- would recommend proceeding with maintenance immunotherapy.   # Given the ongoing chemotherapy /neutropenia -recommend holding Flu shot [hold until Nov 5th 2018].  # Constipation- G-1 miralax prn. Improved.   #HOLD CHEMO today; follow up 8 weeks/port flush; CT scan prior. Will discuss immunotherapy at that time.  

## 2017-04-10 NOTE — Progress Notes (Signed)
[place] OFFICE PROGRESS NOTE  Patient Care Team: [name], MD as PCP - General (Internal Medicine)  Cancer Staging No matching staging information was found for the patient.   Oncology History   # July-AUG 2018- RECURRENT STAGE III Ad[place] Ca [EBUS; Right paratrach LN [Dr.Ram & Dr.Oaks] insuff tissue for testing; check liquid Bx]; PET- no distant mets.  # AUG 13th 2018- Carbo-Taxol with RT [finish oct 12th 2018]  ------------------------------------------------------------------------------   # MAY 2015- .Marland KitchenA right middle lobe adenocarcinoma s/p resection [Dr.Oaks].  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  may of 2015 4.Patient has finished 4 cycles of chemotherapy on January 23, 2014 Now patient would be randomized   to  our [facility] protocol for Tarceva vs. placebo because of EGFR mutation 3.patient desires to get off protocol because of progressive side effect even after reducing dose.  (May 13, 2014) 5.Patient is starting  maintenance investigationally approach with Tarceva vs. placebo September of 2015. 6.Patient came off protocol therapy because of significant GI side effect May 13, 2014   Patient is nonsmoker;        Primary cancer of right middle lobe of lung ([place])     INTERVAL HISTORY:  Lauren Page 66 y.o.  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 Patient will finish radiation end of this week.  Patient denies any fevers or chills. Denies any abdominal pain. Denies any sores in the mouth.  Denies any weight loss. She denies any headaches. No significant tingling or numbness. No skin rash.   REVIEW OF SYSTEMS:  A complete 10 point review of system is done which is negative except mentioned above/history of  present illness.   PAST MEDICAL HISTORY :  Past Medical History:  Diagnosis Date  . Acid reflux   . Anemia    HAD TO HAVE IRON INFUSIONS BACK IN 2015  . Arthritis    bil hands and back  . Asthma   . Cancer of right lung ([place]) 2015   lung cancer - chemo, Dr Genevive Bi removed RML Lobectomy.   Marland Kitchen Dyspnea    WITH EXERTION  . Hypertension   . Mitral valve prolapse     PAST SURGICAL HISTORY :   Past Surgical History:  Procedure Laterality Date  . ABDOMINAL HYSTERECTOMY    . BREAST BIOPSY Left 1994 (-), 2004 (-)  . COLONOSCOPY  2008    Dr. Donnella Sham  . KNEE SURGERY  2007  . LUNG LOBECTOMY Right    middle lobe  . PORTACATH PLACEMENT Right 02/09/2017   Procedure: INSERTION PORT-A-CATH;  Surgeon: Nestor Lewandowsky, MD;  Location: [facility] ORS;  Service: General;  Laterality: Right;  . SALIVARY GLAND SURGERY Left 1983   with lymph node removal also  . UPPER GI ENDOSCOPY  2008  . VIDEO BRONCHOSCOPY WITH ENDOBRONCHIAL ULTRASOUND Right 01/24/2017   Procedure: VIDEO BRONCHOSCOPY WITH ENDOBRONCHIAL ULTRASOUND;  Surgeon: [name], MD;  Location: [facility] ORS;  Service: Pulmonary;  Laterality: Right;    FAMILY HISTORY :   Family History  Problem Relation Age of Onset  . Breast cancer Maternal Aunt   . Breast cancer Paternal Aunt   . Breast cancer Maternal Grandmother   . Breast cancer Maternal Aunt   . Breast cancer Paternal Aunt   . Healthy Mother   .  Healthy Father     SOCIAL HISTORY:   Social History  Substance Use Topics  . Smoking status: Never Smoker  . Smokeless tobacco: Never Used  . Alcohol use 4.2 oz/week    7 Glasses of wine per week    ALLERGIES:  is allergic to penicillins and sulfa antibiotics.  MEDICATIONS:  Current Outpatient Prescriptions  Medication Sig Dispense Refill  . albuterol (PROVENTIL HFA;VENTOLIN HFA) 108 (90 Base) MCG/ACT inhaler Inhale 1-2 puffs into the lungs every 6 (six) hours as needed for wheezing or shortness of breath.    . B Complex Vitamins  (VITAMIN B COMPLEX PO) Take 1 tablet by mouth daily.     . Biotin w/ Vitamins C & E (HAIR/SKIN/NAILS PO) Take 1 tablet by mouth daily.    . Calcium Carb-Cholecalciferol (CALCIUM 600+D) 600-800 MG-UNIT TABS Take 2 tablets by mouth daily.    . Cholecalciferol (D3 MAXIMUM STRENGTH) 5000 units capsule Take 5,000 Units by mouth daily.    . COLLAGEN PO Take 1,000 mg by mouth daily.    . cyclobenzaprine (FLEXERIL) 5 MG tablet Take 5 mg by mouth 3 (three) times daily as needed for muscle spasms.    . fexofenadine (ALLEGRA) 180 MG tablet Take 180 mg by mouth daily as needed for allergies.     . fluticasone (FLONASE) 50 MCG/ACT nasal spray Place 2 sprays into both nostrils daily as needed for allergies or rhinitis.    . Fluticasone-Salmeterol (ADVAIR) 250-50 MCG/DOSE AEPB Inhale 1 puff into the lungs 2 (two) times daily as needed (for respiratory issues.).    Marland Kitchen lansoprazole (PREVACID) 30 MG capsule Take 30 mg by mouth 2 (two) times daily before a meal.     . lidocaine-prilocaine (EMLA) cream Apply 1 application topically as needed. Apply generously over the Mediport 45 minutes prior to chemotherapy. 30 g 0  . losartan (COZAAR) 25 MG tablet Take 25 mg by mouth at bedtime.     . metoCLOPramide (REGLAN) 10 MG tablet Take 1 tablet (10 mg total) by mouth every 8 (eight) hours as needed for nausea. 40 tablet 3  . ondansetron (ZOFRAN) 8 MG tablet Take 1 tablet (8 mg total) by mouth every 8 (eight) hours as needed for nausea or vomiting (start 3 days; after chemo). 40 tablet 1  . polyethylene glycol (MIRALAX / GLYCOLAX) packet Take 17 g by mouth daily as needed for mild constipation.    . prochlorperazine (COMPAZINE) 10 MG tablet Take 1 tablet (10 mg total) by mouth every 6 (six) hours as needed for nausea or vomiting. 40 tablet 1  . traZODone (DESYREL) 50 MG tablet Take 1 tablet by mouth 1 day or 1 dose.     No current facility-administered medications for this visit.     PHYSICAL EXAMINATION: ECOG PERFORMANCE  STATUS: 0 - Asymptomatic  BP 112/80 (BP Location: Left Arm, Patient Position: Sitting)   Pulse 76   Temp 98.6 F (37 C) (Tympanic)   Resp 16   Wt 133 lb 3.2 oz (60.4 kg)   BMI 22.86 kg/m   Filed Weights   04/10/17 0936  Weight: 133 lb 3.2 oz (60.4 kg)    GENERAL: Well-nourished well-developed; Alert, no distress and comfortable. She is alone. EYES: no pallor or icterus OROPHARYNX: no thrush or ulceration; good dentition  NECK: supple, no masses felt LYMPH:  no palpable lymphadenopathy in the cervical, axillary or inguinal regions LUNGS: clear to auscultation and  No wheeze or crackles HEART/CVS: regular rate & rhythm and no murmurs;  No lower extremity edema ABDOMEN:abdomen soft, non-tender and normal bowel sounds Musculoskeletal:no cyanosis of digits and no clubbing  PSYCH: alert & oriented x 3 with fluent speech NEURO: no focal motor/sensory deficits SKIN:  no rashes or significant lesions  LABORATORY DATA:  I have reviewed the data as listed    Component Value Date/Time   NA 137 04/10/2017 0921   NA 141 06/03/2014 1039   K 3.8 04/10/2017 0921   K 4.1 06/03/2014 1039   CL 102 04/10/2017 0921   CL 105 06/03/2014 1039   CO2 29 04/10/2017 0921   CO2 28 06/03/2014 1039   GLUCOSE 85 04/10/2017 0921   GLUCOSE 88 06/03/2014 1039   BUN 15 04/10/2017 0921   BUN 14 06/03/2014 1039   CREATININE 0.65 04/10/2017 0921   CREATININE 0.68 06/03/2014 1039   CALCIUM 9.1 04/10/2017 0921   CALCIUM 8.7 06/03/2014 1039   PROT 6.7 04/10/2017 0921   PROT 7.6 06/03/2014 1039   ALBUMIN 3.7 04/10/2017 0921   ALBUMIN 3.7 06/03/2014 1039   AST 34 04/10/2017 0921   AST 36 06/03/2014 1039   ALT 32 04/10/2017 0921   ALT 34 06/03/2014 1039   ALKPHOS 69 04/10/2017 0921   ALKPHOS 86 06/03/2014 1039   BILITOT 0.5 04/10/2017 0921   BILITOT 0.4 06/03/2014 1039   GFRNONAA >60 04/10/2017 0921   GFRNONAA >60 06/03/2014 1039   GFRNONAA >60 03/20/2014 1109   GFRAA >60 04/10/2017 0921   GFRAA  >60 06/03/2014 1039   GFRAA >60 03/20/2014 1109    No results found for: SPEP, UPEP  Lab Results  Component Value Date   WBC 1.0 (LL) 04/10/2017   NEUTROABS 0.5 (L) 04/10/2017   HGB 11.0 (L) 04/10/2017   HCT 31.8 (L) 04/10/2017   MCV 92.9 04/10/2017   PLT 239 04/10/2017      Chemistry      Component Value Date/Time   NA 137 04/10/2017 0921   NA 141 06/03/2014 1039   K 3.8 04/10/2017 0921   K 4.1 06/03/2014 1039   CL 102 04/10/2017 0921   CL 105 06/03/2014 1039   CO2 29 04/10/2017 0921   CO2 28 06/03/2014 1039   BUN 15 04/10/2017 0921   BUN 14 06/03/2014 1039   CREATININE 0.65 04/10/2017 0921   CREATININE 0.68 06/03/2014 1039      Component Value Date/Time   CALCIUM 9.1 04/10/2017 0921   CALCIUM 8.7 06/03/2014 1039   ALKPHOS 69 04/10/2017 0921   ALKPHOS 86 06/03/2014 1039   AST 34 04/10/2017 0921   AST 36 06/03/2014 1039   ALT 32 04/10/2017 0921   ALT 34 06/03/2014 1039   BILITOT 0.5 04/10/2017 0921   BILITOT 0.4 06/03/2014 1039     IMPRESSION: 1. Interval increase in size of bilateral hilar lymph nodes. The right hilar node now measures 1.6 cm. 2. Stable index nodule within the superior segment of right lower lobe. Similar appearance of subpleural nodularity overlying the right lung and extending along the major fissure. 3.  Aortic Atherosclerosis (ICD10-I70.0).   Electronically Signed   By: Kerby Moors M.D.   On: 12/16/2016 13:18  RADIOGRAPHIC STUDIES: I have personally reviewed the radiological images as listed and agreed with the findings in the report. No results found.   ASSESSMENT & PLAN:  Primary cancer of right middle lobe of lung ([place]) # Recurrent STAGE III lung ca [EGFR mutated; positive for adenocarcinoma of the right paratracheal lymph node].   # Currently on concurrent chemoradiation- with  carbo- taxol weekly s/p 7 cycles;tolearting well- except  ANC-0.5 /WBC-1; rest- WNL. Hold chemotherapy today. Since the patient will finish  radiation on October 12th- we will plan to hold any further chemotherapy. No evidence of pneumonitis- would recommend proceeding with maintenance immunotherapy.   # Given the ongoing chemotherapy /neutropenia -recommend holding Flu shot [hold until Nov 5th 2018].  # Constipation- G-1 miralax prn. Improved.   #HOLD CHEMO today; follow up 8 weeks/port flush; CT scan prior. Will discuss immunotherapy at that time.    Orders Placed This Encounter  Procedures  . CT CHEST W CONTRAST    Standing Status:   Future    Standing Expiration Date:   04/10/2018    Order Specific Question:   If indicated for the ordered procedure, I authorize the administration of contrast media per Radiology protocol    Answer:   Yes    Order Specific Question:   Preferred imaging location?    Answer:   [facility]    Order Specific Question:   Radiology Contrast Protocol - do NOT remove file path    Answer:   [email or web address]    Order Specific Question:   Reason for Exam additional comments    Answer:   lung cancer  . CBC with Differential/Platelet    Standing Status:   Future    Standing Expiration Date:   04/10/2018  . Comprehensive metabolic panel    Standing Status:   Future    Standing Expiration Date:   04/10/2018   All questions were answered. The patient knows to call the clinic with any problems, questions or concerns.      Cammie Sickle, MD 04/10/2017 1:47 PM

## 2017-04-11 ENCOUNTER — Other Ambulatory Visit: Payer: Self-pay | Admitting: *Deleted

## 2017-04-11 ENCOUNTER — Ambulatory Visit
Admission: RE | Admit: 2017-04-11 | Discharge: 2017-04-11 | Disposition: A | Discharge status: Home / Self Care | Payer: Medicare Other | Source: Ambulatory Visit | Attending: Radiation Oncology | Admitting: Radiation Oncology

## 2017-04-11 ENCOUNTER — Ambulatory Visit: Payer: Medicare Other

## 2017-04-11 DIAGNOSIS — Z510 Encounter for antineoplastic radiation therapy: Secondary | ICD-10-CM | POA: Diagnosis not present

## 2017-04-11 MED ORDER — AZITHROMYCIN 250 MG PO TABS: ORAL_TABLET | ORAL | 0 refills | Status: DC

## 2017-04-12 ENCOUNTER — Ambulatory Visit
Admission: RE | Admit: 2017-04-12 | Discharge: 2017-04-12 | Disposition: A | Discharge status: Home / Self Care | Payer: Medicare Other | Source: Ambulatory Visit | Attending: Radiation Oncology | Admitting: Radiation Oncology

## 2017-04-12 ENCOUNTER — Ambulatory Visit: Payer: Medicare Other

## 2017-04-12 DIAGNOSIS — Z510 Encounter for antineoplastic radiation therapy: Secondary | ICD-10-CM | POA: Diagnosis not present

## 2017-04-13 ENCOUNTER — Ambulatory Visit: Payer: Medicare Other

## 2017-04-13 ENCOUNTER — Ambulatory Visit
Admission: RE | Admit: 2017-04-13 | Discharge: 2017-04-13 | Disposition: A | Discharge status: Home / Self Care | Payer: Medicare Other | Source: Ambulatory Visit | Attending: Radiation Oncology | Admitting: Radiation Oncology

## 2017-04-13 DIAGNOSIS — Z510 Encounter for antineoplastic radiation therapy: Secondary | ICD-10-CM | POA: Diagnosis not present

## 2017-04-14 ENCOUNTER — Ambulatory Visit
Admission: RE | Admit: 2017-04-14 | Discharge: 2017-04-14 | Disposition: A | Discharge status: Home / Self Care | Payer: Medicare Other | Source: Ambulatory Visit | Attending: Radiation Oncology | Admitting: Radiation Oncology

## 2017-04-14 DIAGNOSIS — Z510 Encounter for antineoplastic radiation therapy: Secondary | ICD-10-CM | POA: Diagnosis not present

## 2017-05-11 ENCOUNTER — Other Ambulatory Visit: Payer: Self-pay | Admitting: *Deleted

## 2017-05-11 DIAGNOSIS — C801 Malignant (primary) neoplasm, unspecified: Secondary | ICD-10-CM

## 2017-05-11 DIAGNOSIS — C342 Malignant neoplasm of middle lobe, bronchus or lung: Secondary | ICD-10-CM

## 2017-05-18 ENCOUNTER — Ambulatory Visit: Payer: Medicare Other | Admitting: Radiation Oncology

## 2017-05-22 ENCOUNTER — Ambulatory Visit
Admission: RE | Admit: 2017-05-22 | Discharge: 2017-05-22 | Disposition: A | Discharge status: Home / Self Care | Payer: Medicare Other | Source: Ambulatory Visit | Attending: Internal Medicine | Admitting: Internal Medicine

## 2017-05-22 DIAGNOSIS — J90 Pleural effusion, not elsewhere classified: Secondary | ICD-10-CM | POA: Diagnosis not present

## 2017-05-22 DIAGNOSIS — R590 Localized enlarged lymph nodes: Secondary | ICD-10-CM | POA: Insufficient documentation

## 2017-05-22 DIAGNOSIS — C342 Malignant neoplasm of middle lobe, bronchus or lung: Secondary | ICD-10-CM

## 2017-05-22 DIAGNOSIS — I700 Atherosclerosis of aorta: Secondary | ICD-10-CM | POA: Diagnosis not present

## 2017-05-22 DIAGNOSIS — R918 Other nonspecific abnormal finding of lung field: Secondary | ICD-10-CM | POA: Insufficient documentation

## 2017-05-22 MED ORDER — IOPAMIDOL (ISOVUE-300) INJECTION 61%
75.0000 mL | Freq: Once | INTRAVENOUS | Status: AC | PRN
  Administered 2017-05-22: 75 mL via INTRAVENOUS

## 2017-05-23 ENCOUNTER — Telehealth: Payer: Self-pay | Admitting: Internal Medicine

## 2017-05-23 MED ORDER — PREDNISONE 20 MG PO TABS: ORAL_TABLET | ORAL | 0 refills | Status: DC

## 2017-05-23 MED ORDER — LEVOFLOXACIN 500 MG PO TABS: 500.0000 mg | ORAL_TABLET | Freq: Every day | ORAL | 0 refills | Status: DC

## 2017-05-23 NOTE — Telephone Encounter (Signed)
Spoke to the results of the CT scan; right lower lobe inflammation versus infection.  The patient symptomatic worsening cough shortness of breath no fevers.  Start patient on Levaquin 500 mg a day; prednisone 60 mg a day.  Recommended to keep appointments as planned.

## 2017-05-24 ENCOUNTER — Encounter: Payer: Self-pay | Admitting: Radiation Oncology

## 2017-05-24 ENCOUNTER — Ambulatory Visit
Admission: RE | Admit: 2017-05-24 | Discharge: 2017-05-24 | Disposition: A | Discharge status: Home / Self Care | Payer: Medicare Other | Source: Ambulatory Visit | Attending: Radiation Oncology | Admitting: Radiation Oncology

## 2017-05-24 ENCOUNTER — Ambulatory Visit: Payer: Medicare Other | Admitting: Radiation Oncology

## 2017-05-24 ENCOUNTER — Other Ambulatory Visit: Payer: Self-pay

## 2017-05-24 VITALS — BP 134/84 | HR 94 | Temp 97.2°F | Wt 133.2 lb

## 2017-05-24 DIAGNOSIS — Z9221 Personal history of antineoplastic chemotherapy: Secondary | ICD-10-CM | POA: Insufficient documentation

## 2017-05-24 DIAGNOSIS — Z923 Personal history of irradiation: Secondary | ICD-10-CM | POA: Diagnosis not present

## 2017-05-24 DIAGNOSIS — R05 Cough: Secondary | ICD-10-CM | POA: Diagnosis not present

## 2017-05-24 DIAGNOSIS — C342 Malignant neoplasm of middle lobe, bronchus or lung: Secondary | ICD-10-CM | POA: Diagnosis present

## 2017-05-24 NOTE — Progress Notes (Signed)
Radiation Oncology Follow up Note  Name: Lauren Page   Date:   05/24/2017 MRN:  [id number] DOB: Apr 09, 1951    This 66 y.o. female presents to the clinic today for one-month follow-up status post biopsy-proven recurrence of adenocarcinoma the right middle lobe status post right middle lobectomy in May 2015.  REFERRING PROVIDER: [name], MD  HPI: Patient is a. 66 year old female now out 1 month having completed concurrent chemoradiation for a recurrence of adenocarcinoma in the right middle lobe status post right middle lobe resection for a T2 N1 stage IIIa adenocarcinoma back in May 2015. She is seen today in routine follow-up and is doing well. She does have a persistent cough with seems to be stable. She specifically denies increased shortness of breath dyspnea exertion or hemoptysis. She recently had a CT scan showing interval decrease in might mild right hilar adenopathy. There is some groundglass areas in the superior right lower lobe which may be infectious in nature she's been started on Levaquin as well as steroids.  COMPLICATIONS OF TREATMENT: none  FOLLOW UP COMPLIANCE: keeps appointments   PHYSICAL EXAM:  BP 134/84   Pulse 94   Temp (!) 97.2 F (36.2 C)   Wt 133 lb 2.5 oz (60.4 kg)   BMI 22.86 kg/m  Well-developed well-nourished patient in NAD. HEENT reveals PERLA, EOMI, discs not visualized.  Oral cavity is clear. No oral mucosal lesions are identified. Neck is clear without evidence of cervical or supraclavicular adenopathy. Lungs are clear to A&P. Cardiac examination is essentially unremarkable with regular rate and rhythm without murmur rub or thrill. Abdomen is benign with no organomegaly or masses noted. Motor sensory and DTR levels are equal and symmetric in the upper and lower extremities. Cranial nerves II through XII are grossly intact. Proprioception is intact. No peripheral adenopathy or edema is identified. No motor or sensory levels are noted. Crude visual  fields are within normal range.  RADIOLOGY RESULTS: CT scans are reviewed  PLAN: At the present time she is doing well. CT scan shows good response in the chest. She scheduled to start maintenance immunotherapy under medical oncology's direction. I am please were overall progress. I've asked to see her back in 4-5 months for follow-up. Patient knows to call sooner with any concerns.  I would like to take this opportunity to thank you for allowing me to participate in the care of your patient.Armstead Peaks., MD

## 2017-05-29 ENCOUNTER — Other Ambulatory Visit: Payer: Medicare Other

## 2017-05-29 ENCOUNTER — Inpatient Hospital Stay: Payer: Medicare Other

## 2017-05-29 ENCOUNTER — Ambulatory Visit: Payer: Medicare Other | Admitting: Radiation Oncology

## 2017-05-29 ENCOUNTER — Inpatient Hospital Stay: Payer: Medicare Other | Attending: Oncology | Admitting: Oncology

## 2017-05-29 VITALS — BP 129/90 | HR 80 | Temp 98.2°F | Resp 18 | Wt 134.1 lb

## 2017-05-29 DIAGNOSIS — Z9221 Personal history of antineoplastic chemotherapy: Secondary | ICD-10-CM | POA: Insufficient documentation

## 2017-05-29 DIAGNOSIS — Z923 Personal history of irradiation: Secondary | ICD-10-CM | POA: Diagnosis not present

## 2017-05-29 DIAGNOSIS — R590 Localized enlarged lymph nodes: Secondary | ICD-10-CM | POA: Diagnosis not present

## 2017-05-29 DIAGNOSIS — J45909 Unspecified asthma, uncomplicated: Secondary | ICD-10-CM | POA: Insufficient documentation

## 2017-05-29 DIAGNOSIS — J90 Pleural effusion, not elsewhere classified: Secondary | ICD-10-CM | POA: Diagnosis not present

## 2017-05-29 DIAGNOSIS — K219 Gastro-esophageal reflux disease without esophagitis: Secondary | ICD-10-CM | POA: Insufficient documentation

## 2017-05-29 DIAGNOSIS — C801 Malignant (primary) neoplasm, unspecified: Secondary | ICD-10-CM

## 2017-05-29 DIAGNOSIS — I341 Nonrheumatic mitral (valve) prolapse: Secondary | ICD-10-CM | POA: Diagnosis not present

## 2017-05-29 DIAGNOSIS — C342 Malignant neoplasm of middle lobe, bronchus or lung: Secondary | ICD-10-CM | POA: Insufficient documentation

## 2017-05-29 DIAGNOSIS — I10 Essential (primary) hypertension: Secondary | ICD-10-CM | POA: Diagnosis not present

## 2017-05-29 DIAGNOSIS — Z95828 Presence of other vascular implants and grafts: Secondary | ICD-10-CM

## 2017-05-29 DIAGNOSIS — I700 Atherosclerosis of aorta: Secondary | ICD-10-CM | POA: Insufficient documentation

## 2017-05-29 DIAGNOSIS — Z7189 Other specified counseling: Secondary | ICD-10-CM

## 2017-05-29 DIAGNOSIS — C3490 Malignant neoplasm of unspecified part of unspecified bronchus or lung: Secondary | ICD-10-CM

## 2017-05-29 DIAGNOSIS — Z79899 Other long term (current) drug therapy: Secondary | ICD-10-CM | POA: Diagnosis not present

## 2017-05-29 LAB — COMPREHENSIVE METABOLIC PANEL
ALBUMIN: 3.5 g/dL (ref 3.5–5.0)
ALK PHOS: 67 U/L (ref 38–126)
ALT: 23 U/L (ref 14–54)
ANION GAP: 8 (ref 5–15)
AST: 30 U/L (ref 15–41)
BILIRUBIN TOTAL: 0.7 mg/dL (ref 0.3–1.2)
BUN: 16 mg/dL (ref 6–20)
CALCIUM: 8.9 mg/dL (ref 8.9–10.3)
CO2: 27 mmol/L (ref 22–32)
CREATININE: 0.72 mg/dL (ref 0.44–1.00)
Chloride: 102 mmol/L (ref 101–111)
GFR calc non Af Amer: >60 mL/min (ref >60)
GLUCOSE: 103 mg/dL — AB (ref 65–99)
Potassium: 3.6 mmol/L (ref 3.5–5.1)
Sodium: 137 mmol/L (ref 135–145)
TOTAL PROTEIN: 6.9 g/dL (ref 6.5–8.1)

## 2017-05-29 LAB — CBC WITH DIFFERENTIAL/PLATELET
BASOS ABS: 0 10*3/uL (ref 0–0.1)
BASOS PCT: 0 %
EOS ABS: 0 10*3/uL (ref 0–0.7)
Eosinophils Relative: 0 %
HEMATOCRIT: 33.6 % — AB (ref 35.0–47.0)
Hemoglobin: 11.5 g/dL — ABNORMAL LOW (ref 12.0–16.0)
Lymphocytes Relative: 7 %
Lymphs Abs: 0.3 10*3/uL — ABNORMAL LOW (ref 1.0–3.6)
MCH: 32.5 pg (ref 26.0–34.0)
MCHC: 34.1 g/dL (ref 32.0–36.0)
MCV: 95.4 fL (ref 80.0–100.0)
MONO ABS: 0.2 10*3/uL (ref 0.2–0.9)
Monocytes Relative: 4 %
NEUTROS ABS: 4.4 10*3/uL (ref 1.4–6.5)
Neutrophils Relative %: 89 %
PLATELETS: 331 10*3/uL (ref 150–440)
RBC: 3.53 MIL/uL — ABNORMAL LOW (ref 3.80–5.20)
RDW: 15 % — AB (ref 11.5–14.5)
WBC: 5 10*3/uL (ref 3.6–11.0)

## 2017-05-29 MED ORDER — SODIUM CHLORIDE 0.9% FLUSH
10.0000 mL | INTRAVENOUS | Status: AC | PRN
  Administered 2017-05-29: 10 mL
  Filled 2017-05-29: qty 10

## 2017-05-29 MED ORDER — HEPARIN SOD (PORK) LOCK FLUSH 100 UNIT/ML IV SOLN
500.0000 [IU] | INTRAVENOUS | Status: AC | PRN
  Administered 2017-05-29: 500 [IU]

## 2017-05-31 ENCOUNTER — Encounter: Payer: Self-pay | Admitting: Oncology

## 2017-05-31 DIAGNOSIS — Z7189 Other specified counseling: Secondary | ICD-10-CM | POA: Insufficient documentation

## 2017-05-31 NOTE — Progress Notes (Signed)
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  INTERVAL HISTORY: Patient returns to clinic today for further evaluation, discussion of her CT scan results, and treatment planning.  She was most recently seen by another MD, but has asked to switch providers.  She is anxious, but otherwise feels well.  She has no neurologic complaints.  She denies any recent fevers or illnesses.  She has good appetite and denies weight loss.  She has no chest pain, cough, hemoptysis, or shortness of breath.  She denies any nausea, vomiting, constipation, or diarrhea.  She has no urinary complaints.  Patient feels at her baseline offers no further specific complaints today.  REVIEW OF SYSTEMS:   Review of Systems  Constitutional: Negative.  Negative for fever, malaise/fatigue and weight loss.  Respiratory: Negative.  Negative for cough, hemoptysis and shortness of breath.   Cardiovascular: Negative.  Negative for chest pain and leg swelling.  Gastrointestinal: Negative.  Negative for abdominal pain.  Genitourinary: Negative.   Musculoskeletal: Negative.   Skin: Negative.  Negative for rash.  Neurological: Negative.  Negative for sensory change, focal weakness and weakness.  Psychiatric/Behavioral: The patient is nervous/anxious.     As per HPI. Otherwise, a complete review of systems is negative.  PAST MEDICAL HISTORY: Past Medical History:  Diagnosis Date  . Acid reflux   . Anemia    HAD TO HAVE IRON INFUSIONS BACK IN 2015  . Arthritis    bil hands and back  . Asthma   . Cancer of right lung ([place]) 2015   lung cancer - chemo, Dr Genevive Bi removed RML Lobectomy.   Marland Kitchen Dyspnea    WITH EXERTION  . Hypertension   . Mitral valve prolapse     PAST SURGICAL  HISTORY: Past Surgical History:  Procedure Laterality Date  . ABDOMINAL HYSTERECTOMY    . BREAST BIOPSY Left 1994 (-), 2004 (-)  . COLONOSCOPY  2008    Dr. Donnella Sham  . KNEE SURGERY  2007  . LUNG LOBECTOMY Right    middle lobe  . PORTACATH PLACEMENT Right 02/09/2017   Procedure: INSERTION PORT-A-CATH;  Surgeon: Nestor Lewandowsky, MD;  Location: [facility] ORS;  Service: General;  Laterality: Right;  . SALIVARY GLAND SURGERY Left 1983   with lymph node removal also  . UPPER GI ENDOSCOPY  2008  . VIDEO BRONCHOSCOPY WITH ENDOBRONCHIAL ULTRASOUND Right 01/24/2017   Procedure: VIDEO BRONCHOSCOPY WITH ENDOBRONCHIAL ULTRASOUND;  Surgeon: [name], MD;  Location: [facility] ORS;  Service: Pulmonary;  Laterality: Right;    FAMILY HISTORY: Family History  Problem Relation Age of Onset  . Breast cancer Maternal Aunt   . Breast cancer Paternal Aunt   . Breast cancer Maternal Grandmother   . Breast cancer Maternal Aunt   . Breast cancer Paternal Aunt   . Healthy Mother   . Healthy Father     ADVANCED DIRECTIVES (Y/N):  N  HEALTH MAINTENANCE: Social History   Tobacco Use  . Smoking status: Never Smoker  . Smokeless tobacco: Never Used  Substance Use Topics  . Alcohol use: Yes    Alcohol/week: 4.2 oz    Types: 7 Glasses of wine per week  . Drug use: No     Colonoscopy:  PAP:  Bone density:  Lipid panel:  Allergies  Allergen Reactions  .  Penicillins Swelling and Rash    Has patient had a PCN reaction causing immediate rash, facial/tongue/throat swelling, SOB or lightheadedness with hypotension: Unknown Has patient had a PCN reaction causing severe rash involving mucus membranes or skin necrosis: Unknown Has patient had a PCN reaction that required hospitalization: No Has patient had a PCN reaction occurring within the last 10 years: No If all of the above answers are "NO", then may proceed with Cephalosporin use.    . Sulfa Antibiotics Nausea And Vomiting    Current Outpatient  Medications  Medication Sig Dispense Refill  . B Complex Vitamins (VITAMIN B COMPLEX PO) Take 1 tablet by mouth daily.     . Biotin w/ Vitamins C & E (HAIR/SKIN/NAILS PO) Take 1 tablet by mouth daily.    . Calcium Carb-Cholecalciferol (CALCIUM 600+D) 600-800 MG-UNIT TABS Take 2 tablets by mouth daily.    . Cholecalciferol (D3 MAXIMUM STRENGTH) 5000 units capsule Take 5,000 Units by mouth daily.    . COLLAGEN PO Take 1,000 mg by mouth daily.    . cyclobenzaprine (FLEXERIL) 5 MG tablet Take 5 mg by mouth 3 (three) times daily as needed for muscle spasms.    . fexofenadine (ALLEGRA) 180 MG tablet Take 180 mg by mouth daily as needed for allergies.     . fluticasone (FLONASE) 50 MCG/ACT nasal spray Place 2 sprays into both nostrils daily as needed for allergies or rhinitis.    . Fluticasone-Salmeterol (ADVAIR) 250-50 MCG/DOSE AEPB Inhale 1 puff into the lungs 2 (two) times daily as needed (for respiratory issues.).    Marland Kitchen lansoprazole (PREVACID) 30 MG capsule Take 30 mg by mouth 2 (two) times daily before a meal.     . levofloxacin (LEVAQUIN) 500 MG tablet Take 1 tablet (500 mg total) by mouth daily. 10 tablet 0  . lidocaine-prilocaine (EMLA) cream Apply 1 application topically as needed. Apply generously over the Mediport 45 minutes prior to chemotherapy. 30 g 0  . losartan (COZAAR) 25 MG tablet Take 25 mg by mouth at bedtime.     . polyethylene glycol (MIRALAX / GLYCOLAX) packet Take 17 g by mouth daily as needed for mild constipation.    . predniSONE (DELTASONE) 20 MG tablet Take 3 pills [60 mg] once a day with food; preferably in the morning.  Will taper based on response 40 tablet 0  . albuterol (PROVENTIL HFA;VENTOLIN HFA) 108 (90 Base) MCG/ACT inhaler Inhale 1-2 puffs into the lungs every 6 (six) hours as needed for wheezing or shortness of breath.    . metoCLOPramide (REGLAN) 10 MG tablet Take 1 tablet (10 mg total) by mouth every 8 (eight) hours as needed for nausea. (Patient not taking:  Reported on 05/29/2017) 40 tablet 3  . ondansetron (ZOFRAN) 8 MG tablet Take 1 tablet (8 mg total) by mouth every 8 (eight) hours as needed for nausea or vomiting (start 3 days; after chemo). (Patient not taking: Reported on 05/29/2017) 40 tablet 1  . prochlorperazine (COMPAZINE) 10 MG tablet Take 1 tablet (10 mg total) by mouth every 6 (six) hours as needed for nausea or vomiting. (Patient not taking: Reported on 05/29/2017) 40 tablet 1  . traZODone (DESYREL) 50 MG tablet Take 1 tablet by mouth 1 day or 1 dose.     No current facility-administered medications for this visit.     OBJECTIVE: Vitals:   05/29/17 1106  BP: 129/90  Pulse: 80  Resp: 18  Temp: 98.2 F (36.8 C)     Body mass index  is 23.02 kg/m.    ECOG FS:0 - Asymptomatic  General: Well-developed, well-nourished, no acute distress. Eyes: Pink conjunctiva, anicteric sclera. HEENT: Normocephalic, moist mucous membranes, clear oropharnyx. Lungs: Clear to auscultation bilaterally. Heart: Regular rate and rhythm. No rubs, murmurs, or gallops. Abdomen: Soft, nontender, nondistended. No organomegaly noted, normoactive bowel sounds. Musculoskeletal: No edema, cyanosis, or clubbing. Neuro: Alert, answering all questions appropriately. Cranial nerves grossly intact. Skin: No rashes or petechiae noted. Psych: Normal affect. Lymphatics: No cervical, calvicular, axillary or inguinal LAD.   LAB RESULTS:  Lab Results  Component Value Date   NA 137 05/29/2017   K 3.6 05/29/2017   CL 102 05/29/2017   CO2 27 05/29/2017   GLUCOSE 103 (H) 05/29/2017   BUN 16 05/29/2017   CREATININE 0.72 05/29/2017   CALCIUM 8.9 05/29/2017   PROT 6.9 05/29/2017   ALBUMIN 3.5 05/29/2017   AST 30 05/29/2017   ALT 23 05/29/2017   ALKPHOS 67 05/29/2017   BILITOT 0.7 05/29/2017   GFRNONAA >60 05/29/2017   GFRAA >60 05/29/2017    Lab Results  Component Value Date   WBC 5.0 05/29/2017   NEUTROABS 4.4 05/29/2017   HGB 11.5 (L) 05/29/2017   HCT  33.6 (L) 05/29/2017   MCV 95.4 05/29/2017   PLT 331 05/29/2017     STUDIES: Ct Chest W Contrast  Result Date: 05/22/2017 CLINICAL DATA:  Followup right middle lobe lung carcinoma. Recently completed chemotherapy. Restaging. EXAM: CT CHEST WITH CONTRAST TECHNIQUE: Multidetector CT imaging of the chest was performed during intravenous contrast administration. CONTRAST:  71m ISOVUE-300 IOPAMIDOL (ISOVUE-300) INJECTION 61% COMPARISON:  12/16/2016 FINDINGS: Cardiovascular:  No acute findings. Aortic atherosclerosis. Mediastinum/Nodes: 8 mm right paratracheal lymph node is stable. Interval decrease in size of right hilar lymph node, currently measuring 11 mm on image 61/ 2 compared to 16 mm previously. No new or increased lymphadenopathy identified. Lungs/Pleura: New airspace disease is seen in the superior right lower lobe, which obscures the previously seen pulmonary nodule at this site. New 11 mm focal ground-glass nodule in the posterior right upper lobe on image 71/3. These findings are most consistent with infectious or inflammatory etiology. Stable 7 mm sub-solid nodule in the anterior right lower lobe on image 86/3. New small right pleural effusion. Upper Abdomen:  Unremarkable. Musculoskeletal: No suspicious bone lesions. Old fracture deformity of right lateral fifth rib is stable in appearance. IMPRESSION: Interval decrease in mild right hilar lymphadenopathy. New airspace disease in superior right lower lobe and 11 mm ground-glass nodule in posterior right upper lobe, most consistent with infectious or inflammatory etiology. New small right pleural effusion. Recommend clinical correlation, and continued attention on follow-up imaging. Stable 7 mm sub-solid pulmonary nodule in anterior right lower lobe. Recommend continued attention on follow-up imaging. Aortic Atherosclerosis (ICD10-I70.0). Electronically Signed   By: JEarle GellM.D.   On: 05/22/2017 12:44    ASSESSMENT: Recurrent stage III  adenocarcinoma of the lung.  EGFR mutation positive.  PLAN:   1. 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  She initially received 4 cycles of cisplatin and Alimta completing on January 23, 2014.  Patient was on the [facility] protocol for maintenance Tarceva versus placebo, but discontinued treatment in November 2015 because of significant side effects.  Although blinded, presumption was she was receiving Tarceva.  She recently was noted to have a recurrence confirmed by right paratracheal lymph node biopsy.  PET scan did not  reveal any distant metastasis.  She underwent treatment with weekly carboplatinum and Taxol along with daily XRT finishing in October 2018.  Current plan is to pursue the maintenance durvalumab every 2 weeks for up to 12 months.  CT scan results reviewed independently and reported as above with possible airspace disease.  Patient was placed on antibiotics.  Will repeat CT scan in 1 month.  If there is resolution of her airspace disease will initiate maintenance immunotherapy at that point.  Approximately 30 minutes was spent in discussion of which greater than 50% was consultation.  Patient expressed understanding and was in agreement with this plan. She also understands that She can call clinic at any time with any questions, concerns, or complaints.   Cancer Staging No matching staging information was found for the patient.  Lloyd Huger, MD   05/31/2017 3:09 PM

## 2017-06-05 ENCOUNTER — Other Ambulatory Visit: Payer: Medicare Other

## 2017-06-05 ENCOUNTER — Ambulatory Visit: Payer: Medicare Other | Admitting: Internal Medicine

## 2017-06-27 NOTE — Progress Notes (Signed)
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  INTERVAL HISTORY: Patient returns to clinic today for further evaluation, discussion of her CT scan results, and initiation of maintenance durvalumab.  She continues to have a chronic cough that seems to be slightly worse particularly in the mornings, but otherwise feels well.  She continues to be anxious. She has no neurologic complaints.  She denies any recent fevers or illnesses.  She has good appetite and denies weight loss.  She has no chest pain, hemoptysis, or shortness of breath.  She denies any nausea, vomiting, constipation, or diarrhea.  She has no urinary complaints.  Patient offers no further specific complaints today.  REVIEW OF SYSTEMS:   Review of Systems  Constitutional: Negative.  Negative for fever, malaise/fatigue and weight loss.  Respiratory: Positive for cough. Negative for hemoptysis and shortness of breath.   Cardiovascular: Negative.  Negative for chest pain and leg swelling.  Gastrointestinal: Negative.  Negative for abdominal pain.  Genitourinary: Negative.   Musculoskeletal: Negative.   Skin: Negative.  Negative for rash.  Neurological: Negative.  Negative for sensory change, focal weakness and weakness.  Psychiatric/Behavioral: The patient is nervous/anxious.     As per HPI. Otherwise, a complete review of systems is negative.  PAST MEDICAL HISTORY: Past Medical History:  Diagnosis Date  . Acid reflux   . Anemia    HAD TO HAVE IRON INFUSIONS BACK IN 2015  . Arthritis    bil hands and back  . Asthma   . Cancer of right lung ([place]) 2015   lung cancer - chemo, Dr Genevive Bi removed RML Lobectomy.   Marland Kitchen Dyspnea    WITH EXERTION  . Hypertension   . Mitral valve prolapse      PAST SURGICAL HISTORY: Past Surgical History:  Procedure Laterality Date  . ABDOMINAL HYSTERECTOMY    . BREAST BIOPSY Left 1994 (-), 2004 (-)  . COLONOSCOPY  2008    Dr. Donnella Sham  . KNEE SURGERY  2007  . LUNG LOBECTOMY Right    middle lobe  . PORTACATH PLACEMENT Right 02/09/2017   Procedure: INSERTION PORT-A-CATH;  Surgeon: Nestor Lewandowsky, MD;  Location: [facility] ORS;  Service: General;  Laterality: Right;  . SALIVARY GLAND SURGERY Left 1983   with lymph node removal also  . UPPER GI ENDOSCOPY  2008  . VIDEO BRONCHOSCOPY WITH ENDOBRONCHIAL ULTRASOUND Right 01/24/2017   Procedure: VIDEO BRONCHOSCOPY WITH ENDOBRONCHIAL ULTRASOUND;  Surgeon: [name], MD;  Location: [facility] ORS;  Service: Pulmonary;  Laterality: Right;    FAMILY HISTORY: Family History  Problem Relation Age of Onset  . Breast cancer Maternal Aunt   . Breast cancer Paternal Aunt   . Breast cancer Maternal Grandmother   . Breast cancer Maternal Aunt   . Breast cancer Paternal Aunt   . Healthy Mother   . Healthy Father     ADVANCED DIRECTIVES (Y/N):  N  HEALTH MAINTENANCE: Social History   Tobacco Use  . Smoking status: Never Smoker  . Smokeless tobacco: Never Used  Substance Use Topics  . Alcohol use: Yes    Alcohol/week: 4.2 oz    Types: 7 Glasses of wine per week  . Drug use: No     Colonoscopy:  PAP:  Bone density:  Lipid panel:  Allergies  Allergen  Reactions  . Penicillins Swelling and Rash    Has patient had a PCN reaction causing immediate rash, facial/tongue/throat swelling, SOB or lightheadedness with hypotension: Unknown Has patient had a PCN reaction causing severe rash involving mucus membranes or skin necrosis: Unknown Has patient had a PCN reaction that required hospitalization: No Has patient had a PCN reaction occurring within the last 10 years: No If all of the above answers are "NO", then may proceed with Cephalosporin use.    . Sulfa Antibiotics Nausea And Vomiting     Current Outpatient Medications  Medication Sig Dispense Refill  . albuterol (PROVENTIL HFA;VENTOLIN HFA) 108 (90 Base) MCG/ACT inhaler Inhale 1-2 puffs into the lungs every 6 (six) hours as needed for wheezing or shortness of breath.    . B Complex Vitamins (VITAMIN B COMPLEX PO) Take 1 tablet by mouth daily.     . Biotin w/ Vitamins C & E (HAIR/SKIN/NAILS PO) Take 1 tablet by mouth daily.    . Calcium Carb-Cholecalciferol (CALCIUM 600+D) 600-800 MG-UNIT TABS Take 2 tablets by mouth daily.    . Cholecalciferol (D3 MAXIMUM STRENGTH) 5000 units capsule Take 5,000 Units by mouth daily.    . COLLAGEN PO Take 1,000 mg by mouth daily.    . cyclobenzaprine (FLEXERIL) 5 MG tablet Take 1 tablet (5 mg total) by mouth 3 (three) times daily as needed for muscle spasms. 30 tablet 1  . fexofenadine (ALLEGRA) 180 MG tablet Take 180 mg by mouth daily as needed for allergies.     . fluticasone (FLONASE) 50 MCG/ACT nasal spray Place 2 sprays into both nostrils daily as needed for allergies or rhinitis.    . Fluticasone-Salmeterol (ADVAIR) 250-50 MCG/DOSE AEPB Inhale 1 puff into the lungs 2 (two) times daily as needed (for respiratory issues.).    Marland Kitchen lansoprazole (PREVACID) 30 MG capsule Take 30 mg by mouth 2 (two) times daily before a meal.     . lidocaine-prilocaine (EMLA) cream Apply 1 application topically as needed. Apply generously over the Mediport 45 minutes prior to chemotherapy. 30 g 0  . losartan (COZAAR) 25 MG tablet Take 25 mg by mouth at bedtime.     . metoCLOPramide (REGLAN) 10 MG tablet Take 1 tablet (10 mg total) by mouth every 8 (eight) hours as needed for nausea. 40 tablet 3  . ondansetron (ZOFRAN) 8 MG tablet Take 1 tablet (8 mg total) by mouth every 8 (eight) hours as needed for nausea or vomiting (start 3 days; after chemo). 40 tablet 1  . polyethylene glycol (MIRALAX / GLYCOLAX) packet Take 17 g by mouth daily as needed for mild constipation.    . prochlorperazine (COMPAZINE) 10 MG tablet  Take 1 tablet (10 mg total) by mouth every 6 (six) hours as needed for nausea or vomiting. 40 tablet 1  . traZODone (DESYREL) 50 MG tablet Take 1 tablet by mouth 1 day or 1 dose.    . levofloxacin (LEVAQUIN) 500 MG tablet Take 1 tablet (500 mg total) by mouth daily. 14 tablet 0  . predniSONE (STERAPRED UNI-PAK 21 TAB) 10 MG (21) TBPK tablet Taper as directed 21 tablet 0   No current facility-administered medications for this visit.     OBJECTIVE: Vitals:   06/29/17 1355  BP: 128/83  Pulse: 74  Resp: 18  Temp: 97.9 F (36.6 C)     Body mass index is 22.95 kg/m.    ECOG FS:0 - Asymptomatic  General: Well-developed, well-nourished, no acute distress. Eyes: Pink conjunctiva, anicteric sclera. HEENT:  Normocephalic, moist mucous membranes, clear oropharnyx. Lungs: Scattered wheezing in right lung otherwise clear to auscultation. Heart: Regular rate and rhythm. No rubs, murmurs, or gallops. Abdomen: Soft, nontender, nondistended. No organomegaly noted, normoactive bowel sounds. Musculoskeletal: No edema, cyanosis, or clubbing. Neuro: Alert, answering all questions appropriately. Cranial nerves grossly intact. Skin: No rashes or petechiae noted. Psych: Normal affect.   LAB RESULTS:  Lab Results  Component Value Date   NA 136 06/29/2017   K 3.8 06/29/2017   CL 103 06/29/2017   CO2 26 06/29/2017   GLUCOSE 91 06/29/2017   BUN 12 06/29/2017   CREATININE 0.67 06/29/2017   CALCIUM 8.7 (L) 06/29/2017   PROT 7.1 06/29/2017   ALBUMIN 3.5 06/29/2017   AST 26 06/29/2017   ALT 20 06/29/2017   ALKPHOS 92 06/29/2017   BILITOT 0.5 06/29/2017   GFRNONAA >60 06/29/2017   GFRAA >60 06/29/2017    Lab Results  Component Value Date   WBC 4.3 06/29/2017   NEUTROABS 3.3 06/29/2017   HGB 11.7 (L) 06/29/2017   HCT 34.5 (L) 06/29/2017   MCV 94.8 06/29/2017   PLT 269 06/29/2017     STUDIES: Ct Chest W Contrast  Result Date: 06/28/2017 CLINICAL DATA:  Lung cancer diagnosed 2015.   Recurrence 2018. EXAM: CT CHEST WITH CONTRAST TECHNIQUE: Multidetector CT imaging of the chest was performed during intravenous contrast administration. CONTRAST:  56m ISOVUE-300 IOPAMIDOL (ISOVUE-300) INJECTION 61% COMPARISON:  05/22/2017 FINDINGS: Cardiovascular: Port in the RIGHT chest wall with tip in distal SVC. No significant vascular findings. Normal heart size. No pericardial effusion. Mediastinum/Nodes: No axillary or supraclavicular adenopathy. No mediastinal hilar adenopathy. No pericardial effusion. Esophagus normal. Lungs/Pleura: There is consolidative airspace disease in the superior segment RIGHT lower lobe with air bronchograms which is increased in density. There is a small RIGHT effusion which is similar volume. Airspace opacity now involves the RIGHT middle lobe additionally and new from prior. LEFT lung is clear. Upper Abdomen: Limited view of the liver, kidneys, pancreas are unremarkable. Normal adrenal glands. Musculoskeletal: No aggressive osseous lesion. IMPRESSION: 1. Persistent and increased airspace consolidation in the RIGHT lower lobe is now involvement of the RIGHT middle lobe. Findings are most suggestive chronic / progressive pneumonia rather than lung cancer recurrence . Query immune suppression. 2. Small RIGHT effusion is similar. 3. No clear evidence of lung cancer recurrence although the persistent and progressive airspace disease is in the RIGHT lung is concerning (see impression 1). Electronically Signed   By: SSuzy BouchardM.D.   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  PLAN:   1. 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  She initially received 4 cycles of cisplatin and Alimta completing on January 23, 2014.  Patient was on the [facility] protocol for maintenance Tarceva versus placebo, but discontinued  treatment in November 2015 because of significant side effects.  Although blinded, presumption was she was receiving Tarceva.  She recently was noted to have a recurrence confirmed by right paratracheal lymph node biopsy on January 24, 2017.  PET scan on February 03, 2017 did not reveal any distant metastasis.  She underwent treatment with weekly carboplatinum and Taxol along with daily XRT finishing in October 2018.  Current plan is to pursue the maintenance durvalumab every 2 weeks for up to 12 months.  Repeat CT scan results from June 28, 2017  reviewed independently and reported as above with persistent and increased airspace consolidation in the right lower and middle lobe possible airspace disease.  Patient was given a Medrol Dosepak as well as an additional 14 days of daily Levaquin.  She has been instructed to reinitiate her Advair inhaler.  Proceed with cycle 1 of maintenance durvalumab today.  Return to clinic in 2 weeks for consideration of cycle 2. 2.  Cough: CT results as above.  Antibiotics, Medrol Dosepak and Advair as above.  If patient's symptoms resolved, will not repeat imaging.  Approximately 30 minutes was spent in discussion of which greater than 50% was consultation.  Patient expressed understanding and was in agreement with this plan. She also understands that She can call clinic at any time with any questions, concerns, or complaints.   Cancer Staging No matching staging information was found for the patient.  Lloyd Huger, MD   06/30/2017 11:25 AM

## 2017-06-28 ENCOUNTER — Ambulatory Visit
Admission: RE | Admit: 2017-06-28 | Discharge: 2017-06-28 | Disposition: A | Discharge status: Home / Self Care | Payer: Medicare Other | Source: Ambulatory Visit | Attending: Oncology | Admitting: Oncology

## 2017-06-28 DIAGNOSIS — C3490 Malignant neoplasm of unspecified part of unspecified bronchus or lung: Secondary | ICD-10-CM | POA: Diagnosis not present

## 2017-06-28 DIAGNOSIS — J90 Pleural effusion, not elsewhere classified: Secondary | ICD-10-CM | POA: Diagnosis not present

## 2017-06-28 DIAGNOSIS — R918 Other nonspecific abnormal finding of lung field: Secondary | ICD-10-CM | POA: Diagnosis not present

## 2017-06-28 MED ORDER — IOPAMIDOL (ISOVUE-300) INJECTION 61%
75.0000 mL | Freq: Once | INTRAVENOUS | Status: AC | PRN
  Administered 2017-06-28: 75 mL via INTRAVENOUS

## 2017-06-29 ENCOUNTER — Inpatient Hospital Stay: Payer: Medicare Other

## 2017-06-29 ENCOUNTER — Other Ambulatory Visit: Payer: Self-pay | Admitting: *Deleted

## 2017-06-29 ENCOUNTER — Inpatient Hospital Stay: Payer: Medicare Other | Attending: Oncology | Admitting: Oncology

## 2017-06-29 VITALS — BP 128/83 | HR 74 | Temp 97.9°F | Resp 18 | Wt 133.7 lb

## 2017-06-29 DIAGNOSIS — Z923 Personal history of irradiation: Secondary | ICD-10-CM | POA: Diagnosis not present

## 2017-06-29 DIAGNOSIS — Z9221 Personal history of antineoplastic chemotherapy: Secondary | ICD-10-CM | POA: Diagnosis not present

## 2017-06-29 DIAGNOSIS — Z5112 Encounter for antineoplastic immunotherapy: Secondary | ICD-10-CM | POA: Diagnosis not present

## 2017-06-29 DIAGNOSIS — I341 Nonrheumatic mitral (valve) prolapse: Secondary | ICD-10-CM | POA: Insufficient documentation

## 2017-06-29 DIAGNOSIS — Z79899 Other long term (current) drug therapy: Secondary | ICD-10-CM | POA: Diagnosis not present

## 2017-06-29 DIAGNOSIS — I10 Essential (primary) hypertension: Secondary | ICD-10-CM | POA: Insufficient documentation

## 2017-06-29 DIAGNOSIS — C342 Malignant neoplasm of middle lobe, bronchus or lung: Secondary | ICD-10-CM

## 2017-06-29 DIAGNOSIS — R05 Cough: Secondary | ICD-10-CM | POA: Diagnosis not present

## 2017-06-29 DIAGNOSIS — J45909 Unspecified asthma, uncomplicated: Secondary | ICD-10-CM | POA: Insufficient documentation

## 2017-06-29 DIAGNOSIS — M1990 Unspecified osteoarthritis, unspecified site: Secondary | ICD-10-CM | POA: Diagnosis not present

## 2017-06-29 DIAGNOSIS — C3490 Malignant neoplasm of unspecified part of unspecified bronchus or lung: Secondary | ICD-10-CM

## 2017-06-29 DIAGNOSIS — K219 Gastro-esophageal reflux disease without esophagitis: Secondary | ICD-10-CM | POA: Insufficient documentation

## 2017-06-29 LAB — COMPREHENSIVE METABOLIC PANEL
ALBUMIN: 3.5 g/dL (ref 3.5–5.0)
ALK PHOS: 92 U/L (ref 38–126)
ALT: 20 U/L (ref 14–54)
ANION GAP: 7 (ref 5–15)
AST: 26 U/L (ref 15–41)
BILIRUBIN TOTAL: 0.5 mg/dL (ref 0.3–1.2)
BUN: 12 mg/dL (ref 6–20)
CALCIUM: 8.7 mg/dL — AB (ref 8.9–10.3)
CO2: 26 mmol/L (ref 22–32)
CREATININE: 0.67 mg/dL (ref 0.44–1.00)
Chloride: 103 mmol/L (ref 101–111)
GFR calc Af Amer: >60 mL/min (ref >60)
GFR calc non Af Amer: >60 mL/min (ref >60)
GLUCOSE: 91 mg/dL (ref 65–99)
Potassium: 3.8 mmol/L (ref 3.5–5.1)
SODIUM: 136 mmol/L (ref 135–145)
TOTAL PROTEIN: 7.1 g/dL (ref 6.5–8.1)

## 2017-06-29 LAB — CBC WITH DIFFERENTIAL/PLATELET
BASOS ABS: 0 10*3/uL (ref 0–0.1)
Basophils Relative: 1 %
EOS ABS: 0.1 10*3/uL (ref 0–0.7)
EOS PCT: 2 %
HEMATOCRIT: 34.5 % — AB (ref 35.0–47.0)
Hemoglobin: 11.7 g/dL — ABNORMAL LOW (ref 12.0–16.0)
Lymphocytes Relative: 14 %
Lymphs Abs: 0.6 10*3/uL — ABNORMAL LOW (ref 1.0–3.6)
MCH: 32.1 pg (ref 26.0–34.0)
MCHC: 33.8 g/dL (ref 32.0–36.0)
MCV: 94.8 fL (ref 80.0–100.0)
MONO ABS: 0.4 10*3/uL (ref 0.2–0.9)
MONOS PCT: 8 %
NEUTROS ABS: 3.3 10*3/uL (ref 1.4–6.5)
Neutrophils Relative %: 75 %
PLATELETS: 269 10*3/uL (ref 150–440)
RBC: 3.64 MIL/uL — ABNORMAL LOW (ref 3.80–5.20)
RDW: 12.5 % (ref 11.5–14.5)
WBC: 4.3 10*3/uL (ref 3.6–11.0)

## 2017-06-29 MED ORDER — SODIUM CHLORIDE 0.9 % IV SOLN
Freq: Once | INTRAVENOUS | Status: AC
  Administered 2017-06-29: 15:00:00 via INTRAVENOUS
  Filled 2017-06-29: qty 1000

## 2017-06-29 MED ORDER — SODIUM CHLORIDE 0.9 % IV SOLN
620.0000 mg | Freq: Once | INTRAVENOUS | Status: AC
  Administered 2017-06-29: 620 mg via INTRAVENOUS
  Filled 2017-06-29: qty 12.4

## 2017-06-29 MED ORDER — PREDNISONE 10 MG (21) PO TBPK: ORAL_TABLET | ORAL | 0 refills | Status: DC

## 2017-06-29 MED ORDER — LEVOFLOXACIN 500 MG PO TABS: 500.0000 mg | ORAL_TABLET | Freq: Every day | ORAL | 0 refills | Status: DC

## 2017-06-29 MED ORDER — HEPARIN SOD (PORK) LOCK FLUSH 100 UNIT/ML IV SOLN
500.0000 [IU] | Freq: Once | INTRAVENOUS | Status: AC
  Administered 2017-06-29: 500 [IU] via INTRAVENOUS
  Filled 2017-06-29: qty 5

## 2017-06-29 MED ORDER — CYCLOBENZAPRINE HCL 5 MG PO TABS: 5.0000 mg | ORAL_TABLET | Freq: Three times a day (TID) | ORAL | 1 refills | Status: DC | PRN

## 2017-06-29 NOTE — Progress Notes (Signed)
rsh

## 2017-07-12 NOTE — Progress Notes (Signed)
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  INTERVAL HISTORY: Patient returns to clinic today for further evaluation and continuation of maintenance durvalumab.  She continues to have a chronic cough particularly in the mornings that is unchanged.  She otherwise feels well.  She continues to be anxious. She has no neurologic complaints.  She denies any recent fevers or illnesses.  She has good appetite and denies weight loss.  She has no chest pain, hemoptysis, or shortness of breath.  She denies any nausea, vomiting, constipation, or diarrhea.  She has no urinary complaints.  Patient offers no further specific complaints today.  REVIEW OF SYSTEMS:   Review of Systems  Constitutional: Negative.  Negative for fever, malaise/fatigue and weight loss.  Respiratory: Positive for cough. Negative for hemoptysis and shortness of breath.   Cardiovascular: Negative.  Negative for chest pain and leg swelling.  Gastrointestinal: Negative.  Negative for abdominal pain.  Genitourinary: Negative.   Musculoskeletal: Negative.   Skin: Negative.  Negative for rash.  Neurological: Negative.  Negative for sensory change, focal weakness and weakness.  Psychiatric/Behavioral: The patient is nervous/anxious.     As per HPI. Otherwise, a complete review of systems is negative.  PAST MEDICAL HISTORY: Past Medical History:  Diagnosis Date  . Acid reflux   . Anemia    HAD TO HAVE IRON INFUSIONS BACK IN 2015  . Arthritis    bil hands and back  . Asthma   . Cancer of right lung ([place]) 2015   lung cancer - chemo, Dr Genevive Bi removed RML Lobectomy.   Marland Kitchen Dyspnea    WITH EXERTION  . Hypertension   . Mitral valve prolapse     PAST SURGICAL HISTORY: Past Surgical  History:  Procedure Laterality Date  . ABDOMINAL HYSTERECTOMY    . BREAST BIOPSY Left 1994 (-), 2004 (-)  . COLONOSCOPY  2008    Dr. Donnella Sham  . KNEE SURGERY  2007  . LUNG LOBECTOMY Right    middle lobe  . PORTACATH PLACEMENT Right 02/09/2017   Procedure: INSERTION PORT-A-CATH;  Surgeon: Nestor Lewandowsky, MD;  Location: [facility] ORS;  Service: General;  Laterality: Right;  . SALIVARY GLAND SURGERY Left 1983   with lymph node removal also  . UPPER GI ENDOSCOPY  2008  . VIDEO BRONCHOSCOPY WITH ENDOBRONCHIAL ULTRASOUND Right 01/24/2017   Procedure: VIDEO BRONCHOSCOPY WITH ENDOBRONCHIAL ULTRASOUND;  Surgeon: [name], MD;  Location: [facility] ORS;  Service: Pulmonary;  Laterality: Right;    FAMILY HISTORY: Family History  Problem Relation Age of Onset  . Breast cancer Maternal Aunt   . Breast cancer Paternal Aunt   . Breast cancer Maternal Grandmother   . Breast cancer Maternal Aunt   . Breast cancer Paternal Aunt   . Healthy Mother   . Healthy Father     ADVANCED DIRECTIVES (Y/N):  N  HEALTH MAINTENANCE: Social History   Tobacco Use  . Smoking status: Never Smoker  . Smokeless tobacco: Never Used  Substance Use Topics  . Alcohol use: Yes    Alcohol/week: 4.2 oz    Types: 7 Glasses of wine per week  . Drug use: No     Colonoscopy:  PAP:  Bone density:  Lipid panel:  Allergies  Allergen Reactions  . Penicillins Swelling and Rash  Has patient had a PCN reaction causing immediate rash, facial/tongue/throat swelling, SOB or lightheadedness with hypotension: Unknown Has patient had a PCN reaction causing severe rash involving mucus membranes or skin necrosis: Unknown Has patient had a PCN reaction that required hospitalization: No Has patient had a PCN reaction occurring within the last 10 years: No If all of the above answers are "NO", then may proceed with Cephalosporin use.    . Sulfa Antibiotics Nausea And Vomiting    Current Outpatient Medications    Medication Sig Dispense Refill  . albuterol (PROVENTIL HFA;VENTOLIN HFA) 108 (90 Base) MCG/ACT inhaler Inhale 1-2 puffs into the lungs every 6 (six) hours as needed for wheezing or shortness of breath.    . B Complex Vitamins (VITAMIN B COMPLEX PO) Take 1 tablet by mouth daily.     . Biotin w/ Vitamins C & E (HAIR/SKIN/NAILS PO) Take 1 tablet by mouth daily.    . Calcium Carb-Cholecalciferol (CALCIUM 600+D) 600-800 MG-UNIT TABS Take 2 tablets by mouth daily.    . Cholecalciferol (D3 MAXIMUM STRENGTH) 5000 units capsule Take 5,000 Units by mouth daily.    . COLLAGEN PO Take 1,000 mg by mouth daily.    . cyclobenzaprine (FLEXERIL) 5 MG tablet Take 1 tablet (5 mg total) by mouth 3 (three) times daily as needed for muscle spasms. 30 tablet 1  . fexofenadine (ALLEGRA) 180 MG tablet Take 180 mg by mouth daily as needed for allergies.     . fluticasone (FLONASE) 50 MCG/ACT nasal spray Place 2 sprays into both nostrils daily as needed for allergies or rhinitis.    . Fluticasone-Salmeterol (ADVAIR) 250-50 MCG/DOSE AEPB Inhale 1 puff into the lungs 2 (two) times daily as needed (for respiratory issues.).    Marland Kitchen lansoprazole (PREVACID) 30 MG capsule Take 30 mg by mouth 2 (two) times daily before a meal.     . lidocaine-prilocaine (EMLA) cream Apply 1 application topically as needed. Apply generously over the Mediport 45 minutes prior to chemotherapy. 30 g 0  . losartan (COZAAR) 25 MG tablet Take 25 mg by mouth at bedtime.     . metoCLOPramide (REGLAN) 10 MG tablet Take 1 tablet (10 mg total) by mouth every 8 (eight) hours as needed for nausea. 40 tablet 3  . ondansetron (ZOFRAN) 8 MG tablet Take 1 tablet (8 mg total) by mouth every 8 (eight) hours as needed for nausea or vomiting (start 3 days; after chemo). 40 tablet 1  . polyethylene glycol (MIRALAX / GLYCOLAX) packet Take 17 g by mouth daily as needed for mild constipation.    . prochlorperazine (COMPAZINE) 10 MG tablet Take 1 tablet (10 mg total) by mouth  every 6 (six) hours as needed for nausea or vomiting. 40 tablet 1  . traZODone (DESYREL) 50 MG tablet Take 1 tablet by mouth 1 day or 1 dose.     No current facility-administered medications for this visit.     OBJECTIVE: Vitals:   07/13/17 1020  BP: 121/83  Pulse: 75  Temp: (!) 97.2 F (36.2 C)     Body mass index is 22.85 kg/m.    ECOG FS:0 - Asymptomatic  General: Well-developed, well-nourished, no acute distress. Eyes: Pink conjunctiva, anicteric sclera. HEENT: Normocephalic, moist mucous membranes, clear oropharnyx. Lungs: Scattered wheezing in right lung otherwise clear to auscultation. Heart: Regular rate and rhythm. No rubs, murmurs, or gallops. Abdomen: Soft, nontender, nondistended. No organomegaly noted, normoactive bowel sounds. Musculoskeletal: No edema, cyanosis, or clubbing. Neuro: Alert, answering all questions appropriately.  Cranial nerves grossly intact. Skin: No rashes or petechiae noted. Psych: Normal affect.   LAB RESULTS:  Lab Results  Component Value Date   NA 136 07/13/2017   K 3.7 07/13/2017   CL 102 07/13/2017   CO2 27 07/13/2017   GLUCOSE 96 07/13/2017   BUN 15 07/13/2017   CREATININE 0.72 07/13/2017   CALCIUM 8.8 (L) 07/13/2017   PROT 6.9 07/13/2017   ALBUMIN 3.4 (L) 07/13/2017   AST 28 07/13/2017   ALT 21 07/13/2017   ALKPHOS 88 07/13/2017   BILITOT 0.6 07/13/2017   GFRNONAA >60 07/13/2017   GFRAA >60 07/13/2017    Lab Results  Component Value Date   WBC 4.2 07/13/2017   NEUTROABS 3.1 07/13/2017   HGB 11.9 (L) 07/13/2017   HCT 35.5 07/13/2017   MCV 92.9 07/13/2017   PLT 259 07/13/2017     STUDIES: Ct Chest W Contrast  Result Date: 06/28/2017 CLINICAL DATA:  Lung cancer diagnosed 2015.  Recurrence 2018. EXAM: CT CHEST WITH CONTRAST TECHNIQUE: Multidetector CT imaging of the chest was performed during intravenous contrast administration. CONTRAST:  1m ISOVUE-300 IOPAMIDOL (ISOVUE-300) INJECTION 61% COMPARISON:  05/22/2017  FINDINGS: Cardiovascular: Port in the RIGHT chest wall with tip in distal SVC. No significant vascular findings. Normal heart size. No pericardial effusion. Mediastinum/Nodes: No axillary or supraclavicular adenopathy. No mediastinal hilar adenopathy. No pericardial effusion. Esophagus normal. Lungs/Pleura: There is consolidative airspace disease in the superior segment RIGHT lower lobe with air bronchograms which is increased in density. There is a small RIGHT effusion which is similar volume. Airspace opacity now involves the RIGHT middle lobe additionally and new from prior. LEFT lung is clear. Upper Abdomen: Limited view of the liver, kidneys, pancreas are unremarkable. Normal adrenal glands. Musculoskeletal: No aggressive osseous lesion. IMPRESSION: 1. Persistent and increased airspace consolidation in the RIGHT lower lobe is now involvement of the RIGHT middle lobe. Findings are most suggestive chronic / progressive pneumonia rather than lung cancer recurrence . Query immune suppression. 2. Small RIGHT effusion is similar. 3. No clear evidence of lung cancer recurrence although the persistent and progressive airspace disease is in the RIGHT lung is concerning (see impression 1). Electronically Signed   By: SSuzy BouchardM.D.   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  PLAN:   1. 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  She initially received 4 cycles of cisplatin and Alimta completing on January 23, 2014.  Patient was on the [facility] protocol for maintenance Tarceva versus placebo, but discontinued treatment in November 2015 because of significant side effects.  Although blinded, presumption was she was receiving Tarceva.  She recently was noted to have a recurrence confirmed by right paratracheal lymph node biopsy on January 24, 2017.  PET  scan on February 03, 2017 did not reveal any distant metastasis.  She underwent treatment with weekly carboplatinum and Taxol along with daily XRT finishing in October 2018.  Current plan is to pursue the maintenance durvalumab every 2 weeks for up to 12 months.  Repeat CT scan results from June 28, 2017 reviewed independently with persistent and increased airspace consolidation in the right lower and middle lobe possible airspace disease.  Patient is now completed a Medrol Dosepak as well as 2 weeks of antibiotics with only a mild improvement of her symptoms.  Continue Advil inhaler as prescribed. Proceed  with cycle 2 of maintenance durvalumab today.  Return to clinic in 2 weeks for consideration of cycle 3. 2.  Cough: CT results as above.  Antibiotics, Medrol Dosepak and Advair as above.   Approximately 30 minutes was spent in discussion of which greater than 50% was consultation.  Patient expressed understanding and was in agreement with this plan. She also understands that She can call clinic at any time with any questions, concerns, or complaints.   Cancer Staging No matching staging information was found for the patient.  Lloyd Huger, MD   07/16/2017 9:53 AM

## 2017-07-13 ENCOUNTER — Inpatient Hospital Stay: Payer: Medicare Other | Attending: Oncology

## 2017-07-13 ENCOUNTER — Other Ambulatory Visit: Payer: Self-pay

## 2017-07-13 ENCOUNTER — Inpatient Hospital Stay: Payer: Medicare Other

## 2017-07-13 ENCOUNTER — Inpatient Hospital Stay (HOSPITAL_BASED_OUTPATIENT_CLINIC_OR_DEPARTMENT_OTHER): Payer: Medicare Other | Admitting: Oncology

## 2017-07-13 ENCOUNTER — Encounter: Payer: Self-pay | Admitting: Oncology

## 2017-07-13 VITALS — BP 121/83 | HR 75 | Temp 97.2°F | Wt 133.1 lb

## 2017-07-13 DIAGNOSIS — Z923 Personal history of irradiation: Secondary | ICD-10-CM | POA: Insufficient documentation

## 2017-07-13 DIAGNOSIS — C342 Malignant neoplasm of middle lobe, bronchus or lung: Secondary | ICD-10-CM

## 2017-07-13 DIAGNOSIS — R05 Cough: Secondary | ICD-10-CM | POA: Insufficient documentation

## 2017-07-13 DIAGNOSIS — Z5112 Encounter for antineoplastic immunotherapy: Secondary | ICD-10-CM | POA: Insufficient documentation

## 2017-07-13 DIAGNOSIS — C3490 Malignant neoplasm of unspecified part of unspecified bronchus or lung: Secondary | ICD-10-CM

## 2017-07-13 DIAGNOSIS — Z79899 Other long term (current) drug therapy: Secondary | ICD-10-CM

## 2017-07-13 DIAGNOSIS — K140 Glossitis: Secondary | ICD-10-CM | POA: Insufficient documentation

## 2017-07-13 DIAGNOSIS — C801 Malignant (primary) neoplasm, unspecified: Secondary | ICD-10-CM

## 2017-07-13 DIAGNOSIS — Z9221 Personal history of antineoplastic chemotherapy: Secondary | ICD-10-CM | POA: Diagnosis not present

## 2017-07-13 LAB — CBC WITH DIFFERENTIAL/PLATELET
BASOS ABS: 0 10*3/uL (ref 0–0.1)
Basophils Relative: 1 %
Eosinophils Absolute: 0.1 10*3/uL (ref 0–0.7)
Eosinophils Relative: 2 %
HEMATOCRIT: 35.5 % (ref 35.0–47.0)
Hemoglobin: 11.9 g/dL — ABNORMAL LOW (ref 12.0–16.0)
LYMPHS PCT: 13 %
Lymphs Abs: 0.6 10*3/uL — ABNORMAL LOW (ref 1.0–3.6)
MCH: 31.2 pg (ref 26.0–34.0)
MCHC: 33.6 g/dL (ref 32.0–36.0)
MCV: 92.9 fL (ref 80.0–100.0)
MONO ABS: 0.4 10*3/uL (ref 0.2–0.9)
MONOS PCT: 9 %
NEUTROS ABS: 3.1 10*3/uL (ref 1.4–6.5)
Neutrophils Relative %: 75 %
Platelets: 259 10*3/uL (ref 150–440)
RBC: 3.82 MIL/uL (ref 3.80–5.20)
RDW: 12.6 % (ref 11.5–14.5)
WBC: 4.2 10*3/uL (ref 3.6–11.0)

## 2017-07-13 LAB — COMPREHENSIVE METABOLIC PANEL
ALBUMIN: 3.4 g/dL — AB (ref 3.5–5.0)
ALT: 21 U/L (ref 14–54)
AST: 28 U/L (ref 15–41)
Alkaline Phosphatase: 88 U/L (ref 38–126)
Anion gap: 7 (ref 5–15)
BUN: 15 mg/dL (ref 6–20)
CHLORIDE: 102 mmol/L (ref 101–111)
CO2: 27 mmol/L (ref 22–32)
Calcium: 8.8 mg/dL — ABNORMAL LOW (ref 8.9–10.3)
Creatinine, Ser: 0.72 mg/dL (ref 0.44–1.00)
GFR calc Af Amer: >60 mL/min (ref >60)
GFR calc non Af Amer: >60 mL/min (ref >60)
GLUCOSE: 96 mg/dL (ref 65–99)
POTASSIUM: 3.7 mmol/L (ref 3.5–5.1)
SODIUM: 136 mmol/L (ref 135–145)
Total Bilirubin: 0.6 mg/dL (ref 0.3–1.2)
Total Protein: 6.9 g/dL (ref 6.5–8.1)

## 2017-07-13 MED ORDER — HEPARIN SOD (PORK) LOCK FLUSH 100 UNIT/ML IV SOLN
500.0000 [IU] | Freq: Once | INTRAVENOUS | Status: AC | PRN
  Administered 2017-07-13: 500 [IU]
  Filled 2017-07-13: qty 5

## 2017-07-13 MED ORDER — SODIUM CHLORIDE 0.9 % IV SOLN
Freq: Once | INTRAVENOUS | Status: AC
  Administered 2017-07-13: 11:00:00 via INTRAVENOUS
  Filled 2017-07-13: qty 1000

## 2017-07-13 MED ORDER — SODIUM CHLORIDE 0.9% FLUSH
10.0000 mL | INTRAVENOUS | Status: DC | PRN
  Filled 2017-07-13: qty 10

## 2017-07-13 MED ORDER — SODIUM CHLORIDE 0.9 % IV SOLN
620.0000 mg | Freq: Once | INTRAVENOUS | Status: AC
  Administered 2017-07-13: 620 mg via INTRAVENOUS
  Filled 2017-07-13: qty 2.4

## 2017-07-14 LAB — THYROID PANEL WITH TSH
FREE THYROXINE INDEX: 1.5 (ref 1.2–4.9)
T3 UPTAKE RATIO: 22 % — AB (ref 24–39)
T4, Total: 7 ug/dL (ref 4.5–12.0)
TSH: 3.67 u[IU]/mL (ref 0.450–4.500)

## 2017-07-30 NOTE — Progress Notes (Signed)
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  INTERVAL HISTORY: Patient returns to clinic today for further evaluation and consideration of cycle 3 of maintenance durvalumab.  She continues to have a chronic cough particularly in the mornings that is unchanged.  She has a small ulceration on her tongue.  She otherwise feels well.  She continues to be anxious. She has no neurologic complaints.  She denies any recent fevers or illnesses.  She has good appetite and denies weight loss.  She has no chest pain, hemoptysis, or shortness of breath.  She denies any nausea, vomiting, constipation, or diarrhea.  She has no urinary complaints.  Patient offers no further specific complaints today.  REVIEW OF SYSTEMS:   Review of Systems  Constitutional: Negative.  Negative for fever, malaise/fatigue and weight loss.  Respiratory: Positive for cough. Negative for hemoptysis and shortness of breath.   Cardiovascular: Negative.  Negative for chest pain and leg swelling.  Gastrointestinal: Negative.  Negative for abdominal pain.  Genitourinary: Negative.   Musculoskeletal: Negative.   Skin: Negative.  Negative for rash.  Neurological: Negative.  Negative for sensory change, focal weakness and weakness.  Psychiatric/Behavioral: The patient is nervous/anxious.     As per HPI. Otherwise, a complete review of systems is negative.  PAST MEDICAL HISTORY: Past Medical History:  Diagnosis Date  . Acid reflux   . Anemia    HAD TO HAVE IRON INFUSIONS BACK IN 2015  . Arthritis    bil hands and back  . Asthma   . Cancer of right lung ([place]) 2015   lung cancer - chemo, Dr Genevive Bi removed RML Lobectomy.   Marland Kitchen Dyspnea    WITH EXERTION  . Hypertension   . Mitral valve  prolapse     PAST SURGICAL HISTORY: Past Surgical History:  Procedure Laterality Date  . ABDOMINAL HYSTERECTOMY    . BREAST BIOPSY Left 1994 (-), 2004 (-)  . COLONOSCOPY  2008    Dr. Donnella Sham  . KNEE SURGERY  2007  . LUNG LOBECTOMY Right    middle lobe  . PORTACATH PLACEMENT Right 02/09/2017   Procedure: INSERTION PORT-A-CATH;  Surgeon: Nestor Lewandowsky, MD;  Location: [facility] ORS;  Service: General;  Laterality: Right;  . SALIVARY GLAND SURGERY Left 1983   with lymph node removal also  . UPPER GI ENDOSCOPY  2008  . VIDEO BRONCHOSCOPY WITH ENDOBRONCHIAL ULTRASOUND Right 01/24/2017   Procedure: VIDEO BRONCHOSCOPY WITH ENDOBRONCHIAL ULTRASOUND;  Surgeon: [name], MD;  Location: [facility] ORS;  Service: Pulmonary;  Laterality: Right;    FAMILY HISTORY: Family History  Problem Relation Age of Onset  . Breast cancer Maternal Aunt   . Breast cancer Paternal Aunt   . Breast cancer Maternal Grandmother   . Breast cancer Maternal Aunt   . Breast cancer Paternal Aunt   . Healthy Mother   . Healthy Father     ADVANCED DIRECTIVES (Y/N):  N  HEALTH MAINTENANCE: Social History   Tobacco Use  . Smoking status: Never Smoker  . Smokeless tobacco: Never Used  Substance Use Topics  . Alcohol use: Yes    Alcohol/week: 4.2 oz    Types: 7 Glasses of wine per week  . Drug use: No     Colonoscopy:  PAP:  Bone density:  Lipid panel:  Allergies  Allergen Reactions  . Penicillins Swelling and Rash    Has patient had a PCN reaction causing immediate rash, facial/tongue/throat swelling, SOB or lightheadedness with hypotension: Unknown Has patient had a PCN reaction causing severe rash involving mucus membranes or skin necrosis: Unknown Has patient had a PCN reaction that required hospitalization: No Has patient had a PCN reaction occurring within the last 10 years: No If all of the above answers are "NO", then may proceed with Cephalosporin use.    . Sulfa Antibiotics Nausea And  Vomiting    Current Outpatient Medications  Medication Sig Dispense Refill  . albuterol (PROVENTIL HFA;VENTOLIN HFA) 108 (90 Base) MCG/ACT inhaler Inhale 1-2 puffs into the lungs every 6 (six) hours as needed for wheezing or shortness of breath.    . B Complex Vitamins (VITAMIN B COMPLEX PO) Take 1 tablet by mouth daily.     . Biotin w/ Vitamins C & E (HAIR/SKIN/NAILS PO) Take 1 tablet by mouth daily.    . Calcium Carb-Cholecalciferol (CALCIUM 600+D) 600-800 MG-UNIT TABS Take 2 tablets by mouth daily.    . Cholecalciferol (D3 MAXIMUM STRENGTH) 5000 units capsule Take 5,000 Units by mouth daily.    . COLLAGEN PO Take 1,000 mg by mouth daily.    . cyclobenzaprine (FLEXERIL) 5 MG tablet Take 1 tablet (5 mg total) by mouth 3 (three) times daily as needed for muscle spasms. 30 tablet 1  . fexofenadine (ALLEGRA) 180 MG tablet Take 180 mg by mouth daily as needed for allergies.     . fluticasone (FLONASE) 50 MCG/ACT nasal spray Place 2 sprays into both nostrils daily as needed for allergies or rhinitis.    . Fluticasone-Salmeterol (ADVAIR) 250-50 MCG/DOSE AEPB Inhale 1 puff into the lungs 2 (two) times daily as needed (for respiratory issues.).    Marland Kitchen lansoprazole (PREVACID) 30 MG capsule Take 30 mg by mouth 2 (two) times daily before a meal.     . lidocaine-prilocaine (EMLA) cream Apply 1 application topically as needed. Apply generously over the Mediport 45 minutes prior to chemotherapy. 30 g 0  . losartan (COZAAR) 25 MG tablet Take 25 mg by mouth at bedtime.     . metoCLOPramide (REGLAN) 10 MG tablet Take 1 tablet (10 mg total) by mouth every 8 (eight) hours as needed for nausea. 40 tablet 3  . ondansetron (ZOFRAN) 8 MG tablet Take 1 tablet (8 mg total) by mouth every 8 (eight) hours as needed for nausea or vomiting (start 3 days; after chemo). 40 tablet 1  . polyethylene glycol (MIRALAX / GLYCOLAX) packet Take 17 g by mouth daily as needed for mild constipation.    . prochlorperazine (COMPAZINE) 10  MG tablet Take 1 tablet (10 mg total) by mouth every 6 (six) hours as needed for nausea or vomiting. 40 tablet 1  . levofloxacin (LEVAQUIN) 500 MG tablet Take 1 tablet (500 mg total) by mouth daily. 7 tablet 0  . traZODone (DESYREL) 50 MG tablet Take 1 tablet by mouth 1 day or 1 dose.     No current facility-administered medications for this visit.    Facility-Administered Medications Ordered in Other Visits  Medication Dose Route Frequency Provider Last Rate Last Dose  . sodium chloride flush (NS) 0.9 % injection 10 mL  10 mL Intracatheter PRN Lloyd Huger, MD   10 mL at 07/31/17 1408    OBJECTIVE: Vitals:   07/31/17 1444  BP: 109/75  Pulse: 83  Resp: 18  Temp: 97.6 F (36.4 C)  Body mass index is 23.09 kg/m.    ECOG FS:0 - Asymptomatic  General: Well-developed, well-nourished, no acute distress. Eyes: Pink conjunctiva, anicteric sclera. HEENT: Normocephalic, moist mucous membranes, clear oropharnyx.  Small ulceration on central tongue. Lungs: Scattered wheezing in right lung otherwise clear to auscultation. Heart: Regular rate and rhythm. No rubs, murmurs, or gallops. Abdomen: Soft, nontender, nondistended. No organomegaly noted, normoactive bowel sounds. Musculoskeletal: No edema, cyanosis, or clubbing. Neuro: Alert, answering all questions appropriately. Cranial nerves grossly intact. Skin: No rashes or petechiae noted. Psych: Normal affect.   LAB RESULTS:  Lab Results  Component Value Date   NA 139 07/31/2017   K 3.8 07/31/2017   CL 105 07/31/2017   CO2 25 07/31/2017   GLUCOSE 108 (H) 07/31/2017   BUN 15 07/31/2017   CREATININE 0.62 07/31/2017   CALCIUM 8.9 07/31/2017   PROT 6.8 07/31/2017   ALBUMIN 3.5 07/31/2017   AST 28 07/31/2017   ALT 18 07/31/2017   ALKPHOS 83 07/31/2017   BILITOT 0.4 07/31/2017   GFRNONAA >60 07/31/2017   GFRAA >60 07/31/2017    Lab Results  Component Value Date   WBC 3.4 (L) 07/31/2017   NEUTROABS 2.5 07/31/2017   HGB  11.9 (L) 07/31/2017   HCT 35.5 07/31/2017   MCV 91.3 07/31/2017   PLT 275 07/31/2017     STUDIES: No results found.  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  PLAN:   1. 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  She initially received 4 cycles of cisplatin and Alimta completing on January 23, 2014.  Patient was on the [facility] protocol for maintenance Tarceva versus placebo, but discontinued treatment in November 2015 because of significant side effects.  Although blinded, presumption was she was receiving Tarceva.  She recently was noted to have a recurrence confirmed by right paratracheal lymph node biopsy on January 24, 2017.  PET scan on February 03, 2017 did not reveal any distant metastasis.  She underwent treatment with weekly carboplatinum and Taxol along with daily XRT finishing in October 2018.  Current plan is to pursue the maintenance durvalumab every 2 weeks for up to 12 months.  Repeat CT scan results from June 28, 2017 reviewed independently with persistent and increased airspace consolidation in the right lower and middle lobe possible airspace disease.  Proceed with cycle 3 of maintenance durvalumab today.  Patient has a preplanned trip to [place], therefore she will return to clinic in 4 weeks for further evaluation and consideration of cycle 4.   2.  Cough: CT results as above.  Patient completed a Medrol Dosepak as well as 2 weeks of antibiotics with only a mild improvement of her symptoms.  Continue Advair inhaler as prescribed.  3.  Tongue ulcer: Recommended patient use baking soda and water.  Approximately 30 minutes was spent in discussion of which greater than 50% was consultation.  Patient expressed understanding and was in agreement with this plan. She also understands that She can call clinic at any time with any questions, concerns,  or complaints.   Cancer Staging No matching staging information was found for the patient.  Lloyd Huger, MD   07/31/2017 4:52 PM

## 2017-07-31 ENCOUNTER — Inpatient Hospital Stay: Payer: Medicare Other

## 2017-07-31 ENCOUNTER — Encounter: Payer: Self-pay | Admitting: Oncology

## 2017-07-31 ENCOUNTER — Inpatient Hospital Stay (HOSPITAL_BASED_OUTPATIENT_CLINIC_OR_DEPARTMENT_OTHER): Payer: Medicare Other | Admitting: Oncology

## 2017-07-31 ENCOUNTER — Other Ambulatory Visit: Payer: Self-pay

## 2017-07-31 VITALS — BP 109/75 | HR 83 | Temp 97.6°F | Resp 18 | Wt 134.5 lb

## 2017-07-31 DIAGNOSIS — K140 Glossitis: Secondary | ICD-10-CM | POA: Diagnosis not present

## 2017-07-31 DIAGNOSIS — R05 Cough: Secondary | ICD-10-CM | POA: Diagnosis not present

## 2017-07-31 DIAGNOSIS — C342 Malignant neoplasm of middle lobe, bronchus or lung: Secondary | ICD-10-CM

## 2017-07-31 DIAGNOSIS — Z9221 Personal history of antineoplastic chemotherapy: Secondary | ICD-10-CM | POA: Diagnosis not present

## 2017-07-31 DIAGNOSIS — Z79899 Other long term (current) drug therapy: Secondary | ICD-10-CM

## 2017-07-31 DIAGNOSIS — Z923 Personal history of irradiation: Secondary | ICD-10-CM

## 2017-07-31 DIAGNOSIS — Z5112 Encounter for antineoplastic immunotherapy: Secondary | ICD-10-CM | POA: Diagnosis not present

## 2017-07-31 DIAGNOSIS — C3490 Malignant neoplasm of unspecified part of unspecified bronchus or lung: Secondary | ICD-10-CM

## 2017-07-31 LAB — CBC WITH DIFFERENTIAL/PLATELET
BASOS PCT: 1 %
Basophils Absolute: 0 10*3/uL (ref 0–0.1)
EOS ABS: 0.1 10*3/uL (ref 0–0.7)
Eosinophils Relative: 4 %
HCT: 35.5 % (ref 35.0–47.0)
Hemoglobin: 11.9 g/dL — ABNORMAL LOW (ref 12.0–16.0)
Lymphocytes Relative: 13 %
Lymphs Abs: 0.4 10*3/uL — ABNORMAL LOW (ref 1.0–3.6)
MCH: 30.7 pg (ref 26.0–34.0)
MCHC: 33.6 g/dL (ref 32.0–36.0)
MCV: 91.3 fL (ref 80.0–100.0)
MONO ABS: 0.3 10*3/uL (ref 0.2–0.9)
MONOS PCT: 10 %
NEUTROS PCT: 72 %
Neutro Abs: 2.5 10*3/uL (ref 1.4–6.5)
Platelets: 275 10*3/uL (ref 150–440)
RBC: 3.89 MIL/uL (ref 3.80–5.20)
RDW: 12.4 % (ref 11.5–14.5)
WBC: 3.4 10*3/uL — ABNORMAL LOW (ref 3.6–11.0)

## 2017-07-31 LAB — COMPREHENSIVE METABOLIC PANEL
ALK PHOS: 83 U/L (ref 38–126)
ALT: 18 U/L (ref 14–54)
AST: 28 U/L (ref 15–41)
Albumin: 3.5 g/dL (ref 3.5–5.0)
Anion gap: 9 (ref 5–15)
BILIRUBIN TOTAL: 0.4 mg/dL (ref 0.3–1.2)
BUN: 15 mg/dL (ref 6–20)
CALCIUM: 8.9 mg/dL (ref 8.9–10.3)
CO2: 25 mmol/L (ref 22–32)
CREATININE: 0.62 mg/dL (ref 0.44–1.00)
Chloride: 105 mmol/L (ref 101–111)
GFR calc non Af Amer: >60 mL/min (ref >60)
GLUCOSE: 108 mg/dL — AB (ref 65–99)
Potassium: 3.8 mmol/L (ref 3.5–5.1)
SODIUM: 139 mmol/L (ref 135–145)
Total Protein: 6.8 g/dL (ref 6.5–8.1)

## 2017-07-31 MED ORDER — HEPARIN SOD (PORK) LOCK FLUSH 100 UNIT/ML IV SOLN
500.0000 [IU] | Freq: Once | INTRAVENOUS | Status: AC | PRN
  Administered 2017-07-31: 500 [IU]
  Filled 2017-07-31: qty 5

## 2017-07-31 MED ORDER — SODIUM CHLORIDE 0.9 % IV SOLN
620.0000 mg | Freq: Once | INTRAVENOUS | Status: AC
  Administered 2017-07-31: 620 mg via INTRAVENOUS
  Filled 2017-07-31: qty 10

## 2017-07-31 MED ORDER — LEVOFLOXACIN 500 MG PO TABS: 500.0000 mg | ORAL_TABLET | Freq: Every day | ORAL | 0 refills | Status: DC

## 2017-07-31 MED ORDER — SODIUM CHLORIDE 0.9 % IV SOLN
Freq: Once | INTRAVENOUS | Status: AC
  Administered 2017-07-31: 15:00:00 via INTRAVENOUS
  Filled 2017-07-31: qty 1000

## 2017-07-31 MED ORDER — SODIUM CHLORIDE 0.9% FLUSH
10.0000 mL | INTRAVENOUS | Status: DC | PRN
  Administered 2017-07-31: 10 mL
  Filled 2017-07-31: qty 10

## 2017-07-31 NOTE — Progress Notes (Signed)
Patient denies any concerns today.  

## 2017-08-01 LAB — THYROID PANEL WITH TSH
FREE THYROXINE INDEX: 1.1 — AB (ref 1.2–4.9)
T3 UPTAKE RATIO: 17 % — AB (ref 24–39)
T4, Total: 6.3 ug/dL (ref 4.5–12.0)
TSH: 2.29 u[IU]/mL (ref 0.450–4.500)

## 2017-09-02 ENCOUNTER — Encounter: Payer: Self-pay | Admitting: Oncology

## 2017-09-02 NOTE — Progress Notes (Signed)
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  INTERVAL HISTORY: Patient returns to clinic today for further evaluation and consideration of cycle 4 of maintenance durvalumab.  Her most recent treatment was delayed secondary to a trip to the [place].  She continues to have a chronic cough particularly in the mornings that is unchanged. She continues to be anxious. She has no neurologic complaints.  She denies any recent fevers or illnesses.  She has good appetite and denies weight loss.  She has no chest pain, hemoptysis, or shortness of breath.  She denies any nausea, vomiting, constipation, or diarrhea.  She has no urinary complaints.  Patient offers no further specific complaints today.  REVIEW OF SYSTEMS:   Review of Systems  Constitutional: Negative.  Negative for fever, malaise/fatigue and weight loss.  Respiratory: Positive for cough. Negative for hemoptysis and shortness of breath.   Cardiovascular: Negative.  Negative for chest pain and leg swelling.  Gastrointestinal: Negative.  Negative for abdominal pain.  Genitourinary: Negative.   Musculoskeletal: Negative.   Skin: Negative.  Negative for rash.  Neurological: Negative.  Negative for sensory change, focal weakness and weakness.  Psychiatric/Behavioral: The patient is nervous/anxious.     As per HPI. Otherwise, a complete review of systems is negative.  PAST MEDICAL HISTORY: Past Medical History:  Diagnosis Date  . Acid reflux   . Anemia    HAD TO HAVE IRON INFUSIONS BACK IN 2015  . Arthritis    bil hands and back  . Asthma   . Cancer of right lung ([place]) 2015   lung cancer - chemo, Dr Genevive Bi removed RML Lobectomy.   Marland Kitchen Dyspnea    WITH EXERTION  . Hypertension   . Mitral valve  prolapse   . Primary cancer of right middle lobe of lung ([place]) 03/11/2016    PAST SURGICAL HISTORY: Past Surgical History:  Procedure Laterality Date  . ABDOMINAL HYSTERECTOMY    . BREAST BIOPSY Left 1994 (-), 2004 (-)  . COLONOSCOPY  2008    Dr. Donnella Sham  . KNEE SURGERY  2007  . LUNG LOBECTOMY Right    middle lobe  . PORTACATH PLACEMENT Right 02/09/2017   Procedure: INSERTION PORT-A-CATH;  Surgeon: Nestor Lewandowsky, MD;  Location: [facility] ORS;  Service: General;  Laterality: Right;  . SALIVARY GLAND SURGERY Left 1983   with lymph node removal also  . UPPER GI ENDOSCOPY  2008  . VIDEO BRONCHOSCOPY WITH ENDOBRONCHIAL ULTRASOUND Right 01/24/2017   Procedure: VIDEO BRONCHOSCOPY WITH ENDOBRONCHIAL ULTRASOUND;  Surgeon: [name], MD;  Location: [facility] ORS;  Service: Pulmonary;  Laterality: Right;    FAMILY HISTORY: Family History  Problem Relation Age of Onset  . Breast cancer Maternal Aunt   . Breast cancer Paternal Aunt   . Breast cancer Maternal Grandmother   . Breast cancer Maternal Aunt   . Breast cancer Paternal Aunt   . Healthy Mother   . Healthy Father     ADVANCED DIRECTIVES (Y/N):  N  HEALTH MAINTENANCE: Social History   Tobacco Use  . Smoking status: Never Smoker  . Smokeless tobacco: Never Used  Substance Use Topics  . Alcohol use: Yes    Alcohol/week: 4.2 oz    Types: 7 Glasses of wine per week  . Drug use: No  Colonoscopy:  PAP:  Bone density:  Lipid panel:  Allergies  Allergen Reactions  . Penicillins Swelling and Rash    Has patient had a PCN reaction causing immediate rash, facial/tongue/throat swelling, SOB or lightheadedness with hypotension: Unknown Has patient had a PCN reaction causing severe rash involving mucus membranes or skin necrosis: Unknown Has patient had a PCN reaction that required hospitalization: No Has patient had a PCN reaction occurring within the last 10 years: No If all of the above answers are "NO", then may proceed  with Cephalosporin use.    . Sulfa Antibiotics Nausea And Vomiting    Current Outpatient Medications  Medication Sig Dispense Refill  . B Complex Vitamins (VITAMIN B COMPLEX PO) Take 1 tablet by mouth daily.     . Biotin w/ Vitamins C & E (HAIR/SKIN/NAILS PO) Take 1 tablet by mouth daily.    . Calcium Carb-Cholecalciferol (CALCIUM 600+D) 600-800 MG-UNIT TABS Take 2 tablets by mouth daily.    . Cholecalciferol (D3 MAXIMUM STRENGTH) 5000 units capsule Take 5,000 Units by mouth daily.    . COLLAGEN PO Take 1,000 mg by mouth daily.    . fexofenadine (ALLEGRA) 180 MG tablet Take 180 mg by mouth daily as needed for allergies.     . fluticasone (FLONASE) 50 MCG/ACT nasal spray Place 2 sprays into both nostrils daily as needed for allergies or rhinitis.    . Fluticasone-Salmeterol (ADVAIR) 250-50 MCG/DOSE AEPB Inhale 1 puff into the lungs 2 (two) times daily as needed (for respiratory issues.).    Marland Kitchen lansoprazole (PREVACID) 30 MG capsule Take 30 mg by mouth 2 (two) times daily before a meal.     . lidocaine-prilocaine (EMLA) cream Apply 1 application topically as needed. Apply generously over the Mediport 45 minutes prior to chemotherapy. 30 g 0  . losartan (COZAAR) 25 MG tablet Take 25 mg by mouth at bedtime.     Marland Kitchen PREMARIN 1.25 MG tablet   1  . pseudoephedrine-guaifenesin (MUCINEX D) 60-600 MG 12 hr tablet Take 1 tablet by mouth every 12 (twelve) hours.    Marland Kitchen albuterol (PROVENTIL HFA;VENTOLIN HFA) 108 (90 Base) MCG/ACT inhaler Inhale 1-2 puffs into the lungs every 6 (six) hours as needed for wheezing or shortness of breath.    . cyclobenzaprine (FLEXERIL) 5 MG tablet Take 1 tablet (5 mg total) by mouth 3 (three) times daily as needed for muscle spasms. (Patient not taking: Reported on 09/04/2017) 30 tablet 1  . metoCLOPramide (REGLAN) 10 MG tablet Take 1 tablet (10 mg total) by mouth every 8 (eight) hours as needed for nausea. (Patient not taking: Reported on 09/04/2017) 40 tablet 3  . neomycin-polymyxin  b-dexamethasone (MAXITROL) 3.5-10000-0.1 SUSP     . ondansetron (ZOFRAN) 8 MG tablet Take 1 tablet (8 mg total) by mouth every 8 (eight) hours as needed for nausea or vomiting (start 3 days; after chemo). (Patient not taking: Reported on 09/04/2017) 40 tablet 1  . polyethylene glycol (MIRALAX / GLYCOLAX) packet Take 17 g by mouth daily as needed for mild constipation.    . prochlorperazine (COMPAZINE) 10 MG tablet Take 1 tablet (10 mg total) by mouth every 6 (six) hours as needed for nausea or vomiting. (Patient not taking: Reported on 09/04/2017) 40 tablet 1  . traZODone (DESYREL) 50 MG tablet Take 1 tablet by mouth 1 day or 1 dose.     No current facility-administered medications for this visit.     OBJECTIVE: Vitals:   09/04/17 1126  BP: (!) 139/94  Pulse: 86  Resp: 18  Temp: (!) 97.1 F (36.2 C)     Body mass index is 23.21 kg/m.    ECOG FS:0 - Asymptomatic  General: Well-developed, well-nourished, no acute distress. Eyes: Pink conjunctiva, anicteric sclera. HEENT: Normocephalic, moist mucous membranes, clear oropharnyx.  Lungs: Scattered wheezing in right lung otherwise clear to auscultation. Heart: Regular rate and rhythm. No rubs, murmurs, or gallops. Abdomen: Soft, nontender, nondistended. No organomegaly noted, normoactive bowel sounds. Musculoskeletal: No edema, cyanosis, or clubbing. Neuro: Alert, answering all questions appropriately. Cranial nerves grossly intact. Skin: No rashes or petechiae noted. Psych: Normal affect.   LAB RESULTS:  Lab Results  Component Value Date   NA 137 09/04/2017   K 3.9 09/04/2017   CL 104 09/04/2017   CO2 25 09/04/2017   GLUCOSE 86 09/04/2017   BUN 17 09/04/2017   CREATININE 0.72 09/04/2017   CALCIUM 8.9 09/04/2017   PROT 7.3 09/04/2017   ALBUMIN 3.5 09/04/2017   AST 35 09/04/2017   ALT 27 09/04/2017   ALKPHOS 91 09/04/2017   BILITOT 0.7 09/04/2017   GFRNONAA >60 09/04/2017   GFRAA >60 09/04/2017    Lab Results  Component  Value Date   WBC 3.6 09/04/2017   NEUTROABS 2.3 09/04/2017   HGB 12.4 09/04/2017   HCT 35.9 09/04/2017   MCV 88.3 09/04/2017   PLT 269 09/04/2017     STUDIES: No results found.  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  PLAN:   1. 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  She initially received 4 cycles of cisplatin and Alimta completing on January 23, 2014.  Patient was on the [facility] protocol for maintenance Tarceva versus placebo, but discontinued treatment in November 2015 because of significant side effects.  Although blinded, presumption was she was receiving Tarceva.  She recently was noted to have a recurrence confirmed by right paratracheal lymph node biopsy on January 24, 2017.  PET scan on February 03, 2017 did not reveal any distant metastasis.  She underwent treatment with weekly carboplatinum and Taxol along with daily XRT finishing in October 2018.  Current plan is to pursue the maintenance durvalumab every 2 weeks for up to 12 months.  Repeat CT scan results from June 28, 2017 reviewed independently with persistent and increased airspace consolidation in the right lower and middle lobe possible airspace disease.  Proceed with cycle 4 of maintenance durvalumab today.  Return to clinic in 2 weeks for further evaluation and consideration of cycle 5.  We will get repeat imaging with CT scan on September 20, 2017.   2.  Cough: CT results as above.  Patient completed a Medrol Dosepak as well as 2 weeks of antibiotics with only a mild improvement of her symptoms.  Continue Advair inhaler as prescribed.  3.  Tongue ulcer: Resolved.  Approximately 30 minutes was spent in discussion of which greater than 50% was consultation.  Patient expressed understanding and was in agreement with this plan. She also understands that She can call clinic at any time with  any questions, concerns, or complaints.   Cancer Staging No matching staging information was found for the patient.  Lloyd Huger, MD   09/05/2017 2:35 PM

## 2017-09-04 ENCOUNTER — Inpatient Hospital Stay: Payer: Medicare Other

## 2017-09-04 ENCOUNTER — Inpatient Hospital Stay: Payer: Medicare Other | Attending: Oncology

## 2017-09-04 ENCOUNTER — Inpatient Hospital Stay (HOSPITAL_BASED_OUTPATIENT_CLINIC_OR_DEPARTMENT_OTHER): Payer: Medicare Other | Admitting: Oncology

## 2017-09-04 ENCOUNTER — Other Ambulatory Visit: Payer: Self-pay

## 2017-09-04 VITALS — BP 139/94 | HR 86 | Temp 97.1°F | Resp 18 | Wt 135.2 lb

## 2017-09-04 DIAGNOSIS — Z5112 Encounter for antineoplastic immunotherapy: Secondary | ICD-10-CM | POA: Diagnosis present

## 2017-09-04 DIAGNOSIS — C342 Malignant neoplasm of middle lobe, bronchus or lung: Secondary | ICD-10-CM | POA: Diagnosis not present

## 2017-09-04 DIAGNOSIS — Z79899 Other long term (current) drug therapy: Secondary | ICD-10-CM | POA: Diagnosis not present

## 2017-09-04 DIAGNOSIS — C3490 Malignant neoplasm of unspecified part of unspecified bronchus or lung: Secondary | ICD-10-CM

## 2017-09-04 LAB — CBC WITH DIFFERENTIAL/PLATELET
BASOS ABS: 0.1 10*3/uL (ref 0–0.1)
BASOS PCT: 4 %
Eosinophils Absolute: 0.3 10*3/uL (ref 0–0.7)
Eosinophils Relative: 8 %
HCT: 35.9 % (ref 35.0–47.0)
HEMOGLOBIN: 12.4 g/dL (ref 12.0–16.0)
Lymphocytes Relative: 12 %
Lymphs Abs: 0.4 10*3/uL — ABNORMAL LOW (ref 1.0–3.6)
MCH: 30.5 pg (ref 26.0–34.0)
MCHC: 34.5 g/dL (ref 32.0–36.0)
MCV: 88.3 fL (ref 80.0–100.0)
Monocytes Absolute: 0.4 10*3/uL (ref 0.2–0.9)
Monocytes Relative: 11 %
NEUTROS ABS: 2.3 10*3/uL (ref 1.4–6.5)
NEUTROS PCT: 65 %
Platelets: 269 10*3/uL (ref 150–440)
RBC: 4.06 MIL/uL (ref 3.80–5.20)
RDW: 13.3 % (ref 11.5–14.5)
WBC: 3.6 10*3/uL (ref 3.6–11.0)

## 2017-09-04 LAB — COMPREHENSIVE METABOLIC PANEL
ALBUMIN: 3.5 g/dL (ref 3.5–5.0)
ALK PHOS: 91 U/L (ref 38–126)
ALT: 27 U/L (ref 14–54)
ANION GAP: 8 (ref 5–15)
AST: 35 U/L (ref 15–41)
BUN: 17 mg/dL (ref 6–20)
CO2: 25 mmol/L (ref 22–32)
CREATININE: 0.72 mg/dL (ref 0.44–1.00)
Calcium: 8.9 mg/dL (ref 8.9–10.3)
Chloride: 104 mmol/L (ref 101–111)
GFR calc Af Amer: >60 mL/min (ref >60)
GFR calc non Af Amer: >60 mL/min (ref >60)
GLUCOSE: 86 mg/dL (ref 65–99)
Potassium: 3.9 mmol/L (ref 3.5–5.1)
SODIUM: 137 mmol/L (ref 135–145)
Total Bilirubin: 0.7 mg/dL (ref 0.3–1.2)
Total Protein: 7.3 g/dL (ref 6.5–8.1)

## 2017-09-04 MED ORDER — SODIUM CHLORIDE 0.9 % IV SOLN
620.0000 mg | Freq: Once | INTRAVENOUS | Status: AC
  Administered 2017-09-04: 620 mg via INTRAVENOUS
  Filled 2017-09-04: qty 10

## 2017-09-04 MED ORDER — HEPARIN SOD (PORK) LOCK FLUSH 100 UNIT/ML IV SOLN
500.0000 [IU] | Freq: Once | INTRAVENOUS | Status: AC | PRN
  Administered 2017-09-04: 500 [IU]
  Filled 2017-09-04: qty 5

## 2017-09-04 MED ORDER — SODIUM CHLORIDE 0.9 % IV SOLN
Freq: Once | INTRAVENOUS | Status: AC
  Administered 2017-09-04: 12:00:00 via INTRAVENOUS
  Filled 2017-09-04: qty 1000

## 2017-09-04 NOTE — Progress Notes (Signed)
Here for follow up .stated doing well

## 2017-09-05 LAB — THYROID PANEL WITH TSH
FREE THYROXINE INDEX: 1.2 (ref 1.2–4.9)
T3 UPTAKE RATIO: 18 % — AB (ref 24–39)
T4, Total: 6.6 ug/dL (ref 4.5–12.0)
TSH: 2.44 u[IU]/mL (ref 0.450–4.500)

## 2017-09-17 NOTE — Progress Notes (Signed)
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  INTERVAL HISTORY: Patient returns to clinic today for further evaluation and consideration of cycle 5 of maintenance durvalumab.  She continues to have a chronic cough particularly in the mornings that is unchanged. She continues to be anxious. She has no neurologic complaints.  She denies any recent fevers or illnesses.  She has a good appetite and denies weight loss.  She has no chest pain, hemoptysis, or shortness of breath.  She denies any nausea, vomiting, constipation, or diarrhea.  She has no urinary complaints.  Patient offers no further specific complaints today.  REVIEW OF SYSTEMS:   Review of Systems  Constitutional: Negative.  Negative for fever, malaise/fatigue and weight loss.  Respiratory: Positive for cough. Negative for hemoptysis and shortness of breath.   Cardiovascular: Negative.  Negative for chest pain and leg swelling.  Gastrointestinal: Negative.  Negative for abdominal pain.  Genitourinary: Negative.   Musculoskeletal: Negative.   Skin: Negative.  Negative for rash.  Neurological: Negative.  Negative for sensory change, focal weakness and weakness.  Psychiatric/Behavioral: The patient is nervous/anxious.     As per HPI. Otherwise, a complete review of systems is negative.  PAST MEDICAL HISTORY: Past Medical History:  Diagnosis Date  . Acid reflux   . Anemia    HAD TO HAVE IRON INFUSIONS BACK IN 2015  . Arthritis    bil hands and back  . Asthma   . Cancer of right lung ([place]) 2015   lung cancer - chemo, Dr Genevive Bi removed RML Lobectomy.   Marland Kitchen Dyspnea    WITH EXERTION  . Hypertension   . Mitral valve prolapse   . Primary cancer of right middle lobe of lung ([place]) 03/11/2016     PAST SURGICAL HISTORY: Past Surgical History:  Procedure Laterality Date  . ABDOMINAL HYSTERECTOMY    . BREAST BIOPSY Left 1994 (-), 2004 (-)  . COLONOSCOPY  2008    Dr. Donnella Sham  . KNEE SURGERY  2007  . LUNG LOBECTOMY Right    middle lobe  . PORTACATH PLACEMENT Right 02/09/2017   Procedure: INSERTION PORT-A-CATH;  Surgeon: Nestor Lewandowsky, MD;  Location: [facility] ORS;  Service: General;  Laterality: Right;  . SALIVARY GLAND SURGERY Left 1983   with lymph node removal also  . UPPER GI ENDOSCOPY  2008  . VIDEO BRONCHOSCOPY WITH ENDOBRONCHIAL ULTRASOUND Right 01/24/2017   Procedure: VIDEO BRONCHOSCOPY WITH ENDOBRONCHIAL ULTRASOUND;  Surgeon: [name], MD;  Location: [facility] ORS;  Service: Pulmonary;  Laterality: Right;    FAMILY HISTORY: Family History  Problem Relation Age of Onset  . Breast cancer Maternal Aunt   . Breast cancer Paternal Aunt   . Breast cancer Maternal Grandmother   . Breast cancer Maternal Aunt   . Breast cancer Paternal Aunt   . Healthy Mother   . Healthy Father     ADVANCED DIRECTIVES (Y/N):  N  HEALTH MAINTENANCE: Social History   Tobacco Use  . Smoking status: Never Smoker  . Smokeless tobacco: Never Used  Substance Use Topics  . Alcohol use: Yes    Alcohol/week: 4.2 oz    Types: 7 Glasses of wine per week  . Drug use: No     Colonoscopy:  PAP:  Bone density:  Lipid panel:  Allergies  Allergen Reactions  . Penicillins Swelling and Rash    Has patient had a PCN reaction causing immediate rash, facial/tongue/throat swelling, SOB or lightheadedness with hypotension: Unknown Has patient had a PCN reaction causing severe rash involving mucus membranes or skin necrosis: Unknown Has patient had a PCN reaction that required hospitalization: No Has patient had a PCN reaction occurring within the last 10 years: No If all of the above answers are "NO", then may proceed with Cephalosporin use.    . Sulfa Antibiotics Nausea And Vomiting     Current Outpatient Medications  Medication Sig Dispense Refill  . B Complex Vitamins (VITAMIN B COMPLEX PO) Take 1 tablet by mouth daily.     . Biotin w/ Vitamins C & E (HAIR/SKIN/NAILS PO) Take 1 tablet by mouth daily.    . Calcium Carb-Cholecalciferol (CALCIUM 600+D) 600-800 MG-UNIT TABS Take 2 tablets by mouth daily.    . Cholecalciferol (D3 MAXIMUM STRENGTH) 5000 units capsule Take 5,000 Units by mouth daily.    . COLLAGEN PO Take 1,000 mg by mouth daily.    . Fluticasone-Salmeterol (ADVAIR) 250-50 MCG/DOSE AEPB Inhale 1 puff into the lungs 2 (two) times daily as needed (for respiratory issues.).    Marland Kitchen lansoprazole (PREVACID) 30 MG capsule Take 30 mg by mouth 2 (two) times daily before a meal.     . lidocaine-prilocaine (EMLA) cream Apply 1 application topically as needed. Apply generously over the Mediport 45 minutes prior to chemotherapy. 30 g 0  . losartan (COZAAR) 25 MG tablet Take 25 mg by mouth at bedtime.     Marland Kitchen PREMARIN 1.25 MG tablet   1  . pseudoephedrine-guaifenesin (MUCINEX D) 60-600 MG 12 hr tablet Take 1 tablet by mouth every 12 (twelve) hours.    Marland Kitchen albuterol (PROVENTIL HFA;VENTOLIN HFA) 108 (90 Base) MCG/ACT inhaler Inhale 1-2 puffs into the lungs every 6 (six) hours as needed for wheezing or shortness of breath.    . cyclobenzaprine (FLEXERIL) 5 MG tablet Take 1 tablet (5 mg total) by mouth 3 (three) times daily as needed for muscle spasms. (Patient not taking: Reported on 09/04/2017) 30 tablet 1  . fexofenadine (ALLEGRA) 180 MG tablet Take 180 mg by mouth daily as needed for allergies.     . fluticasone (FLONASE) 50 MCG/ACT nasal spray Place 2 sprays into both nostrils daily as needed for allergies or rhinitis.    Marland Kitchen metoCLOPramide (REGLAN) 10 MG tablet Take 1 tablet (10 mg total) by mouth every 8 (eight) hours as needed for nausea. (Patient not taking: Reported on 09/04/2017) 40 tablet 3  . ondansetron (ZOFRAN) 8 MG tablet Take 1 tablet (8 mg total) by mouth every 8 (eight)  hours as needed for nausea or vomiting (start 3 days; after chemo). (Patient not taking: Reported on 09/04/2017) 40 tablet 1  . polyethylene glycol (MIRALAX / GLYCOLAX) packet Take 17 g by mouth daily as needed for mild constipation.    . prochlorperazine (COMPAZINE) 10 MG tablet Take 1 tablet (10 mg total) by mouth every 6 (six) hours as needed for nausea or vomiting. (Patient not taking: Reported on 09/04/2017) 40 tablet 1   No current facility-administered medications for this visit.    Facility-Administered Medications Ordered in Other Visits  Medication Dose Route Frequency Provider Last Rate Last Dose  . heparin lock flush 100 unit/mL  500 Units Intravenous Once Lloyd Huger, MD        OBJECTIVE: Vitals:   09/18/17 1004  BP: (!) 145/96  Pulse: 73  Resp: 18  Temp: (!) 96.6 F (35.9 C)     Body mass index is 23.05 kg/m.    ECOG FS:0 - Asymptomatic  General: Well-developed, well-nourished, no acute distress. Eyes: Pink conjunctiva, anicteric sclera. HEENT: Normocephalic, moist mucous membranes, clear oropharnyx.  Lungs: Scattered wheezing in right lung otherwise clear to auscultation. Heart: Regular rate and rhythm. No rubs, murmurs, or gallops. Abdomen: Soft, nontender, nondistended. No organomegaly noted, normoactive bowel sounds. Musculoskeletal: No edema, cyanosis, or clubbing. Neuro: Alert, answering all questions appropriately. Cranial nerves grossly intact. Skin: No rashes or petechiae noted. Psych: Normal affect.   LAB RESULTS:  Lab Results  Component Value Date   NA 136 09/18/2017   K 3.9 09/18/2017   CL 103 09/18/2017   CO2 24 09/18/2017   GLUCOSE 100 (H) 09/18/2017   BUN 14 09/18/2017   CREATININE 0.67 09/18/2017   CALCIUM 8.8 (L) 09/18/2017   PROT 6.9 09/18/2017   ALBUMIN 3.5 09/18/2017   AST 41 09/18/2017   ALT 32 09/18/2017   ALKPHOS 75 09/18/2017   BILITOT 0.6 09/18/2017   GFRNONAA >60 09/18/2017   GFRAA >60 09/18/2017    Lab Results   Component Value Date   WBC 3.4 (L) 09/18/2017   NEUTROABS 2.3 09/18/2017   HGB 12.0 09/18/2017   HCT 35.2 09/18/2017   MCV 88.5 09/18/2017   PLT 259 09/18/2017     STUDIES: No results found.  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  PLAN:   1. 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  She initially received 4 cycles of cisplatin and Alimta completing on January 23, 2014.  Patient was on the [facility] protocol for maintenance Tarceva versus placebo, but discontinued treatment in November 2015 because of significant side effects.  Although blinded, presumption was she was receiving Tarceva.  She recently was noted to have a recurrence confirmed by right paratracheal lymph node biopsy on January 24, 2017.  PET scan on February 03, 2017 did not reveal any distant metastasis.  She underwent treatment with weekly carboplatinum and Taxol along with daily XRT finishing in October 2018.  Current plan is to pursue the maintenance durvalumab every 2 weeks for up to 12 months. CT scan results from June 28, 2017 reviewed independently with persistent and increased airspace consolidation in the right lower and middle lobe possible airspace disease.  Proceed with cycle 5 of maintenance durvalumab today.  Patient has a restaging CT scan scheduled for September 20, 2017 and follow-up with radiation oncology the next day.  Return to clinic in 2 weeks for consideration of cycle 7 of durvalumab and then in 4 weeks for further evaluation and consideration of cycle 8.   2.  Cough: Repeat CT scan as above.  Patient completed a Medrol Dosepak as well as 2 weeks of antibiotics with only a mild improvement of her symptoms.  Continue Advair inhaler as prescribed.  3.  Tongue ulcer: Resolved.  Approximately 30 minutes was spent in discussion of which greater than 50% was  consultation.  Patient expressed understanding and was in agreement with this plan. She also understands that She can call clinic at any time with any questions, concerns, or complaints.   Cancer Staging No matching staging information was found for the patient.  Lloyd Huger, MD   09/18/2017 10:26 AM

## 2017-09-18 ENCOUNTER — Inpatient Hospital Stay: Payer: Medicare Other

## 2017-09-18 ENCOUNTER — Other Ambulatory Visit: Payer: Self-pay

## 2017-09-18 ENCOUNTER — Other Ambulatory Visit: Payer: Self-pay | Admitting: *Deleted

## 2017-09-18 ENCOUNTER — Inpatient Hospital Stay (HOSPITAL_BASED_OUTPATIENT_CLINIC_OR_DEPARTMENT_OTHER): Payer: Medicare Other | Admitting: Oncology

## 2017-09-18 VITALS — BP 145/96 | HR 73 | Temp 96.6°F | Resp 18 | Wt 134.3 lb

## 2017-09-18 DIAGNOSIS — Z5112 Encounter for antineoplastic immunotherapy: Secondary | ICD-10-CM | POA: Diagnosis not present

## 2017-09-18 DIAGNOSIS — C342 Malignant neoplasm of middle lobe, bronchus or lung: Secondary | ICD-10-CM

## 2017-09-18 DIAGNOSIS — C3490 Malignant neoplasm of unspecified part of unspecified bronchus or lung: Secondary | ICD-10-CM

## 2017-09-18 LAB — CBC WITH DIFFERENTIAL/PLATELET
Basophils Absolute: 0 10*3/uL (ref 0–0.1)
Basophils Relative: 1 %
EOS ABS: 0.2 10*3/uL (ref 0–0.7)
Eosinophils Relative: 7 %
HEMATOCRIT: 35.2 % (ref 35.0–47.0)
HEMOGLOBIN: 12 g/dL (ref 12.0–16.0)
LYMPHS ABS: 0.6 10*3/uL — AB (ref 1.0–3.6)
LYMPHS PCT: 16 %
MCH: 30.1 pg (ref 26.0–34.0)
MCHC: 34.1 g/dL (ref 32.0–36.0)
MCV: 88.5 fL (ref 80.0–100.0)
Monocytes Absolute: 0.3 10*3/uL (ref 0.2–0.9)
Monocytes Relative: 9 %
NEUTROS ABS: 2.3 10*3/uL (ref 1.4–6.5)
NEUTROS PCT: 67 %
Platelets: 259 10*3/uL (ref 150–440)
RBC: 3.98 MIL/uL (ref 3.80–5.20)
RDW: 14.1 % (ref 11.5–14.5)
WBC: 3.4 10*3/uL — ABNORMAL LOW (ref 3.6–11.0)

## 2017-09-18 LAB — COMPREHENSIVE METABOLIC PANEL
ALT: 32 U/L (ref 14–54)
AST: 41 U/L (ref 15–41)
Albumin: 3.5 g/dL (ref 3.5–5.0)
Alkaline Phosphatase: 75 U/L (ref 38–126)
Anion gap: 9 (ref 5–15)
BUN: 14 mg/dL (ref 6–20)
CHLORIDE: 103 mmol/L (ref 101–111)
CO2: 24 mmol/L (ref 22–32)
CREATININE: 0.67 mg/dL (ref 0.44–1.00)
Calcium: 8.8 mg/dL — ABNORMAL LOW (ref 8.9–10.3)
GFR calc non Af Amer: >60 mL/min (ref >60)
Glucose, Bld: 100 mg/dL — ABNORMAL HIGH (ref 65–99)
POTASSIUM: 3.9 mmol/L (ref 3.5–5.1)
SODIUM: 136 mmol/L (ref 135–145)
Total Bilirubin: 0.6 mg/dL (ref 0.3–1.2)
Total Protein: 6.9 g/dL (ref 6.5–8.1)

## 2017-09-18 MED ORDER — SODIUM CHLORIDE 0.9 % IV SOLN
620.0000 mg | Freq: Once | INTRAVENOUS | Status: AC
  Administered 2017-09-18: 620 mg via INTRAVENOUS
  Filled 2017-09-18: qty 10

## 2017-09-18 MED ORDER — HEPARIN SOD (PORK) LOCK FLUSH 100 UNIT/ML IV SOLN
500.0000 [IU] | Freq: Once | INTRAVENOUS | Status: AC
  Administered 2017-09-18: 500 [IU] via INTRAVENOUS
  Filled 2017-09-18: qty 5

## 2017-09-18 MED ORDER — SODIUM CHLORIDE 0.9 % IV SOLN
Freq: Once | INTRAVENOUS | Status: AC
  Administered 2017-09-18: 11:00:00 via INTRAVENOUS
  Filled 2017-09-18: qty 1000

## 2017-09-18 MED ORDER — SODIUM CHLORIDE 0.9% FLUSH
10.0000 mL | Freq: Once | INTRAVENOUS | Status: AC
  Administered 2017-09-18: 10 mL via INTRAVENOUS
  Filled 2017-09-18: qty 10

## 2017-09-18 NOTE — Progress Notes (Signed)
Here for follow up. Stated over all doing well  

## 2017-09-19 LAB — THYROID PANEL WITH TSH
Free Thyroxine Index: 1.2 (ref 1.2–4.9)
T3 UPTAKE RATIO: 19 % — AB (ref 24–39)
T4 TOTAL: 6.3 ug/dL (ref 4.5–12.0)
TSH: 3.86 u[IU]/mL (ref 0.450–4.500)

## 2017-09-20 ENCOUNTER — Ambulatory Visit
Admission: RE | Admit: 2017-09-20 | Discharge: 2017-09-20 | Disposition: A | Discharge status: Home / Self Care | Payer: Medicare Other | Source: Ambulatory Visit | Attending: Oncology | Admitting: Oncology

## 2017-09-20 DIAGNOSIS — J90 Pleural effusion, not elsewhere classified: Secondary | ICD-10-CM | POA: Insufficient documentation

## 2017-09-20 DIAGNOSIS — M899 Disorder of bone, unspecified: Secondary | ICD-10-CM | POA: Insufficient documentation

## 2017-09-20 DIAGNOSIS — C342 Malignant neoplasm of middle lobe, bronchus or lung: Secondary | ICD-10-CM | POA: Diagnosis present

## 2017-09-20 DIAGNOSIS — I700 Atherosclerosis of aorta: Secondary | ICD-10-CM | POA: Insufficient documentation

## 2017-09-20 DIAGNOSIS — J439 Emphysema, unspecified: Secondary | ICD-10-CM | POA: Diagnosis not present

## 2017-09-20 MED ORDER — IOPAMIDOL (ISOVUE-300) INJECTION 61%
75.0000 mL | Freq: Once | INTRAVENOUS | Status: AC | PRN
  Administered 2017-09-20: 75 mL via INTRAVENOUS

## 2017-09-21 ENCOUNTER — Other Ambulatory Visit: Payer: Self-pay

## 2017-09-21 ENCOUNTER — Encounter: Payer: Self-pay | Admitting: Radiation Oncology

## 2017-09-21 ENCOUNTER — Ambulatory Visit
Admission: RE | Admit: 2017-09-21 | Discharge: 2017-09-21 | Disposition: A | Discharge status: Home / Self Care | Payer: Medicare Other | Source: Ambulatory Visit | Attending: Radiation Oncology | Admitting: Radiation Oncology

## 2017-09-21 ENCOUNTER — Other Ambulatory Visit: Payer: Self-pay | Admitting: *Deleted

## 2017-09-21 VITALS — BP 134/91 | HR 85 | Temp 96.9°F | Resp 18 | Wt 135.4 lb

## 2017-09-21 DIAGNOSIS — Z923 Personal history of irradiation: Secondary | ICD-10-CM | POA: Diagnosis not present

## 2017-09-21 DIAGNOSIS — C342 Malignant neoplasm of middle lobe, bronchus or lung: Secondary | ICD-10-CM

## 2017-09-21 DIAGNOSIS — J90 Pleural effusion, not elsewhere classified: Secondary | ICD-10-CM | POA: Diagnosis not present

## 2017-09-21 NOTE — Progress Notes (Signed)
Radiation Oncology Follow up Note  Name: Lauren Page   Date:   09/21/2017 MRN:  [id number] DOB: 02-26-1951    This 67 y.o. female presents to the clinic today for 5 month follow-up status post salvage radiation therapy for adenocarcinoma the right middle lobe status post rightmiddle lobectomy in 2015.  REFERRING PROVIDER: [name], MD  HPI: patient is a 67 year old female now seen out 5 months having completed salvage radiation therapy to the right lung status post biopsy-proven recurrence of adenocarcinoma status post right middle lobectomy back in May 2015 seen today in routine follow-up she's been having some significant cough no hemoptysis she also feels somewhat short of breath..he has CT scan yesterday shows interval increase in consolidation with my little right middle lobe most compatible with post radiation change. There is also increase in size of right pleural effusion. There is also stable nodules within the right lung. She specifically denies hemoptysis.  COMPLICATIONS OF TREATMENT: none  FOLLOW UP COMPLIANCE: keeps appointments   PHYSICAL EXAM:  BP (!) 134/91   Pulse 85   Temp (!) 96.9 F (36.1 C)   Resp 18   Wt 135 lb 5.8 oz (61.4 kg)   BMI 23.23 kg/m  Well-developed well-nourished patient in NAD. HEENT reveals PERLA, EOMI, discs not visualized.  Oral cavity is clear. No oral mucosal lesions are identified. Neck is clear without evidence of cervical or supraclavicular adenopathy. Lungs are clear to A&P. Cardiac examination is essentially unremarkable with regular rate and rhythm without murmur rub or thrill. Abdomen is benign with no organomegaly or masses noted. Motor sensory and DTR levels are equal and symmetric in the upper and lower extremities. Cranial nerves II through XII are grossly intact. Proprioception is intact. No peripheral adenopathy or edema is identified. No motor or sensory levels are noted. Crude visual fields are within normal  range.  RADIOLOGY RESULTS: CT scans are reviewed and compatible with the above-stated findings  PLAN: present time patient is doing fairly well with changes consistent with radiation in her right chest. Am concerned about the pleural effusion I'm referring her to pulmonology for possible thoracentesis. She also has a follow-up appointment about a month with medical oncology and moving that up for their review of her CT scan. I've otherwise asked to see her back in 4-5 months for follow-up. Patient and husband are both comfortable my treatment plan and recommendations.  I would like to take this opportunity to thank you for allowing me to participate in the care of your patient.Noreene Filbert, MD

## 2017-09-24 NOTE — Progress Notes (Signed)
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  INTERVAL HISTORY: Patient returns to clinic today as an add-on for discussion of her imaging and thoracentesis results.  She is scheduled for cycle 6 of maintenance durvalumab next week.  She continues to have a chronic cough particularly in the mornings that is unchanged. She continues to be anxious. She has no neurologic complaints.  She denies any recent fevers or illnesses.  She has a good appetite and denies weight loss.  She has no chest pain, hemoptysis, or shortness of breath.  She denies any nausea, vomiting, constipation, or diarrhea.  She has no urinary complaints.  Patient offers no further specific complaints today.  REVIEW OF SYSTEMS:   Review of Systems  Constitutional: Negative.  Negative for fever, malaise/fatigue and weight loss.  Respiratory: Positive for cough. Negative for hemoptysis and shortness of breath.   Cardiovascular: Negative.  Negative for chest pain and leg swelling.  Gastrointestinal: Negative.  Negative for abdominal pain.  Genitourinary: Negative.   Musculoskeletal: Negative.   Skin: Negative.  Negative for rash.  Neurological: Negative.  Negative for sensory change, focal weakness and weakness.  Psychiatric/Behavioral: The patient is nervous/anxious.     As per HPI. Otherwise, a complete review of systems is negative.  PAST MEDICAL HISTORY: Past Medical History:  Diagnosis Date  . Acid reflux   . Anemia    HAD TO HAVE IRON INFUSIONS BACK IN 2015  . Arthritis    bil hands and back  . Asthma   . Cancer of right lung ([place]) 2015   lung cancer - chemo, Dr Genevive Bi removed RML Lobectomy.   Marland Kitchen Dyspnea    WITH EXERTION  . Hypertension   . Mitral valve prolapse   .  Primary cancer of right middle lobe of lung ([place]) 03/11/2016    PAST SURGICAL HISTORY: Past Surgical History:  Procedure Laterality Date  . ABDOMINAL HYSTERECTOMY    . BREAST BIOPSY Left 1994 (-), 2004 (-)  . COLONOSCOPY  2008    Dr. Donnella Sham  . KNEE SURGERY  2007  . LUNG LOBECTOMY Right    middle lobe  . PORTACATH PLACEMENT Right 02/09/2017   Procedure: INSERTION PORT-A-CATH;  Surgeon: Nestor Lewandowsky, MD;  Location: [facility] ORS;  Service: General;  Laterality: Right;  . SALIVARY GLAND SURGERY Left 1983   with lymph node removal also  . UPPER GI ENDOSCOPY  2008  . VIDEO BRONCHOSCOPY WITH ENDOBRONCHIAL ULTRASOUND Right 01/24/2017   Procedure: VIDEO BRONCHOSCOPY WITH ENDOBRONCHIAL ULTRASOUND;  Surgeon: [name], MD;  Location: [facility] ORS;  Service: Pulmonary;  Laterality: Right;    FAMILY HISTORY: Family History  Problem Relation Age of Onset  . Breast cancer Maternal Aunt   . Breast cancer Paternal Aunt   . Breast cancer Maternal Grandmother   . Breast cancer Maternal Aunt   . Breast cancer Paternal Aunt   . Healthy Mother   . Healthy Father     ADVANCED DIRECTIVES (Y/N):  N  HEALTH MAINTENANCE: Social History   Tobacco Use  . Smoking status: Never Smoker  . Smokeless tobacco: Never Used  Substance Use Topics  . Alcohol use: Yes    Alcohol/week: 4.2 oz    Types: 7 Glasses of wine per week  . Drug use: No  Colonoscopy:  PAP:  Bone density:  Lipid panel:  Allergies  Allergen Reactions  . Penicillins Swelling and Rash    Has patient had a PCN reaction causing immediate rash, facial/tongue/throat swelling, SOB or lightheadedness with hypotension: Unknown Has patient had a PCN reaction causing severe rash involving mucus membranes or skin necrosis: Unknown Has patient had a PCN reaction that required hospitalization: No Has patient had a PCN reaction occurring within the last 10 years: No If all of the above answers are "NO", then may proceed with  Cephalosporin use.    . Sulfa Antibiotics Nausea And Vomiting    Current Outpatient Medications  Medication Sig Dispense Refill  . albuterol (PROVENTIL HFA;VENTOLIN HFA) 108 (90 Base) MCG/ACT inhaler Inhale 1-2 puffs into the lungs every 6 (six) hours as needed for wheezing or shortness of breath.    . B Complex Vitamins (VITAMIN B COMPLEX PO) Take 1 tablet by mouth daily.     . Biotin w/ Vitamins C & E (HAIR/SKIN/NAILS PO) Take 1 tablet by mouth daily.    . Calcium Carb-Cholecalciferol (CALCIUM 600+D) 600-800 MG-UNIT TABS Take 2 tablets by mouth daily.    . Cholecalciferol (D3 MAXIMUM STRENGTH) 5000 units capsule Take 5,000 Units by mouth daily.    . COLLAGEN PO Take 1,000 mg by mouth daily.    . cyclobenzaprine (FLEXERIL) 5 MG tablet Take 1 tablet (5 mg total) by mouth 3 (three) times daily as needed for muscle spasms. 30 tablet 1  . fexofenadine (ALLEGRA) 180 MG tablet Take 180 mg by mouth daily as needed for allergies.     . fluticasone (FLONASE) 50 MCG/ACT nasal spray Place 2 sprays into both nostrils daily as needed for allergies or rhinitis.    . Fluticasone-Salmeterol (ADVAIR) 250-50 MCG/DOSE AEPB Inhale 1 puff into the lungs 2 (two) times daily as needed (for respiratory issues.).    Marland Kitchen lansoprazole (PREVACID) 30 MG capsule Take 30 mg by mouth 2 (two) times daily before a meal.     . lidocaine-prilocaine (EMLA) cream Apply 1 application topically as needed. Apply generously over the Mediport 45 minutes prior to chemotherapy. 30 g 0  . losartan (COZAAR) 25 MG tablet Take 25 mg by mouth at bedtime.     . metoCLOPramide (REGLAN) 10 MG tablet Take 1 tablet (10 mg total) by mouth every 8 (eight) hours as needed for nausea. 40 tablet 3  . ondansetron (ZOFRAN) 8 MG tablet Take 1 tablet (8 mg total) by mouth every 8 (eight) hours as needed for nausea or vomiting (start 3 days; after chemo). 40 tablet 1  . polyethylene glycol (MIRALAX / GLYCOLAX) packet Take 17 g by mouth daily as needed for  mild constipation.    Marland Kitchen PREMARIN 1.25 MG tablet   1  . prochlorperazine (COMPAZINE) 10 MG tablet Take 1 tablet (10 mg total) by mouth every 6 (six) hours as needed for nausea or vomiting. 40 tablet 1  . pseudoephedrine-guaifenesin (MUCINEX D) 60-600 MG 12 hr tablet Take 1 tablet by mouth every 12 (twelve) hours.     No current facility-administered medications for this visit.     OBJECTIVE: Vitals:   09/28/17 0947  BP: (!) 141/93  Pulse: 86  Temp: (!) 96.7 F (35.9 C)     Body mass index is 22.9 kg/m.    ECOG FS:0 - Asymptomatic  General: Well-developed, well-nourished, no acute distress. Eyes: Pink conjunctiva, anicteric sclera. HEENT: Normocephalic, moist mucous membranes, clear oropharnyx.  Lungs: Scattered wheezing in right lung otherwise  clear to auscultation. Heart: Regular rate and rhythm. No rubs, murmurs, or gallops. Abdomen: Soft, nontender, nondistended. No organomegaly noted, normoactive bowel sounds. Musculoskeletal: No edema, cyanosis, or clubbing. Neuro: Alert, answering all questions appropriately. Cranial nerves grossly intact. Skin: No rashes or petechiae noted. Psych: Normal affect.   LAB RESULTS:  Lab Results  Component Value Date   NA 136 09/18/2017   K 3.9 09/18/2017   CL 103 09/18/2017   CO2 24 09/18/2017   GLUCOSE 100 (H) 09/18/2017   BUN 14 09/18/2017   CREATININE 0.67 09/18/2017   CALCIUM 8.8 (L) 09/18/2017   PROT 6.9 09/18/2017   ALBUMIN 3.5 09/18/2017   AST 41 09/18/2017   ALT 32 09/18/2017   ALKPHOS 75 09/18/2017   BILITOT 0.6 09/18/2017   GFRNONAA >60 09/18/2017   GFRAA >60 09/18/2017    Lab Results  Component Value Date   WBC 3.4 (L) 09/18/2017   NEUTROABS 2.3 09/18/2017   HGB 12.0 09/18/2017   HCT 35.2 09/18/2017   MCV 88.5 09/18/2017   PLT 259 09/18/2017     STUDIES: Dg Chest 1 View  Result Date: 09/28/2017 CLINICAL DATA:  67 year old female status post ultrasound-guided right side thoracentesis. Lung cancer. EXAM:  CHEST  1 VIEW COMPARISON:  Ultrasound thoracentesis images from 1332 hours the same day. Chest CT 09/20/2017. FINDINGS: No pneumothorax. Small or trace residual right pleural effusion. Stable architectural distortion about the right hilum. Stable right chest porta cath. The left lung appears clear. No new pulmonary opacity. No acute osseous abnormality identified. Negative visible bowel gas pattern. IMPRESSION: 1. No pneumothorax or adverse features status post ultrasound-guided right thoracentesis. 2. No new cardiopulmonary abnormality. Electronically Signed   By: Genevie Ann M.D.   On: 09/28/2017 00:30   Ct Chest W Contrast  Result Date: 09/20/2017 CLINICAL DATA:  Patient with history of lung cancer. Follow-up evaluation. EXAM: CT CHEST WITH CONTRAST TECHNIQUE: Multidetector CT imaging of the chest was performed during intravenous contrast administration. CONTRAST:  3m ISOVUE-300 IOPAMIDOL (ISOVUE-300) INJECTION 61% COMPARISON:  Chest CT 06/28/2017. FINDINGS: Cardiovascular: Right anterior chest wall Port-A-Cath is present with tip terminating in the superior vena cava. Normal heart size. Trace fluid superior pericardial recess. Ascending thoracic aorta measures 3.7 cm (image 64; series 2). Thoracic aortic vascular calcifications. Mediastinum/Nodes: No enlarged axillary, mediastinal or hilar lymphadenopathy. Esophagus is normal in appearance. Stable 7 mm precarinal lymph node (image 46; series 2). Similar 1.0 cm right hilar lymph node (image 51; series 2). Lungs/Pleura: Central airways are patent. Stable 7 mm ground-glass nodule anterior right lower lobe (image 73; series 3). Dependent atelectasis bilateral lower lobes. Biapical pleuroparenchymal thickening/scarring. Interval increase in bandlike right paratracheal reticulation and consolidation with associated bronchiectasis most compatible with postradiation changes. Interval increase in size of moderate layering right pleural effusion. Grossly unchanged  subtle nodularity along the right fissure. Upper Abdomen: Stable subcentimeter low-attenuation lesion right hepatic lobe (image 123; series 2), too small to characterize. No acute process. Musculoskeletal: Re demonstrated patchy sclerosis within the anterior right sixth rib (image 114; series 3). Thoracic spine degenerative changes. IMPRESSION: 1. Interval increase in sharply marginated consolidation within the medial right lung with associated bronchiectasis most compatible with post radiation changes. Underlying recurrent disease is not excluded. 2. Interval increase in size of moderate layering right pleural effusion. 3. Stable small nodules within the right lung. 4. Stable sclerotic lesion within the anterior right sixth rib. 5. Aortic Atherosclerosis (ICD10-I70.0) and Emphysema (ICD10-J43.9). Electronically Signed   By: DPolly CobiaD.  On: 09/20/2017 17:00   US Thoracentesis Asp Pleural Space W/img Guide  Result Date: 09/27/2017 INDICATION: 67 year old with history of lung cancer and right pleural effusion. Request for diagnostic thoracentesis. EXAM: ULTRASOUND GUIDED RIGHT THORACENTESIS MEDICATIONS: None. COMPLICATIONS: None immediate. PROCEDURE: An ultrasound guided thoracentesis was thoroughly discussed with the patient and questions answered. The benefits, risks, alternatives and complications were also discussed. The patient understands and wishes to proceed with the procedure. Written consent was obtained. Ultrasound was performed to localize and mark an adequate pocket of fluid in the right chest. The area was then prepped and draped in the normal sterile fashion. 1% Lidocaine was used for local anesthesia. Under ultrasound guidance a 19 gauge, 7-cm, Yueh catheter was introduced. Thoracentesis was performed. The catheter was removed and a dressing applied. FINDINGS: A total of approximately 200 mL of yellow fluid was removed. Samples were sent to the laboratory as requested by the clinical team.  IMPRESSION: Successful ultrasound guided right thoracentesis yielding 200 mL of pleural fluid. Electronically Signed   By: Markus Daft M.D.   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  PLAN:   1. 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  She initially received 4 cycles of cisplatin and Alimta completing on January 23, 2014.  Patient was on the [facility] protocol for maintenance Tarceva versus placebo, but discontinued treatment in November 2015 because of significant side effects.  Although blinded, presumption was she was receiving Tarceva.  She recently was noted to have a recurrence confirmed by right paratracheal lymph node biopsy on January 24, 2017.  PET scan on February 03, 2017 did not reveal any distant metastasis.  She underwent treatment with weekly carboplatinum and Taxol along with daily XRT finishing in October 2018.  CT scan results from September 20, 2017 reviewed independently report as above with increase in patient's pleural fluid.  Proceed with cycle 6 of maintenance durvalumab next week.  Patient will then return to clinic 2 weeks later for further evaluation and consideration of cycle 7.   2.  Cough: Repeat CT scan as above.  Patient has also been evaluated by pulmonary who recommended thoracentesis which removed 200 mL fluid.  The day after patient's evaluation, the pleural fluid was reported positive for malignant cells.  Previously she was instructed to keep her treatment as scheduled on Monday.  We will discuss whether or not to switch treatments at her next clinic visit.     Approximately 30 minutes was spent in discussion of which greater than 50% was consultation.  Patient expressed understanding and was in agreement with this plan. She also understands that She can call clinic at any time with any questions, concerns, or  complaints.   Cancer Staging No matching staging information was found for the patient.  Lloyd Huger, MD   10/01/2017 9:13 PM

## 2017-09-26 ENCOUNTER — Ambulatory Visit (INDEPENDENT_AMBULATORY_CARE_PROVIDER_SITE_OTHER): Payer: Medicare Other | Admitting: Internal Medicine

## 2017-09-26 ENCOUNTER — Encounter: Payer: Self-pay | Admitting: Internal Medicine

## 2017-09-26 VITALS — BP 140/84 | HR 90

## 2017-09-26 DIAGNOSIS — J90 Pleural effusion, not elsewhere classified: Secondary | ICD-10-CM

## 2017-09-26 DIAGNOSIS — J189 Pneumonia, unspecified organism: Secondary | ICD-10-CM | POA: Diagnosis not present

## 2017-09-26 NOTE — Patient Instructions (Signed)
Continue with Advair Use ALBUTEROL  2-4 puffs every 4-6 hrs as needed  US guided Thoracentesis pending

## 2017-09-26 NOTE — Progress Notes (Signed)
[facility] Consultation    Update: Pt seen and evaluated before pt going for procedure, new changes in exam have been noted.     Date: 09/26/2017  MRN# [id number] Lauren Page 05-03-1951    Lauren Page is a 67 y.o. old female seen in consultation for chief complaint of:    CC follow up Abnormal CT chest  HPI:   67 year old woman who is status post right middle lobectomy several years ago. She is now about 2-1/2 years out from a right thoracotomy and right middle lobectomy for a adenocarcinoma the lung with pleural invasion, she then completed 4 cycles of chemotherapy on January 23, 2014   About a year ago she did have a small nodule identified along the superior segment right lower lobe close to the area of the fissure, she was found to have a nodular density in the superior segment of the right lower lobe. A subsequent PET scan was performed which revealed no evidence of uptake in the hypermetabolic range.   A repeat CT scan July 2018 There are some bilateral hilar adenopathy which is increased slightly in size.  The largest lymph node now measures over 1.5 cm in the right hilum. The left hilum has a 9 mm lymph node. These have increased from before.   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  albuterol  (PROVENTIL HFA;VENTOLIN HFA) 108 (90 Base) MCG/ACT inhaler, Inhale 1-2 puffs into the lungs every 6 (six) hours as needed for wheezing or shortness of breath., Disp: , Rfl:  .  B Complex Vitamins (VITAMIN B COMPLEX PO), Take 1 tablet by mouth daily. , Disp: , Rfl:  .  Biotin w/ Vitamins C & E (HAIR/SKIN/NAILS PO), Take 1 tablet by mouth daily., Disp: , Rfl:  .  Calcium Carb-Cholecalciferol (CALCIUM 600+D) 600-800 MG-UNIT TABS, Take 2 tablets by mouth daily., Disp: , Rfl:  .  Cholecalciferol (D3 MAXIMUM STRENGTH) 5000 units capsule, Take 5,000 Units by mouth daily., Disp: , Rfl:  .  COLLAGEN PO, Take 1,000 mg by mouth daily., Disp: , Rfl:  .  cyclobenzaprine (FLEXERIL) 5 MG tablet, Take 1 tablet (5 mg total) by mouth 3 (three) times daily as needed for muscle spasms., Disp: 30 tablet, Rfl: 1 .  fexofenadine (ALLEGRA) 180 MG tablet, Take 180 mg by mouth daily as needed for allergies. , Disp: , Rfl:  .  fluticasone (FLONASE) 50 MCG/ACT nasal spray, Place 2 sprays into both nostrils daily as needed for allergies or rhinitis., Disp: , Rfl:  .  Fluticasone-Salmeterol (ADVAIR) 250-50 MCG/DOSE AEPB, Inhale 1 puff into the lungs 2 (two) times daily as needed (for respiratory issues.)., Disp: , Rfl:  .  lansoprazole (PREVACID) 30 MG capsule, Take 30 mg by mouth 2 (two) times daily before a meal. , Disp: , Rfl:  .  lidocaine-prilocaine (EMLA) cream, Apply 1 application topically as needed. Apply generously over the Mediport 45 minutes prior to chemotherapy., Disp: 30 g, Rfl: 0 .  losartan (COZAAR) 25 MG tablet, Take 25 mg by mouth at bedtime. , Disp: , Rfl:  .  metoCLOPramide (REGLAN) 10 MG tablet, Take 1 tablet (10 mg total) by mouth every 8 (eight) hours as needed for nausea., Disp: 40 tablet, Rfl: 3 .  ondansetron (ZOFRAN) 8 MG tablet, Take 1 tablet (8 mg total) by mouth every 8 (eight) hours as needed for nausea or vomiting (start 3 days; after chemo)., Disp: 40 tablet, Rfl: 1 .  polyethylene glycol (MIRALAX  / GLYCOLAX) packet, Take 17 g by mouth daily as needed for mild constipation., Disp: , Rfl:  .  PREMARIN 1.25 MG tablet, , Disp: , Rfl: 1 .  prochlorperazine (COMPAZINE) 10 MG tablet, Take 1 tablet (10 mg total) by mouth every 6 (six) hours as needed for nausea or vomiting., Disp: 40 tablet, Rfl: 1 .  pseudoephedrine-guaifenesin (MUCINEX D) 60-600 MG 12 hr tablet, Take 1 tablet by mouth every 12 (twelve) hours., Disp: , Rfl:    Allergies:  Penicillins and Sulfa antibiotics  Review of Systems: Gen:  Denies  fever, sweats, chills HEENT: Denies blurred vision, double vision. bleeds, sore throat Cvc:  No dizziness, chest pain. Resp:   +cough + shortness of breath Gi: Denies swallowing difficulty, stomach pain. Gu:  Denies bladder incontinence, burning urine Ext:   No Joint pain, stiffness. Skin: No skin rash,  hives  Endoc:  No polyuria, polydipsia. Psych: No depression, insomnia. Other:  All other systems were reviewed with the patient and were negative other that what is mentioned in the HPI.   Physical Examination:  BP 140/84 (BP Location: Left Arm, Cuff Size: Normal)   Pulse 90   SpO2 99%    VS: BP 140/84 (BP Location: Left Arm, Cuff Size: Normal)   Pulse 90   SpO2 99%   General Appearance: No distress  Neuro:without focal findings,  speech normal,  HEENT: PERRLA, EOM intact.   Pulmonary: normal breath sounds, No wheezing.  CardiovascularNormal S1,S2.  No m/r/g.   Abdomen: Benign, Soft, non-tender. Renal:  No costovertebral tenderness  GU:  No performed at this time. Endoc: No evident thyromegaly, no signs of acromegaly. Skin:   warm, no rashes, no ecchymosis  Extremities: normal, no cyanosis, clubbing.    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  #1 chronic cough and wheezing Most likely related to radiation pneumonitis and COPD  I have discussed prolonged prednisone therapy which is the mainstay treatment for radiation pneumonitis however at this time her cough is really not bothering her to the point of affecting her lifestyle I have advised for her to use her albuterol more often and continue with Advair as prescribed  #2 lung cancer-adenocarcinoma recurrent Abnormal CT chest with right-sided pleural effusion  #3 pleural effusion Plan for ultrasound-guided thoracentesis with cytology  #4 COPD/emphysema. -Continue current inhaled therapy.    Patient/Family are satisfied with Plan of action and management. All questions answered Will follow-up with patient once results return from thoracentesis   Corrin Parker, M.D.  [facility]  Medical Director [place] Director [facility] Cardio-Pulmonary Department

## 2017-09-27 ENCOUNTER — Ambulatory Visit
Admission: RE | Admit: 2017-09-27 | Discharge: 2017-09-27 | Disposition: A | Discharge status: Home / Self Care | Payer: Medicare Other | Source: Ambulatory Visit | Attending: Diagnostic Radiology | Admitting: Diagnostic Radiology

## 2017-09-27 ENCOUNTER — Ambulatory Visit
Admission: RE | Admit: 2017-09-27 | Discharge: 2017-09-27 | Disposition: A | Discharge status: Home / Self Care | Payer: Medicare Other | Source: Ambulatory Visit | Attending: Internal Medicine | Admitting: Internal Medicine

## 2017-09-27 DIAGNOSIS — C7801 Secondary malignant neoplasm of right lung: Secondary | ICD-10-CM | POA: Insufficient documentation

## 2017-09-27 DIAGNOSIS — J90 Pleural effusion, not elsewhere classified: Secondary | ICD-10-CM | POA: Diagnosis present

## 2017-09-27 DIAGNOSIS — Z9889 Other specified postprocedural states: Secondary | ICD-10-CM

## 2017-09-27 LAB — BODY FLUID CELL COUNT WITH DIFFERENTIAL
EOS FL: 1 %
LYMPHS FL: 92 %
Monocyte-Macrophage-Serous Fluid: 7 %
NEUTROPHIL FLUID: 0 %
OTHER CELLS FL: 0 %
Total Nucleated Cell Count, Fluid: 2890 cu mm

## 2017-09-27 LAB — LACTATE DEHYDROGENASE, PLEURAL OR PERITONEAL FLUID: LD, Fluid: 197 U/L — ABNORMAL HIGH (ref 3–23)

## 2017-09-27 LAB — PROTEIN, PLEURAL OR PERITONEAL FLUID: TOTAL PROTEIN, FLUID: 4.3 g/dL

## 2017-09-27 LAB — GLUCOSE, PLEURAL OR PERITONEAL FLUID: Glucose, Fluid: 99 mg/dL

## 2017-09-27 NOTE — Procedures (Signed)
US guided right thoracentesis.  Removed 200 ml of yellow fluid.  Minimal blood loss and no immediate complication.

## 2017-09-28 ENCOUNTER — Encounter: Payer: Self-pay | Admitting: Oncology

## 2017-09-28 ENCOUNTER — Inpatient Hospital Stay (HOSPITAL_BASED_OUTPATIENT_CLINIC_OR_DEPARTMENT_OTHER): Payer: Medicare Other | Admitting: Oncology

## 2017-09-28 ENCOUNTER — Other Ambulatory Visit: Payer: Self-pay

## 2017-09-28 VITALS — BP 141/93 | HR 86 | Temp 96.7°F | Wt 133.4 lb

## 2017-09-28 DIAGNOSIS — C342 Malignant neoplasm of middle lobe, bronchus or lung: Secondary | ICD-10-CM | POA: Diagnosis not present

## 2017-09-28 LAB — ACID FAST SMEAR (AFB)

## 2017-09-28 LAB — ACID FAST SMEAR (AFB, MYCOBACTERIA): Acid Fast Smear: NEGATIVE

## 2017-09-28 LAB — CYTOLOGY - NON PAP

## 2017-09-29 LAB — COMP PANEL: LEUKEMIA/LYMPHOMA

## 2017-10-02 ENCOUNTER — Inpatient Hospital Stay: Payer: Medicare Other | Attending: Oncology

## 2017-10-02 ENCOUNTER — Inpatient Hospital Stay: Payer: Medicare Other

## 2017-10-02 VITALS — BP 127/85 | HR 73 | Temp 97.7°F | Resp 20 | Wt 133.6 lb

## 2017-10-02 DIAGNOSIS — Z79899 Other long term (current) drug therapy: Secondary | ICD-10-CM | POA: Insufficient documentation

## 2017-10-02 DIAGNOSIS — J910 Malignant pleural effusion: Secondary | ICD-10-CM | POA: Insufficient documentation

## 2017-10-02 DIAGNOSIS — Z5112 Encounter for antineoplastic immunotherapy: Secondary | ICD-10-CM | POA: Insufficient documentation

## 2017-10-02 DIAGNOSIS — C3490 Malignant neoplasm of unspecified part of unspecified bronchus or lung: Secondary | ICD-10-CM | POA: Diagnosis present

## 2017-10-02 DIAGNOSIS — C342 Malignant neoplasm of middle lobe, bronchus or lung: Secondary | ICD-10-CM

## 2017-10-02 LAB — CBC WITH DIFFERENTIAL/PLATELET
Basophils Absolute: 0 10*3/uL (ref 0–0.1)
Basophils Relative: 1 %
Eosinophils Absolute: 0.2 10*3/uL (ref 0–0.7)
Eosinophils Relative: 4 %
HEMATOCRIT: 36.3 % (ref 35.0–47.0)
Hemoglobin: 12.4 g/dL (ref 12.0–16.0)
LYMPHS ABS: 0.6 10*3/uL — AB (ref 1.0–3.6)
LYMPHS PCT: 16 %
MCH: 30.3 pg (ref 26.0–34.0)
MCHC: 34.2 g/dL (ref 32.0–36.0)
MCV: 88.4 fL (ref 80.0–100.0)
MONOS PCT: 8 %
Monocytes Absolute: 0.3 10*3/uL (ref 0.2–0.9)
NEUTROS PCT: 71 %
Neutro Abs: 2.7 10*3/uL (ref 1.4–6.5)
Platelets: 259 10*3/uL (ref 150–440)
RBC: 4.1 MIL/uL (ref 3.80–5.20)
RDW: 14 % (ref 11.5–14.5)
WBC: 3.8 10*3/uL (ref 3.6–11.0)

## 2017-10-02 LAB — COMPREHENSIVE METABOLIC PANEL
ALT: 31 U/L (ref 14–54)
AST: 34 U/L (ref 15–41)
Albumin: 3.8 g/dL (ref 3.5–5.0)
Alkaline Phosphatase: 70 U/L (ref 38–126)
Anion gap: 7 (ref 5–15)
BUN: 14 mg/dL (ref 6–20)
CHLORIDE: 103 mmol/L (ref 101–111)
CO2: 25 mmol/L (ref 22–32)
Calcium: 8.8 mg/dL — ABNORMAL LOW (ref 8.9–10.3)
Creatinine, Ser: 0.63 mg/dL (ref 0.44–1.00)
Glucose, Bld: 97 mg/dL (ref 65–99)
POTASSIUM: 3.9 mmol/L (ref 3.5–5.1)
SODIUM: 135 mmol/L (ref 135–145)
Total Bilirubin: 0.7 mg/dL (ref 0.3–1.2)
Total Protein: 7.2 g/dL (ref 6.5–8.1)

## 2017-10-02 MED ORDER — SODIUM CHLORIDE 0.9% FLUSH
10.0000 mL | INTRAVENOUS | Status: DC | PRN
  Administered 2017-10-02: 10 mL
  Filled 2017-10-02: qty 10

## 2017-10-02 MED ORDER — HEPARIN SOD (PORK) LOCK FLUSH 100 UNIT/ML IV SOLN
500.0000 [IU] | Freq: Once | INTRAVENOUS | Status: AC | PRN
  Administered 2017-10-02: 500 [IU]
  Filled 2017-10-02: qty 5

## 2017-10-02 MED ORDER — SODIUM CHLORIDE 0.9 % IV SOLN
620.0000 mg | Freq: Once | INTRAVENOUS | Status: AC
  Administered 2017-10-02: 620 mg via INTRAVENOUS
  Filled 2017-10-02: qty 2.4

## 2017-10-02 MED ORDER — SODIUM CHLORIDE 0.9 % IV SOLN
Freq: Once | INTRAVENOUS | Status: AC
  Administered 2017-10-02: 15:00:00 via INTRAVENOUS
  Filled 2017-10-02: qty 1000

## 2017-10-03 ENCOUNTER — Telehealth: Payer: Self-pay | Admitting: Internal Medicine

## 2017-10-03 LAB — THYROID PANEL WITH TSH
FREE THYROXINE INDEX: 1.2 (ref 1.2–4.9)
T3 UPTAKE RATIO: 19 % — AB (ref 24–39)
T4, Total: 6.3 ug/dL (ref 4.5–12.0)
TSH: 2.7 u[IU]/mL (ref 0.450–4.500)

## 2017-10-03 NOTE — Telephone Encounter (Signed)
Please advise on message below.

## 2017-10-03 NOTE — Telephone Encounter (Signed)
Patient would like to know the status of pathology results Please call when ready

## 2017-10-05 ENCOUNTER — Encounter: Payer: Self-pay | Admitting: Oncology

## 2017-10-05 NOTE — Telephone Encounter (Signed)
Noted  

## 2017-10-05 NOTE — Telephone Encounter (Signed)
I called patient and updated her with her results Patient is to follow up with Dr Marcos Eke

## 2017-10-08 NOTE — Progress Notes (Signed)
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  INTERVAL HISTORY: Patient returns to clinic today as an add-on for discussion of her pathology results and treatment planning.  She is anxious, but otherwise feels well.  She continues to have shortness of breath and cough, but was able to walk over 3 miles yesterday. She has no neurologic complaints.  She denies any recent fevers or illnesses.  She has a good appetite and denies weight loss.  She has no chest pain or hemoptysis.  She denies any nausea, vomiting, constipation, or diarrhea.  She has no urinary complaints.  Patient offers no further specific complaints today.  REVIEW OF SYSTEMS:   Review of Systems  Constitutional: Negative.  Negative for fever, malaise/fatigue and weight loss.  Respiratory: Positive for cough and shortness of breath. Negative for hemoptysis.   Cardiovascular: Negative.  Negative for chest pain and leg swelling.  Gastrointestinal: Negative.  Negative for abdominal pain and constipation.  Genitourinary: Negative.  Negative for dysuria.  Musculoskeletal: Negative.  Negative for back pain.  Skin: Negative.  Negative for rash.  Neurological: Negative.  Negative for sensory change, focal weakness and weakness.  Psychiatric/Behavioral: The patient is nervous/anxious.     As per HPI. Otherwise, a complete review of systems is negative.  PAST MEDICAL HISTORY: Past Medical History:  Diagnosis Date  . Acid reflux   . Anemia    HAD TO HAVE IRON INFUSIONS BACK IN 2015  . Arthritis    bil hands and back  . Asthma   . Cancer of right lung ([place]) 2015   lung cancer - chemo, Dr Genevive Bi removed RML Lobectomy.   Marland Kitchen Dyspnea    WITH EXERTION  . Hypertension   . Mitral valve prolapse     . Primary cancer of right middle lobe of lung ([place]) 03/11/2016    PAST SURGICAL HISTORY: Past Surgical History:  Procedure Laterality Date  . ABDOMINAL HYSTERECTOMY    . BREAST BIOPSY Left 1994 (-), 2004 (-)  . COLONOSCOPY  2008    Dr. Donnella Sham  . KNEE SURGERY  2007  . LUNG LOBECTOMY Right    middle lobe  . PORTACATH PLACEMENT Right 02/09/2017   Procedure: INSERTION PORT-A-CATH;  Surgeon: Nestor Lewandowsky, MD;  Location: [facility] ORS;  Service: General;  Laterality: Right;  . SALIVARY GLAND SURGERY Left 1983   with lymph node removal also  . UPPER GI ENDOSCOPY  2008  . VIDEO BRONCHOSCOPY WITH ENDOBRONCHIAL ULTRASOUND Right 01/24/2017   Procedure: VIDEO BRONCHOSCOPY WITH ENDOBRONCHIAL ULTRASOUND;  Surgeon: [name], MD;  Location: [facility] ORS;  Service: Pulmonary;  Laterality: Right;    FAMILY HISTORY: Family History  Problem Relation Age of Onset  . Breast cancer Maternal Aunt   . Breast cancer Paternal Aunt   . Breast cancer Maternal Grandmother   . Breast cancer Maternal Aunt   . Breast cancer Paternal Aunt   . Healthy Mother   . Healthy Father     ADVANCED DIRECTIVES (Y/N):  N  HEALTH MAINTENANCE: Social History   Tobacco Use  . Smoking status: Never Smoker  . Smokeless tobacco: Never Used  Substance Use Topics  . Alcohol use: Yes    Alcohol/week: 4.2 oz    Types: 7 Glasses of wine per week  . Drug use: No  Colonoscopy:  PAP:  Bone density:  Lipid panel:  Allergies  Allergen Reactions  . Penicillins Swelling and Rash    Has patient had a PCN reaction causing immediate rash, facial/tongue/throat swelling, SOB or lightheadedness with hypotension: Unknown Has patient had a PCN reaction causing severe rash involving mucus membranes or skin necrosis: Unknown Has patient had a PCN reaction that required hospitalization: No Has patient had a PCN reaction occurring within the last 10 years: No If all of the above answers are "NO", then may proceed with  Cephalosporin use.    . Sulfa Antibiotics Nausea And Vomiting    Current Outpatient Medications  Medication Sig Dispense Refill  . B Complex Vitamins (VITAMIN B COMPLEX PO) Take 1 tablet by mouth daily.     . Biotin w/ Vitamins C & E (HAIR/SKIN/NAILS PO) Take 1 tablet by mouth daily.    . Calcium Carb-Cholecalciferol (CALCIUM 600+D) 600-800 MG-UNIT TABS Take 2 tablets by mouth daily.    . Cholecalciferol (D3 MAXIMUM STRENGTH) 5000 units capsule Take 5,000 Units by mouth daily.    . COLLAGEN PO Take 1,000 mg by mouth daily.    . fexofenadine (ALLEGRA) 180 MG tablet Take 180 mg by mouth daily as needed for allergies.     . fluticasone (FLONASE) 50 MCG/ACT nasal spray Place 2 sprays into both nostrils daily as needed for allergies or rhinitis.    . Fluticasone-Salmeterol (ADVAIR) 250-50 MCG/DOSE AEPB Inhale 1 puff into the lungs 2 (two) times daily as needed (for respiratory issues.).    Marland Kitchen lansoprazole (PREVACID) 30 MG capsule Take 30 mg by mouth 2 (two) times daily before a meal.     . losartan (COZAAR) 25 MG tablet Take 25 mg by mouth at bedtime.     Marland Kitchen PREMARIN 1.25 MG tablet   1  . albuterol (PROVENTIL HFA;VENTOLIN HFA) 108 (90 Base) MCG/ACT inhaler Inhale 1-2 puffs into the lungs every 6 (six) hours as needed for wheezing or shortness of breath.    . cyclobenzaprine (FLEXERIL) 5 MG tablet Take 1 tablet (5 mg total) by mouth 3 (three) times daily as needed for muscle spasms. (Patient not taking: Reported on 10/11/2017) 30 tablet 1  . lidocaine-prilocaine (EMLA) cream Apply 1 application topically as needed. Apply generously over the Mediport 45 minutes prior to chemotherapy. (Patient not taking: Reported on 10/11/2017) 30 g 0  . metoCLOPramide (REGLAN) 10 MG tablet Take 1 tablet (10 mg total) by mouth every 8 (eight) hours as needed for nausea. (Patient not taking: Reported on 10/11/2017) 40 tablet 3  . ondansetron (ZOFRAN) 8 MG tablet Take 1 tablet (8 mg total) by mouth every 8 (eight) hours as  needed for nausea or vomiting (start 3 days; after chemo). (Patient not taking: Reported on 10/11/2017) 40 tablet 1  . osimertinib mesylate (TAGRISSO) 80 MG tablet Take 1 tablet (80 mg total) by mouth daily. 30 tablet 5  . polyethylene glycol (MIRALAX / GLYCOLAX) packet Take 17 g by mouth daily as needed for mild constipation.    . prochlorperazine (COMPAZINE) 10 MG tablet Take 1 tablet (10 mg total) by mouth every 6 (six) hours as needed for nausea or vomiting. (Patient not taking: Reported on 10/11/2017) 40 tablet 1  . prochlorperazine (COMPAZINE) 10 MG tablet Take by mouth.    . pseudoephedrine-guaifenesin (MUCINEX D) 60-600 MG 12 hr tablet Take 1 tablet by mouth every 12 (twelve) hours.     No current facility-administered medications for this visit.     OBJECTIVE: Vitals:  10/11/17 1153  BP: 122/79  Pulse: 75  Resp: 18  Temp: (!) 97.1 F (36.2 C)     Body mass index is 22.76 kg/m.    ECOG FS:0 - Asymptomatic  General: Well-developed, well-nourished, no acute distress. Eyes: Pink conjunctiva, anicteric sclera. HEENT: Normocephalic, moist mucous membranes, clear oropharnyx. Lungs: Clear to auscultation bilaterally. Heart: Regular rate and rhythm. No rubs, murmurs, or gallops. Abdomen: Soft, nontender, nondistended. No organomegaly noted, normoactive bowel sounds. Musculoskeletal: No edema, cyanosis, or clubbing. Neuro: Alert, answering all questions appropriately. Cranial nerves grossly intact. Skin: No rashes or petechiae noted. Psych: Normal affect.  LAB RESULTS:  Lab Results  Component Value Date   NA 135 10/02/2017   K 3.9 10/02/2017   CL 103 10/02/2017   CO2 25 10/02/2017   GLUCOSE 97 10/02/2017   BUN 14 10/02/2017   CREATININE 0.63 10/02/2017   CALCIUM 8.8 (L) 10/02/2017   PROT 7.2 10/02/2017   ALBUMIN 3.8 10/02/2017   AST 34 10/02/2017   ALT 31 10/02/2017   ALKPHOS 70 10/02/2017   BILITOT 0.7 10/02/2017   GFRNONAA >60 10/02/2017   GFRAA >60 10/02/2017     Lab Results  Component Value Date   WBC 3.8 10/02/2017   NEUTROABS 2.7 10/02/2017   HGB 12.4 10/02/2017   HCT 36.3 10/02/2017   MCV 88.4 10/02/2017   PLT 259 10/02/2017     STUDIES: Dg Chest 1 View  Result Date: 09/28/2017 CLINICAL DATA:  67 year old female status post ultrasound-guided right side thoracentesis. Lung cancer. EXAM: CHEST  1 VIEW COMPARISON:  Ultrasound thoracentesis images from 1332 hours the same day. Chest CT 09/20/2017. FINDINGS: No pneumothorax. Small or trace residual right pleural effusion. Stable architectural distortion about the right hilum. Stable right chest porta cath. The left lung appears clear. No new pulmonary opacity. No acute osseous abnormality identified. Negative visible bowel gas pattern. IMPRESSION: 1. No pneumothorax or adverse features status post ultrasound-guided right thoracentesis. 2. No new cardiopulmonary abnormality. Electronically Signed   By: Genevie Ann M.D.   On: 09/28/2017 00:30   Ct Chest W Contrast  Result Date: 09/20/2017 CLINICAL DATA:  Patient with history of lung cancer. Follow-up evaluation. EXAM: CT CHEST WITH CONTRAST TECHNIQUE: Multidetector CT imaging of the chest was performed during intravenous contrast administration. CONTRAST:  71m ISOVUE-300 IOPAMIDOL (ISOVUE-300) INJECTION 61% COMPARISON:  Chest CT 06/28/2017. FINDINGS: Cardiovascular: Right anterior chest wall Port-A-Cath is present with tip terminating in the superior vena cava. Normal heart size. Trace fluid superior pericardial recess. Ascending thoracic aorta measures 3.7 cm (image 64; series 2). Thoracic aortic vascular calcifications. Mediastinum/Nodes: No enlarged axillary, mediastinal or hilar lymphadenopathy. Esophagus is normal in appearance. Stable 7 mm precarinal lymph node (image 46; series 2). Similar 1.0 cm right hilar lymph node (image 51; series 2). Lungs/Pleura: Central airways are patent. Stable 7 mm ground-glass nodule anterior right lower lobe (image 73;  series 3). Dependent atelectasis bilateral lower lobes. Biapical pleuroparenchymal thickening/scarring. Interval increase in bandlike right paratracheal reticulation and consolidation with associated bronchiectasis most compatible with postradiation changes. Interval increase in size of moderate layering right pleural effusion. Grossly unchanged subtle nodularity along the right fissure. Upper Abdomen: Stable subcentimeter low-attenuation lesion right hepatic lobe (image 123; series 2), too small to characterize. No acute process. Musculoskeletal: Re demonstrated patchy sclerosis within the anterior right sixth rib (image 114; series 3). Thoracic spine degenerative changes. IMPRESSION: 1. Interval increase in sharply marginated consolidation within the medial right lung with associated bronchiectasis most compatible with post radiation changes.  Underlying recurrent disease is not excluded. 2. Interval increase in size of moderate layering right pleural effusion. 3. Stable small nodules within the right lung. 4. Stable sclerotic lesion within the anterior right sixth rib. 5. Aortic Atherosclerosis (ICD10-I70.0) and Emphysema (ICD10-J43.9). Electronically Signed   By: Lovey Newcomer M.D.   On: 09/20/2017 17:00   US Thoracentesis Asp Pleural Space W/img Guide  Result Date: 09/27/2017 INDICATION: 67 year old with history of lung cancer and right pleural effusion. Request for diagnostic thoracentesis. EXAM: ULTRASOUND GUIDED RIGHT THORACENTESIS MEDICATIONS: None. COMPLICATIONS: None immediate. PROCEDURE: An ultrasound guided thoracentesis was thoroughly discussed with the patient and questions answered. The benefits, risks, alternatives and complications were also discussed. The patient understands and wishes to proceed with the procedure. Written consent was obtained. Ultrasound was performed to localize and mark an adequate pocket of fluid in the right chest. The area was then prepped and draped in the normal sterile  fashion. 1% Lidocaine was used for local anesthesia. Under ultrasound guidance a 19 gauge, 7-cm, Yueh catheter was introduced. Thoracentesis was performed. The catheter was removed and a dressing applied. FINDINGS: A total of approximately 200 mL of yellow fluid was removed. Samples were sent to the laboratory as requested by the clinical team. IMPRESSION: Successful ultrasound guided right thoracentesis yielding 200 mL of pleural fluid. Electronically Signed   By: Markus Daft M.D.   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  She initially received 4 cycles of adjuvant cisplatin and Alimta completing on January 23, 2014.  Patient was on the [facility] protocol for maintenance Tarceva versus placebo, but discontinued treatment in November 2015 because of significant side effects.  Although blinded, presumption was she was receiving Tarceva.  She recently was noted to have a recurrence confirmed by right paratracheal lymph node biopsy on January 24, 2017.  PET scan on February 03, 2017 did not reveal any distant metastasis.  She underwent treatment with weekly carboplatinum and Taxol along with daily XRT finishing in October 2018.  She initiated maintenance durvalumab on June 29, 2017.  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  Patient started on Tagrisso in April 2019.   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  PLAN:   1. 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  Pleural fluid from thoracentesis with malignant cells consistent with her known lung cancer.  Will consider this progressive disease and discontinue immunotherapy at this time.  Will get a PET scan to further evaluate.  Patient was given a prescription for 80 mg Tagrisso daily which she intends to initiate after  returning from a trip in late April.  Return to clinic in 1 month with repeat laboratory work and further evaluation.    2.  Cough: Chronic, mildly improved with thoracentesis.  Monitor.    Approximately 30 minutes was spent in discussion of which greater than 50% was consultation.  Patient expressed understanding and was in agreement with this plan. She also understands that She can call clinic at any time with any questions, concerns, or complaints.   Cancer Staging No matching staging information was found for the patient.  Lloyd Huger, MD   10/13/2017 2:21 PM

## 2017-10-11 ENCOUNTER — Inpatient Hospital Stay (HOSPITAL_BASED_OUTPATIENT_CLINIC_OR_DEPARTMENT_OTHER): Payer: Medicare Other | Admitting: Oncology

## 2017-10-11 ENCOUNTER — Other Ambulatory Visit: Payer: Self-pay

## 2017-10-11 ENCOUNTER — Telehealth: Payer: Self-pay | Admitting: Pharmacist

## 2017-10-11 VITALS — BP 122/79 | HR 75 | Temp 97.1°F | Resp 18 | Wt 132.6 lb

## 2017-10-11 DIAGNOSIS — C3490 Malignant neoplasm of unspecified part of unspecified bronchus or lung: Secondary | ICD-10-CM

## 2017-10-11 DIAGNOSIS — Z5112 Encounter for antineoplastic immunotherapy: Secondary | ICD-10-CM | POA: Diagnosis not present

## 2017-10-11 DIAGNOSIS — J910 Malignant pleural effusion: Secondary | ICD-10-CM | POA: Diagnosis not present

## 2017-10-11 DIAGNOSIS — C342 Malignant neoplasm of middle lobe, bronchus or lung: Secondary | ICD-10-CM

## 2017-10-11 MED ORDER — OSIMERTINIB MESYLATE 80 MG PO TABS: 80.0000 mg | ORAL_TABLET | Freq: Every day | ORAL | 5 refills | Status: DC

## 2017-10-11 NOTE — Telephone Encounter (Signed)
Oral Oncology Pharmacist Encounter  Received new prescription for Tagrisso (osimertinib) for the treatment of metastatic NSCLC EGFR mutation positive, planned duration until disease progression or unacceptable drug toxicity.  Prescription dose and frequency assessed.   Current medication list in Epic reviewed, no relevant DDIs with Tagrisso (osimertinib) identified.  Prescription has been e-scribed to the [facility] for benefits analysis and approval.  Patient education Counseled patient and her husband on administration, dosing, side effects, monitoring, drug-food interactions, safe handling, storage, and disposal. Patient will take 1 tablet (80 mg total) by mouth daily.  Side effects include but not limited to: Decrease WBC/plt/hgb, diarrhea, rash.    Reviewed with patient importance of keeping a medication schedule and plan for any missed doses.  Ms. Limbert voiced understanding and appreciation. All questions answered. Medication hand out provided to patient.   Oral Oncology Clinic will continue to follow for insurance authorization, copayment issues, and start date.  Provided patient with [facility] [facility] phone number. Patient knows to call the office with questions or concerns. Oral Chemotherapy Navigation Clinic will continue to follow.  Darl Pikes, PharmD, BCPS Hematology/Oncology Clinical Pharmacist [facility] [place] [facility] [phone]  10/11/2017 3:48 PM

## 2017-10-11 NOTE — Progress Notes (Signed)
Here for follow up. Stated feeling more sob than in past.walked 3.5 mi yesterday and stated sob.pulse ox 96 % on RA-sitting. Pt stated no sob today.

## 2017-10-12 ENCOUNTER — Telehealth: Payer: Self-pay | Admitting: Pharmacy Technician

## 2017-10-12 NOTE — Telephone Encounter (Signed)
Oral Oncology Patient Advocate Encounter  Received notification from [employer] that prior authorization for Tagrisso is required.  PA submitted on CoverMyMeds Key [id number] Status is pending  Oral Oncology Clinic will continue to follow.  Fabio Asa. Melynda Keller, [place] Patient [place] [phone] 10/12/2017 2:02 PM

## 2017-10-16 ENCOUNTER — Other Ambulatory Visit: Payer: Medicare Other

## 2017-10-16 ENCOUNTER — Encounter: Payer: Self-pay | Admitting: Cardiothoracic Surgery

## 2017-10-16 ENCOUNTER — Ambulatory Visit: Payer: Medicare Other

## 2017-10-16 ENCOUNTER — Ambulatory Visit: Payer: Medicare Other | Admitting: Oncology

## 2017-10-17 ENCOUNTER — Encounter
Admission: RE | Admit: 2017-10-17 | Discharge: 2017-10-17 | Disposition: A | Discharge status: Home / Self Care | Payer: Medicare Other | Source: Ambulatory Visit | Attending: Oncology | Admitting: Oncology

## 2017-10-17 DIAGNOSIS — C342 Malignant neoplasm of middle lobe, bronchus or lung: Secondary | ICD-10-CM | POA: Insufficient documentation

## 2017-10-17 LAB — GLUCOSE, CAPILLARY: GLUCOSE-CAPILLARY: 76 mg/dL (ref 65–99)

## 2017-10-17 MED ORDER — FLUDEOXYGLUCOSE F - 18 (FDG) INJECTION
6.9900 | Freq: Once | INTRAVENOUS | Status: AC
  Administered 2017-10-17: 6.99 via INTRAVENOUS

## 2017-10-17 NOTE — Telephone Encounter (Signed)
Oral Chemotherapy Pharmacist Encounter   Tagrisso 80mg  PA approved Approval dates: 10/16/17-10/16/18  I will place a copy of the award letter to be scanned into patient's chart.  Darl Pikes, PharmD, BCPS Hematology/Oncology Clinical Pharmacist [facility] [place] [facility] [phone]  10/17/2017 9:14 AM

## 2017-10-18 ENCOUNTER — Encounter: Payer: Self-pay | Admitting: Oncology

## 2017-10-21 DIAGNOSIS — C3490 Malignant neoplasm of unspecified part of unspecified bronchus or lung: Secondary | ICD-10-CM | POA: Insufficient documentation

## 2017-10-21 DIAGNOSIS — C782 Secondary malignant neoplasm of pleura: Secondary | ICD-10-CM | POA: Insufficient documentation

## 2017-10-23 ENCOUNTER — Telehealth: Payer: Self-pay | Admitting: Pharmacist

## 2017-10-23 NOTE — Telephone Encounter (Signed)
Oral Chemotherapy Pharmacist Encounter   Met with patient in lobby on 4/17 to have her sign [employer] paperwork for [employer] manufacturer assistance. [name] also brought in her 2018 tax documents and Part B/D out of pocket expenses mailings from her insurance. Copies of necessary documents were made and originals were returned to the patient.   Application for assitance was faxed to [employer] on 4/17.   Darl Pikes, PharmD, BCPS Hematology/Oncology Clinical Pharmacist [facility] [place] [facility] [phone]  10/23/2017 10:46 AM

## 2017-10-23 NOTE — Telephone Encounter (Signed)
Oral Chemotherapy Pharmacist Encounter   Patient was approved for manufacturer assistance from [place] for [place]. They will reach out to the patient within 3-5 days for medication shipment.  Patient was approved until 07/03/18  I will place a copy of the approval letter to be scanned into patient's chart.  Darl Pikes, PharmD, BCPS Hematology/Oncology Clinical Pharmacist [facility] [place] [facility] [phone]  10/23/2017 10:54 AM

## 2017-10-31 LAB — FUNGUS CULTURE WITH STAIN

## 2017-10-31 LAB — FUNGUS CULTURE RESULT

## 2017-10-31 LAB — FUNGAL ORGANISM REFLEX

## 2017-11-06 ENCOUNTER — Ambulatory Visit (INDEPENDENT_AMBULATORY_CARE_PROVIDER_SITE_OTHER): Payer: Medicare Other | Admitting: Internal Medicine

## 2017-11-06 ENCOUNTER — Other Ambulatory Visit: Payer: Self-pay | Admitting: Internal Medicine

## 2017-11-06 ENCOUNTER — Encounter: Payer: Self-pay | Admitting: Internal Medicine

## 2017-11-06 VITALS — BP 130/84 | HR 56 | Ht 64.0 in | Wt 134.0 lb

## 2017-11-06 DIAGNOSIS — J700 Acute pulmonary manifestations due to radiation: Secondary | ICD-10-CM | POA: Diagnosis not present

## 2017-11-06 DIAGNOSIS — J90 Pleural effusion, not elsewhere classified: Secondary | ICD-10-CM

## 2017-11-06 DIAGNOSIS — Z1231 Encounter for screening mammogram for malignant neoplasm of breast: Secondary | ICD-10-CM

## 2017-11-06 NOTE — Progress Notes (Signed)
[facility] Consultation    Update: Pt seen and evaluated before pt going for procedure, new changes in exam have been noted.     Date: 11/06/2017  MRN# [id number] Lauren Page 09-20-1950    Lauren Page is a 66 y.o. old female seen in consultation for chief complaint of:    CC follow up Abnormal CT chest  PREVIOUS HPI 67 year old woman who is status post right middle lobectomy several years ago. She is now about 2-1/2 years out from a right thoracotomy and right middle lobectomy for a adenocarcinoma the lung with pleural invasion, she then completed 4 cycles of chemotherapy on January 23, 2014   About a year ago she did have a small nodule identified along the superior segment right lower lobe close to the area of the fissure, she was found to have a nodular density in the superior segment of the right lower lobe. A subsequent PET scan was performed which revealed no evidence of uptake in the hypermetabolic range.   A repeat CT scan July 2018 There are some bilateral hilar adenopathy which is increased slightly in size.  The largest lymph node now measures over 1.5 cm in the right hilum. The left hilum has a 9 mm lymph node. These have increased from before.   EBUS shows recurrence Of Adenocarcioma-underwent Chemo/RXT  CT scans subsequently showed RT lung pneumonitis from RXT  3.20.19 scans show lung damage and effusion RT sided  Patient also has increased WOB and SOB and cough CT shows pleural effusion as well US guided thoracentesis performed-+malgnant cells with adenocarcinoma  HPI COPD stable Patient has been on Advair which has seems to have helped Patient also had been given antibiotics and prednisone several times and it seemed to have helped her breathing  I did however explain to patient that CT scan does show extensive radiation pneumonitis and prolonged prednisone therapy as an option at this time, no prednisone at this time  No signs of infection  at this time no signs of heart failure at this time Patient exercise daily  Medication:    Current Outpatient Medications:  .  albuterol (PROVENTIL HFA;VENTOLIN HFA) 108 (90 Base) MCG/ACT inhaler, Inhale 1-2 puffs into the lungs every 6 (six) hours as needed for wheezing or shortness of breath., Disp: , Rfl:  .  B Complex Vitamins (VITAMIN B COMPLEX PO), Take 1 tablet by mouth daily. , Disp: , Rfl:  .  Biotin w/ Vitamins C & E (HAIR/SKIN/NAILS PO), Take 1 tablet by mouth daily., Disp: , Rfl:  .  Calcium Carb-Cholecalciferol (CALCIUM 600+D) 600-800 MG-UNIT TABS, Take 2 tablets by mouth daily., Disp: , Rfl:  .  Cholecalciferol (D3 MAXIMUM STRENGTH) 5000 units capsule, Take 5,000 Units by mouth daily., Disp: , Rfl:  .  COLLAGEN PO, Take 1,000 mg by mouth daily., Disp: , Rfl:  .  cyclobenzaprine (FLEXERIL) 5 MG tablet, Take 1 tablet (5 mg total) by mouth 3 (three) times daily as needed for muscle spasms., Disp: 30 tablet, Rfl: 1 .  fexofenadine (ALLEGRA) 180 MG tablet, Take 180 mg by mouth daily as needed for allergies. , Disp: , Rfl:  .  fluticasone (FLONASE) 50 MCG/ACT nasal spray, Place 2 sprays into both nostrils daily as needed for allergies or rhinitis., Disp: , Rfl:  .  Fluticasone-Salmeterol (ADVAIR) 250-50 MCG/DOSE AEPB, Inhale 1 puff into the lungs 2 (two) times daily as needed (for respiratory issues.)., Disp: , Rfl:  .  lansoprazole (PREVACID) 30 MG capsule,  Take 30 mg by mouth 2 (two) times daily before a meal. , Disp: , Rfl:  .  lidocaine-prilocaine (EMLA) cream, Apply 1 application topically as needed. Apply generously over the Mediport 45 minutes prior to chemotherapy., Disp: 30 g, Rfl: 0 .  losartan (COZAAR) 25 MG tablet, Take 25 mg by mouth at bedtime. , Disp: , Rfl:  .  metoCLOPramide (REGLAN) 10 MG tablet, Take 1 tablet (10 mg total) by mouth every 8 (eight) hours as needed for nausea., Disp: 40 tablet, Rfl: 3 .  ondansetron (ZOFRAN) 8 MG tablet, Take 1 tablet (8 mg total) by  mouth every 8 (eight) hours as needed for nausea or vomiting (start 3 days; after chemo)., Disp: 40 tablet, Rfl: 1 .  osimertinib mesylate (TAGRISSO) 80 MG tablet, Take 1 tablet (80 mg total) by mouth daily., Disp: 30 tablet, Rfl: 5 .  polyethylene glycol (MIRALAX / GLYCOLAX) packet, Take 17 g by mouth daily as needed for mild constipation., Disp: , Rfl:  .  PREMARIN 1.25 MG tablet, , Disp: , Rfl: 1 .  prochlorperazine (COMPAZINE) 10 MG tablet, Take 1 tablet (10 mg total) by mouth every 6 (six) hours as needed for nausea or vomiting., Disp: 40 tablet, Rfl: 1 .  pseudoephedrine-guaifenesin (MUCINEX D) 60-600 MG 12 hr tablet, Take 1 tablet by mouth every 12 (twelve) hours., Disp: , Rfl:    Allergies:  Penicillins and Sulfa antibiotics  Review of Systems: Gen:  Denies  fever, sweats, chills HEENT: Denies blurred vision, double vision. bleeds, sore throat Cvc:  No dizziness, chest pain. Resp:   +cough + shortness of breath Gi: Denies swallowing difficulty, stomach pain. Gu:  Denies bladder incontinence, burning urine Ext:   No Joint pain, stiffness. Skin: No skin rash,  hives  Endoc:  No polyuria, polydipsia. Psych: No depression, insomnia. Other:  All other systems were reviewed with the patient and were negative other that what is mentioned in the HPI.   Physical Examination:  BP 130/84 (BP Location: Left Arm, Cuff Size: Normal)   Pulse (!) 56   Ht 5\' 4"  (1.626 m)   Wt 134 lb (60.8 kg)   SpO2 99%   BMI 23.00 kg/m    VS: BP 130/84 (BP Location: Left Arm, Cuff Size: Normal)   Pulse (!) 56   Ht 5\' 4"  (1.626 m)   Wt 134 lb (60.8 kg)   SpO2 99%   BMI 23.00 kg/m   General Appearance: No distress  Neuro:without focal findings,  speech normal,  HEENT: PERRLA, EOM intact.   Pulmonary: normal breath sounds, No wheezing.  CardiovascularNormal S1,S2.  No m/r/g.   Abdomen: Benign, Soft, non-tender. Renal:  No costovertebral tenderness  GU:  No performed at this time. Endoc: No  evident thyromegaly, no signs of acromegaly. Skin:   warm, no rashes, no ecchymosis  Extremities: normal, no cyanosis, clubbing.    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 status post chemo and radiation now with right upper lobe right middle lobe right lower lobe radiation pneumonitis complicated by right-sided pleural effusion c/w adnenocarcinoma PET scans reviewed with patient  #1 chronic cough and wheezing Most likely related to radiation pneumonitis and COPD  I have discussed prolonged prednisone therapy which is the mainstay treatment for radiation pneumonitis however at this time her cough is really not bothering her to the  point of affecting her lifestyle I have advised for her to use her albuterol more often and continue with Advair as prescribed  #2 lung cancer-adenocarcinoma recurrent Follow up oncology  #3 pleural effusion -no signs of effusion on exam at this time Good air entry on both lungs at bases  #4 COPD/emphysema. -Continue current inhaled therapy.    Patient/Family are satisfied with Plan of action and management. All questions answered Follow up in 6 months  Ryin Schillo Patricia Pesa, M.D.  [facility]  Medical Director [facility]io-P[facility] Department

## 2017-11-06 NOTE — Progress Notes (Signed)
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  INTERVAL HISTORY: Patient returns to clinic today for further evaluation and to assess her toleration of Tagrisso.  She has some mild diarrhea, but otherwise is tolerating her treatment well without significant side effects.  She continues to have right flank pain that is chronic in nature.  She also continues to be highly anxious. She has no neurologic complaints.  She denies any recent fevers or illnesses.  She has a good appetite and denies weight loss.  She has no chest pain or hemoptysis.  She denies any nausea, vomiting, constipation, or diarrhea.  She has no urinary complaints.  Patient offers no further specific complaints.  REVIEW OF SYSTEMS:   Review of Systems  Constitutional: Negative.  Negative for fever, malaise/fatigue and weight loss.  Respiratory: Positive for cough. Negative for hemoptysis and shortness of breath.   Cardiovascular: Negative.  Negative for chest pain and leg swelling.  Gastrointestinal: Positive for diarrhea. Negative for abdominal pain and constipation.  Genitourinary: Positive for flank pain. Negative for dysuria.  Musculoskeletal: Negative for back pain.  Skin: Negative.  Negative for rash.  Neurological: Negative.  Negative for sensory change, focal weakness and weakness.  Psychiatric/Behavioral: The patient is nervous/anxious.     As per HPI. Otherwise, a complete review of systems is negative.  PAST MEDICAL HISTORY: Past Medical History:  Diagnosis Date  . Acid reflux   . Anemia    HAD TO HAVE IRON INFUSIONS BACK IN 2015  . Arthritis    bil hands and back  . Asthma   . Cancer of right lung ([place]) 2015   lung cancer - chemo, Dr Genevive Bi removed RML Lobectomy.     Marland Kitchen Dyspnea    WITH EXERTION  . Hypertension   . Mitral valve prolapse   . Primary cancer of right middle lobe of lung ([place]) 03/11/2016    PAST SURGICAL HISTORY: Past Surgical History:  Procedure Laterality Date  . ABDOMINAL HYSTERECTOMY    . BREAST BIOPSY Left 1994 (-), 2004 (-)  . COLONOSCOPY  2008    Dr. Donnella Sham  . KNEE SURGERY  2007  . LUNG LOBECTOMY Right    middle lobe  . PORTACATH PLACEMENT Right 02/09/2017   Procedure: INSERTION PORT-A-CATH;  Surgeon: Nestor Lewandowsky, MD;  Location: [facility] ORS;  Service: General;  Laterality: Right;  . SALIVARY GLAND SURGERY Left 1983   with lymph node removal also  . UPPER GI ENDOSCOPY  2008  . VIDEO BRONCHOSCOPY WITH ENDOBRONCHIAL ULTRASOUND Right 01/24/2017   Procedure: VIDEO BRONCHOSCOPY WITH ENDOBRONCHIAL ULTRASOUND;  Surgeon: [name], MD;  Location: [facility] ORS;  Service: Pulmonary;  Laterality: Right;    FAMILY HISTORY: Family History  Problem Relation Age of Onset  . Breast cancer Maternal Aunt   . Breast cancer Paternal Aunt   . Breast cancer Maternal Grandmother   . Breast cancer Maternal Aunt   . Breast cancer Paternal Aunt   . Healthy Mother   . Healthy Father     ADVANCED DIRECTIVES (Y/N):  N  HEALTH MAINTENANCE: Social History   Tobacco Use  . Smoking status: Never Smoker  . Smokeless tobacco: Never Used  Substance Use Topics  . Alcohol use: Yes    Alcohol/week: 4.2 oz    Types: 7 Glasses  of wine per week  . Drug use: No     Colonoscopy:  PAP:  Bone density:  Lipid panel:  Allergies  Allergen Reactions  . Penicillins Swelling and Rash    Has patient had a PCN reaction causing immediate rash, facial/tongue/throat swelling, SOB or lightheadedness with hypotension: Unknown Has patient had a PCN reaction causing severe rash involving mucus membranes or skin necrosis: Unknown Has patient had a PCN reaction that required hospitalization: No Has patient had a PCN reaction occurring within the last 10  years: No If all of the above answers are "NO", then may proceed with Cephalosporin use.    . Sulfa Antibiotics Nausea And Vomiting    Current Outpatient Medications  Medication Sig Dispense Refill  . B Complex Vitamins (VITAMIN B COMPLEX PO) Take 1 tablet by mouth daily.     . Biotin w/ Vitamins C & E (HAIR/SKIN/NAILS PO) Take 1 tablet by mouth daily.    . Calcium Carb-Cholecalciferol (CALCIUM 600+D) 600-800 MG-UNIT TABS Take 2 tablets by mouth daily.    . Cholecalciferol (D3 MAXIMUM STRENGTH) 5000 units capsule Take 5,000 Units by mouth daily.    . COLLAGEN PO Take 1,000 mg by mouth daily.    . lansoprazole (PREVACID) 30 MG capsule Take 30 mg by mouth 2 (two) times daily before a meal.     . losartan (COZAAR) 25 MG tablet Take 25 mg by mouth at bedtime.     . metoCLOPramide (REGLAN) 10 MG tablet Take 1 tablet (10 mg total) by mouth every 8 (eight) hours as needed for nausea. 40 tablet 3  . ondansetron (ZOFRAN) 8 MG tablet Take 1 tablet (8 mg total) by mouth every 8 (eight) hours as needed for nausea or vomiting (start 3 days; after chemo). 40 tablet 1  . osimertinib mesylate (TAGRISSO) 80 MG tablet Take 1 tablet (80 mg total) by mouth daily. 30 tablet 5  . PREMARIN 1.25 MG tablet   1  . prochlorperazine (COMPAZINE) 10 MG tablet Take 1 tablet (10 mg total) by mouth every 6 (six) hours as needed for nausea or vomiting. 40 tablet 1  . albuterol (PROVENTIL HFA;VENTOLIN HFA) 108 (90 Base) MCG/ACT inhaler Inhale 1-2 puffs into the lungs every 6 (six) hours as needed for wheezing or shortness of breath.    . cyclobenzaprine (FLEXERIL) 5 MG tablet Take 1 tablet (5 mg total) by mouth 3 (three) times daily as needed for muscle spasms. (Patient not taking: Reported on 11/08/2017) 30 tablet 1  . fexofenadine (ALLEGRA) 180 MG tablet Take 180 mg by mouth daily as needed for allergies.     . fluconazole (DIFLUCAN) 100 MG tablet   0  . fluticasone (FLONASE) 50 MCG/ACT nasal spray Place 2 sprays into both  nostrils daily as needed for allergies or rhinitis.    . Fluticasone-Salmeterol (ADVAIR) 250-50 MCG/DOSE AEPB Inhale 1 puff into the lungs 2 (two) times daily as needed (for respiratory issues.).    Marland Kitchen lidocaine-prilocaine (EMLA) cream Apply 1 application topically as needed. Apply generously over the Mediport 45 minutes prior to chemotherapy. (Patient not taking: Reported on 11/08/2017) 30 g 0  . polyethylene glycol (MIRALAX / GLYCOLAX) packet Take 17 g by mouth daily as needed for mild constipation.    . pseudoephedrine-guaifenesin (MUCINEX D) 60-600 MG 12 hr tablet Take 1 tablet by mouth every 12 (twelve) hours.     No current facility-administered medications for this visit.    Facility-Administered Medications Ordered in Other Visits  Medication Dose Route  Frequency Provider Last Rate Last Dose  . heparin lock flush 100 unit/mL  500 Units Intravenous Once Lloyd Huger, MD      . sodium chloride flush (NS) 0.9 % injection 10 mL  10 mL Intravenous PRN Lloyd Huger, MD        OBJECTIVE: Vitals:   11/08/17 1133  BP: 136/83  Pulse: 71  Resp: 18  Temp: (!) 95.7 F (35.4 C)     Body mass index is 22.95 kg/m.    ECOG FS:0 - Asymptomatic  General: Well-developed, well-nourished, no acute distress. Eyes: Pink conjunctiva, anicteric sclera. HEENT: Normocephalic, moist mucous membranes, clear oropharnyx. Lungs: Clear to auscultation bilaterally. Heart: Regular rate and rhythm. No rubs, murmurs, or gallops. Abdomen: Soft, nontender, nondistended. No organomegaly noted, normoactive bowel sounds. Musculoskeletal: No edema, cyanosis, or clubbing. Neuro: Alert, answering all questions appropriately. Cranial nerves grossly intact. Skin: No rashes or petechiae noted. Psych: Normal affect. Lymphatics: No cervical, calvicular, axillary or inguinal LAD.  LAB RESULTS:  Lab Results  Component Value Date   NA 136 11/08/2017   K 4.1 11/08/2017   CL 102 11/08/2017   CO2 24 11/08/2017    GLUCOSE 94 11/08/2017   BUN 21 (H) 11/08/2017   CREATININE 0.86 11/08/2017   CALCIUM 8.9 11/08/2017   PROT 7.1 11/08/2017   ALBUMIN 3.7 11/08/2017   AST 30 11/08/2017   ALT 23 11/08/2017   ALKPHOS 77 11/08/2017   BILITOT 0.6 11/08/2017   GFRNONAA >60 11/08/2017   GFRAA >60 11/08/2017    Lab Results  Component Value Date   WBC 3.5 (L) 11/08/2017   NEUTROABS 2.5 11/08/2017   HGB 12.3 11/08/2017   HCT 36.1 11/08/2017   MCV 88.0 11/08/2017   PLT 187 11/08/2017     STUDIES: Nm Pet Image Restag (ps) Skull Base To Thigh  Result Date: 10/18/2017 CLINICAL DATA:  Subsequent treatment strategy for right lung cancer. Status post thoracentesis 3 weeks ago. Chemotherapy 4 years ago, radiation in October 2018, currently on immunotherapy. EXAM: NUCLEAR MEDICINE PET SKULL BASE TO THIGH TECHNIQUE: 6.99 mCi F-18 FDG was injected intravenously. Full-ring PET imaging was performed from the skull base to thigh after the radiotracer. CT data was obtained and used for attenuation correction and anatomic localization. Fasting blood glucose: 76 mg/dl COMPARISON:  CT chest dated 09/20/2017 FINDINGS: Mediastinal blood pool activity: SUV max 2.5 NECK: No hypermetabolic cervical lymphadenopathy. Incidental CT findings: none CHEST: Radiation changes in the right lower lobe and right paramediastinal/perihilar region. No focal underlying hypermetabolism to suggest residual/recurrent disease. 5 mm short axis low right paratracheal node (series 3/image 88), max SUV 1.8. 7 mm short axis right IMA node (series 3/image 107), max SUV 2.8. Small right pleural effusion. Associated hypermetabolism laterally with mild complexity on CT (series 3/image 130), max SUV 3.1. Additional pleural nodularity on CT (series 3/image 109), max SUV 2.3. Incidental CT findings: Ectasia of the ascending thoracic aorta, measuring 3.9 cm. Mild atherosclerotic calcifications. Right chest port terminating in the mid SVC. ABDOMEN/PELVIS: No  hypermetabolic abdominopelvic lymphadenopathy. No abnormal hypermetabolism in the liver, spleen, pancreas, or adrenal glands. Incidental CT findings: none SKELETON: Hypermetabolism involving the right anterior 6th rib, suspicious for metastasis. Incidental CT findings: Mild degenerative changes of the visualized thoracolumbar spine. IMPRESSION: Radiation changes in the right hemithorax. 7 mm short axis right IMA node, worrisome for nodal metastasis. Additional 5 mm short axis low right paratracheal node is indeterminate. Small right pleural effusion, mildly complex. Additional pleural-based nodularity. These findings are suggestive  of pleural-based malignancy. 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 Electronically Signed   By: Julian Hy M.D.   On: 10/18/2017 08:57    ONCOLOGY HISTORY: Patient initially underwent resection in May 2015 and was noted to have the EGFR mutation.  She initially received 4 cycles of adjuvant cisplatin and Alimta completing on January 23, 2014.  Patient was on the [facility] protocol for maintenance Tarceva versus placebo, but discontinued treatment in November 2015 because of significant side effects.  Although blinded, presumption was she was receiving Tarceva.  She recently was noted to have a recurrence confirmed by right paratracheal lymph node biopsy on January 24, 2017.  PET scan on February 03, 2017 did not reveal any distant metastasis.  She underwent treatment with weekly carboplatinum and Taxol along with daily XRT finishing in October 2018.  She initiated maintenance durvalumab on June 29, 2017.  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  Patient started on Tagrisso in April 2019.   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  PLAN:   1. 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  Pleural fluid from  thoracentesis with malignant cells consistent with her known lung cancer.  Immunotherapy has been discontinued.  PET scan results from October 18, 2017 reviewed independently and report as above with likely nodal metastasis and possibly bone metastasis in the right anterior sixth rib.  Patient was also given a referral to [facility] by her primary care physician for second opinion.  To our knowledge, they have concurred with the treatment plan.  Continue 80 mg Tagrisso daily until intolerable side effects or progression of disease.  Will reimage in 3 to 4 months.  Return to clinic in 1 month on December 14, 2017 for repeat laboratory work and further evaluation. 2.  Diarrhea: Continue OTC treatments as needed. 3.  Cough: Chronic and unchanged.  Monitor. 4.  Flank pain: Chronic and unchanged.  Monitor.  Approximately 30 minutes was spent in discussion of which greater than 50% was consultation.    Patient expressed understanding and was in agreement with this plan. She also understands that She can call clinic at any time with any questions, concerns, or complaints.   Cancer Staging No matching staging information was found for the patient.  Lloyd Huger, MD   11/12/2017 9:34 AM

## 2017-11-06 NOTE — Patient Instructions (Addendum)
Continue ADVAIR AS prescribed Albuterol as needed and prior to exercise

## 2017-11-08 ENCOUNTER — Other Ambulatory Visit: Payer: Self-pay

## 2017-11-08 ENCOUNTER — Inpatient Hospital Stay: Payer: Medicare Other | Attending: Oncology

## 2017-11-08 ENCOUNTER — Inpatient Hospital Stay (HOSPITAL_BASED_OUTPATIENT_CLINIC_OR_DEPARTMENT_OTHER): Payer: Medicare Other | Admitting: Oncology

## 2017-11-08 VITALS — BP 136/83 | HR 71 | Temp 95.7°F | Resp 18 | Wt 133.7 lb

## 2017-11-08 DIAGNOSIS — C3491 Malignant neoplasm of unspecified part of right bronchus or lung: Secondary | ICD-10-CM | POA: Insufficient documentation

## 2017-11-08 DIAGNOSIS — R197 Diarrhea, unspecified: Secondary | ICD-10-CM | POA: Diagnosis not present

## 2017-11-08 DIAGNOSIS — C3490 Malignant neoplasm of unspecified part of unspecified bronchus or lung: Secondary | ICD-10-CM

## 2017-11-08 DIAGNOSIS — C7951 Secondary malignant neoplasm of bone: Secondary | ICD-10-CM | POA: Diagnosis not present

## 2017-11-08 DIAGNOSIS — C342 Malignant neoplasm of middle lobe, bronchus or lung: Secondary | ICD-10-CM

## 2017-11-08 LAB — CBC WITH DIFFERENTIAL/PLATELET
BASOS ABS: 0 10*3/uL (ref 0–0.1)
Basophils Relative: 1 %
EOS ABS: 0.1 10*3/uL (ref 0–0.7)
EOS PCT: 3 %
HCT: 36.1 % (ref 35.0–47.0)
HEMOGLOBIN: 12.3 g/dL (ref 12.0–16.0)
LYMPHS ABS: 0.6 10*3/uL — AB (ref 1.0–3.6)
LYMPHS PCT: 16 %
MCH: 30.1 pg (ref 26.0–34.0)
MCHC: 34.2 g/dL (ref 32.0–36.0)
MCV: 88 fL (ref 80.0–100.0)
Monocytes Absolute: 0.3 10*3/uL (ref 0.2–0.9)
Monocytes Relative: 9 %
NEUTROS PCT: 71 %
Neutro Abs: 2.5 10*3/uL (ref 1.4–6.5)
Platelets: 187 10*3/uL (ref 150–440)
RBC: 4.1 MIL/uL (ref 3.80–5.20)
RDW: 14.4 % (ref 11.5–14.5)
WBC: 3.5 10*3/uL — AB (ref 3.6–11.0)

## 2017-11-08 LAB — COMPREHENSIVE METABOLIC PANEL
ALT: 23 U/L (ref 14–54)
AST: 30 U/L (ref 15–41)
Albumin: 3.7 g/dL (ref 3.5–5.0)
Alkaline Phosphatase: 77 U/L (ref 38–126)
Anion gap: 10 (ref 5–15)
BILIRUBIN TOTAL: 0.6 mg/dL (ref 0.3–1.2)
BUN: 21 mg/dL — ABNORMAL HIGH (ref 6–20)
CHLORIDE: 102 mmol/L (ref 101–111)
CO2: 24 mmol/L (ref 22–32)
Calcium: 8.9 mg/dL (ref 8.9–10.3)
Creatinine, Ser: 0.86 mg/dL (ref 0.44–1.00)
GFR calc Af Amer: >60 mL/min (ref >60)
Glucose, Bld: 94 mg/dL (ref 65–99)
POTASSIUM: 4.1 mmol/L (ref 3.5–5.1)
Sodium: 136 mmol/L (ref 135–145)
TOTAL PROTEIN: 7.1 g/dL (ref 6.5–8.1)

## 2017-11-08 MED ORDER — HEPARIN SOD (PORK) LOCK FLUSH 100 UNIT/ML IV SOLN: 500.0000 [IU] | Freq: Once | INTRAVENOUS | Status: AC

## 2017-11-08 MED ORDER — SODIUM CHLORIDE 0.9% FLUSH
10.0000 mL | INTRAVENOUS | Status: AC | PRN
  Filled 2017-11-08: qty 10

## 2017-11-08 NOTE — Progress Notes (Signed)
Here for follow up. Per pt having R sided under R breast pain as well as R sided thoracic region pain. See pain assessment. Pt active as she is now retired !

## 2017-11-09 LAB — THYROID PANEL WITH TSH
FREE THYROXINE INDEX: 1.2 (ref 1.2–4.9)
T3 UPTAKE RATIO: 20 % — AB (ref 24–39)
T4, Total: 6.1 ug/dL (ref 4.5–12.0)
TSH: 2.21 u[IU]/mL (ref 0.450–4.500)

## 2017-11-15 LAB — ACID FAST CULTURE WITH REFLEXED SENSITIVITIES (MYCOBACTERIA)

## 2017-11-15 LAB — ACID FAST CULTURE WITH REFLEXED SENSITIVITIES: ACID FAST CULTURE - AFSCU3: NEGATIVE

## 2017-11-26 ENCOUNTER — Encounter: Payer: Self-pay | Admitting: Oncology

## 2017-11-29 ENCOUNTER — Inpatient Hospital Stay: Payer: Medicare Other | Admitting: Oncology

## 2017-12-01 ENCOUNTER — Inpatient Hospital Stay (HOSPITAL_BASED_OUTPATIENT_CLINIC_OR_DEPARTMENT_OTHER): Payer: Medicare Other | Admitting: Nurse Practitioner

## 2017-12-01 ENCOUNTER — Encounter: Payer: Self-pay | Admitting: Nurse Practitioner

## 2017-12-01 VITALS — BP 145/89 | HR 80 | Temp 97.8°F | Resp 18 | Wt 130.0 lb

## 2017-12-01 DIAGNOSIS — R197 Diarrhea, unspecified: Secondary | ICD-10-CM

## 2017-12-01 DIAGNOSIS — K1379 Other lesions of oral mucosa: Secondary | ICD-10-CM

## 2017-12-01 DIAGNOSIS — C7951 Secondary malignant neoplasm of bone: Secondary | ICD-10-CM | POA: Diagnosis not present

## 2017-12-01 DIAGNOSIS — S80862A Insect bite (nonvenomous), left lower leg, initial encounter: Secondary | ICD-10-CM

## 2017-12-01 DIAGNOSIS — C3491 Malignant neoplasm of unspecified part of right bronchus or lung: Secondary | ICD-10-CM | POA: Diagnosis not present

## 2017-12-01 DIAGNOSIS — C342 Malignant neoplasm of middle lobe, bronchus or lung: Secondary | ICD-10-CM

## 2017-12-01 DIAGNOSIS — R239 Unspecified skin changes: Secondary | ICD-10-CM

## 2017-12-01 DIAGNOSIS — W57XXXA Bitten or stung by nonvenomous insect and other nonvenomous arthropods, initial encounter: Secondary | ICD-10-CM

## 2017-12-01 DIAGNOSIS — R234 Changes in skin texture: Secondary | ICD-10-CM

## 2017-12-01 DIAGNOSIS — T451X5A Adverse effect of antineoplastic and immunosuppressive drugs, initial encounter: Secondary | ICD-10-CM

## 2017-12-01 MED ORDER — TRIAMCINOLONE ACETONIDE 0.025 % EX OINT: 1.0000 "application " | TOPICAL_OINTMENT | Freq: Two times a day (BID) | CUTANEOUS | 0 refills | Status: DC

## 2017-12-01 MED ORDER — TRIAMCINOLONE ACETONIDE 0.1 % EX CREA: 1.0000 "application " | TOPICAL_CREAM | Freq: Two times a day (BID) | CUTANEOUS | 0 refills | Status: DC

## 2017-12-01 NOTE — Progress Notes (Signed)
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  She initially received 4 cycles of adjuvant cisplatin and Alimta completing on January 23, 2014.  Patient was on the [facility] protocol for maintenance Tarceva versus placebo, but discontinued treatment in November 2015 because of significant side effects.  Although blinded, presumption was she was receiving Tarceva.  She recently was noted to have a recurrence confirmed by right paratracheal lymph node biopsy on January 24, 2017.  PET scan on February 03, 2017 did not reveal any distant metastasis.  She underwent treatment with weekly carboplatinum and Taxol along with daily XRT finishing in October 2018.  She initiated maintenance durvalumab on June 29, 2017.  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  Patient started on Tagrisso in April 2019.  Oncology History   # July-AUG 2018- RECURRENT STAGE III Adeno Ca [EBUS; Right paratrach LN [Dr.Ram & Dr.Oaks] insuff tissue for testing; check liquid Bx]; PET- no distant mets.  # AUG 13th 2018- Carbo-Taxol with RT [finish oct 12th 2018]  ------------------------------------------------------------------------------   # MAY 2015- .Marland KitchenA right middle lobe adenocarcinoma s/p resection [Dr.Oaks].  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  may of 2015 4.Patient has finished 4 cycles of chemotherapy  on January 23, 2014 Now patient would be randomized   to  our [facility] protocol for Tarceva vs. placebo because of EGFR mutation 3.patient desires to get off protocol because of progressive side effect even after reducing dose.  (May 13, 2014) 5.Patient is starting  maintenance investigationally approach with Tarceva vs. placebo September of 2015. 6.Patient came off protocol therapy because of significant GI side effect May 13, 2014   Patient is nonsmoker;        Primary cancer of right middle lobe of lung ([place])    Interval history-  Lauren Page presents with a rash located on her upper back. Symptoms started within past week. She has not noticed it spreading. She has been applying hydrocortisone to the area and is unsure if it is helping. She has not been evaluated for symptoms previously and does not believe she's had these symptoms previously. She reports rash is itching and irritated. Additionally, she complains of mouth sores that started several weeks ago. With home treatment they resolved and reappeared. She reports sores on her tongue and inside her lips. She has been treating at home with hydrogen peroxide rinses, baking soda rinses, triamcinolone dental paste. Initially, symptoms improved with home treatment but most recent occurrence has not resolved. She has been in the sun recently and exercises regularly in the sun. She uses sunscreen however. She also reports a bug bite to her left shin which occurred 1-2 days ago. She feels symptoms are improving but have not resolved. She has applied hydrocortisone to the area. She expresses frustration that these symptoms were not able to be treated over the phone.   ECOG FS:0 - Asymptomatic  Review of systems- Review  of Systems  Constitutional: Negative for chills, fever, malaise/fatigue and weight loss.  HENT: Negative for congestion, ear discharge, ear pain, sinus pain, sore throat and tinnitus.        Mouth sores  Eyes: Negative.    Respiratory: Negative.  Negative for cough, sputum production and shortness of breath.   Cardiovascular: Negative for chest pain, palpitations, orthopnea, claudication and leg swelling.  Gastrointestinal: Negative for abdominal pain, blood in stool, constipation, diarrhea, heartburn, nausea and vomiting.  Genitourinary: Negative.   Musculoskeletal: Negative.   Skin: Positive for itching and rash.  Neurological: Negative for dizziness, tingling, weakness and headaches.  Endo/Heme/Allergies: Negative.   Psychiatric/Behavioral: Negative.      Current treatment- Tagrisso  Allergies  Allergen Reactions  . Penicillins Swelling and Rash    Has patient had a PCN reaction causing immediate rash, facial/tongue/throat swelling, SOB or lightheadedness with hypotension: Unknown Has patient had a PCN reaction causing severe rash involving mucus membranes or skin necrosis: Unknown Has patient had a PCN reaction that required hospitalization: No Has patient had a PCN reaction occurring within the last 10 years: No If all of the above answers are "NO", then may proceed with Cephalosporin use.    . Sulfa Antibiotics Nausea And Vomiting     Past Medical History:  Diagnosis Date  . Acid reflux   . Anemia    HAD TO HAVE IRON INFUSIONS BACK IN 2015  . Arthritis    bil hands and back  . Asthma   . Cancer of right lung ([facility]) 2015   lung cancer - chemo, Dr Genevive Bi removed RML Lobectomy.   Marland Kitchen Dyspnea    WITH EXERTION  . Hypertension   . Mitral valve prolapse   . Primary cancer of right middle lobe of lung ([place]) 03/11/2016     Past Surgical History:  Procedure Laterality Date  . ABDOMINAL HYSTERECTOMY    . BREAST BIOPSY Left 1994 (-), 2004 (-)  . COLONOSCOPY  2008    Dr. Donnella Sham  . KNEE SURGERY  2007  . LUNG LOBECTOMY Right    middle lobe  . PORTACATH PLACEMENT Right 02/09/2017   Procedure: INSERTION PORT-A-CATH;  Surgeon: Nestor Lewandowsky, MD;  Location: [facility] ORS;  Service: General;  Laterality:  Right;  . SALIVARY GLAND SURGERY Left 1983   with lymph node removal also  . UPPER GI ENDOSCOPY  2008  . VIDEO BRONCHOSCOPY WITH ENDOBRONCHIAL ULTRASOUND Right 01/24/2017   Procedure: VIDEO BRONCHOSCOPY WITH ENDOBRONCHIAL ULTRASOUND;  Surgeon: [name], MD;  Location: [facility] ORS;  Service: Pulmonary;  Laterality: Right;    Social History   Socioeconomic History  . Marital status: Married    Spouse name: Not on file  . Number of children: Not on file  . Years of education: Not on file  . Highest education level: Not on file  Occupational History  . Not on file  Social Needs  . Financial resource strain: Not on file  . Food insecurity:    Worry: Not on file    Inability: Not on file  . Transportation needs:    Medical: Not on file    Non-medical: Not on file  Tobacco Use  . Smoking status: Never Smoker  . Smokeless tobacco: Never Used  Substance and Sexual Activity  . Alcohol use: Yes    Alcohol/week: 4.2 oz    Types: 7 Glasses of wine per week  . Drug use: No  . Sexual activity: Not on file  Lifestyle  . Physical activity:  Days per week: Not on file    Minutes per session: Not on file  . Stress: Not on file  Relationships  . Social connections:    Talks on phone: Not on file    Gets together: Not on file    Attends religious service: Not on file    Active member of club or organization: Not on file    Attends meetings of clubs or organizations: Not on file    Relationship status: Not on file  . Intimate partner violence:    Fear of current or ex partner: Not on file    Emotionally abused: Not on file    Physically abused: Not on file    Forced sexual activity: Not on file  Other Topics Concern  . Not on file  Social History Narrative  . Not on file    Family History  Problem Relation Age of Onset  . Breast cancer Maternal Aunt   . Breast cancer Paternal Aunt   . Breast cancer Maternal Grandmother   . Breast cancer Maternal Aunt   . Breast  cancer Paternal Aunt   . Healthy Mother   . Healthy Father      Current Outpatient Medications:  .  albuterol (PROVENTIL HFA;VENTOLIN HFA) 108 (90 Base) MCG/ACT inhaler, Inhale 1-2 puffs into the lungs every 6 (six) hours as needed for wheezing or shortness of breath., Disp: , Rfl:  .  B Complex Vitamins (VITAMIN B COMPLEX PO), Take 1 tablet by mouth daily. , Disp: , Rfl:  .  Biotin w/ Vitamins C & E (HAIR/SKIN/NAILS PO), Take 1 tablet by mouth daily., Disp: , Rfl:  .  Calcium Carb-Cholecalciferol (CALCIUM 600+D) 600-800 MG-UNIT TABS, Take 2 tablets by mouth daily., Disp: , Rfl:  .  Cholecalciferol (D3 MAXIMUM STRENGTH) 5000 units capsule, Take 5,000 Units by mouth daily., Disp: , Rfl:  .  COLLAGEN PO, Take 1,000 mg by mouth daily., Disp: , Rfl:  .  cyclobenzaprine (FLEXERIL) 5 MG tablet, Take 1 tablet (5 mg total) by mouth 3 (three) times daily as needed for muscle spasms., Disp: 30 tablet, Rfl: 1 .  fexofenadine (ALLEGRA) 180 MG tablet, Take 180 mg by mouth daily as needed for allergies. , Disp: , Rfl:  .  fluticasone (FLONASE) 50 MCG/ACT nasal spray, Place 2 sprays into both nostrils daily as needed for allergies or rhinitis., Disp: , Rfl:  .  Fluticasone-Salmeterol (ADVAIR) 250-50 MCG/DOSE AEPB, Inhale 1 puff into the lungs 2 (two) times daily as needed (for respiratory issues.)., Disp: , Rfl:  .  lansoprazole (PREVACID) 30 MG capsule, Take 30 mg by mouth 2 (two) times daily before a meal. , Disp: , Rfl:  .  lidocaine-prilocaine (EMLA) cream, Apply 1 application topically as needed. Apply generously over the Mediport 45 minutes prior to chemotherapy., Disp: 30 g, Rfl: 0 .  losartan (COZAAR) 25 MG tablet, Take 25 mg by mouth at bedtime. , Disp: , Rfl:  .  metoCLOPramide (REGLAN) 10 MG tablet, Take 1 tablet (10 mg total) by mouth every 8 (eight) hours as needed for nausea., Disp: 40 tablet, Rfl: 3 .  ondansetron (ZOFRAN) 8 MG tablet, Take 1 tablet (8 mg total) by mouth every 8 (eight) hours as  needed for nausea or vomiting (start 3 days; after chemo)., Disp: 40 tablet, Rfl: 1 .  osimertinib mesylate (TAGRISSO) 80 MG tablet, Take 1 tablet (80 mg total) by mouth daily., Disp: 30 tablet, Rfl: 5 .  polyethylene glycol (MIRALAX / GLYCOLAX) packet,  Take 17 g by mouth daily as needed for mild constipation., Disp: , Rfl:  .  PREMARIN 1.25 MG tablet, , Disp: , Rfl: 1 .  prochlorperazine (COMPAZINE) 10 MG tablet, Take 1 tablet (10 mg total) by mouth every 6 (six) hours as needed for nausea or vomiting., Disp: 40 tablet, Rfl: 1 .  pseudoephedrine-guaifenesin (MUCINEX D) 60-600 MG 12 hr tablet, Take 1 tablet by mouth every 12 (twelve) hours., Disp: , Rfl:  No current facility-administered medications for this visit.   Facility-Administered Medications Ordered in Other Visits:  .  heparin lock flush 100 unit/mL, 500 Units, Intravenous, Once, Finnegan, Kathlene November, MD .  sodium chloride flush (NS) 0.9 % injection 10 mL, 10 mL, Intravenous, PRN, Lloyd Huger, MD  Physical exam:  Vitals:   12/01/17 1343 12/01/17 1353  BP: (!) 145/89   Pulse: 80   Resp: 18   Temp: 97.8 F (36.6 C)   TempSrc: Tympanic   SpO2:  99%  Weight: 130 lb (59 kg)    Physical Exam  Constitutional: She is oriented to person, place, and time. She appears well-developed and well-nourished.  HENT:  Head: Atraumatic.  Nose: Nose normal.  Mouth/Throat: Oral lesions present. Posterior oropharyngeal erythema present. No oropharyngeal exudate.  Ulcerations and white patches visible on oropharyx, mucous membranes, and tongue- grade 1-2  Eyes: Conjunctivae are normal. No scleral icterus.  Neck: Normal range of motion.  Cardiovascular: Normal rate, regular rhythm and normal heart sounds.  Pulmonary/Chest: Effort normal and breath sounds normal.  Abdominal: Soft. Bowel sounds are normal.  Musculoskeletal: She exhibits no edema.  Neurological: She is alert and oriented to person, place, and time.  Skin: Skin is warm and  dry. Rash noted. Rash is macular.  Macular rash on upper back between shoulder blades. Appears pruritic. Some popped blisters.   Left front of calf- erythematous, hot to touch, single inflamed lesion  Psychiatric: She has a normal mood and affect.     CMP Latest Ref Rng & Units 11/08/2017  Glucose 65 - 99 mg/dL 94  BUN 6 - 20 mg/dL 21(H)  Creatinine 0.44 - 1.00 mg/dL 0.86  Sodium 135 - 145 mmol/L 136  Potassium 3.5 - 5.1 mmol/L 4.1  Chloride 101 - 111 mmol/L 102  CO2 22 - 32 mmol/L 24  Calcium 8.9 - 10.3 mg/dL 8.9  Total Protein 6.5 - 8.1 g/dL 7.1  Total Bilirubin 0.3 - 1.2 mg/dL 0.6  Alkaline Phos 38 - 126 U/L 77  AST 15 - 41 U/L 30  ALT 14 - 54 U/L 23   CBC Latest Ref Rng & Units 11/08/2017  WBC 3.6 - 11.0 K/uL 3.5(L)  Hemoglobin 12.0 - 16.0 g/dL 12.3  Hematocrit 35.0 - 47.0 % 36.1  Platelets 150 - 440 K/uL 187    No images are attached to the encounter.  No results found.   Assessment and plan- Patient is a 67 y.o. female with history of recurrent stage IV adenocarcinoma of the lung who presents to [facility] for complaints of stomatitis, rash, and bug bite.  1.  Stage IV adenocarcinoma of the lung-pleural fluid from thoracentesis consistent with known lung cancer.  Immunotherapy has been discontinued.  PET scan from 10/18/2017 shows likely nodal metastasis and possible bone metastasis in the anterior right sixth rib.  Currently on Tagrisso 80 mg.  Follow-up with Dr. Grayland Ormond as scheduled.  2.  Stomatitis-likely due to [employer]. Discontinued home hydrogen peroxide rinses as no debridement needed. Continue baking soda rinses and increase  to 3-6 times per day.  Prescription for Magic mouthwash sent to pharmacy.  3.  Chemotherapy-induced skin rash-start triamcinolone 0.1% cream.  Avoid sun exposure as this may worsen skin rash.  4. Insect Bite- keep clean, cool compresses for comfort. Neosporin topically to avoid secondary infection.   Follow-up Dr. Grayland Ormond if symptoms persist or do  not improve over next 3-4 days as we may need to consider dose adjustments or changes to treatment. Otherwise, return to clinic as scheduled for re-evaluation on 12/18/17.    Visit Diagnosis 1. Primary cancer of right middle lobe of lung ([place])   2. Skin changes related to chemotherapy   3. Mouth sore secondary to chemotherapy   4. Insect bite of left lower leg, initial encounter     Patient expressed understanding and was in agreement with this plan. She also understands that She can call clinic at any time with any questions, concerns, or complaints. Thank you for allowing me to participate in the care of this very pleasant patient.     Beckey Rutter, DNP, AGNP-C [place] [facility] [phone] (work cell) [phone] (office)

## 2017-12-08 ENCOUNTER — Ambulatory Visit
Admission: RE | Admit: 2017-12-08 | Discharge: 2017-12-08 | Disposition: A | Discharge status: Home / Self Care | Payer: Medicare Other | Source: Ambulatory Visit | Attending: Internal Medicine | Admitting: Internal Medicine

## 2017-12-08 DIAGNOSIS — Z1231 Encounter for screening mammogram for malignant neoplasm of breast: Secondary | ICD-10-CM | POA: Insufficient documentation

## 2017-12-08 HISTORY — DX: Personal history of antineoplastic chemotherapy: Z92.21

## 2017-12-08 HISTORY — DX: Personal history of irradiation: Z92.3

## 2017-12-14 ENCOUNTER — Other Ambulatory Visit: Payer: Medicare Other

## 2017-12-14 ENCOUNTER — Ambulatory Visit: Payer: Medicare Other | Admitting: Oncology

## 2017-12-17 NOTE — Progress Notes (Signed)
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  INTERVAL HISTORY: Patient returns to clinic today for repeat laboratory and further evaluation.  She complains of raised reddened areas consistent with folliculitis on her arms and legs.  She also has significant mouth pain, but denies dysphasia.  She otherwise feels well. She continues to have right flank pain that is chronic in nature.  She also continues to be highly anxious. She has no neurologic complaints.  She denies any recent fevers or illnesses.  She has a good appetite and denies weight loss.  She has no chest pain or hemoptysis.  She denies any nausea, vomiting, constipation, or diarrhea.  She has no urinary complaints.  Patient offers no further specific complaints today.  REVIEW OF SYSTEMS:   Review of Systems  Constitutional: Negative.  Negative for fever, malaise/fatigue and weight loss.  Respiratory: Positive for cough. Negative for hemoptysis and shortness of breath.   Cardiovascular: Negative.  Negative for chest pain and leg swelling.  Gastrointestinal: Negative for abdominal pain, constipation and diarrhea.  Genitourinary: Positive for flank pain. Negative for dysuria.  Musculoskeletal: Negative for back pain.  Skin: Positive for rash.  Neurological: Negative.  Negative for sensory change, focal weakness and weakness.  Psychiatric/Behavioral: The patient is nervous/anxious.     As per HPI. Otherwise, a complete review of systems is negative.  PAST MEDICAL HISTORY: Past Medical History:  Diagnosis Date  . Acid reflux   . Anemia    HAD TO HAVE IRON INFUSIONS BACK IN 2015  . Arthritis    bil hands and back  . Asthma   . Cancer of right lung ([place]) 2015   lung cancer - chemo, Dr  Genevive Bi removed RML Lobectomy.   Marland Kitchen Dyspnea    WITH EXERTION  . Hypertension   . Mitral valve prolapse   . Personal history of chemotherapy    F/U Lung Cancer  . Personal history of radiation therapy    Lung cancer  . Primary cancer of right middle lobe of lung ([place]) 03/11/2016    PAST SURGICAL HISTORY: Past Surgical History:  Procedure Laterality Date  . ABDOMINAL HYSTERECTOMY    . BREAST EXCISIONAL BIOPSY Left 1994 (-), 2004 (-)   benign  . COLONOSCOPY  2008    Dr. Donnella Sham  . KNEE SURGERY  2007  . LUNG LOBECTOMY Right    middle lobe  . PORTACATH PLACEMENT Right 02/09/2017   Procedure: INSERTION PORT-A-CATH;  Surgeon: Nestor Lewandowsky, MD;  Location: [facility] ORS;  Service: General;  Laterality: Right;  . SALIVARY GLAND SURGERY Left 1983   with lymph node removal also  . UPPER GI ENDOSCOPY  2008  . VIDEO BRONCHOSCOPY WITH ENDOBRONCHIAL ULTRASOUND Right 01/24/2017   Procedure: VIDEO BRONCHOSCOPY WITH ENDOBRONCHIAL ULTRASOUND;  Surgeon: [name], MD;  Location: [facility] ORS;  Service: Pulmonary;  Laterality: Right;    FAMILY HISTORY: Family History  Problem Relation Age of Onset  . Breast cancer Maternal Aunt   . Breast cancer Paternal Aunt   . Breast cancer Maternal Grandmother   . Breast cancer Maternal Aunt   . Breast cancer Paternal Aunt   . Healthy Mother   . Healthy Father     ADVANCED DIRECTIVES (Y/N):  N  HEALTH MAINTENANCE: Social History   Tobacco Use  .  Smoking status: Never Smoker  . Smokeless tobacco: Never Used  Substance Use Topics  . Alcohol use: Yes    Alcohol/week: 4.2 oz    Types: 7 Glasses of wine per week  . Drug use: No     Colonoscopy:  PAP:  Bone density:  Lipid panel:  Allergies  Allergen Reactions  . Penicillins Swelling and Rash    Has patient had a PCN reaction causing immediate rash, facial/tongue/throat swelling, SOB or lightheadedness with hypotension: Unknown Has patient had a PCN reaction causing severe rash involving mucus  membranes or skin necrosis: Unknown Has patient had a PCN reaction that required hospitalization: No Has patient had a PCN reaction occurring within the last 10 years: No If all of the above answers are "NO", then may proceed with Cephalosporin use.    . Sulfa Antibiotics Nausea And Vomiting    Current Outpatient Medications  Medication Sig Dispense Refill  . B Complex Vitamins (VITAMIN B COMPLEX PO) Take 1 tablet by mouth daily.     . Biotin w/ Vitamins C & E (HAIR/SKIN/NAILS PO) Take 1 tablet by mouth daily.    . Calcium Carb-Cholecalciferol (CALCIUM 600+D) 600-800 MG-UNIT TABS Take 2 tablets by mouth daily.    . Cholecalciferol (D3 MAXIMUM STRENGTH) 5000 units capsule Take 5,000 Units by mouth daily.    . COLLAGEN PO Take 1,000 mg by mouth daily.    . fexofenadine (ALLEGRA) 180 MG tablet Take 180 mg by mouth daily as needed for allergies.     . fluticasone (FLONASE) 50 MCG/ACT nasal spray Place 2 sprays into both nostrils daily as needed for allergies or rhinitis.    . Fluticasone-Salmeterol (ADVAIR) 250-50 MCG/DOSE AEPB Inhale 1 puff into the lungs 2 (two) times daily as needed (for respiratory issues.).    Marland Kitchen lansoprazole (PREVACID) 30 MG capsule Take 30 mg by mouth 2 (two) times daily before a meal.     . lidocaine-prilocaine (EMLA) cream Apply 1 application topically as needed. Apply generously over the Mediport 45 minutes prior to chemotherapy. 30 g 0  . losartan (COZAAR) 25 MG tablet Take 25 mg by mouth daily.     . magic mouthwash w/lidocaine SOLN Take 10 mLs by mouth 4 (four) times daily as needed for mouth pain.    Marland Kitchen osimertinib mesylate (TAGRISSO) 80 MG tablet Take 1 tablet (80 mg total) by mouth daily. 30 tablet 5  . PREMARIN 1.25 MG tablet   1  . prochlorperazine (COMPAZINE) 10 MG tablet Take 1 tablet (10 mg total) by mouth every 6 (six) hours as needed for nausea or vomiting. 40 tablet 1  . triamcinolone cream (KENALOG) 0.1 % Apply 1 application topically 2 (two) times daily.  80 g 0  . albuterol (PROVENTIL HFA;VENTOLIN HFA) 108 (90 Base) MCG/ACT inhaler Inhale 1-2 puffs into the lungs every 6 (six) hours as needed for wheezing or shortness of breath.    . cyclobenzaprine (FLEXERIL) 5 MG tablet Take 1 tablet (5 mg total) by mouth 3 (three) times daily as needed for muscle spasms. (Patient not taking: Reported on 12/18/2017) 30 tablet 1  . doxycycline (VIBRA-TABS) 100 MG tablet Take 1 tablet (100 mg total) by mouth 2 (two) times daily. 20 tablet 0  . hydrOXYzine (ATARAX/VISTARIL) 25 MG tablet   0  . losartan (COZAAR) 25 MG tablet Take 25 mg by mouth at bedtime.     . metoCLOPramide (REGLAN) 10 MG tablet Take 1 tablet (10 mg total) by mouth every 8 (eight) hours as  needed for nausea. (Patient not taking: Reported on 12/18/2017) 40 tablet 3  . ondansetron (ZOFRAN) 8 MG tablet Take 1 tablet (8 mg total) by mouth every 8 (eight) hours as needed for nausea or vomiting (start 3 days; after chemo). (Patient not taking: Reported on 12/18/2017) 40 tablet 1  . polyethylene glycol (MIRALAX / GLYCOLAX) packet Take 17 g by mouth daily as needed for mild constipation.    . pseudoephedrine-guaifenesin (MUCINEX D) 60-600 MG 12 hr tablet Take 1 tablet by mouth every 12 (twelve) hours.     No current facility-administered medications for this visit.    Facility-Administered Medications Ordered in Other Visits  Medication Dose Route Frequency Provider Last Rate Last Dose  . heparin lock flush 100 unit/mL  500 Units Intravenous Once Lloyd Huger, MD      . sodium chloride flush (NS) 0.9 % injection 10 mL  10 mL Intravenous PRN Lloyd Huger, MD        OBJECTIVE: Vitals:   12/18/17 1129  BP: 129/87  Pulse: 70  Resp: 18  Temp: 97.8 F (36.6 C)     Body mass index is 22.43 kg/m.    ECOG FS:0 - Asymptomatic  General: Well-developed, well-nourished, no acute distress. Eyes: Pink conjunctiva, anicteric sclera. HEENT: Normocephalic, moist mucous membranes, clear  oropharnyx. Lungs: Clear to auscultation bilaterally. Heart: Regular rate and rhythm. No rubs, murmurs, or gallops. Abdomen: Soft, nontender, nondistended. No organomegaly noted, normoactive bowel sounds. Musculoskeletal: No edema, cyanosis, or clubbing. Neuro: Alert, answering all questions appropriately. Cranial nerves grossly intact. Skin: Various pustules arms and legs consistent with  folliculitis.   Psych: Normal affect. Lymphatics: No cervical, calvicular, axillary or inguinal LAD.  LAB RESULTS:  Lab Results  Component Value Date   NA 134 (L) 12/18/2017   K 4.0 12/18/2017   CL 104 12/18/2017   CO2 24 12/18/2017   GLUCOSE 70 12/18/2017   BUN 11 12/18/2017   CREATININE 0.79 12/18/2017   CALCIUM 8.9 12/18/2017   PROT 6.9 12/18/2017   ALBUMIN 3.5 12/18/2017   AST 32 12/18/2017   ALT 22 12/18/2017   ALKPHOS 61 12/18/2017   BILITOT 0.5 12/18/2017   GFRNONAA >60 12/18/2017   GFRAA >60 12/18/2017    Lab Results  Component Value Date   WBC 4.4 12/18/2017   NEUTROABS 3.2 12/18/2017   HGB 11.5 (L) 12/18/2017   HCT 32.9 (L) 12/18/2017   MCV 87.1 12/18/2017   PLT 204 12/18/2017     STUDIES: Mm 3d Screen Breast Bilateral  Result Date: 12/08/2017 CLINICAL DATA:  Screening. EXAM: DIGITAL SCREENING BILATERAL MAMMOGRAM WITH TOMO AND CAD COMPARISON:  Previous exam(s). ACR Breast Density Category c: The breast tissue is heterogeneously dense, which may obscure small masses. FINDINGS: There are no findings suspicious for malignancy. Images were processed with CAD. IMPRESSION: No mammographic evidence of malignancy. A result letter of this screening mammogram will be mailed directly to the patient. RECOMMENDATION: Screening mammogram in one year. (Code:SM-B-01Y) BI-RADS CATEGORY  1: Negative. Electronically Signed   By: Margarette Canada M.D.   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  She initially  received 4 cycles of adjuvant cisplatin and Alimta completing on January 23, 2014.  Patient was on the [facility] protocol for maintenance Tarceva versus placebo, but discontinued treatment in November 2015 because of significant side effects.  Although blinded, presumption was she was receiving Tarceva.  She recently  was noted to have a recurrence confirmed by right paratracheal lymph node biopsy on January 24, 2017.  PET scan on February 03, 2017 did not reveal any distant metastasis.  She underwent treatment with weekly carboplatinum and Taxol along with daily XRT finishing in October 2018.  She initiated maintenance durvalumab on June 29, 2017.  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  Patient started on Tagrisso in April 2019.   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  PLAN:   1. 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  Pleural fluid from thoracentesis with malignant cells consistent with her known lung cancer.  Immunotherapy has been discontinued.  PET scan results from October 18, 2017 reviewed independently with likely nodal metastasis and possibly bone metastasis in the right anterior sixth rib.  Patient was also given a referral to [facility] by her primary care physician for second opinion.  To our knowledge, they have concurred with the treatment plan.  Despite mouth pain and folliculitis-like rash, patient has requested to continue 80 mg Tagrisso daily without dose reduction to 40 mg at this time.  She would like to continue current treatment at least until her next scheduled PET scan in 4 weeks prior to next treatment.  Return to clinic after her imaging for repeat laboratory work and further evaluation. 2.  Diarrhea: Continue OTC treatments as needed. 3.  Cough: Chronic and unchanged.  Monitor. 4.  Flank pain: Chronic and unchanged.   Monitor. 5.  Rash/folliculitis: Patient was given a prescription for doxycycline. 6.  Mouth pain: Continue Magic mouthwash as needed.    I spent a total of 30 minutes face-to-face with the patient of which greater than 50% of the visit was spent in counseling and coordination of care as summarized above.  Patient expressed understanding and was in agreement with this plan. She also understands that She can call clinic at any time with any questions, concerns, or complaints.   Cancer Staging No matching staging information was found for the patient.  Lloyd Huger, MD   12/23/2017 6:36 AM

## 2017-12-18 ENCOUNTER — Other Ambulatory Visit: Payer: Self-pay

## 2017-12-18 ENCOUNTER — Inpatient Hospital Stay: Payer: Medicare Other | Attending: Oncology

## 2017-12-18 ENCOUNTER — Inpatient Hospital Stay (HOSPITAL_BASED_OUTPATIENT_CLINIC_OR_DEPARTMENT_OTHER): Payer: Medicare Other | Admitting: Oncology

## 2017-12-18 VITALS — BP 129/87 | HR 70 | Temp 97.8°F | Resp 18 | Wt 130.7 lb

## 2017-12-18 DIAGNOSIS — G8929 Other chronic pain: Secondary | ICD-10-CM | POA: Insufficient documentation

## 2017-12-18 DIAGNOSIS — C342 Malignant neoplasm of middle lobe, bronchus or lung: Secondary | ICD-10-CM

## 2017-12-18 DIAGNOSIS — J910 Malignant pleural effusion: Secondary | ICD-10-CM | POA: Diagnosis not present

## 2017-12-18 DIAGNOSIS — R21 Rash and other nonspecific skin eruption: Secondary | ICD-10-CM | POA: Insufficient documentation

## 2017-12-18 DIAGNOSIS — C3490 Malignant neoplasm of unspecified part of unspecified bronchus or lung: Secondary | ICD-10-CM | POA: Diagnosis not present

## 2017-12-18 DIAGNOSIS — R197 Diarrhea, unspecified: Secondary | ICD-10-CM

## 2017-12-18 DIAGNOSIS — Z452 Encounter for adjustment and management of vascular access device: Secondary | ICD-10-CM | POA: Diagnosis not present

## 2017-12-18 LAB — COMPREHENSIVE METABOLIC PANEL
ALBUMIN: 3.5 g/dL (ref 3.5–5.0)
ALT: 22 U/L (ref 14–54)
ANION GAP: 6 (ref 5–15)
AST: 32 U/L (ref 15–41)
Alkaline Phosphatase: 61 U/L (ref 38–126)
BUN: 11 mg/dL (ref 6–20)
CHLORIDE: 104 mmol/L (ref 101–111)
CO2: 24 mmol/L (ref 22–32)
Calcium: 8.9 mg/dL (ref 8.9–10.3)
Creatinine, Ser: 0.79 mg/dL (ref 0.44–1.00)
GFR calc Af Amer: >60 mL/min (ref >60)
GFR calc non Af Amer: >60 mL/min (ref >60)
GLUCOSE: 70 mg/dL (ref 65–99)
Potassium: 4 mmol/L (ref 3.5–5.1)
Sodium: 134 mmol/L — ABNORMAL LOW (ref 135–145)
Total Bilirubin: 0.5 mg/dL (ref 0.3–1.2)
Total Protein: 6.9 g/dL (ref 6.5–8.1)

## 2017-12-18 LAB — CBC WITH DIFFERENTIAL/PLATELET
Basophils Absolute: 0 10*3/uL (ref 0–0.1)
Basophils Relative: 1 %
EOS ABS: 0.1 10*3/uL (ref 0–0.7)
EOS PCT: 3 %
HCT: 32.9 % — ABNORMAL LOW (ref 35.0–47.0)
Hemoglobin: 11.5 g/dL — ABNORMAL LOW (ref 12.0–16.0)
LYMPHS ABS: 0.5 10*3/uL — AB (ref 1.0–3.6)
Lymphocytes Relative: 12 %
MCH: 30.4 pg (ref 26.0–34.0)
MCHC: 34.9 g/dL (ref 32.0–36.0)
MCV: 87.1 fL (ref 80.0–100.0)
MONO ABS: 0.5 10*3/uL (ref 0.2–0.9)
MONOS PCT: 10 %
Neutro Abs: 3.2 10*3/uL (ref 1.4–6.5)
Neutrophils Relative %: 74 %
PLATELETS: 204 10*3/uL (ref 150–440)
RBC: 3.77 MIL/uL — ABNORMAL LOW (ref 3.80–5.20)
RDW: 14 % (ref 11.5–14.5)
WBC: 4.4 10*3/uL (ref 3.6–11.0)

## 2017-12-18 MED ORDER — SODIUM CHLORIDE 0.9% FLUSH
10.0000 mL | Freq: Once | INTRAVENOUS | Status: AC
  Administered 2017-12-18: 10 mL via INTRAVENOUS
  Filled 2017-12-18: qty 10

## 2017-12-18 MED ORDER — HEPARIN SOD (PORK) LOCK FLUSH 100 UNIT/ML IV SOLN
500.0000 [IU] | Freq: Once | INTRAVENOUS | Status: AC
  Administered 2017-12-18: 500 [IU] via INTRAVENOUS

## 2017-12-18 NOTE — Progress Notes (Signed)
Here for follow up. Noted to have raised reddened areas on arms and L leg-appears to have "pus " in it. Per pt L leg area on shin improved since last week. Continues w mouth discomfort -taking MM ,and dental pain med w some effect. No noted reddened /patchy area on inner lip today-mouth feels sensitive and sore most of the time.

## 2017-12-19 LAB — THYROID PANEL WITH TSH
FREE THYROXINE INDEX: 1.2 (ref 1.2–4.9)
T3 UPTAKE RATIO: 18 % — AB (ref 24–39)
T4 TOTAL: 6.8 ug/dL (ref 4.5–12.0)
TSH: 3.39 u[IU]/mL (ref 0.450–4.500)

## 2017-12-19 MED ORDER — DOXYCYCLINE HYCLATE 100 MG PO TABS: 100.0000 mg | ORAL_TABLET | Freq: Two times a day (BID) | ORAL | 0 refills | Status: DC

## 2017-12-27 ENCOUNTER — Ambulatory Visit: Payer: Medicare Other | Admitting: Radiation Oncology

## 2018-01-16 ENCOUNTER — Encounter
Admission: RE | Admit: 2018-01-16 | Discharge: 2018-01-16 | Disposition: A | Discharge status: Home / Self Care | Payer: Medicare Other | Source: Ambulatory Visit | Attending: Oncology | Admitting: Oncology

## 2018-01-16 DIAGNOSIS — C342 Malignant neoplasm of middle lobe, bronchus or lung: Secondary | ICD-10-CM | POA: Diagnosis present

## 2018-01-16 LAB — GLUCOSE, CAPILLARY: GLUCOSE-CAPILLARY: 86 mg/dL (ref 70–99)

## 2018-01-16 MED ORDER — FLUDEOXYGLUCOSE F - 18 (FDG) INJECTION
6.7000 | Freq: Once | INTRAVENOUS | Status: AC | PRN
  Administered 2018-01-16: 7 via INTRAVENOUS

## 2018-01-17 ENCOUNTER — Other Ambulatory Visit: Payer: Self-pay

## 2018-01-17 ENCOUNTER — Ambulatory Visit
Admission: RE | Admit: 2018-01-17 | Discharge: 2018-01-17 | Disposition: A | Discharge status: Home / Self Care | Payer: Medicare Other | Source: Ambulatory Visit | Attending: Radiation Oncology | Admitting: Radiation Oncology

## 2018-01-17 VITALS — BP 125/86 | HR 89 | Temp 97.3°F | Resp 18 | Wt 132.8 lb

## 2018-01-17 DIAGNOSIS — Z923 Personal history of irradiation: Secondary | ICD-10-CM | POA: Diagnosis not present

## 2018-01-17 DIAGNOSIS — C342 Malignant neoplasm of middle lobe, bronchus or lung: Secondary | ICD-10-CM | POA: Insufficient documentation

## 2018-01-17 NOTE — Progress Notes (Signed)
Here for follow up and seeking PET scan . Coughing moreso-dry non productive" rattle " per pt

## 2018-01-17 NOTE — Progress Notes (Signed)
Radiation Oncology Follow up Note  Name: Lauren Page   Date:   01/17/2018 MRN:  [id number] DOB: 1951-01-28    This 67 y.o. female presents to the clinic today for nine-month follow-up status post salvage radiation therapy to her lung for adenocarcinoma the right middle lobe status postright middle lobectomy in 2015  REFERRING PROVIDER: [name], MD  HPI: Lauren Page is a 67 year old female now seen out 9 months having received salvage radiation therapy to her right middle lobe status post right middle lobectomy in 2015 for adenocarcinoma seen today in routine follow-up she is doing fairly well. She continues have slight cough and she's developed a slight dysphasia to pills. She specifically denies hemoptysis or chest tightness. She had a PET CT scan yesterday which I have reviewed showing excellent response to therapy with resolution of areas of hypermetabolic activity in the right internal mammary station right pleural space and right sixth rib. No new sites of new or progressive hypermetabolic disease is noted she still has a slight right pleural effusion. She has been on Tagrisso for which she's developed some dural dermatologic manifestations although continues on medication.   COMPLICATIONS OF TREATMENT: none  FOLLOW UP COMPLIANCE: keeps appointments   PHYSICAL EXAM:  BP 125/86 (BP Location: Left Arm, Patient Position: Sitting, Cuff Size: Normal)   Pulse 89   Temp (!) 97.3 F (36.3 C) (Tympanic)   Resp 18   Wt 132 lb 13.2 oz (60.2 kg)   BMI 22.80 kg/m  She does have some macular papular rashes on both hands. Lungs are clearno supraclavicular adenopathy is identified. Well-developed well-nourished patient in NAD. HEENT reveals PERLA, EOMI, discs not visualized.  Oral cavity is clear. No oral mucosal lesions are identified. Neck is clear without evidence of cervical or supraclavicular adenopathy. Lungs are clear to A&P. Cardiac examination is essentially unremarkable with regular  rate and rhythm without murmur rub or thrill. Abdomen is benign with no organomegaly or masses noted. Motor sensory and DTR levels are equal and symmetric in the upper and lower extremities. Cranial nerves II through XII are grossly intact. Proprioception is intact. No peripheral adenopathy or edema is identified. No motor or sensory levels are noted. Crude visual fields are within normal range.  RADIOLOGY RESULTS: PET CT scan is reviewed and compatible with the above-stated findings  PLAN: the present time she is doing well with excellent response to therapy at this time. She continues close follow-up care with medical oncology and continues on immunotherapy. I have asked to see her back in 6 months for follow-up. We'll be happy to reevaluate her any time should further palliative radiation therapy be indicated.  I would like to take this opportunity to thank you for allowing me to participate in the care of your patient.Noreene Filbert, MD

## 2018-01-22 ENCOUNTER — Inpatient Hospital Stay: Payer: Medicare Other | Attending: Oncology | Admitting: Oncology

## 2018-01-22 ENCOUNTER — Encounter: Payer: Self-pay | Admitting: Oncology

## 2018-01-22 ENCOUNTER — Inpatient Hospital Stay: Payer: Medicare Other

## 2018-01-22 ENCOUNTER — Other Ambulatory Visit: Payer: Self-pay

## 2018-01-22 VITALS — BP 122/89 | HR 85 | Temp 98.0°F | Resp 12 | Ht 64.0 in | Wt 132.3 lb

## 2018-01-22 DIAGNOSIS — C342 Malignant neoplasm of middle lobe, bronchus or lung: Secondary | ICD-10-CM | POA: Insufficient documentation

## 2018-01-22 DIAGNOSIS — C3490 Malignant neoplasm of unspecified part of unspecified bronchus or lung: Secondary | ICD-10-CM

## 2018-01-22 DIAGNOSIS — J910 Malignant pleural effusion: Secondary | ICD-10-CM | POA: Diagnosis not present

## 2018-01-22 DIAGNOSIS — Z79899 Other long term (current) drug therapy: Secondary | ICD-10-CM | POA: Insufficient documentation

## 2018-01-22 DIAGNOSIS — C801 Malignant (primary) neoplasm, unspecified: Secondary | ICD-10-CM

## 2018-01-22 LAB — CBC WITH DIFFERENTIAL/PLATELET
Basophils Absolute: 0 10*3/uL (ref 0–0.1)
Basophils Relative: 0 %
Eosinophils Absolute: 0.1 10*3/uL (ref 0–0.7)
Eosinophils Relative: 3 %
HCT: 32.9 % — ABNORMAL LOW (ref 35.0–47.0)
Hemoglobin: 11.2 g/dL — ABNORMAL LOW (ref 12.0–16.0)
LYMPHS ABS: 0.5 10*3/uL — AB (ref 1.0–3.6)
Lymphocytes Relative: 11 %
MCH: 30.1 pg (ref 26.0–34.0)
MCHC: 34.1 g/dL (ref 32.0–36.0)
MCV: 88.2 fL (ref 80.0–100.0)
Monocytes Absolute: 0.4 10*3/uL (ref 0.2–0.9)
Monocytes Relative: 7 %
Neutro Abs: 3.8 10*3/uL (ref 1.4–6.5)
Neutrophils Relative %: 79 %
Platelets: 224 10*3/uL (ref 150–440)
RBC: 3.73 MIL/uL — ABNORMAL LOW (ref 3.80–5.20)
RDW: 14.1 % (ref 11.5–14.5)
WBC: 4.8 10*3/uL (ref 3.6–11.0)

## 2018-01-22 LAB — COMPREHENSIVE METABOLIC PANEL
ALT: 23 U/L (ref 0–44)
ANION GAP: 8 (ref 5–15)
AST: 31 U/L (ref 15–41)
Albumin: 3.4 g/dL — ABNORMAL LOW (ref 3.5–5.0)
Alkaline Phosphatase: 67 U/L (ref 38–126)
BILIRUBIN TOTAL: 0.5 mg/dL (ref 0.3–1.2)
BUN: 20 mg/dL (ref 8–23)
CO2: 22 mmol/L (ref 22–32)
Calcium: 8.7 mg/dL — ABNORMAL LOW (ref 8.9–10.3)
Chloride: 99 mmol/L (ref 98–111)
Creatinine, Ser: 1.08 mg/dL — ABNORMAL HIGH (ref 0.44–1.00)
GFR calc Af Amer: 60 mL/min — ABNORMAL LOW (ref >60)
GFR calc non Af Amer: 52 mL/min — ABNORMAL LOW (ref >60)
Glucose, Bld: 105 mg/dL — ABNORMAL HIGH (ref 70–99)
Potassium: 3.8 mmol/L (ref 3.5–5.1)
SODIUM: 129 mmol/L — AB (ref 135–145)
TOTAL PROTEIN: 6.7 g/dL (ref 6.5–8.1)

## 2018-01-22 MED ORDER — HEPARIN SOD (PORK) LOCK FLUSH 100 UNIT/ML IV SOLN
500.0000 [IU] | Freq: Once | INTRAVENOUS | Status: AC
  Administered 2018-01-22: 500 [IU] via INTRAVENOUS

## 2018-01-22 MED ORDER — SODIUM CHLORIDE 0.9% FLUSH
10.0000 mL | INTRAVENOUS | Status: AC | PRN
  Administered 2018-01-22: 10 mL via INTRAVENOUS
  Filled 2018-01-22: qty 10

## 2018-01-22 MED ORDER — PROCHLORPERAZINE MALEATE 10 MG PO TABS
10.0000 mg | ORAL_TABLET | Freq: Four times a day (QID) | ORAL | 1 refills | Status: DC | PRN
Start: 2018-01-22 — End: 2018-07-24

## 2018-01-22 NOTE — Progress Notes (Signed)
Patient here for follow up. Patient having diarrhea.Lauren Page

## 2018-01-22 NOTE — Progress Notes (Signed)
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  INTERVAL HISTORY: Patient returns to clinic today for further evaluation and discussion of her imaging results.  She continues to have diarrhea, but is better controlled with Lomotil.  She does not complain of mouth sores or pain today.  She continues to have intermittent nausea.  She otherwise feels well.  She also continues to be highly anxious. She has no neurologic complaints.  She denies any recent fevers or illnesses.  She has a good appetite and denies weight loss.  She has no chest pain or hemoptysis.  She denies any vomiting, constipation, or diarrhea.  She has no urinary complaints.  Patient offers no further specific complaints today.  REVIEW OF SYSTEMS:   Review of Systems  Constitutional: Positive for malaise/fatigue. Negative for fever and weight loss.  Respiratory: Negative.  Negative for cough, hemoptysis and shortness of breath.   Cardiovascular: Negative.  Negative for chest pain and leg swelling.  Gastrointestinal: Positive for diarrhea and nausea. Negative for abdominal pain and constipation.  Genitourinary: Negative.  Negative for dysuria and flank pain.  Musculoskeletal: Negative for back pain.  Skin: Negative.  Negative for rash.  Neurological: Negative.  Negative for sensory change, focal weakness, weakness and headaches.  Psychiatric/Behavioral: The patient is nervous/anxious.     As per HPI. Otherwise, a complete review of systems is negative.  PAST MEDICAL HISTORY: Past Medical History:  Diagnosis Date  . Acid reflux   . Anemia    HAD TO HAVE IRON INFUSIONS BACK IN 2015  . Arthritis    bil hands and back  . Asthma   . Cancer of right lung ([place]) 2015   lung cancer -  chemo, Dr Genevive Bi removed RML Lobectomy.   Marland Kitchen Dyspnea    WITH EXERTION  . Hypertension   . Mitral valve prolapse   . Personal history of chemotherapy    F/U Lung Cancer  . Personal history of radiation therapy    Lung cancer  . Primary cancer of right middle lobe of lung ([place]) 03/11/2016    PAST SURGICAL HISTORY: Past Surgical History:  Procedure Laterality Date  . ABDOMINAL HYSTERECTOMY    . BREAST EXCISIONAL BIOPSY Left 1994 (-), 2004 (-)   benign  . COLONOSCOPY  2008    Dr. Donnella Sham  . KNEE SURGERY  2007  . LUNG LOBECTOMY Right    middle lobe  . PORTACATH PLACEMENT Right 02/09/2017   Procedure: INSERTION PORT-A-CATH;  Surgeon: Nestor Lewandowsky, MD;  Location: [facility] ORS;  Service: General;  Laterality: Right;  . SALIVARY GLAND SURGERY Left 1983   with lymph node removal also  . UPPER GI ENDOSCOPY  2008  . VIDEO BRONCHOSCOPY WITH ENDOBRONCHIAL ULTRASOUND Right 01/24/2017   Procedure: VIDEO BRONCHOSCOPY WITH ENDOBRONCHIAL ULTRASOUND;  Surgeon: [name], MD;  Location: [facility] ORS;  Service: Pulmonary;  Laterality: Right;    FAMILY HISTORY: Family History  Problem Relation Age of Onset  . Breast cancer Maternal Aunt   . Breast cancer Paternal Aunt   . Breast cancer Maternal Grandmother   . Breast cancer Maternal Aunt   . Breast cancer Paternal Aunt   . Healthy Mother   . Healthy Father     ADVANCED DIRECTIVES (Y/N):  N  HEALTH MAINTENANCE: Social History  Tobacco Use  . Smoking status: Never Smoker  . Smokeless tobacco: Never Used  Substance Use Topics  . Alcohol use: Yes    Alcohol/week: 4.2 oz    Types: 7 Glasses of wine per week  . Drug use: No     Colonoscopy:  PAP:  Bone density:  Lipid panel:  Allergies  Allergen Reactions  . Penicillins Swelling and Rash    Has patient had a PCN reaction causing immediate rash, facial/tongue/throat swelling, SOB or lightheadedness with hypotension: Unknown Has patient had a PCN reaction causing severe rash  involving mucus membranes or skin necrosis: Unknown Has patient had a PCN reaction that required hospitalization: No Has patient had a PCN reaction occurring within the last 10 years: No If all of the above answers are "NO", then may proceed with Cephalosporin use.    . Sulfa Antibiotics Nausea And Vomiting    Current Outpatient Medications  Medication Sig Dispense Refill  . albuterol (PROVENTIL HFA;VENTOLIN HFA) 108 (90 Base) MCG/ACT inhaler Inhale 1-2 puffs into the lungs every 6 (six) hours as needed for wheezing or shortness of breath.    . B Complex Vitamins (VITAMIN B COMPLEX PO) Take 1 tablet by mouth daily.     . Biotin w/ Vitamins C & E (HAIR/SKIN/NAILS PO) Take 1 tablet by mouth daily.    . Calcium Carb-Cholecalciferol (CALCIUM 600+D) 600-800 MG-UNIT TABS Take 2 tablets by mouth daily.    . Cholecalciferol (D3 MAXIMUM STRENGTH) 5000 units capsule Take 5,000 Units by mouth daily.    . COLLAGEN PO Take 1,000 mg by mouth daily.    . cyclobenzaprine (FLEXERIL) 5 MG tablet Take 1 tablet (5 mg total) by mouth 3 (three) times daily as needed for muscle spasms. 30 tablet 1  . fexofenadine (ALLEGRA) 180 MG tablet Take 180 mg by mouth daily as needed for allergies.     . fluticasone (FLONASE) 50 MCG/ACT nasal spray Place 2 sprays into both nostrils daily as needed for allergies or rhinitis.    . Fluticasone-Salmeterol (ADVAIR) 250-50 MCG/DOSE AEPB Inhale 1 puff into the lungs 2 (two) times daily as needed (for respiratory issues.).    Marland Kitchen lansoprazole (PREVACID) 30 MG capsule Take 30 mg by mouth 2 (two) times daily before a meal.     . lidocaine-prilocaine (EMLA) cream Apply 1 application topically as needed. Apply generously over the Mediport 45 minutes prior to chemotherapy. 30 g 0  . losartan (COZAAR) 25 MG tablet Take 25 mg by mouth at bedtime.     Marland Kitchen losartan (COZAAR) 25 MG tablet Take 25 mg by mouth daily.     . metoCLOPramide (REGLAN) 10 MG tablet Take 1 tablet (10 mg total) by mouth  every 8 (eight) hours as needed for nausea. 40 tablet 3  . mupirocin ointment (BACTROBAN) 2 %   0  . ondansetron (ZOFRAN) 8 MG tablet Take 1 tablet (8 mg total) by mouth every 8 (eight) hours as needed for nausea or vomiting (start 3 days; after chemo). 40 tablet 1  . osimertinib mesylate (TAGRISSO) 80 MG tablet Take 1 tablet (80 mg total) by mouth daily. 30 tablet 5  . polyethylene glycol (MIRALAX / GLYCOLAX) packet Take 17 g by mouth daily as needed for mild constipation.    Marland Kitchen PREMARIN 1.25 MG tablet   1  . prochlorperazine (COMPAZINE) 10 MG tablet Take 1 tablet (10 mg total) by mouth every 6 (six) hours as needed for nausea or vomiting. 40 tablet 1  . pseudoephedrine-guaifenesin (MUCINEX  D) 60-600 MG 12 hr tablet Take 1 tablet by mouth every 12 (twelve) hours.    . triamcinolone cream (KENALOG) 0.1 % Apply 1 application topically 2 (two) times daily. 80 g 0   No current facility-administered medications for this visit.    Facility-Administered Medications Ordered in Other Visits  Medication Dose Route Frequency Provider Last Rate Last Dose  . heparin lock flush 100 unit/mL  500 Units Intravenous Once Lloyd Huger, MD      . sodium chloride flush (NS) 0.9 % injection 10 mL  10 mL Intravenous PRN Lloyd Huger, MD      . sodium chloride flush (NS) 0.9 % injection 10 mL  10 mL Intravenous PRN Lloyd Huger, MD   10 mL at 01/22/18 1419    OBJECTIVE: Vitals:   01/22/18 1426 01/22/18 1432  BP:  122/89  Pulse:  85  Resp: 12   Temp:  98 F (36.7 C)     Body mass index is 22.71 kg/m.    ECOG FS:0 - Asymptomatic  General: Well-developed, well-nourished, no acute distress. Eyes: Pink conjunctiva, anicteric sclera. HEENT: Normocephalic, moist mucous membranes, clear oropharnyx. Lungs: Clear to auscultation bilaterally. Heart: Regular rate and rhythm. No rubs, murmurs, or gallops. Abdomen: Soft, nontender, nondistended. No organomegaly noted, normoactive bowel  sounds. Musculoskeletal: No edema, cyanosis, or clubbing. Neuro: Alert, answering all questions appropriately. Cranial nerves grossly intact. Skin: No rashes or petechiae noted. Psych: Normal affect. Lymphatics: No cervical, calvicular, axillary or inguinal LAD.  LAB RESULTS:  Lab Results  Component Value Date   NA 129 (L) 01/22/2018   K 3.8 01/22/2018   CL 99 01/22/2018   CO2 22 01/22/2018   GLUCOSE 105 (H) 01/22/2018   BUN 20 01/22/2018   CREATININE 1.08 (H) 01/22/2018   CALCIUM 8.7 (L) 01/22/2018   PROT 6.7 01/22/2018   ALBUMIN 3.4 (L) 01/22/2018   AST 31 01/22/2018   ALT 23 01/22/2018   ALKPHOS 67 01/22/2018   BILITOT 0.5 01/22/2018   GFRNONAA 52 (L) 01/22/2018   GFRAA 60 (L) 01/22/2018    Lab Results  Component Value Date   WBC 4.8 01/22/2018   NEUTROABS 3.8 01/22/2018   HGB 11.2 (L) 01/22/2018   HCT 32.9 (L) 01/22/2018   MCV 88.2 01/22/2018   PLT 224 01/22/2018     STUDIES: Nm Pet Image Restag (ps) Skull Base To Thigh  Result Date: 01/16/2018 CLINICAL DATA:  Subsequent treatment strategy for right middle lobe lung cancer. EXAM: NUCLEAR MEDICINE PET SKULL BASE TO THIGH TECHNIQUE: 7.0 mCi F-18 FDG was injected intravenously. Full-ring PET imaging was performed from the skull base to thigh after the radiotracer. CT data was obtained and used for attenuation correction and anatomic localization. Fasting blood glucose: 86 mg/dl COMPARISON:  10/17/2017 FINDINGS: Mediastinal blood pool activity: SUV max 1.9 NECK: No areas of abnormal hypermetabolism. Incidental CT findings: No cervical adenopathy. CHEST: Redemonstration of right upper lobe/paramediastinal radiation fibrosis. Low-level hypermetabolism within, similar. No focal abnormalities to suggest hypermetabolic residual or recurrent disease. The previously described focal right pleural hypermetabolism has resolved. Small right pleural effusion is similar. Right internal mammary hypermetabolism has resolved. Incidental CT  findings: 4 mm posterior right upper lobe pulmonary nodule is unchanged on 75/3 and below PET resolution. Port-A-Cath from right IJ approach terminates at the low SVC. ABDOMEN/PELVIS: No abdominopelvic parenchymal or nodal hypermetabolism. Incidental CT findings: Normal adrenal glands. Aortic atherosclerosis. Hysterectomy. SKELETON: Previously described right anterior sixth rib hypermetabolism has resolved. Incidental CT findings: Defect  within the fifth left anterolateral right rib is felt to be postoperative and is not significantly changed. IMPRESSION: 1. Response to therapy. Resolution of areas of hypermetabolism within the right internal mammary station, right pleural space, and right sixth rib. No sites of new or progressive hypermetabolic disease. 2. Small right pleural effusion, similar. 3.  Aortic Atherosclerosis (ICD10-I70.0). 4. Right upper lobe pulmonary nodule is unchanged and below PET resolution. Electronically Signed   By: Abigail Miyamoto M.D.   On: 01/16/2018 16:54    ONCOLOGY HISTORY: Patient initially underwent resection in May 2015 and was noted to have the EGFR mutation.  She initially received 4 cycles of adjuvant cisplatin and Alimta completing on January 23, 2014.  Patient was on the [facility] protocol for maintenance Tarceva versus placebo, but discontinued treatment in November 2015 because of significant side effects.  Although blinded, presumption was she was receiving Tarceva.  She recently was noted to have a recurrence confirmed by right paratracheal lymph node biopsy on January 24, 2017.  PET scan on February 03, 2017 did not reveal any distant metastasis.  She underwent treatment with weekly carboplatinum and Taxol along with daily XRT finishing in October 2018.  She initiated maintenance durvalumab on June 29, 2017.  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  Patient started on Tagrisso in April 2019.   ASSESSMENT: Recurrent  stage IV adenocarcinoma of the lung.  EGFR mutation positive.  PLAN:   1. 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  Pleural fluid from thoracentesis with malignant cells consistent with her known lung cancer.  Immunotherapy has been discontinued.  PET scan results from January 16, 2018 reviewed independently and report as above with significant improvement of patient's disease burden.  Despite patient's mild side effects of diarrhea, nausea, and fatigue she wishes to continue with 80 mg Tagrisso for the time being.  We discussed possibly dose reducing her to 40 mg, but patient does not wish to pursue this at this time.  No further interventions are needed.  Return to clinic in 4 weeks with repeat laboratory work and further evaluation.  2.  Diarrhea: Continue OTC treatments as needed. 3.  Cough: Chronic and unchanged.  Monitor. 4.  Flank pain: Patient does not complain of this today.  Chronic and unchanged.  Monitor. 5.  Rash/folliculitis: Resolved.   6.  Mouth pain: Patient does not complain of this today.  Continue Magic mouthwash as needed.    I spent a total of 30 minutes face-to-face with the patient of which greater than 50% of the visit was spent in counseling and coordination of care as detailed above.   Patient expressed understanding and was in agreement with this plan. She also understands that She can call clinic at any time with any questions, concerns, or complaints.   Cancer Staging No matching staging information was found for the patient.  Lloyd Huger, MD   01/23/2018 4:21 PM

## 2018-01-23 LAB — THYROID PANEL WITH TSH
FREE THYROXINE INDEX: 1.1 — AB (ref 1.2–4.9)
T3 UPTAKE RATIO: 18 % — AB (ref 24–39)
T4 TOTAL: 6.1 ug/dL (ref 4.5–12.0)
TSH: 4.28 u[IU]/mL (ref 0.450–4.500)

## 2018-02-02 ENCOUNTER — Ambulatory Visit: Payer: Medicare Other

## 2018-02-06 ENCOUNTER — Other Ambulatory Visit: Payer: Medicare Other

## 2018-02-06 ENCOUNTER — Ambulatory Visit: Payer: Medicare Other | Admitting: Oncology

## 2018-02-25 NOTE — Progress Notes (Signed)
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  INTERVAL HISTORY: Patient returns to clinic today for repeat laboratory work, further evaluation, and continued assessment of her toleration of Tagrisso.  She continues to have diarrhea, but is better controlled with Lomotil.  She has chronic weakness and fatigue.  She otherwise feels well.  She also continues to be highly anxious. She has no neurologic complaints.  She denies any recent fevers or illnesses.  She has a good appetite and denies weight loss.  She has no chest pain or hemoptysis.  She denies any nausea, vomiting, constipation, or diarrhea.  She has no urinary complaints.  Patient offers no further specific complaints today.  REVIEW OF SYSTEMS:   Review of Systems  Constitutional: Positive for malaise/fatigue. Negative for fever and weight loss.  Respiratory: Negative.  Negative for cough, hemoptysis and shortness of breath.   Cardiovascular: Negative.  Negative for chest pain and leg swelling.  Gastrointestinal: Positive for diarrhea. Negative for abdominal pain, constipation and nausea.  Genitourinary: Negative.  Negative for dysuria and flank pain.  Musculoskeletal: Negative for back pain.  Skin: Negative.  Negative for rash.  Neurological: Negative.  Negative for sensory change, focal weakness, weakness and headaches.  Psychiatric/Behavioral: The patient is nervous/anxious.     As per HPI. Otherwise, a complete review of systems is negative.  PAST MEDICAL HISTORY: Past Medical History:  Diagnosis Date  . Acid reflux   . Anemia    HAD TO HAVE IRON INFUSIONS BACK IN 2015  . Arthritis    bil hands and back  . Asthma   . Cancer of right lung ([place]) 2015   lung cancer - chemo, Dr Genevive Bi  removed RML Lobectomy.   Marland Kitchen Dyspnea    WITH EXERTION  . Hypertension   . Mitral valve prolapse   . Personal history of chemotherapy    F/U Lung Cancer  . Personal history of radiation therapy    Lung cancer  . Primary cancer of right middle lobe of lung ([name]) 03/11/2016    PAST SURGICAL HISTORY: Past Surgical History:  Procedure Laterality Date  . ABDOMINAL HYSTERECTOMY    . BREAST EXCISIONAL BIOPSY Left 1994 (-), 2004 (-)   benign  . COLONOSCOPY  2008    Dr. Donnella Sham  . KNEE SURGERY  2007  . LUNG LOBECTOMY Right    middle lobe  . PORTACATH PLACEMENT Right 02/09/2017   Procedure: INSERTION PORT-A-CATH;  Surgeon: Nestor Lewandowsky, MD;  Location: [facility] ORS;  Service: General;  Laterality: Right;  . SALIVARY GLAND SURGERY Left 1983   with lymph node removal also  . UPPER GI ENDOSCOPY  2008  . VIDEO BRONCHOSCOPY WITH ENDOBRONCHIAL ULTRASOUND Right 01/24/2017   Procedure: VIDEO BRONCHOSCOPY WITH ENDOBRONCHIAL ULTRASOUND;  Surgeon: [name], MD;  Location: [facility] ORS;  Service: Pulmonary;  Laterality: Right;    FAMILY HISTORY: Family History  Problem Relation Age of Onset  . Breast cancer Maternal Aunt   . Breast cancer Paternal Aunt   . Breast cancer Maternal Grandmother   . Breast cancer Maternal Aunt   . Breast cancer Paternal Aunt   . Healthy Mother   . Healthy Father     ADVANCED DIRECTIVES (Y/N):  N  HEALTH MAINTENANCE: Social History   Tobacco Use  . Smoking status:  Never Smoker  . Smokeless tobacco: Never Used  Substance Use Topics  . Alcohol use: Yes    Alcohol/week: 7.0 standard drinks    Types: 7 Glasses of wine per week  . Drug use: No     Colonoscopy:  PAP:  Bone density:  Lipid panel:  Allergies  Allergen Reactions  . Penicillins Swelling and Rash    Has patient had a PCN reaction causing immediate rash, facial/tongue/throat swelling, SOB or lightheadedness with hypotension: Unknown Has patient had a PCN reaction causing severe rash  involving mucus membranes or skin necrosis: Unknown Has patient had a PCN reaction that required hospitalization: No Has patient had a PCN reaction occurring within the last 10 years: No If all of the above answers are "NO", then may proceed with Cephalosporin use.    . Sulfa Antibiotics Nausea And Vomiting    Current Outpatient Medications  Medication Sig Dispense Refill  . albuterol (PROVENTIL HFA;VENTOLIN HFA) 108 (90 Base) MCG/ACT inhaler Inhale 1-2 puffs into the lungs every 6 (six) hours as needed for wheezing or shortness of breath.    . B Complex Vitamins (VITAMIN B COMPLEX PO) Take 1 tablet by mouth daily.     . Biotin w/ Vitamins C & E (HAIR/SKIN/NAILS PO) Take 1 tablet by mouth daily.    . Calcium Carb-Cholecalciferol (CALCIUM 600+D) 600-800 MG-UNIT TABS Take 2 tablets by mouth daily.    . Cholecalciferol (D3 MAXIMUM STRENGTH) 5000 units capsule Take 5,000 Units by mouth daily.    . COLLAGEN PO Take 1,000 mg by mouth daily.    . cyclobenzaprine (FLEXERIL) 5 MG tablet Take 1 tablet (5 mg total) by mouth 3 (three) times daily as needed for muscle spasms. 30 tablet 1  . fexofenadine (ALLEGRA) 180 MG tablet Take 180 mg by mouth daily as needed for allergies.     . fluticasone (FLONASE) 50 MCG/ACT nasal spray Place 2 sprays into both nostrils daily as needed for allergies or rhinitis.    . Fluticasone-Salmeterol (ADVAIR) 250-50 MCG/DOSE AEPB Inhale 1 puff into the lungs 2 (two) times daily as needed (for respiratory issues.).    Marland Kitchen lansoprazole (PREVACID) 30 MG capsule Take 30 mg by mouth 2 (two) times daily before a meal.     . lidocaine-prilocaine (EMLA) cream Apply 1 application topically as needed. Apply generously over the Mediport 45 minutes prior to chemotherapy. 30 g 0  . losartan (COZAAR) 25 MG tablet Take 25 mg by mouth daily.     . metoCLOPramide (REGLAN) 10 MG tablet Take 1 tablet (10 mg total) by mouth every 8 (eight) hours as needed for nausea. 40 tablet 3  . mupirocin  ointment (BACTROBAN) 2 %   0  . ondansetron (ZOFRAN) 8 MG tablet Take 1 tablet (8 mg total) by mouth every 8 (eight) hours as needed for nausea or vomiting (start 3 days; after chemo). 40 tablet 1  . osimertinib mesylate (TAGRISSO) 80 MG tablet Take 1 tablet (80 mg total) by mouth daily. 30 tablet 5  . polyethylene glycol (MIRALAX / GLYCOLAX) packet Take 17 g by mouth daily as needed for mild constipation.    Marland Kitchen PREMARIN 1.25 MG tablet   1  . prochlorperazine (COMPAZINE) 10 MG tablet Take 1 tablet (10 mg total) by mouth every 6 (six) hours as needed for nausea or vomiting. 40 tablet 1  . pseudoephedrine-guaifenesin (MUCINEX D) 60-600 MG 12 hr tablet Take 1 tablet by mouth every 12 (twelve) hours.    . triamcinolone cream (KENALOG)  0.1 % Apply 1 application topically 2 (two) times daily. 80 g 0  . losartan (COZAAR) 25 MG tablet Take 25 mg by mouth at bedtime.      No current facility-administered medications for this visit.    Facility-Administered Medications Ordered in Other Visits  Medication Dose Route Frequency Provider Last Rate Last Dose  . heparin lock flush 100 unit/mL  500 Units Intravenous Once Lloyd Huger, MD      . sodium chloride flush (NS) 0.9 % injection 10 mL  10 mL Intravenous PRN Lloyd Huger, MD      . sodium chloride flush (NS) 0.9 % injection 10 mL  10 mL Intravenous PRN Lloyd Huger, MD   10 mL at 01/22/18 1419    OBJECTIVE: Vitals:   02/27/18 1431  BP: 122/90  Pulse: 80  Resp: 16  Temp: (!) 97.1 F (36.2 C)  SpO2: 99%     Body mass index is 23.17 kg/m.    ECOG FS:0 - Asymptomatic  General: Well-developed, well-nourished, no acute distress. Eyes: Pink conjunctiva, anicteric sclera. HEENT: Normocephalic, moist mucous membranes, clear oropharnyx. Lungs: Clear to auscultation bilaterally. Heart: Regular rate and rhythm. No rubs, murmurs, or gallops. Abdomen: Soft, nontender, nondistended. No organomegaly noted, normoactive bowel  sounds. Musculoskeletal: No edema, cyanosis, or clubbing. Neuro: Alert, answering all questions appropriately. Cranial nerves grossly intact. Skin: No rashes or petechiae noted. Psych: Normal affect.  LAB RESULTS:  Lab Results  Component Value Date   NA 135 02/27/2018   K 4.1 02/27/2018   CL 103 02/27/2018   CO2 23 02/27/2018   GLUCOSE 91 02/27/2018   BUN 16 02/27/2018   CREATININE 0.90 02/27/2018   CALCIUM 9.0 02/27/2018   PROT 7.0 02/27/2018   ALBUMIN 3.7 02/27/2018   AST 33 02/27/2018   ALT 27 02/27/2018   ALKPHOS 55 02/27/2018   BILITOT 0.4 02/27/2018   GFRNONAA >60 02/27/2018   GFRAA >60 02/27/2018    Lab Results  Component Value Date   WBC 3.8 02/27/2018   NEUTROABS 2.8 02/27/2018   HGB 11.3 (L) 02/27/2018   HCT 33.3 (L) 02/27/2018   MCV 90.3 02/27/2018   PLT 234 02/27/2018     STUDIES: No results found.  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  She initially received 4 cycles of adjuvant cisplatin and Alimta completing on January 23, 2014.  Patient was on the [facility] protocol for maintenance Tarceva versus placebo, but discontinued treatment in November 2015 because of significant side effects.  Although blinded, presumption was she was receiving Tarceva.  She recently was noted to have a recurrence confirmed by right paratracheal lymph node biopsy on January 24, 2017.  PET scan on February 03, 2017 did not reveal any distant metastasis.  She underwent treatment with weekly carboplatinum and Taxol along with daily XRT finishing in October 2018.  She initiated maintenance durvalumab on June 29, 2017.  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  Patient started on Tagrisso in April 2019.   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  PLAN:   1. 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  Pleural fluid from thoracentesis with malignant cells consistent with her known lung cancer.  Immunotherapy has been discontinued.  Patient initiated Tagrisso in April 2019.  PET scan results from January 16, 2018 reviewed  independently with significant improvement of patient's disease burden.  Despite patient's mild side effects of diarrhea and fatigue she wishes to continue with 80 mg Tagrisso.  Previously we discussed possibly dose reducing her to 40 mg, but patient does not wish to pursue this at this time.  No further interventions are needed.  Return to clinic in 4 weeks with repeat laboratory work and then in 8 weeks with repeat laboratory work, imaging, and further evaluation. 2.  Diarrhea: Chronic and essentially unchanged.  Continue OTC treatments as needed. 3.  Cough: Patient does not complain of this today.  Monitor. 4.  Mouth pain: Patient does not complain of this today.  Continue Magic mouthwash as needed.    I spent a total of 30 minutes face-to-face with the patient of which greater than 50% of the visit was spent in counseling and coordination of care as detailed above.    Patient expressed understanding and was in agreement with this plan. She also understands that She can call clinic at any time with any questions, concerns, or complaints.   Cancer Staging No matching staging information was found for the patient.  Lloyd Huger, MD   03/03/2018 7:13 AM

## 2018-02-27 ENCOUNTER — Inpatient Hospital Stay: Payer: Medicare Other | Attending: Oncology

## 2018-02-27 ENCOUNTER — Inpatient Hospital Stay (HOSPITAL_BASED_OUTPATIENT_CLINIC_OR_DEPARTMENT_OTHER): Payer: Medicare Other | Admitting: Oncology

## 2018-02-27 VITALS — BP 122/90 | HR 80 | Temp 97.1°F | Resp 16 | Wt 135.0 lb

## 2018-02-27 DIAGNOSIS — C342 Malignant neoplasm of middle lobe, bronchus or lung: Secondary | ICD-10-CM | POA: Insufficient documentation

## 2018-02-27 DIAGNOSIS — R197 Diarrhea, unspecified: Secondary | ICD-10-CM | POA: Diagnosis not present

## 2018-02-27 DIAGNOSIS — C3490 Malignant neoplasm of unspecified part of unspecified bronchus or lung: Secondary | ICD-10-CM

## 2018-02-27 DIAGNOSIS — J910 Malignant pleural effusion: Secondary | ICD-10-CM

## 2018-02-27 DIAGNOSIS — Z95828 Presence of other vascular implants and grafts: Secondary | ICD-10-CM

## 2018-02-27 LAB — CBC WITH DIFFERENTIAL/PLATELET
BASOS PCT: 1 %
Basophils Absolute: 0 10*3/uL (ref 0–0.1)
Eosinophils Absolute: 0.2 10*3/uL (ref 0–0.7)
Eosinophils Relative: 5 %
HEMATOCRIT: 33.3 % — AB (ref 35.0–47.0)
HEMOGLOBIN: 11.3 g/dL — AB (ref 12.0–16.0)
LYMPHS ABS: 0.5 10*3/uL — AB (ref 1.0–3.6)
Lymphocytes Relative: 13 %
MCH: 30.6 pg (ref 26.0–34.0)
MCHC: 33.9 g/dL (ref 32.0–36.0)
MCV: 90.3 fL (ref 80.0–100.0)
MONOS PCT: 9 %
Monocytes Absolute: 0.3 10*3/uL (ref 0.2–0.9)
NEUTROS ABS: 2.8 10*3/uL (ref 1.4–6.5)
NEUTROS PCT: 72 %
Platelets: 234 10*3/uL (ref 150–440)
RBC: 3.68 MIL/uL — ABNORMAL LOW (ref 3.80–5.20)
RDW: 14.7 % — ABNORMAL HIGH (ref 11.5–14.5)
WBC: 3.8 10*3/uL (ref 3.6–11.0)

## 2018-02-27 LAB — COMPREHENSIVE METABOLIC PANEL
ALT: 27 U/L (ref 0–44)
ANION GAP: 9 (ref 5–15)
AST: 33 U/L (ref 15–41)
Albumin: 3.7 g/dL (ref 3.5–5.0)
Alkaline Phosphatase: 55 U/L (ref 38–126)
BUN: 16 mg/dL (ref 8–23)
CHLORIDE: 103 mmol/L (ref 98–111)
CO2: 23 mmol/L (ref 22–32)
Calcium: 9 mg/dL (ref 8.9–10.3)
Creatinine, Ser: 0.9 mg/dL (ref 0.44–1.00)
GFR calc non Af Amer: >60 mL/min (ref >60)
Glucose, Bld: 91 mg/dL (ref 70–99)
POTASSIUM: 4.1 mmol/L (ref 3.5–5.1)
SODIUM: 135 mmol/L (ref 135–145)
Total Bilirubin: 0.4 mg/dL (ref 0.3–1.2)
Total Protein: 7 g/dL (ref 6.5–8.1)

## 2018-02-27 MED ORDER — SODIUM CHLORIDE 0.9% FLUSH
10.0000 mL | Freq: Once | INTRAVENOUS | Status: AC
  Administered 2018-02-27: 10 mL via INTRAVENOUS
  Filled 2018-02-27: qty 10

## 2018-02-27 MED ORDER — HEPARIN SOD (PORK) LOCK FLUSH 100 UNIT/ML IV SOLN
500.0000 [IU] | Freq: Once | INTRAVENOUS | Status: AC
  Administered 2018-02-27: 500 [IU] via INTRAVENOUS

## 2018-03-19 ENCOUNTER — Other Ambulatory Visit: Payer: Self-pay | Admitting: Oncology

## 2018-03-19 DIAGNOSIS — C342 Malignant neoplasm of middle lobe, bronchus or lung: Secondary | ICD-10-CM

## 2018-03-19 MED ORDER — OSIMERTINIB MESYLATE 80 MG PO TABS: 80.0000 mg | ORAL_TABLET | Freq: Every day | ORAL | 5 refills | Status: DC

## 2018-03-29 ENCOUNTER — Inpatient Hospital Stay: Payer: Medicare Other | Attending: Oncology

## 2018-03-29 DIAGNOSIS — Z452 Encounter for adjustment and management of vascular access device: Secondary | ICD-10-CM | POA: Insufficient documentation

## 2018-03-29 DIAGNOSIS — C342 Malignant neoplasm of middle lobe, bronchus or lung: Secondary | ICD-10-CM | POA: Insufficient documentation

## 2018-03-29 DIAGNOSIS — C3490 Malignant neoplasm of unspecified part of unspecified bronchus or lung: Secondary | ICD-10-CM

## 2018-03-29 LAB — CBC WITH DIFFERENTIAL/PLATELET
BASOS ABS: 0 10*3/uL (ref 0–0.1)
BASOS PCT: 1 %
EOS ABS: 0.1 10*3/uL (ref 0–0.7)
EOS PCT: 3 %
HCT: 33.6 % — ABNORMAL LOW (ref 35.0–47.0)
Hemoglobin: 11.4 g/dL — ABNORMAL LOW (ref 12.0–16.0)
Lymphocytes Relative: 15 %
Lymphs Abs: 0.5 10*3/uL — ABNORMAL LOW (ref 1.0–3.6)
MCH: 30.8 pg (ref 26.0–34.0)
MCHC: 33.9 g/dL (ref 32.0–36.0)
MCV: 90.9 fL (ref 80.0–100.0)
MONO ABS: 0.2 10*3/uL (ref 0.2–0.9)
MONOS PCT: 7 %
Neutro Abs: 2.5 10*3/uL (ref 1.4–6.5)
Neutrophils Relative %: 74 %
PLATELETS: 188 10*3/uL (ref 150–440)
RBC: 3.7 MIL/uL — ABNORMAL LOW (ref 3.80–5.20)
RDW: 14.3 % (ref 11.5–14.5)
WBC: 3.4 10*3/uL — ABNORMAL LOW (ref 3.6–11.0)

## 2018-03-29 LAB — COMPREHENSIVE METABOLIC PANEL
ALBUMIN: 3.7 g/dL (ref 3.5–5.0)
ALK PHOS: 45 U/L (ref 38–126)
ALT: 27 U/L (ref 0–44)
AST: 38 U/L (ref 15–41)
Anion gap: 5 (ref 5–15)
BILIRUBIN TOTAL: 0.5 mg/dL (ref 0.3–1.2)
BUN: 13 mg/dL (ref 8–23)
CALCIUM: 8.7 mg/dL — AB (ref 8.9–10.3)
CO2: 23 mmol/L (ref 22–32)
CREATININE: 0.83 mg/dL (ref 0.44–1.00)
Chloride: 103 mmol/L (ref 98–111)
GFR calc Af Amer: >60 mL/min (ref >60)
GFR calc non Af Amer: >60 mL/min (ref >60)
GLUCOSE: 142 mg/dL — AB (ref 70–99)
Potassium: 3.6 mmol/L (ref 3.5–5.1)
Sodium: 131 mmol/L — ABNORMAL LOW (ref 135–145)
TOTAL PROTEIN: 6.8 g/dL (ref 6.5–8.1)

## 2018-03-29 MED ORDER — SODIUM CHLORIDE 0.9% FLUSH
10.0000 mL | Freq: Once | INTRAVENOUS | Status: AC
  Administered 2018-03-29: 10 mL via INTRAVENOUS
  Filled 2018-03-29: qty 10

## 2018-03-29 MED ORDER — HEPARIN SOD (PORK) LOCK FLUSH 100 UNIT/ML IV SOLN
500.0000 [IU] | Freq: Once | INTRAVENOUS | Status: AC
  Administered 2018-03-29: 500 [IU] via INTRAVENOUS

## 2018-04-17 ENCOUNTER — Ambulatory Visit
Admission: RE | Admit: 2018-04-17 | Discharge: 2018-04-17 | Disposition: A | Discharge status: Home / Self Care | Payer: Medicare Other | Source: Ambulatory Visit | Attending: Oncology | Admitting: Oncology

## 2018-04-17 DIAGNOSIS — C3490 Malignant neoplasm of unspecified part of unspecified bronchus or lung: Secondary | ICD-10-CM | POA: Insufficient documentation

## 2018-04-17 DIAGNOSIS — R911 Solitary pulmonary nodule: Secondary | ICD-10-CM | POA: Insufficient documentation

## 2018-04-17 DIAGNOSIS — I700 Atherosclerosis of aorta: Secondary | ICD-10-CM | POA: Insufficient documentation

## 2018-04-17 DIAGNOSIS — J90 Pleural effusion, not elsewhere classified: Secondary | ICD-10-CM | POA: Diagnosis not present

## 2018-04-17 LAB — GLUCOSE, CAPILLARY: GLUCOSE-CAPILLARY: 80 mg/dL (ref 70–99)

## 2018-04-17 MED ORDER — FLUDEOXYGLUCOSE F - 18 (FDG) INJECTION
7.2500 | Freq: Once | INTRAVENOUS | Status: AC | PRN
  Administered 2018-04-17: 7.25 via INTRAVENOUS

## 2018-04-22 NOTE — Progress Notes (Signed)
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  INTERVAL HISTORY: Patient returns to clinic today for further evaluation and discussion of her imaging results.  She continues to tolerate Tagrisso well but does admit to increased fatigue.  She has occasional diarrhea but this is better controlled with Lomotil. She has no neurologic complaints.  She denies any recent fevers or illnesses.  She has a good appetite and denies weight loss.  She denies any chest pain, shortness of breath, cough, hemoptysis.  She denies any nausea, vomiting, constipation, or diarrhea.  She has no urinary complaints.  Patient offers no further specific complaints today.  REVIEW OF SYSTEMS:   Review of Systems  Constitutional: Positive for malaise/fatigue. Negative for fever and weight loss.  Respiratory: Negative.  Negative for cough, hemoptysis and shortness of breath.   Cardiovascular: Negative.  Negative for chest pain and leg swelling.  Gastrointestinal: Positive for diarrhea. Negative for abdominal pain, constipation and nausea.  Genitourinary: Negative.  Negative for dysuria and flank pain.  Musculoskeletal: Negative for back pain.  Skin: Negative.  Negative for rash.  Neurological: Negative.  Negative for sensory change, focal weakness, weakness and headaches.  Psychiatric/Behavioral: The patient is nervous/anxious.     As per HPI. Otherwise, a complete review of systems is negative.  PAST MEDICAL HISTORY: Past Medical History:  Diagnosis Date  . Acid reflux   . Anemia    HAD TO HAVE IRON INFUSIONS BACK IN 2015  . Arthritis    bil hands and back  . Asthma   . Cancer of right lung ([place]) 2015   lung cancer - chemo, Dr Genevive Bi removed RML Lobectomy.   Marland Kitchen Dyspnea    WITH EXERTION  . Hypertension   . Mitral valve prolapse   . Personal history of chemotherapy    F/U Lung Cancer  . Personal history of radiation therapy    Lung cancer  . Primary cancer of right middle lobe of lung ([place]) 03/11/2016    PAST SURGICAL HISTORY: Past Surgical History:  Procedure Laterality Date  . ABDOMINAL HYSTERECTOMY    . BREAST EXCISIONAL BIOPSY Left 1994 (-), 2004 (-)   benign  . COLONOSCOPY  2008    Dr. Donnella Sham  . KNEE SURGERY  2007  . LUNG LOBECTOMY Right    middle lobe  . PORTACATH PLACEMENT Right 02/09/2017   Procedure: INSERTION PORT-A-CATH;  Surgeon: Nestor Lewandowsky, MD;  Location: [facility] ORS;  Service: General;  Laterality: Right;  . SALIVARY GLAND SURGERY Left 1983   with lymph node removal also  . UPPER GI ENDOSCOPY  2008  . VIDEO BRONCHOSCOPY WITH ENDOBRONCHIAL ULTRASOUND Right 01/24/2017   Procedure: VIDEO BRONCHOSCOPY WITH ENDOBRONCHIAL ULTRASOUND;  Surgeon: [name], MD;  Location: [facility] ORS;  Service: Pulmonary;  Laterality: Right;    FAMILY HISTORY: Family History  Problem Relation Age of Onset  . Breast cancer Maternal Aunt   . Breast cancer Paternal Aunt   . Breast cancer Maternal Grandmother   . Breast cancer Maternal Aunt   . Breast cancer Paternal Aunt   . Healthy Mother   . Healthy Father     ADVANCED DIRECTIVES (Y/N):  N  HEALTH MAINTENANCE: Social History   Tobacco Use  . Smoking status: Never Smoker  . Smokeless tobacco: Never Used  Substance  Use Topics  . Alcohol use: Yes    Alcohol/week: 7.0 standard drinks    Types: 7 Glasses of wine per week  . Drug use: No     Colonoscopy:  PAP:  Bone density:  Lipid panel:  Allergies  Allergen Reactions  . Penicillins Swelling and Rash    Has patient had a PCN reaction causing immediate rash, facial/tongue/throat swelling, SOB or lightheadedness with hypotension: Unknown Has patient had a PCN reaction causing severe rash involving mucus membranes or skin necrosis:  Unknown Has patient had a PCN reaction that required hospitalization: No Has patient had a PCN reaction occurring within the last 10 years: No If all of the above answers are "NO", then may proceed with Cephalosporin use.    . Sulfa Antibiotics Nausea And Vomiting    Current Outpatient Medications  Medication Sig Dispense Refill  . B Complex Vitamins (VITAMIN B COMPLEX PO) Take 1 tablet by mouth daily.     . Biotin w/ Vitamins C & E (HAIR/SKIN/NAILS PO) Take 1 tablet by mouth daily.    . Calcium Carb-Cholecalciferol (CALCIUM 600+D) 600-800 MG-UNIT TABS Take 2 tablets by mouth daily.    . Cholecalciferol (D3 MAXIMUM STRENGTH) 5000 units capsule Take 5,000 Units by mouth daily.    . COLLAGEN PO Take 1,000 mg by mouth daily.    . fexofenadine (ALLEGRA) 180 MG tablet Take 180 mg by mouth daily as needed for allergies.     . fluticasone (FLONASE) 50 MCG/ACT nasal spray Place 2 sprays into both nostrils daily as needed for allergies or rhinitis.    . Fluticasone-Salmeterol (ADVAIR) 250-50 MCG/DOSE AEPB Inhale 1 puff into the lungs 2 (two) times daily as needed (for respiratory issues.).    Marland Kitchen lansoprazole (PREVACID) 30 MG capsule Take 30 mg by mouth 2 (two) times daily before a meal.     . losartan (COZAAR) 25 MG tablet Take 25 mg by mouth daily.     . metoCLOPramide (REGLAN) 10 MG tablet Take 1 tablet (10 mg total) by mouth every 8 (eight) hours as needed for nausea. 40 tablet 3  . osimertinib mesylate (TAGRISSO) 80 MG tablet Take 1 tablet (80 mg total) by mouth daily. 30 tablet 5  . polyethylene glycol (MIRALAX / GLYCOLAX) packet Take 17 g by mouth daily as needed for mild constipation.    Marland Kitchen PREMARIN 1.25 MG tablet   1  . prochlorperazine (COMPAZINE) 10 MG tablet Take 1 tablet (10 mg total) by mouth every 6 (six) hours as needed for nausea or vomiting. 40 tablet 1  . Vitamin D, Ergocalciferol, (DRISDOL) 50000 units CAPS capsule Take by mouth.    Marland Kitchen albuterol (PROVENTIL HFA;VENTOLIN HFA) 108 (90  Base) MCG/ACT inhaler Inhale 1-2 puffs into the lungs every 6 (six) hours as needed for wheezing or shortness of breath.    . cyclobenzaprine (FLEXERIL) 5 MG tablet Take 1 tablet (5 mg total) by mouth 3 (three) times daily as needed for muscle spasms. (Patient not taking: Reported on 04/26/2018) 30 tablet 1  . lidocaine-prilocaine (EMLA) cream Apply 1 application topically as needed. Apply generously over the Mediport 45 minutes prior to chemotherapy. (Patient not taking: Reported on 04/26/2018) 30 g 0  . mupirocin ointment (BACTROBAN) 2 %   0  . ondansetron (ZOFRAN) 8 MG tablet Take 1 tablet (8 mg total) by mouth every 8 (eight) hours as needed for nausea or vomiting (start 3 days; after chemo). (Patient not taking: Reported on 04/26/2018) 40 tablet 1  . pseudoephedrine-guaifenesin (  MUCINEX D) 60-600 MG 12 hr tablet Take 1 tablet by mouth every 12 (twelve) hours.    . triamcinolone cream (KENALOG) 0.1 % Apply 1 application topically 2 (two) times daily. (Patient not taking: Reported on 04/26/2018) 80 g 0   No current facility-administered medications for this visit.    Facility-Administered Medications Ordered in Other Visits  Medication Dose Route Frequency Provider Last Rate Last Dose  . heparin lock flush 100 unit/mL  500 Units Intravenous Once Lloyd Huger, MD      . sodium chloride flush (NS) 0.9 % injection 10 mL  10 mL Intravenous PRN Lloyd Huger, MD      . sodium chloride flush (NS) 0.9 % injection 10 mL  10 mL Intravenous PRN Lloyd Huger, MD   10 mL at 01/22/18 1419    OBJECTIVE: Vitals:   04/26/18 1444  BP: 135/83  Pulse: 76  Resp: 18  Temp: (!) 97.2 F (36.2 C)     Body mass index is 23.07 kg/m.    ECOG FS:0 - Asymptomatic  General: Well-developed, well-nourished, no acute distress. Eyes: Pink conjunctiva, anicteric sclera. HEENT: Normocephalic, moist mucous membranes. Lungs: Clear to auscultation bilaterally. Heart: Regular rate and rhythm. No rubs,  murmurs, or gallops. Abdomen: Soft, nontender, nondistended. No organomegaly noted, normoactive bowel sounds. Musculoskeletal: No edema, cyanosis, or clubbing. Neuro: Alert, answering all questions appropriately. Cranial nerves grossly intact. Skin: No rashes or petechiae noted. Psych: Normal affect.  LAB RESULTS:  Lab Results  Component Value Date   NA 135 04/26/2018   K 3.9 04/26/2018   CL 103 04/26/2018   CO2 24 04/26/2018   GLUCOSE 90 04/26/2018   BUN 13 04/26/2018   CREATININE 0.87 04/26/2018   CALCIUM 9.3 04/26/2018   PROT 7.8 04/26/2018   ALBUMIN 4.1 04/26/2018   AST 42 (H) 04/26/2018   ALT 30 04/26/2018   ALKPHOS 59 04/26/2018   BILITOT 0.7 04/26/2018   GFRNONAA >60 04/26/2018   GFRAA >60 04/26/2018    Lab Results  Component Value Date   WBC 3.6 (L) 04/26/2018   NEUTROABS 2.7 04/26/2018   HGB 12.4 04/26/2018   HCT 37.9 04/26/2018   MCV 91.1 04/26/2018   PLT 192 04/26/2018     STUDIES: Nm Pet Image Restag (ps) Skull Base To Thigh  Result Date: 04/17/2018 CLINICAL DATA:  Subsequent treatment strategy for recurrent non-small cell lung cancer. On chemotherapy. EXAM: NUCLEAR MEDICINE PET SKULL BASE TO THIGH TECHNIQUE: 7.3 mCi F-18 FDG was injected intravenously. Full-ring PET imaging was performed from the skull base to thigh after the radiotracer. CT data was obtained and used for attenuation correction and anatomic localization. Fasting blood glucose: 80 mg/dl COMPARISON:  01/16/2018 FINDINGS: Mediastinal blood pool activity: SUV max 2.3 NECK: No areas of abnormal hypermetabolism. Incidental CT findings: Question atrophy or absence of the left submandibular gland. No cervical adenopathy. CHEST: No pulmonary parenchymal or thoracic nodal hypermetabolism. Incidental CT findings: Small right pleural effusion is similar. Right Port-A-Cath terminates at low SVC. Radiation induced fibrosis in the paramediastinal right lung. 4 mm posterolateral right upper lobe pulmonary  nodule is unchanged on image 83/3. ABDOMEN/PELVIS: No abdominopelvic parenchymal or nodal hypermetabolism. Incidental CT findings: Normal adrenal glands. Abdominal aortic atherosclerosis. Hysterectomy. SKELETON: No abnormal marrow activity. Incidental CT findings: Again identified is a defect in the right lateral fifth rib. IMPRESSION: 1. No evidence of hypermetabolic residual or recurrent disease. 2. Similar right-sided pleural effusion and 4 mm right upper lobe pulmonary nodule. 3.  Aortic  Atherosclerosis (ICD10-I70.0). Electronically Signed   By: Abigail Miyamoto M.D.   On: 04/17/2018 14:58    ONCOLOGY HISTORY: Patient initially underwent resection in May 2015 and was noted to have the EGFR mutation.  She initially received 4 cycles of adjuvant cisplatin and Alimta completing on January 23, 2014.  Patient was on the [facility] protocol for maintenance Tarceva versus placebo, but discontinued treatment in November 2015 because of significant side effects.  Although blinded, presumption was she was receiving Tarceva.  She recently was noted to have a recurrence confirmed by right paratracheal lymph node biopsy on January 24, 2017.  PET scan on February 03, 2017 did not reveal any distant metastasis.  She underwent treatment with weekly carboplatinum and Taxol along with daily XRT finishing in October 2018.  She initiated maintenance durvalumab on June 29, 2017.  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  Patient started on Tagrisso in April 2019.   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  PLAN:   1. 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  Pleural fluid from thoracentesis with malignant cells consistent with her known lung cancer.  Immunotherapy has been discontinued.  Patient initiated Tagrisso in April 2019.  PET scan results from April 17, 2018 reviewed  independently and report as above with no evidence of residual or progressive disease.  We once again discussed dose reducing her Tagrisso from 80 mg to 40 mg given patient's ongoing fatigue and diarrhea, but she has declined.  Continue current dose as prescribed.  Return to clinic in 3 months with repeat laboratory work and further evaluation.  Will repeat imaging in 6 months.   2.  Diarrhea: Chronic and unchanged.  Continue Lomotil as prescribed. 3.  Cough: Patient does not complain of this today.  Monitor. 4.  Mouth pain: Patient does not complain of this today.  Continue Magic mouthwash as needed.    I spent a total of 30 minutes face-to-face with the patient of which greater than 50% of the visit was spent in counseling and coordination of care as detailed above.  Patient expressed understanding and was in agreement with this plan. She also understands that She can call clinic at any time with any questions, concerns, or complaints.   Cancer Staging No matching staging information was found for the patient.  Lloyd Huger, MD   05/01/2018 6:39 AM

## 2018-04-26 ENCOUNTER — Encounter: Payer: Self-pay | Admitting: *Deleted

## 2018-04-26 ENCOUNTER — Inpatient Hospital Stay (HOSPITAL_BASED_OUTPATIENT_CLINIC_OR_DEPARTMENT_OTHER): Payer: Medicare Other | Admitting: Oncology

## 2018-04-26 ENCOUNTER — Inpatient Hospital Stay: Payer: Medicare Other | Attending: Oncology

## 2018-04-26 ENCOUNTER — Other Ambulatory Visit: Payer: Self-pay

## 2018-04-26 VITALS — BP 135/83 | HR 76 | Temp 97.2°F | Resp 18 | Wt 134.4 lb

## 2018-04-26 DIAGNOSIS — C3490 Malignant neoplasm of unspecified part of unspecified bronchus or lung: Secondary | ICD-10-CM

## 2018-04-26 DIAGNOSIS — R197 Diarrhea, unspecified: Secondary | ICD-10-CM | POA: Insufficient documentation

## 2018-04-26 LAB — CBC WITH DIFFERENTIAL/PLATELET
ABS IMMATURE GRANULOCYTES: 0 10*3/uL (ref 0.00–0.07)
Basophils Absolute: 0 10*3/uL (ref 0.0–0.1)
Basophils Relative: 1 %
Eosinophils Absolute: 0.1 10*3/uL (ref 0.0–0.5)
Eosinophils Relative: 3 %
HEMATOCRIT: 37.9 % (ref 36.0–46.0)
HEMOGLOBIN: 12.4 g/dL (ref 12.0–15.0)
IMMATURE GRANULOCYTES: 0 %
LYMPHS ABS: 0.6 10*3/uL — AB (ref 0.7–4.0)
LYMPHS PCT: 16 %
MCH: 29.8 pg (ref 26.0–34.0)
MCHC: 32.7 g/dL (ref 30.0–36.0)
MCV: 91.1 fL (ref 80.0–100.0)
MONO ABS: 0.2 10*3/uL (ref 0.1–1.0)
MONOS PCT: 6 %
NEUTROS ABS: 2.7 10*3/uL (ref 1.7–7.7)
Neutrophils Relative %: 74 %
Platelets: 192 10*3/uL (ref 150–400)
RBC: 4.16 MIL/uL (ref 3.87–5.11)
RDW: 14 % (ref 11.5–15.5)
WBC: 3.6 10*3/uL — ABNORMAL LOW (ref 4.0–10.5)
nRBC: 0 % (ref 0.0–0.2)

## 2018-04-26 LAB — COMPREHENSIVE METABOLIC PANEL
ALBUMIN: 4.1 g/dL (ref 3.5–5.0)
ALT: 30 U/L (ref 0–44)
AST: 42 U/L — ABNORMAL HIGH (ref 15–41)
Alkaline Phosphatase: 59 U/L (ref 38–126)
Anion gap: 8 (ref 5–15)
BUN: 13 mg/dL (ref 8–23)
CALCIUM: 9.3 mg/dL (ref 8.9–10.3)
CO2: 24 mmol/L (ref 22–32)
Chloride: 103 mmol/L (ref 98–111)
Creatinine, Ser: 0.87 mg/dL (ref 0.44–1.00)
GFR calc Af Amer: >60 mL/min (ref >60)
GFR calc non Af Amer: >60 mL/min (ref >60)
GLUCOSE: 90 mg/dL (ref 70–99)
Potassium: 3.9 mmol/L (ref 3.5–5.1)
Sodium: 135 mmol/L (ref 135–145)
Total Bilirubin: 0.7 mg/dL (ref 0.3–1.2)
Total Protein: 7.8 g/dL (ref 6.5–8.1)

## 2018-04-26 NOTE — Progress Notes (Unsigned)
Met with patient while she was in clinic this afternoon to review the updated side effect profile for Erlotinib, which she received approximately 3 years ago on the [facility] [id number] study. Patient was informed of the addition of "Sores in the eye; Blisters on the skin" and she immediately reported that she developed a sore in the lower part of her right eye about 6 months after stopping the Erlotinib. A few weeks after that, she also developed a "blistery" area underneath her left eye, and these areas are still problems for her, but she reports they are manageable. States she has seen Dr. Porfilio, an opthalmologist about the sore in her right eye and was told that it could be removed, but that it might actually cause her more problems that it does currently. Ms. Creary was also informed of the decrease in risk attribution for Vomiting, Tiredness and Loss of appetite to "occasional" from "common" in occurrence, and the statement clarification for "A tear or hole in internal organs that may require surgery which may cause blurred vision (under Rare)" was reviewed as well. Ms. Blatchley denied having questions about these updates and declined when asked whether she wanted copies of the changes/updated information. Dr. Finnegan was present and was also made aware of the updates. Kaye , BSN, MHA, OCN 04/26/2018 3:20 PM  

## 2018-04-26 NOTE — Progress Notes (Signed)
Here for follow up. Stated she still has mouth ulcers, finger nails loose and slitting by cutical area -feeling tired

## 2018-05-21 ENCOUNTER — Ambulatory Visit (INDEPENDENT_AMBULATORY_CARE_PROVIDER_SITE_OTHER): Payer: Medicare Other | Admitting: Internal Medicine

## 2018-05-21 ENCOUNTER — Encounter: Payer: Self-pay | Admitting: Internal Medicine

## 2018-05-21 ENCOUNTER — Inpatient Hospital Stay: Payer: Medicare Other | Attending: Oncology

## 2018-05-21 VITALS — BP 132/90 | HR 78 | Ht 64.0 in | Wt 137.6 lb

## 2018-05-21 DIAGNOSIS — Z95828 Presence of other vascular implants and grafts: Secondary | ICD-10-CM

## 2018-05-21 DIAGNOSIS — C3492 Malignant neoplasm of unspecified part of left bronchus or lung: Secondary | ICD-10-CM | POA: Diagnosis not present

## 2018-05-21 DIAGNOSIS — C342 Malignant neoplasm of middle lobe, bronchus or lung: Secondary | ICD-10-CM | POA: Diagnosis not present

## 2018-05-21 DIAGNOSIS — Z452 Encounter for adjustment and management of vascular access device: Secondary | ICD-10-CM | POA: Diagnosis not present

## 2018-05-21 DIAGNOSIS — J449 Chronic obstructive pulmonary disease, unspecified: Secondary | ICD-10-CM | POA: Diagnosis not present

## 2018-05-21 MED ORDER — HEPARIN SOD (PORK) LOCK FLUSH 100 UNIT/ML IV SOLN
INTRAVENOUS | Status: AC
Start: 2018-05-21 — End: ?
  Filled 2018-05-21: qty 5

## 2018-05-21 MED ORDER — HEPARIN SOD (PORK) LOCK FLUSH 100 UNIT/ML IV SOLN
500.0000 [IU] | Freq: Once | INTRAVENOUS | Status: AC
  Administered 2018-05-21: 500 [IU] via INTRAVENOUS

## 2018-05-21 MED ORDER — SODIUM CHLORIDE 0.9% FLUSH
10.0000 mL | INTRAVENOUS | Status: DC | PRN
  Administered 2018-05-21: 10 mL via INTRAVENOUS
  Filled 2018-05-21: qty 10

## 2018-05-21 MED ORDER — LEVOFLOXACIN 500 MG PO TABS: 500.0000 mg | ORAL_TABLET | Freq: Every day | ORAL | 0 refills | Status: AC

## 2018-05-21 NOTE — Patient Instructions (Addendum)
START USING INHALERS REGULARLY  Follow up with Oncology in January 2020

## 2018-05-21 NOTE — Progress Notes (Signed)
[facility] Consultation    Update: Pt seen and evaluated before pt going for procedure, new changes in exam have been noted.     Date: 05/21/2018  MRN# [id number] Lauren Page [birth year]    Lauren Page is a 67 y.o. old female seen in consultation for chief complaint of:    CC follow up Abnormal CT chest  PREVIOUS HPI 67 year old woman who is status post right middle lobectomy several years ago. She is now about 2-1/2 years out from a right thoracotomy and right middle lobectomy for a adenocarcinoma the lung with pleural invasion, she then completed 4 cycles of chemotherapy on January 23, 2014   About a year ago she did have a small nodule identified along the superior segment right lower lobe close to the area of the fissure, she was found to have a nodular density in the superior segment of the right lower lobe. A subsequent PET scan was performed which revealed no evidence of uptake in the hypermetabolic range.   A repeat CT scan July 2018 There are some bilateral hilar adenopathy which is increased slightly in size.  The largest lymph node now measures over 1.5 cm in the right hilum. The left hilum has a 9 mm lymph node. These have increased from before.   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  albuterol (PROVENTIL HFA;VENTOLIN HFA) 108 (90 Base) MCG/ACT inhaler,  Inhale 1-2 puffs into the lungs every 6 (six) hours as needed for wheezing or shortness of breath., Disp: , Rfl:  .  B Complex Vitamins (VITAMIN B COMPLEX PO), Take 1 tablet by mouth daily. , Disp: , Rfl:  .  Biotin w/ Vitamins C & E (HAIR/SKIN/NAILS PO), Take 1 tablet by mouth daily., Disp: , Rfl:  .  Calcium Carb-Cholecalciferol (CALCIUM 600+D) 600-800 MG-UNIT TABS, Take 2 tablets by mouth daily., Disp: , Rfl:  .  Cholecalciferol (D3 MAXIMUM STRENGTH) 5000 units capsule, Take 5,000 Units by mouth daily., Disp: , Rfl:  .  COLLAGEN PO, Take 1,000 mg by mouth daily., Disp: , Rfl:  .  cyclobenzaprine (FLEXERIL) 5 MG tablet, Take 1 tablet (5 mg total) by mouth 3 (three) times daily as needed for muscle spasms. (Patient not taking: Reported on 04/26/2018), Disp: 30 tablet, Rfl: 1 .  fexofenadine (ALLEGRA) 180 MG tablet, Take 180 mg by mouth daily as needed for allergies. , Disp: , Rfl:  .  fluticasone (FLONASE) 50 MCG/ACT nasal spray, Place 2 sprays into both nostrils daily as needed for allergies or rhinitis., Disp: , Rfl:  .  Fluticasone-Salmeterol (ADVAIR) 250-50 MCG/DOSE AEPB, Inhale 1 puff into the lungs 2 (two) times daily as needed (for respiratory issues.)., Disp: , Rfl:  .  lansoprazole (PREVACID) 30 MG capsule, Take 30 mg by mouth 2 (two) times daily before a meal. , Disp: , Rfl:  .  lidocaine-prilocaine (EMLA) cream, Apply 1 application topically  as needed. Apply generously over the Mediport 45 minutes prior to chemotherapy. (Patient not taking: Reported on 04/26/2018), Disp: 30 g, Rfl: 0 .  losartan (COZAAR) 25 MG tablet, Take 25 mg by mouth daily. , Disp: , Rfl:  .  metoCLOPramide (REGLAN) 10 MG tablet, Take 1 tablet (10 mg total) by mouth every 8 (eight) hours as needed for nausea., Disp: 40 tablet, Rfl: 3 .  mupirocin ointment (BACTROBAN) 2 %, , Disp: , Rfl: 0 .  ondansetron (ZOFRAN) 8 MG tablet, Take 1 tablet (8 mg total) by mouth every 8 (eight) hours as needed for nausea or vomiting  (start 3 days; after chemo). (Patient not taking: Reported on 04/26/2018), Disp: 40 tablet, Rfl: 1 .  osimertinib mesylate (TAGRISSO) 80 MG tablet, Take 1 tablet (80 mg total) by mouth daily., Disp: 30 tablet, Rfl: 5 .  polyethylene glycol (MIRALAX / GLYCOLAX) packet, Take 17 g by mouth daily as needed for mild constipation., Disp: , Rfl:  .  PREMARIN 1.25 MG tablet, , Disp: , Rfl: 1 .  prochlorperazine (COMPAZINE) 10 MG tablet, Take 1 tablet (10 mg total) by mouth every 6 (six) hours as needed for nausea or vomiting., Disp: 40 tablet, Rfl: 1 .  pseudoephedrine-guaifenesin (MUCINEX D) 60-600 MG 12 hr tablet, Take 1 tablet by mouth every 12 (twelve) hours., Disp: , Rfl:  .  triamcinolone cream (KENALOG) 0.1 %, Apply 1 application topically 2 (two) times daily. (Patient not taking: Reported on 04/26/2018), Disp: 80 g, Rfl: 0 .  Vitamin D, Ergocalciferol, (DRISDOL) 50000 units CAPS capsule, Take by mouth., Disp: , Rfl:  No current facility-administered medications for this visit.   Facility-Administered Medications Ordered in Other Visits:  .  heparin lock flush 100 unit/mL, 500 Units, Intravenous, Once, Finnegan, Kathlene November, MD .  sodium chloride flush (NS) 0.9 % injection 10 mL, 10 mL, Intravenous, PRN, Grayland Ormond, Kathlene November, MD .  sodium chloride flush (NS) 0.9 % injection 10 mL, 10 mL, Intravenous, PRN, Lloyd Huger, MD, 10 mL at 01/22/18 1419 .  sodium chloride flush (NS) 0.9 % injection 10 mL, 10 mL, Intravenous, PRN, Grayland Ormond, Kathlene November, MD, 10 mL at 05/21/18 1108   Allergies:  Penicillins and Sulfa antibiotics  Review of Systems:  Gen:  Denies  fever, sweats, chills weigh loss  HEENT: Denies blurred vision, double vision, ear pain, eye pain, hearing loss, nose bleeds, sore throat Cardiac:  No dizziness, chest pain or heaviness, chest tightness,edema, No JVD Resp:   No cough, -sputum production, +shortness of breath,-wheezing, -hemoptysis,  Gi: Denies swallowing difficulty, stomach  pain, nausea or vomiting, diarrhea, constipation, bowel incontinence Gu:  Denies bladder incontinence, burning urine Ext:   Denies Joint pain, stiffness or swelling Skin: Denies  skin rash, easy bruising or bleeding or hives Endoc:  Denies polyuria, polydipsia , polyphagia or weight change Psych:   Denies depression, insomnia or hallucinations  Other:  All other systems negative      BP 132/90 (BP Location: Left Arm, Cuff Size: Normal)   Pulse 78   Ht 5\' 4"  (1.626 m)   Wt 137 lb 9.6 oz (62.4 kg)   SpO2 98%   BMI 23.62 kg/m   Physical Examination:   GENERAL:NAD, no fevers, chills, no weakness no fatigue HEAD: Normocephalic, atraumatic.  EYES: Pupils equal, round, reactive to light. Extraocular muscles intact. No scleral icterus.  MOUTH: Moist mucosal membrane. Dentition intact. No abscess noted.  EAR, NOSE, THROAT: Clear without exudates. No external lesions.  NECK: Supple. No  thyromegaly. No nodules. No JVD.  PULMONARY: CTA B/L no wheezing, rhonchi, crackles CARDIOVASCULAR: S1 and S2. Regular rate and rhythm. No murmurs, rubs, or gallops. No edema. Pedal pulses 2+ bilaterally.  GASTROINTESTINAL: Soft, nontender, nondistended. No masses. Positive bowel sounds. No hepatosplenomegaly.  MUSCULOSKELETAL: No swelling, clubbing, or edema. Range of motion full in all extremities.  NEUROLOGIC: Cranial nerves II through XII are intact. No gross focal neurological deficits. Sensation intact. Reflexes intact.  SKIN: No ulceration, lesions, rashes, or cyanosis. Skin warm and dry. Turgor intact.  PSYCHIATRIC: Mood, affect within normal limits. The patient is awake, alert and oriented x 3. Insight, judgment intact.  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 All questions answered  Corrin Parker, M.D.  [facility]  Medical Director [place] Director [facility] Cardio-Pulmonary Department

## 2018-05-22 ENCOUNTER — Telehealth: Payer: Self-pay | Admitting: Pharmacist

## 2018-05-22 NOTE — Telephone Encounter (Signed)
Oral Oncology Patient Advocate Encounter  Received notification from Ms. Dinan that [employer] automatically re-enrolled her into their program to receive Tagrisso from the manufacturer at $0 out of pocket until 07/04/2019.   Patient knows to call the office with questions or concerns.  Oral Oncology Clinic will continue to follow.  Darl Pikes, PharmD, BCPS, [facility] Hematology/Oncology Clinical Pharmacist [facility] [phone]  05/22/2018 10:44 AM

## 2018-07-22 NOTE — Progress Notes (Signed)
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  INTERVAL HISTORY: Patient returns to clinic today for laboratory work and routine 66-monthevaluation.  She continues to have chronic fatigue and occasional diarrhea, but is otherwise tolerating her Tagrisso well.  She is noticed increased right flank pain that occasionally keeps her up at night.  She otherwise feels well. She has no neurologic complaints.  She denies any recent fevers or illnesses.  She has a good appetite and denies weight loss.  She denies any shortness of breath, cough, hemoptysis.  She denies any nausea, vomiting, constipation, or diarrhea.  She has no urinary complaints.  Patient offers no further specific complaints today.  REVIEW OF SYSTEMS:   Review of Systems  Constitutional: Positive for malaise/fatigue. Negative for fever and weight loss.  Respiratory: Negative.  Negative for cough, hemoptysis and shortness of breath.   Cardiovascular: Negative.  Negative for chest pain and leg swelling.  Gastrointestinal: Positive for diarrhea. Negative for abdominal pain, constipation and nausea.  Genitourinary: Positive for flank pain. Negative for dysuria.  Musculoskeletal: Negative for back pain.  Skin: Negative.  Negative for rash.  Neurological: Negative.  Negative for sensory change, focal weakness, weakness and headaches.  Psychiatric/Behavioral: The patient is nervous/anxious.     As per HPI. Otherwise, a complete review of systems is negative.  PAST MEDICAL HISTORY: Past Medical History:  Diagnosis Date  . Acid reflux   . Anemia    HAD TO HAVE IRON INFUSIONS BACK IN 2015  . Arthritis    bil hands and back  . Asthma   . [place]   lung cancer - chemo,  Dr OGenevive Biremoved RML Lobectomy.   .Marland KitchenDyspnea    WITH EXERTION  . Hypertension   . Mitral valve prolapse   . Personal history of chemotherapy    F/U Lung Cancer  . Personal history of radiation therapy    Lung cancer  . [place]    PAST SURGICAL HISTORY: Past Surgical History:  Procedure Laterality Date  . ABDOMINAL HYSTERECTOMY    . BREAST EXCISIONAL BIOPSY Left 1994 (-), 2004 (-)   benign  . COLONOSCOPY  2008    Dr. SDonnella Sham . KNEE SURGERY  2007  . LUNG LOBECTOMY Right    middle lobe  . PORTACATH PLACEMENT Right 02/09/2017   Procedure: INSERTION PORT-A-CATH;  Surgeon: ONestor Lewandowsky MD;  Location: [facility] ORS;  Service: General;  Laterality: Right;  . SALIVARY GLAND SURGERY Left 1983   with lymph node removal also  . UPPER GI ENDOSCOPY  2008  . VIDEO BRONCHOSCOPY WITH ENDOBRONCHIAL ULTRASOUND Right 01/24/2017   Procedure: VIDEO BRONCHOSCOPY WITH ENDOBRONCHIAL ULTRASOUND;  Surgeon: RLaverle Hobby MD;  Location: [facility] ORS;  Service: Pulmonary;  Laterality: Right;    FAMILY HISTORY: Family History  Problem Relation Age of Onset  . Breast cancer Maternal Aunt   . Breast cancer Paternal Aunt   . Breast cancer Maternal Grandmother   . Breast cancer Maternal Aunt   . Breast cancer Paternal Aunt   . Healthy Mother   . Healthy Father     ADVANCED DIRECTIVES (Y/N):  N  HEALTH MAINTENANCE: Social History   Tobacco Use  . Smoking status: Never Smoker  .  Smokeless tobacco: Never Used  Substance Use Topics  . Alcohol use: Yes    Alcohol/week: 7.0 standard drinks    Types: 7 Glasses of wine per week  . Drug use: No     Colonoscopy:  PAP:  Bone density:  Lipid panel:  Allergies  Allergen Reactions  . Penicillins Swelling and Rash    Has patient had a PCN reaction causing immediate rash, facial/tongue/throat swelling, SOB or lightheadedness with hypotension: Unknown Has patient had a PCN reaction causing severe rash  involving mucus membranes or skin necrosis: Unknown Has patient had a PCN reaction that required hospitalization: No Has patient had a PCN reaction occurring within the last 10 years: No If all of the above answers are "NO", then may proceed with Cephalosporin use.    . Sulfa Antibiotics Nausea And Vomiting    Current Outpatient Medications  Medication Sig Dispense Refill  . albuterol (PROVENTIL HFA;VENTOLIN HFA) 108 (90 Base) MCG/ACT inhaler Inhale 1-2 puffs into the lungs every 6 (six) hours as needed for wheezing or shortness of breath.    . B Complex Vitamins (VITAMIN B COMPLEX PO) Take 1 tablet by mouth daily.     . Biotin w/ Vitamins C & E (HAIR/SKIN/NAILS PO) Take 1 tablet by mouth daily.    . Calcium Carb-Cholecalciferol (CALCIUM 600+D) 600-800 MG-UNIT TABS Take 2 tablets by mouth daily.    . Cholecalciferol (D3 MAXIMUM STRENGTH) 5000 units capsule Take 5,000 Units by mouth daily.    . COLLAGEN PO Take 1,000 mg by mouth daily.    . cyclobenzaprine (FLEXERIL) 5 MG tablet Take 1 tablet (5 mg total) by mouth 3 (three) times daily as needed for muscle spasms. 30 tablet 1  . fexofenadine (ALLEGRA) 180 MG tablet Take 180 mg by mouth daily as needed for allergies.     . fluticasone (FLONASE) 50 MCG/ACT nasal spray Place 2 sprays into both nostrils daily as needed for allergies or rhinitis.    . Fluticasone-Salmeterol (ADVAIR) 250-50 MCG/DOSE AEPB Inhale 1 puff into the lungs 2 (two) times daily as needed (for respiratory issues.).    Marland Kitchen lansoprazole (PREVACID) 30 MG capsule Take 30 mg by mouth 2 (two) times daily before a meal.     . lidocaine-prilocaine (EMLA) cream Apply 1 application topically as needed. Apply generously over the Mediport 45 minutes prior to chemotherapy. 30 g 0  . losartan (COZAAR) 25 MG tablet Take 25 mg by mouth daily.     . metoCLOPramide (REGLAN) 10 MG tablet Take 1 tablet (10 mg total) by mouth every 8 (eight) hours as needed for nausea. 40 tablet 3  . mupirocin  ointment (BACTROBAN) 2 %   0  . ondansetron (ZOFRAN) 8 MG tablet Take 1 tablet (8 mg total) by mouth every 8 (eight) hours as needed for nausea or vomiting (start 3 days; after chemo). 40 tablet 1  . osimertinib mesylate (TAGRISSO) 80 MG tablet Take 1 tablet (80 mg total) by mouth daily. 30 tablet 5  . polyethylene glycol (MIRALAX / GLYCOLAX) packet Take 17 g by mouth daily as needed for mild constipation.    Marland Kitchen PREMARIN 1.25 MG tablet   1  . prochlorperazine (COMPAZINE) 10 MG tablet Take 1 tablet (10 mg total) by mouth every 6 (six) hours as needed for nausea or vomiting. 40 tablet 1  . pseudoephedrine-guaifenesin (MUCINEX D) 60-600 MG 12 hr tablet Take 1 tablet by mouth every 12 (twelve) hours.    . triamcinolone cream (KENALOG) 0.1 % Apply 1  application topically 2 (two) times daily. 80 g 0  . Vitamin D, Ergocalciferol, (DRISDOL) 50000 units CAPS capsule Take 50,000 Units by mouth every 7 (seven) days.     Marland Kitchen doxycycline (VIBRAMYCIN) 100 MG capsule Take 1 capsule (100 mg total) by mouth 2 (two) times daily. 14 capsule 0  . prochlorperazine (COMPAZINE) 10 MG tablet Take 1 tablet (10 mg total) by mouth every 6 (six) hours as needed for nausea or vomiting. 30 tablet 0  . triamcinolone (KENALOG) 0.1 % paste Use as directed 1 application in the mouth or throat 2 (two) times daily. 5 g 12   No current facility-administered medications for this visit.    Facility-Administered Medications Ordered in Other Visits  Medication Dose Route Frequency Provider Last Rate Last Dose  . heparin lock flush 100 unit/mL  500 Units Intravenous Once Lloyd Huger, MD      . sodium chloride flush (NS) 0.9 % injection 10 mL  10 mL Intravenous PRN Lloyd Huger, MD      . sodium chloride flush (NS) 0.9 % injection 10 mL  10 mL Intravenous PRN Lloyd Huger, MD   10 mL at 01/22/18 1419    OBJECTIVE: Vitals:   07/24/18 1449  BP: 135/84  Pulse: 73  Resp: 18  Temp: (!) 96.5 F (35.8 C)     Body  mass index is 23.26 kg/m.    ECOG FS:0 - Asymptomatic  General: Well-developed, well-nourished, no acute distress. Eyes: Pink conjunctiva, anicteric sclera. HEENT: Normocephalic, moist mucous membranes. Lungs: Clear to auscultation bilaterally. Heart: Regular rate and rhythm. No rubs, murmurs, or gallops. Abdomen: Soft, nontender, nondistended. No organomegaly noted, normoactive bowel sounds. Musculoskeletal: No edema, cyanosis, or clubbing. Neuro: Alert, answering all questions appropriately. Cranial nerves grossly intact. Skin: No rashes or petechiae noted. Psych: Normal affect.  LAB RESULTS:  Lab Results  Component Value Date   NA 137 07/24/2018   K 3.5 07/24/2018   CL 105 07/24/2018   CO2 23 07/24/2018   GLUCOSE 101 (H) 07/24/2018   BUN 13 07/24/2018   CREATININE 0.92 07/24/2018   CALCIUM 8.7 (L) 07/24/2018   PROT 7.0 07/24/2018   ALBUMIN 3.8 07/24/2018   AST 34 07/24/2018   ALT 28 07/24/2018   ALKPHOS 55 07/24/2018   BILITOT 0.3 07/24/2018   GFRNONAA >60 07/24/2018   GFRAA >60 07/24/2018    Lab Results  Component Value Date   WBC 3.6 (L) 07/24/2018   NEUTROABS 2.4 07/24/2018   HGB 11.5 (L) 07/24/2018   HCT 35.1 (L) 07/24/2018   MCV 91.4 07/24/2018   PLT 162 07/24/2018     STUDIES: No results found.  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  She initially received 4 cycles of adjuvant cisplatin and Alimta completing on January 23, 2014.  Patient was on the [facility] protocol for maintenance Tarceva versus placebo, but discontinued treatment in November 2015 because of significant side effects.  Although blinded, presumption was she was receiving Tarceva.  She recently was noted to have a recurrence confirmed by right paratracheal lymph node biopsy on January 24, 2017.  PET scan on February 03, 2017 did not reveal any distant metastasis.  She underwent treatment with weekly carboplatinum and Taxol along  with daily XRT finishing in October 2018.  She initiated maintenance durvalumab on June 29, 2017.  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  Patient started on Tagrisso in April 2019.   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  PLAN:   1. 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  Patient initiated Tagrisso in April 2019.  PET scan results from April 17, 2018 reviewed independently with no evidence of residual or progressive disease.  We once again discussed dose reducing her Tagrisso from 80 mg to 40 mg given patient's ongoing fatigue and diarrhea, but she has declined.  Continue current dose as prescribed.  Because of her right flank pain, will get a CT scan of the chest in the next 1 to 2 weeks.  If negative, patient will return to clinic in 3 months for routine evaluation.   2.  Diarrhea: Chronic and unchanged.  Continue Lomotil as prescribed. 3.  Cough: Patient does not complain of this today.  Monitor. 4.  Mouth sores: Continue topical gel as needed. 5.  Flank pain: Will get CT of the chest as above.  Patient expressed understanding and was in agreement with this plan. She also understands that She can call clinic at any time with any questions, concerns, or complaints.   Cancer Staging No matching staging information was found for the patient.  Lloyd Huger, MD   07/25/2018 6:21 AM

## 2018-07-24 ENCOUNTER — Inpatient Hospital Stay (HOSPITAL_BASED_OUTPATIENT_CLINIC_OR_DEPARTMENT_OTHER): Payer: Medicare Other | Admitting: Oncology

## 2018-07-24 ENCOUNTER — Inpatient Hospital Stay: Payer: Medicare Other | Attending: Oncology

## 2018-07-24 VITALS — BP 135/84 | HR 73 | Temp 96.5°F | Resp 18 | Wt 135.5 lb

## 2018-07-24 DIAGNOSIS — Z452 Encounter for adjustment and management of vascular access device: Secondary | ICD-10-CM | POA: Insufficient documentation

## 2018-07-24 DIAGNOSIS — C3490 Malignant neoplasm of unspecified part of unspecified bronchus or lung: Secondary | ICD-10-CM

## 2018-07-24 DIAGNOSIS — R197 Diarrhea, unspecified: Secondary | ICD-10-CM | POA: Insufficient documentation

## 2018-07-24 DIAGNOSIS — R109 Unspecified abdominal pain: Secondary | ICD-10-CM | POA: Diagnosis not present

## 2018-07-24 DIAGNOSIS — C342 Malignant neoplasm of middle lobe, bronchus or lung: Secondary | ICD-10-CM | POA: Diagnosis not present

## 2018-07-24 LAB — CBC WITH DIFFERENTIAL/PLATELET
ABS IMMATURE GRANULOCYTES: 0.01 10*3/uL (ref 0.00–0.07)
BASOS ABS: 0 10*3/uL (ref 0.0–0.1)
Basophils Relative: 1 %
EOS ABS: 0.1 10*3/uL (ref 0.0–0.5)
Eosinophils Relative: 3 %
HEMATOCRIT: 35.1 % — AB (ref 36.0–46.0)
Hemoglobin: 11.5 g/dL — ABNORMAL LOW (ref 12.0–15.0)
IMMATURE GRANULOCYTES: 0 %
LYMPHS ABS: 0.8 10*3/uL (ref 0.7–4.0)
Lymphocytes Relative: 22 %
MCH: 29.9 pg (ref 26.0–34.0)
MCHC: 32.8 g/dL (ref 30.0–36.0)
MCV: 91.4 fL (ref 80.0–100.0)
MONOS PCT: 9 %
Monocytes Absolute: 0.3 10*3/uL (ref 0.1–1.0)
NEUTROS ABS: 2.4 10*3/uL (ref 1.7–7.7)
NEUTROS PCT: 65 %
NRBC: 0 % (ref 0.0–0.2)
Platelets: 162 10*3/uL (ref 150–400)
RBC: 3.84 MIL/uL — ABNORMAL LOW (ref 3.87–5.11)
RDW: 14.2 % (ref 11.5–15.5)
WBC: 3.6 10*3/uL — ABNORMAL LOW (ref 4.0–10.5)

## 2018-07-24 LAB — COMPREHENSIVE METABOLIC PANEL
ALBUMIN: 3.8 g/dL (ref 3.5–5.0)
ALK PHOS: 55 U/L (ref 38–126)
ALT: 28 U/L (ref 0–44)
ANION GAP: 9 (ref 5–15)
AST: 34 U/L (ref 15–41)
BUN: 13 mg/dL (ref 8–23)
CO2: 23 mmol/L (ref 22–32)
Calcium: 8.7 mg/dL — ABNORMAL LOW (ref 8.9–10.3)
Chloride: 105 mmol/L (ref 98–111)
Creatinine, Ser: 0.92 mg/dL (ref 0.44–1.00)
GFR calc non Af Amer: >60 mL/min (ref >60)
GLUCOSE: 101 mg/dL — AB (ref 70–99)
Potassium: 3.5 mmol/L (ref 3.5–5.1)
Sodium: 137 mmol/L (ref 135–145)
Total Bilirubin: 0.3 mg/dL (ref 0.3–1.2)
Total Protein: 7 g/dL (ref 6.5–8.1)

## 2018-07-24 MED ORDER — DOXYCYCLINE HYCLATE 100 MG PO CAPS: 100.0000 mg | ORAL_CAPSULE | Freq: Two times a day (BID) | ORAL | 0 refills | Status: DC

## 2018-07-24 MED ORDER — PROCHLORPERAZINE MALEATE 10 MG PO TABS
10.0000 mg | ORAL_TABLET | Freq: Four times a day (QID) | ORAL | 1 refills | Status: DC | PRN
Start: 2018-07-24 — End: 2018-10-29

## 2018-07-24 MED ORDER — TRIAMCINOLONE ACETONIDE 0.1 % MT PSTE: 1.0000 "application " | PASTE | Freq: Two times a day (BID) | OROMUCOSAL | 12 refills | Status: DC

## 2018-07-24 MED ORDER — HEPARIN SOD (PORK) LOCK FLUSH 100 UNIT/ML IV SOLN
500.0000 [IU] | Freq: Once | INTRAVENOUS | Status: AC
  Administered 2018-07-24: 500 [IU] via INTRAVENOUS
  Filled 2018-07-24: qty 5

## 2018-07-24 MED ORDER — SODIUM CHLORIDE 0.9% FLUSH
10.0000 mL | INTRAVENOUS | Status: DC | PRN
  Administered 2018-07-24: 10 mL via INTRAVENOUS
  Filled 2018-07-24: qty 10

## 2018-07-24 MED ORDER — PROCHLORPERAZINE MALEATE 10 MG PO TABS: 10.0000 mg | ORAL_TABLET | Freq: Four times a day (QID) | ORAL | 0 refills | Status: DC | PRN

## 2018-07-24 NOTE — Progress Notes (Signed)
Pt here for follow up. States she has " a little bit of diarrhea and a little bit of nausea."

## 2018-07-24 NOTE — Progress Notes (Signed)
Patient is here today to follow up on her Recurrent non-small cell lung cancer.

## 2018-07-27 ENCOUNTER — Encounter: Payer: Self-pay | Admitting: *Deleted

## 2018-07-30 ENCOUNTER — Ambulatory Visit
Admission: RE | Admit: 2018-07-30 | Discharge: 2018-07-30 | Disposition: A | Discharge status: Home / Self Care | Payer: Medicare Other | Source: Ambulatory Visit | Attending: Oncology | Admitting: Oncology

## 2018-07-30 DIAGNOSIS — C3490 Malignant neoplasm of unspecified part of unspecified bronchus or lung: Secondary | ICD-10-CM | POA: Diagnosis not present

## 2018-07-30 MED ORDER — IOHEXOL 300 MG/ML  SOLN
75.0000 mL | Freq: Once | INTRAMUSCULAR | Status: AC | PRN
  Administered 2018-07-30: 75 mL via INTRAVENOUS

## 2018-08-02 ENCOUNTER — Telehealth: Payer: Self-pay | Admitting: *Deleted

## 2018-08-02 NOTE — Telephone Encounter (Signed)
Patient made aware of CT results, keep f/u as planned in 3 months.

## 2018-09-05 ENCOUNTER — Ambulatory Visit: Payer: Medicare Other | Admitting: Radiation Oncology

## 2018-09-10 ENCOUNTER — Ambulatory Visit: Payer: Medicare Other | Admitting: Radiation Oncology

## 2018-09-11 ENCOUNTER — Other Ambulatory Visit: Payer: Self-pay | Admitting: Internal Medicine

## 2018-09-11 ENCOUNTER — Other Ambulatory Visit: Payer: Self-pay | Admitting: Oncology

## 2018-09-11 DIAGNOSIS — N644 Mastodynia: Secondary | ICD-10-CM

## 2018-09-11 DIAGNOSIS — N6452 Nipple discharge: Secondary | ICD-10-CM

## 2018-09-11 DIAGNOSIS — C342 Malignant neoplasm of middle lobe, bronchus or lung: Secondary | ICD-10-CM

## 2018-09-11 MED ORDER — OSIMERTINIB MESYLATE 80 MG PO TABS: 80.0000 mg | ORAL_TABLET | Freq: Every day | ORAL | 5 refills | Status: DC

## 2018-09-12 ENCOUNTER — Other Ambulatory Visit: Payer: Self-pay | Admitting: Internal Medicine

## 2018-09-12 DIAGNOSIS — N6452 Nipple discharge: Secondary | ICD-10-CM

## 2018-09-12 DIAGNOSIS — N644 Mastodynia: Secondary | ICD-10-CM

## 2018-09-14 ENCOUNTER — Other Ambulatory Visit: Payer: Self-pay

## 2018-09-14 ENCOUNTER — Ambulatory Visit
Admission: RE | Admit: 2018-09-14 | Discharge: 2018-09-14 | Disposition: A | Discharge status: Home / Self Care | Payer: Medicare Other | Source: Ambulatory Visit | Attending: Internal Medicine | Admitting: Internal Medicine

## 2018-09-14 DIAGNOSIS — N6452 Nipple discharge: Secondary | ICD-10-CM | POA: Insufficient documentation

## 2018-09-14 DIAGNOSIS — N644 Mastodynia: Secondary | ICD-10-CM | POA: Diagnosis present

## 2018-09-17 ENCOUNTER — Inpatient Hospital Stay: Payer: Medicare Other | Attending: Oncology

## 2018-09-17 ENCOUNTER — Other Ambulatory Visit: Payer: Self-pay

## 2018-09-17 ENCOUNTER — Ambulatory Visit
Admission: RE | Admit: 2018-09-17 | Discharge: 2018-09-17 | Disposition: A | Discharge status: Home / Self Care | Payer: Medicare Other | Source: Ambulatory Visit | Attending: Radiation Oncology | Admitting: Radiation Oncology

## 2018-09-17 ENCOUNTER — Encounter: Payer: Self-pay | Admitting: Radiation Oncology

## 2018-09-17 VITALS — BP 152/90 | HR 80 | Temp 95.9°F | Resp 16 | Wt 135.4 lb

## 2018-09-17 DIAGNOSIS — Z902 Acquired absence of lung [part of]: Secondary | ICD-10-CM | POA: Insufficient documentation

## 2018-09-17 DIAGNOSIS — Z85118 Personal history of other malignant neoplasm of bronchus and lung: Secondary | ICD-10-CM | POA: Diagnosis present

## 2018-09-17 DIAGNOSIS — J90 Pleural effusion, not elsewhere classified: Secondary | ICD-10-CM | POA: Diagnosis not present

## 2018-09-17 DIAGNOSIS — Z452 Encounter for adjustment and management of vascular access device: Secondary | ICD-10-CM | POA: Diagnosis not present

## 2018-09-17 DIAGNOSIS — C342 Malignant neoplasm of middle lobe, bronchus or lung: Secondary | ICD-10-CM | POA: Diagnosis not present

## 2018-09-17 DIAGNOSIS — Z923 Personal history of irradiation: Secondary | ICD-10-CM | POA: Insufficient documentation

## 2018-09-17 DIAGNOSIS — Z95828 Presence of other vascular implants and grafts: Secondary | ICD-10-CM

## 2018-09-17 MED ORDER — HEPARIN SOD (PORK) LOCK FLUSH 100 UNIT/ML IV SOLN
500.0000 [IU] | Freq: Once | INTRAVENOUS | Status: AC
  Administered 2018-09-17: 500 [IU] via INTRAVENOUS

## 2018-09-17 MED ORDER — SODIUM CHLORIDE 0.9% FLUSH
10.0000 mL | Freq: Once | INTRAVENOUS | Status: AC
  Administered 2018-09-17: 10 mL via INTRAVENOUS
  Filled 2018-09-17: qty 10

## 2018-09-17 NOTE — Progress Notes (Signed)
Radiation Oncology Follow up Note  Name: Lauren Page   Date:   09/17/2018 MRN:  [id number] DOB: 09/12/1950    This 68 y.o. 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  REFERRING PROVIDER: [name], MD  HPI: patient is a 68 year old female now out 15 months having completed radiation therapy to her chest as salvage therapy status post right middle lobectomy for adenocarcinoma back in 2015. Seen today in routine follow-up she is doing fairly well she specifically denies hemoptysis does have a mild cough. She has a fullness and some pain occasionally in her right chest does have a known pleural effusion in that area..she had a CT scan back in January which was a large right pleural effusion has decreased slightly in size. She's also status post right middle lobectomy. No evidence of progressive or active disease.  COMPLICATIONS OF TREATMENT: none  FOLLOW UP COMPLIANCE: keeps appointments   PHYSICAL EXAM:  BP (!) 152/90 (BP Location: Left Arm, Patient Position: Sitting)   Pulse 80   Temp (!) 95.9 F (35.5 C) (Tympanic)   Resp 16   Wt 135 lb 5.8 oz (61.4 kg)   BMI 23.23 kg/m  Well-developed well-nourished patient in NAD. HEENT reveals PERLA, EOMI, discs not visualized.  Oral cavity is clear. No oral mucosal lesions are identified. Neck is clear without evidence of cervical or supraclavicular adenopathy. Lungs are clear to A&P. Cardiac examination is essentially unremarkable with regular rate and rhythm without murmur rub or thrill. Abdomen is benign with no organomegaly or masses noted. Motor sensory and DTR levels are equal and symmetric in the upper and lower extremities. Cranial nerves II through XII are grossly intact. Proprioception is intact. No peripheral adenopathy or edema is identified. No motor or sensory levels are noted. Crude visual  fields are within normal range.  RADIOLOGY RESULTS: CT scan reviewed and compatible above-stated findings.  PLAN: present time patient is doing well with no evidence of disease. I've explained to her about her pleural effusion that this has a tendency to cause pain in the chest. Otherwise I've asked to see her back in 1 year for follow-up. Patient is to call sooner with any concerns.  I would like to take this opportunity to thank you for allowing me to participate in the care of your patient.Noreene Filbert, MD

## 2018-10-20 ENCOUNTER — Other Ambulatory Visit: Payer: Self-pay | Admitting: Nurse Practitioner

## 2018-10-23 ENCOUNTER — Other Ambulatory Visit: Payer: Self-pay

## 2018-10-23 DIAGNOSIS — C3490 Malignant neoplasm of unspecified part of unspecified bronchus or lung: Secondary | ICD-10-CM

## 2018-10-24 ENCOUNTER — Other Ambulatory Visit: Payer: Self-pay

## 2018-10-25 ENCOUNTER — Inpatient Hospital Stay: Payer: Medicare Other | Attending: Oncology

## 2018-10-25 ENCOUNTER — Other Ambulatory Visit: Payer: Self-pay

## 2018-10-25 VITALS — BP 148/98 | HR 77 | Temp 97.8°F | Resp 20

## 2018-10-25 DIAGNOSIS — Z452 Encounter for adjustment and management of vascular access device: Secondary | ICD-10-CM | POA: Insufficient documentation

## 2018-10-25 DIAGNOSIS — R197 Diarrhea, unspecified: Secondary | ICD-10-CM | POA: Insufficient documentation

## 2018-10-25 DIAGNOSIS — Z95828 Presence of other vascular implants and grafts: Secondary | ICD-10-CM

## 2018-10-25 DIAGNOSIS — C3490 Malignant neoplasm of unspecified part of unspecified bronchus or lung: Secondary | ICD-10-CM | POA: Insufficient documentation

## 2018-10-25 LAB — CBC WITH DIFFERENTIAL/PLATELET
Abs Immature Granulocytes: 0.02 10*3/uL (ref 0.00–0.07)
Basophils Absolute: 0 10*3/uL (ref 0.0–0.1)
Basophils Relative: 1 %
Eosinophils Absolute: 0.2 10*3/uL (ref 0.0–0.5)
Eosinophils Relative: 5 %
HCT: 34.5 % — ABNORMAL LOW (ref 36.0–46.0)
Hemoglobin: 11.4 g/dL — ABNORMAL LOW (ref 12.0–15.0)
Immature Granulocytes: 1 %
Lymphocytes Relative: 15 %
Lymphs Abs: 0.6 10*3/uL — ABNORMAL LOW (ref 0.7–4.0)
MCH: 29.9 pg (ref 26.0–34.0)
MCHC: 33 g/dL (ref 30.0–36.0)
MCV: 90.6 fL (ref 80.0–100.0)
Monocytes Absolute: 0.4 10*3/uL (ref 0.1–1.0)
Monocytes Relative: 9 %
Neutro Abs: 2.8 10*3/uL (ref 1.7–7.7)
Neutrophils Relative %: 69 %
Platelets: 211 10*3/uL (ref 150–400)
RBC: 3.81 MIL/uL — ABNORMAL LOW (ref 3.87–5.11)
RDW: 14.1 % (ref 11.5–15.5)
WBC: 4.1 10*3/uL (ref 4.0–10.5)
nRBC: 0 % (ref 0.0–0.2)

## 2018-10-25 LAB — COMPREHENSIVE METABOLIC PANEL
ALT: 31 U/L (ref 0–44)
AST: 38 U/L (ref 15–41)
Albumin: 3.7 g/dL (ref 3.5–5.0)
Alkaline Phosphatase: 66 U/L (ref 38–126)
Anion gap: 6 (ref 5–15)
BUN: 20 mg/dL (ref 8–23)
CO2: 26 mmol/L (ref 22–32)
Calcium: 8.9 mg/dL (ref 8.9–10.3)
Chloride: 105 mmol/L (ref 98–111)
Creatinine, Ser: 0.91 mg/dL (ref 0.44–1.00)
GFR calc Af Amer: >60 mL/min (ref >60)
GFR calc non Af Amer: >60 mL/min (ref >60)
Glucose, Bld: 112 mg/dL — ABNORMAL HIGH (ref 70–99)
Potassium: 4.2 mmol/L (ref 3.5–5.1)
Sodium: 137 mmol/L (ref 135–145)
Total Bilirubin: 0.4 mg/dL (ref 0.3–1.2)
Total Protein: 6.9 g/dL (ref 6.5–8.1)

## 2018-10-25 MED ORDER — HEPARIN SOD (PORK) LOCK FLUSH 100 UNIT/ML IV SOLN
500.0000 [IU] | Freq: Once | INTRAVENOUS | Status: AC
  Administered 2018-10-25: 500 [IU] via INTRAVENOUS

## 2018-10-25 MED ORDER — SODIUM CHLORIDE 0.9% FLUSH
10.0000 mL | Freq: Once | INTRAVENOUS | Status: AC
  Administered 2018-10-25: 10 mL via INTRAVENOUS
  Filled 2018-10-25: qty 10

## 2018-10-26 ENCOUNTER — Other Ambulatory Visit: Payer: Self-pay

## 2018-10-26 LAB — THYROID PANEL WITH TSH
Free Thyroxine Index: 1.5 (ref 1.2–4.9)
T3 Uptake Ratio: 18 % — ABNORMAL LOW (ref 24–39)
T4, Total: 8.6 ug/dL (ref 4.5–12.0)
TSH: 1.95 u[IU]/mL (ref 0.450–4.500)

## 2018-10-29 ENCOUNTER — Ambulatory Visit: Payer: Medicare Other | Admitting: Oncology

## 2018-10-29 ENCOUNTER — Encounter: Payer: Self-pay | Admitting: Oncology

## 2018-10-29 ENCOUNTER — Other Ambulatory Visit: Payer: Self-pay

## 2018-10-29 ENCOUNTER — Inpatient Hospital Stay (HOSPITAL_BASED_OUTPATIENT_CLINIC_OR_DEPARTMENT_OTHER): Payer: Medicare Other | Admitting: Oncology

## 2018-10-29 ENCOUNTER — Other Ambulatory Visit: Payer: Medicare Other

## 2018-10-29 DIAGNOSIS — C3490 Malignant neoplasm of unspecified part of unspecified bronchus or lung: Secondary | ICD-10-CM

## 2018-10-29 NOTE — Progress Notes (Signed)
Patient stated that she would like to discuss her finger nails. One of them fell about six months ago and for the past month three of them are almost falling off. Patient also stated that she continues to be SOB and cough on exertion.

## 2018-10-29 NOTE — Progress Notes (Signed)
[facility]  [phone] [phone]  ID: Lauren Page OB: July 15, 1950  MR#: [id number]  KPQ#:[id number]  Patient Care Team: [name], MD as PCP - General (Internal Medicine)  I connected with [name] on 10/29/18 at 11:00 AM EDT by video enabled telemedicine visit and verified that I am speaking with the correct person using two identifiers.   I discussed the limitations, risks, security and privacy concerns of performing an evaluation and management service by telemedicine and the availability of in-person appointments. I also discussed with the patient that there may be a patient responsible charge related to this service. The patient expressed understanding and agreed to proceed.   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  INTERVAL HISTORY: Patient agreed to video enabled telemedicine visit to discuss her laboratory work and evaluation.  She is having problems with fingernail cracking and "falling off", but denies any infection.  She also recently had an episode of bloody nipple discharge which partially resolved with antibiotics.  Mammogram and ultrasound were reported as benign.  She otherwise feels well. She has no neurologic complaints.  She denies any recent fevers or illnesses.  She has a good appetite and denies weight loss.  She denies any chest pain or hemoptysis, but admits to persistent and chronic shortness of breath and cough.  She denies any nausea, vomiting, constipation, or diarrhea.  She has no urinary complaints.  Patient offers no further specific complaints today.  REVIEW OF SYSTEMS:   Review of Systems  Constitutional: Positive for malaise/fatigue. Negative for fever and weight loss.  Respiratory: Positive for cough and shortness of breath. Negative  for hemoptysis.   Cardiovascular: Negative.  Negative for chest pain and leg swelling.  Gastrointestinal: Negative for abdominal pain, constipation, diarrhea and nausea.  Genitourinary: Negative for dysuria and flank pain.  Musculoskeletal: Negative for back pain.  Skin: Negative.  Negative for rash.  Neurological: Negative.  Negative for sensory change, focal weakness, weakness and headaches.  Psychiatric/Behavioral: Negative.  The patient is not nervous/anxious.     As per HPI. Otherwise, a complete review of systems is negative.  PAST MEDICAL HISTORY: Past Medical History:  Diagnosis Date   Acid reflux    Anemia    HAD TO HAVE IRON INFUSIONS BACK IN 2015   Arthritis    bil hands and back   Asthma    Cancer of right lung ([place]) 2015   lung cancer - chemo, Dr Genevive Bi removed RML Lobectomy.    Dyspnea    WITH EXERTION   Hypertension    Mitral valve prolapse    Personal history of chemotherapy    F/U Lung Cancer   Personal history of radiation therapy    Lung cancer   Primary cancer of right middle lobe of lung ([place]) 03/11/2016    PAST SURGICAL HISTORY: Past Surgical History:  Procedure Laterality Date   ABDOMINAL HYSTERECTOMY     BREAST EXCISIONAL BIOPSY Left 1994 (-), 2004 (-)   benign   COLONOSCOPY  2008    Dr. Donnella Sham   KNEE SURGERY  2007   LUNG LOBECTOMY Right    middle lobe   PORTACATH PLACEMENT Right 02/09/2017   Procedure: INSERTION PORT-A-CATH;  Surgeon: Nestor Lewandowsky, MD;  Location: [facility] ORS;  Service: General;  Laterality: Right;   [place]  with lymph node removal also   UPPER GI ENDOSCOPY  2008   VIDEO BRONCHOSCOPY WITH ENDOBRONCHIAL ULTRASOUND Right 01/24/2017   Procedure: VIDEO BRONCHOSCOPY WITH ENDOBRONCHIAL ULTRASOUND;  Surgeon: [name], MD;  Location: [facility] ORS;  Service: Pulmonary;  Laterality: Right;    FAMILY HISTORY: Family History  Problem Relation Age of Onset   Breast cancer Maternal  Aunt    Breast cancer Paternal Aunt    Breast cancer Maternal Grandmother    Breast cancer Maternal Aunt    Breast cancer Paternal Aunt    Healthy Mother    Healthy Father     ADVANCED DIRECTIVES (Y/N):  N  HEALTH MAINTENANCE: Social History   Tobacco Use   Smoking status: Never Smoker   Smokeless tobacco: Never Used  Substance Use Topics   Alcohol use: Yes    Alcohol/week: 7.0 standard drinks    Types: 7 Glasses of wine per week   Drug use: No     Colonoscopy:  PAP:  Bone density:  Lipid panel:  Allergies  Allergen Reactions   Penicillins Swelling and Rash    Has patient had a PCN reaction causing immediate rash, facial/tongue/throat swelling, SOB or lightheadedness with hypotension: Unknown Has patient had a PCN reaction causing severe rash involving mucus membranes or skin necrosis: Unknown Has patient had a PCN reaction that required hospitalization: No Has patient had a PCN reaction occurring within the last 10 years: No If all of the above answers are "NO", then may proceed with Cephalosporin use.     Sulfa Antibiotics Nausea And Vomiting    Current Outpatient Medications  Medication Sig Dispense Refill   albuterol (PROVENTIL HFA;VENTOLIN HFA) 108 (90 Base) MCG/ACT inhaler Inhale 1-2 puffs into the lungs every 6 (six) hours as needed for wheezing or shortness of breath.     B Complex Vitamins (VITAMIN B COMPLEX PO) Take 1 tablet by mouth daily.      Biotin w/ Vitamins C & E (HAIR/SKIN/NAILS PO) Take 1 tablet by mouth daily.     Calcium Carb-Cholecalciferol (CALCIUM 600+D) 600-800 MG-UNIT TABS Take 2 tablets by mouth daily.     Cholecalciferol (D3 MAXIMUM STRENGTH) 5000 units capsule Take 5,000 Units by mouth daily.     COLLAGEN PO Take 1,000 mg by mouth daily.     doxycycline (VIBRAMYCIN) 100 MG capsule Take 1 capsule (100 mg total) by mouth 2 (two) times daily. 14 capsule 0   fexofenadine (ALLEGRA) 180 MG tablet Take 180 mg by mouth daily  as needed for allergies.      fluticasone (FLONASE) 50 MCG/ACT nasal spray Place 2 sprays into both nostrils daily as needed for allergies or rhinitis.     Fluticasone-Salmeterol (ADVAIR) 250-50 MCG/DOSE AEPB Inhale 1 puff into the lungs 2 (two) times daily as needed (for respiratory issues.).     lansoprazole (PREVACID) 30 MG capsule Take 30 mg by mouth 2 (two) times daily before a meal.      lidocaine-prilocaine (EMLA) cream Apply 1 application topically as needed. Apply generously over the Mediport 45 minutes prior to chemotherapy. 30 g 0   losartan (COZAAR) 25 MG tablet Take 25 mg by mouth daily.      meloxicam (MOBIC) 7.5 MG tablet Take 1 tablet by mouth 1 day or 1 dose.     metoCLOPramide (REGLAN) 10 MG tablet Take 1 tablet (10 mg total) by mouth every 8 (eight) hours as needed for nausea. 40 tablet 3   mupirocin ointment (BACTROBAN) 2 %   0  ondansetron (ZOFRAN) 8 MG tablet Take 1 tablet (8 mg total) by mouth every 8 (eight) hours as needed for nausea or vomiting (start 3 days; after chemo). 40 tablet 1   osimertinib mesylate (TAGRISSO) 80 MG tablet Take 1 tablet (80 mg total) by mouth daily. 30 tablet 5   polyethylene glycol (MIRALAX / GLYCOLAX) packet Take 17 g by mouth daily as needed for mild constipation.     PREMARIN 1.25 MG tablet   1   prochlorperazine (COMPAZINE) 10 MG tablet Take 1 tablet (10 mg total) by mouth every 6 (six) hours as needed for nausea or vomiting. 30 tablet 0   pseudoephedrine-guaifenesin (MUCINEX D) 60-600 MG 12 hr tablet Take 1 tablet by mouth every 12 (twelve) hours.     triamcinolone (KENALOG) 0.1 % paste Use as directed 1 application in the mouth or throat 2 (two) times daily. 5 g 12   Vitamin D, Ergocalciferol, (DRISDOL) 50000 units CAPS capsule Take 50,000 Units by mouth every 7 (seven) days.      No current facility-administered medications for this visit.    Facility-Administered Medications Ordered in Other Visits  Medication Dose  Route Frequency Provider Last Rate Last Dose   heparin lock flush 100 unit/mL  500 Units Intravenous Once Grayland Ormond, Kathlene November, MD       sodium chloride flush (NS) 0.9 % injection 10 mL  10 mL Intravenous PRN Lloyd Huger, MD       sodium chloride flush (NS) 0.9 % injection 10 mL  10 mL Intravenous PRN Lloyd Huger, MD   10 mL at 01/22/18 1419    OBJECTIVE: There were no vitals filed for this visit.   There is no height or weight on file to calculate BMI.    ECOG FS:0 - Asymptomatic  General: Well-developed, well-nourished, no acute distress. HEENT: Normocephalic, moist mucous membranes. Neuro: Alert, answering all questions appropriately. Cranial nerves grossly intact. Skin: No rashes or petechiae noted. Psych: Normal affect.  LAB RESULTS:  Lab Results  Component Value Date   NA 137 10/25/2018   K 4.2 10/25/2018   CL 105 10/25/2018   CO2 26 10/25/2018   GLUCOSE 112 (H) 10/25/2018   BUN 20 10/25/2018   CREATININE 0.91 10/25/2018   CALCIUM 8.9 10/25/2018   PROT 6.9 10/25/2018   ALBUMIN 3.7 10/25/2018   AST 38 10/25/2018   ALT 31 10/25/2018   ALKPHOS 66 10/25/2018   BILITOT 0.4 10/25/2018   GFRNONAA >60 10/25/2018   GFRAA >60 10/25/2018    Lab Results  Component Value Date   WBC 4.1 10/25/2018   NEUTROABS 2.8 10/25/2018   HGB 11.4 (L) 10/25/2018   HCT 34.5 (L) 10/25/2018   MCV 90.6 10/25/2018   PLT 211 10/25/2018     STUDIES: No results found.  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  She initially received 4 cycles of adjuvant cisplatin and Alimta completing on January 23, 2014.  Patient was on the [facility] protocol for maintenance Tarceva versus placebo, but discontinued treatment in November 2015 because of significant side effects.  Although blinded, presumption was she was receiving Tarceva.  She recently was noted to have a recurrence confirmed by right paratracheal lymph node  biopsy on January 24, 2017.  PET scan on February 03, 2017 did not reveal any distant metastasis.  She underwent treatment with weekly carboplatinum and Taxol along with daily XRT finishing in October 2018.  She initiated maintenance  durvalumab on June 29, 2017.  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  Patient started on Tagrisso in April 2019.   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  PLAN:   1. 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  Patient initiated Tagrisso in April 2019.  PET scan results from April 17, 2018 reviewed independently with no evidence of residual or progressive disease.  Repeat CT scan completed on July 30, 2018 revealed persistent right pleural effusion, but otherwise no evidence of recurrent or progressive disease.  We once again discussed dose reducing her Tagrisso from 80 mg to 40 mg, but patient declined and wishes to continue her current treatment as prescribed.  No intervention is needed.  Return to clinic in 3 months with repeat imaging and further evaluation. 2.  Diarrhea: Chronic and unchanged.  Continue Lomotil as prescribed. 3.  Cough: Chronic and unchanged, monitor. 4.  Fingernail cracking: Possibly secondary to [place].  Patient has follow-up with dermatology later this week.   I provided 25 minutes of face-to-face video visit time during this encounter, and > 50% was spent counseling as documented under my assessment & plan.   Patient expressed understanding and was in agreement with this plan. She also understands that She can call clinic at any time with any questions, concerns, or complaints.   Cancer Staging No matching staging information was found for the patient.  Lloyd Huger, MD   10/29/2018 1:49 PM

## 2018-11-01 ENCOUNTER — Other Ambulatory Visit: Payer: Medicare Other

## 2018-11-07 ENCOUNTER — Other Ambulatory Visit: Payer: Self-pay

## 2018-11-07 ENCOUNTER — Ambulatory Visit (INDEPENDENT_AMBULATORY_CARE_PROVIDER_SITE_OTHER): Payer: Medicare Other | Admitting: General Surgery

## 2018-11-07 ENCOUNTER — Encounter: Payer: Self-pay | Admitting: General Surgery

## 2018-11-07 VITALS — BP 141/86 | HR 73 | Temp 97.7°F | Resp 15 | Ht 64.0 in | Wt 131.6 lb

## 2018-11-07 DIAGNOSIS — N6452 Nipple discharge: Secondary | ICD-10-CM | POA: Insufficient documentation

## 2018-11-07 NOTE — Patient Instructions (Addendum)
Recommend left breast ductal excision when we are able to schedule.   Call if you would like to get this scheduled.

## 2018-11-07 NOTE — Progress Notes (Signed)
Patient ID: Lauren Page, female   DOB: Nov 23, 1950, 68 y.o.   MRN: [id number]  Chief Complaint  Patient presents with  . New Patient (Initial Visit)    left nipple discharge    HPI Lauren Page is a 68 y.o. female here for evaluation of left nipple bloody discharge. She first noticed this In January. The drainage was dark red and then became bright red and increased in amount. In March her primary care doctor put her on antibiotics for this and another infection and this decreased her drainage to 1-2 drops a week that was brown in color. In the last 2 weeks the drainage has started to increase again and is back to being bright red. She had a mammogram and ultrasound done of the left breast on 09/14/18. A breast MRI was recommended. She has a history of lung cancer in 2015 that has recurred twice.  She denies any injuries to the breast. There is a history of nipple oral contact.  No history of breast inflammation, swelling or tenderness.  Drainage has been spontaneous.  HPI  Past Medical History:  Diagnosis Date  . Acid reflux   . Anemia    HAD TO HAVE IRON INFUSIONS BACK IN 2015  . Arthritis    bil hands and back  . Asthma   . Cancer of right lung ([place]) 2015   lung cancer - chemo, Dr Genevive Bi removed RML Lobectomy.   Marland Kitchen Dyspnea    WITH EXERTION  . Hypertension   . Mitral valve prolapse   . Personal history of chemotherapy    F/U Lung Cancer  . Personal history of radiation therapy    Lung cancer  . 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 ABDOMINAL HYSTERECTOMY    . BREAST EXCISIONAL BIOPSY Left 1994 (-), 2004 (-)   benign  . COLONOSCOPY  2008    Dr. Donnella Sham  . KNEE SURGERY  2007  . LUNG LOBECTOMY Right    middle lobe  . PORTACATH PLACEMENT Right 02/09/2017   Procedure: INSERTION PORT-A-CATH;  Surgeon: Nestor Lewandowsky, MD;  Location: [facility] ORS;  Service: General;  Laterality: Right;  . SALIVARY GLAND  SURGERY Left 1983   with lymph node removal also  . UPPER GI ENDOSCOPY  2008  . VIDEO BRONCHOSCOPY WITH ENDOBRONCHIAL ULTRASOUND Right 01/24/2017   Procedure: VIDEO BRONCHOSCOPY WITH ENDOBRONCHIAL ULTRASOUND;  Surgeon: [name], MD;  Location: [facility] ORS;  Service: Pulmonary;  Laterality: Right;    Family History  Problem Relation Age of Onset  . Breast cancer Maternal Aunt   . Breast cancer Paternal Aunt   . Breast cancer Maternal Grandmother   . Breast cancer Maternal Aunt   . Breast cancer Paternal Aunt   . Healthy Mother   . Healthy Father     Social History Social History   Tobacco Use  . Smoking status: Never Smoker  . Smokeless tobacco: Never Used  Substance Use Topics  . Alcohol use: Yes    Alcohol/week: 7.0 standard drinks    Types: 7 Glasses of wine per week  . Drug use: No    Allergies  Allergen Reactions  . Penicillins Swelling and Rash    Has patient had a PCN reaction causing immediate rash, facial/tongue/throat swelling, SOB or lightheadedness with hypotension: Unknown Has patient had a PCN reaction causing severe rash involving mucus membranes or skin necrosis: Unknown Has patient had a PCN  reaction that required hospitalization: No Has patient had a PCN reaction occurring within the last 10 years: No If all of the above answers are "NO", then may proceed with Cephalosporin use.    . Sulfa Antibiotics Nausea And Vomiting    Current Outpatient Medications  Medication Sig Dispense Refill  . albuterol (PROVENTIL HFA;VENTOLIN HFA) 108 (90 Base) MCG/ACT inhaler Inhale 1-2 puffs into the lungs every 6 (six) hours as needed for wheezing or shortness of breath.    . B Complex Vitamins (VITAMIN B COMPLEX PO) Take 1 tablet by mouth daily.     . Biotin w/ Vitamins C & E (HAIR/SKIN/NAILS PO) Take 1 tablet by mouth daily.    . Calcium Carb-Cholecalciferol (CALCIUM 600+D) 600-800 MG-UNIT TABS Take 2 tablets by mouth daily.    . Cholecalciferol (D3 MAXIMUM  STRENGTH) 5000 units capsule Take 5,000 Units by mouth daily.    . COLLAGEN PO Take 1,000 mg by mouth daily.    . fexofenadine (ALLEGRA) 180 MG tablet Take 180 mg by mouth daily as needed for allergies.     . fluocinonide (LIDEX) 0.05 % external solution     . fluticasone (FLONASE) 50 MCG/ACT nasal spray Place 2 sprays into both nostrils daily as needed for allergies or rhinitis.    . Fluticasone-Salmeterol (ADVAIR) 250-50 MCG/DOSE AEPB Inhale 1 puff into the lungs 2 (two) times daily as needed (for respiratory issues.).    Marland Kitchen lansoprazole (PREVACID) 30 MG capsule Take 30 mg by mouth 2 (two) times daily before a meal.     . lidocaine-prilocaine (EMLA) cream Apply 1 application topically as needed. Apply generously over the Mediport 45 minutes prior to chemotherapy. 30 g 0  . losartan (COZAAR) 25 MG tablet Take 25 mg by mouth daily.     . meloxicam (MOBIC) 7.5 MG tablet Take 1 tablet by mouth 1 day or 1 dose.    . metoCLOPramide (REGLAN) 10 MG tablet Take 1 tablet (10 mg total) by mouth every 8 (eight) hours as needed for nausea. 40 tablet 3  . mometasone (ELOCON) 0.1 % lotion     . mupirocin ointment (BACTROBAN) 2 %   0  . ondansetron (ZOFRAN) 8 MG tablet Take 1 tablet (8 mg total) by mouth every 8 (eight) hours as needed for nausea or vomiting (start 3 days; after chemo). 40 tablet 1  . osimertinib mesylate (TAGRISSO) 80 MG tablet Take 1 tablet (80 mg total) by mouth daily. 30 tablet 5  . polyethylene glycol (MIRALAX / GLYCOLAX) packet Take 17 g by mouth daily as needed for mild constipation.    Marland Kitchen PREMARIN 1.25 MG tablet   1  . prochlorperazine (COMPAZINE) 10 MG tablet Take 1 tablet (10 mg total) by mouth every 6 (six) hours as needed for nausea or vomiting. 30 tablet 0  . pseudoephedrine-guaifenesin (MUCINEX D) 60-600 MG 12 hr tablet Take 1 tablet by mouth every 12 (twelve) hours.    . triamcinolone (KENALOG) 0.1 % paste Use as directed 1 application in the mouth or throat 2 (two) times daily. 5  g 12  . Vitamin D, Ergocalciferol, (DRISDOL) 50000 units CAPS capsule Take 50,000 Units by mouth every 7 (seven) days.      No current facility-administered medications for this visit.    Facility-Administered Medications Ordered in Other Visits  Medication Dose Route Frequency Provider Last Rate Last Dose  . heparin lock flush 100 unit/mL  500 Units Intravenous Once Lloyd Huger, MD      .  sodium chloride flush (NS) 0.9 % injection 10 mL  10 mL Intravenous PRN Lloyd Huger, MD      . sodium chloride flush (NS) 0.9 % injection 10 mL  10 mL Intravenous PRN Lloyd Huger, MD   10 mL at 01/22/18 1419    Review of Systems Review of Systems  Constitutional: Negative.   Respiratory: Negative.   Cardiovascular: Negative.     Blood pressure (!) 141/86, pulse 73, temperature 97.7 F (36.5 C), resp. rate 15, height 5\' 4"  (1.626 m), weight 131 lb 9.6 oz (59.7 kg), SpO2 100 %.  Physical Exam Physical Exam Exam conducted with a chaperone present.  Constitutional:      Appearance: She is well-developed.  Eyes:     General: No scleral icterus.    Conjunctiva/sclera: Conjunctivae normal.  Neck:     Musculoskeletal: Neck supple.  Cardiovascular:     Rate and Rhythm: Normal rate and regular rhythm.     Heart sounds: Normal heart sounds.  Pulmonary:     Effort: Pulmonary effort is normal.     Breath sounds: Normal breath sounds.     Comments: Rhonchi bilaterally.  Minimal clearing with cough. Chest:     Breasts:        Right: No inverted nipple, mass, nipple discharge, skin change or tenderness.        Left: Nipple discharge present. No inverted nipple, mass, skin change or tenderness.    Lymphadenopathy:     Cervical: No cervical adenopathy.     Upper Body:     Right upper body: No supraclavicular or axillary adenopathy.     Left upper body: No supraclavicular or axillary adenopathy.  Skin:    General: Skin is warm and dry.  Neurological:     Mental Status: She  is alert and oriented to person, place, and time.     Data Reviewed The September 14, 2018 bilateral diagnostic mammograms and left breast ultrasound were independently reviewed.  Dense breast consistent with petite body habitus.  No ultrasound abnormality reported.  CT scan of the chest of July 30, 2018 was compared to the September 27, 2017 exam.  Increased right pleural effusion.  Of note, on review of these images no abnormality within the left breast is appreciated.  Laboratory studies of October 25, 2018 showed a hemoglobin of 11.4 with an MCV of 90.6.  White blood cell count of 4100.  Platelet count of 211,000.  Comprehensive metabolic panel the same date is remarkable for nonfasting blood sugar of 112.  Otherwise normal.  Normal renal function.  Assessment Single duct bloody nipple drainage.  Plan Options for management reviewed: 1) proceed to MRI as recommended by radiology followed by excision versus 2) excisional biopsy of the involved duct and adjacent tissue.  Pros and cons of each approach reviewed.  I think with the fact that she has had drainage now for 4 months the likelihood of spontaneous resolution is small, and with her lung cancer currently stable, it is reasonable to proceed to excisional biopsy to determine if a second primary is noted.  Patient questioned whether additional antibiotics would be of benefit as she had appreciated some decrease in drainage with a previous course of antibiotics for respiratory infection.  I think this is unlikely to be a long-term solution and more likely will put her at risk of antibiotic resistance.  The patient is aware that "elective" surgery is on hold secondary to the [place] virus pandemic, and has asked to  take time to consider her options for management.  I think this is very reasonable.  She was encouraged to call if this office can be of assistance.    HPI, Physical Exam, Assessment and Plan have been scribed under the direction and  in the presence of Robert Bellow, MD  Concepcion Living, LPN  [facility] Porsha Skilton 11/07/2018, 7:21 PM

## 2018-11-14 ENCOUNTER — Other Ambulatory Visit: Payer: Self-pay | Admitting: General Surgery

## 2018-11-14 DIAGNOSIS — N6452 Nipple discharge: Secondary | ICD-10-CM

## 2018-11-19 ENCOUNTER — Other Ambulatory Visit: Payer: Self-pay

## 2018-11-19 ENCOUNTER — Ambulatory Visit
Admission: RE | Admit: 2018-11-19 | Discharge: 2018-11-19 | Disposition: A | Discharge status: Home / Self Care | Payer: Medicare Other | Source: Ambulatory Visit | Attending: General Surgery | Admitting: General Surgery

## 2018-11-19 DIAGNOSIS — N6452 Nipple discharge: Secondary | ICD-10-CM

## 2018-11-19 MED ORDER — GADOBUTROL 1 MMOL/ML IV SOLN
6.0000 mL | Freq: Once | INTRAVENOUS | Status: AC | PRN
  Administered 2018-11-19: 13:00:00 6 mL via INTRAVENOUS

## 2018-11-28 ENCOUNTER — Other Ambulatory Visit: Payer: Self-pay | Admitting: Internal Medicine

## 2018-11-28 DIAGNOSIS — R9389 Abnormal findings on diagnostic imaging of other specified body structures: Secondary | ICD-10-CM

## 2018-12-07 ENCOUNTER — Ambulatory Visit
Admission: RE | Admit: 2018-12-07 | Discharge: 2018-12-07 | Disposition: A | Discharge status: Home / Self Care | Payer: Medicare Other | Source: Ambulatory Visit | Attending: Internal Medicine | Admitting: Internal Medicine

## 2018-12-07 ENCOUNTER — Other Ambulatory Visit: Payer: Self-pay

## 2018-12-07 DIAGNOSIS — R9389 Abnormal findings on diagnostic imaging of other specified body structures: Secondary | ICD-10-CM

## 2018-12-07 MED ORDER — GADOBUTROL 1 MMOL/ML IV SOLN
6.0000 mL | Freq: Once | INTRAVENOUS | Status: AC | PRN
  Administered 2018-12-07: 11:00:00 6 mL via INTRAVENOUS

## 2018-12-13 ENCOUNTER — Other Ambulatory Visit: Payer: Self-pay

## 2018-12-13 ENCOUNTER — Ambulatory Visit: Payer: Medicare Other | Admitting: General Surgery

## 2018-12-13 ENCOUNTER — Ambulatory Visit (INDEPENDENT_AMBULATORY_CARE_PROVIDER_SITE_OTHER): Payer: Medicare Other | Admitting: General Surgery

## 2018-12-13 ENCOUNTER — Encounter: Payer: Self-pay | Admitting: General Surgery

## 2018-12-13 VITALS — BP 143/92 | HR 80 | Temp 97.9°F | Resp 16 | Ht 67.0 in | Wt 131.6 lb

## 2018-12-13 DIAGNOSIS — N6452 Nipple discharge: Secondary | ICD-10-CM | POA: Diagnosis not present

## 2018-12-13 NOTE — Progress Notes (Signed)
Patient ID: Lauren Page, female   DOB: 09-Aug-1950, 68 y.o.   MRN: [id number]  Chief Complaint  Patient presents with  . Follow-up    MRI    HPI Lauren Page is a 68 y.o. female.  Here today to discuss pathology results of left breast MRI guided biopsies completed on December 07, 2018.  The patient reports the procedure was significantly uncomfortable.  Has been using ice since the procedure.. Left breast has mild discomfort with bruising.  MRI had been recommended to evaluate for source of bloody ductal drainage which developed in January 2020.  This had prompted the patient to present to her PCP in March 2020.  Mammography and ultrasound have been negative.  She had been offered to proceed directly to duct excision but declined.  The patient reports she has not had any nipple drainage since the biopsy was completed.  HPI  Past Medical History:  Diagnosis Date  . Acid reflux   . Anemia    HAD TO HAVE IRON INFUSIONS BACK IN 2015  . Arthritis    bil hands and back  . Asthma   . Cancer of right lung ([place]) 2015   lung cancer - chemo, Dr Genevive Bi removed RML Lobectomy.   Lauren Page Dyspnea    WITH EXERTION  . Hypertension   . Mitral valve prolapse   . Personal history of chemotherapy    F/U Lung Cancer  . Personal history of radiation therapy    Lung cancer  . Primary cancer of right middle lobe of lung ([place]) 03/11/2016    Past Surgical History:  Procedure Laterality Date  . ABDOMINAL HYSTERECTOMY    . BREAST EXCISIONAL BIOPSY Left 1994 (-), 2004 (-)   benign  . COLONOSCOPY  2008    Dr. Donnella Sham  . KNEE SURGERY  2007  . LUNG LOBECTOMY Right    middle lobe  . PORTACATH PLACEMENT Right 02/09/2017   Procedure: INSERTION PORT-A-CATH;  Surgeon: Nestor Lewandowsky, MD;  Location: [facility] ORS;  Service: General;  Laterality: Right;  . SALIVARY GLAND SURGERY Left 1983   with lymph node removal also  . UPPER GI ENDOSCOPY  2008  . VIDEO BRONCHOSCOPY WITH ENDOBRONCHIAL ULTRASOUND Right 01/24/2017   Procedure:  VIDEO BRONCHOSCOPY WITH ENDOBRONCHIAL ULTRASOUND;  Surgeon: [name], MD;  Location: [facility] ORS;  Service: Pulmonary;  Laterality: Right;    Family History  Problem Relation Age of Onset  . Breast cancer Maternal Aunt   . Breast cancer Paternal Aunt   . Breast cancer Maternal Grandmother   . Breast cancer Maternal Aunt   . Breast cancer Paternal Aunt   . Healthy Mother   . Healthy Father     Social History Social History   Tobacco Use  . Smoking status: Never Smoker  . Smokeless tobacco: Never Used  Substance Use Topics  . Alcohol use: Yes    Alcohol/week: 7.0 standard drinks    Types: 7 Glasses of wine per week  . Drug use: No    Allergies  Allergen Reactions  . Penicillins Swelling and Rash    Has patient had a PCN reaction causing immediate rash, facial/tongue/throat swelling, SOB or lightheadedness with hypotension: Unknown Has patient had a PCN reaction causing severe rash involving mucus membranes or skin necrosis: Unknown Has patient had a PCN reaction that required hospitalization: No Has patient had a PCN reaction occurring within the last 10 years: No If all of the above answers are "NO", then may proceed with Cephalosporin use.    Lauren Page  Sulfa Antibiotics Nausea And Vomiting    Current Outpatient Medications  Medication Sig Dispense Refill  . albuterol (PROVENTIL HFA;VENTOLIN HFA) 108 (90 Base) MCG/ACT inhaler Inhale 1-2 puffs into the lungs every 6 (six) hours as needed for wheezing or shortness of breath.    . B Complex Vitamins (VITAMIN B COMPLEX PO) Take 1 tablet by mouth daily.     . Biotin w/ Vitamins C & E (HAIR/SKIN/NAILS PO) Take 1 tablet by mouth daily.    . Calcium Carb-Cholecalciferol (CALCIUM 600+D) 600-800 MG-UNIT TABS Take 2 tablets by mouth daily.    . Cholecalciferol (D3 MAXIMUM STRENGTH) 5000 units capsule Take 5,000 Units by mouth daily.    . COLLAGEN PO Take 1,000 mg by mouth daily.    . fexofenadine (ALLEGRA) 180 MG tablet Take 180  mg by mouth daily as needed for allergies.     . fluocinonide (LIDEX) 0.05 % external solution     . fluticasone (FLONASE) 50 MCG/ACT nasal spray Place 2 sprays into both nostrils daily as needed for allergies or rhinitis.    . Fluticasone-Salmeterol (ADVAIR) 250-50 MCG/DOSE AEPB Inhale 1 puff into the lungs 2 (two) times daily as needed (for respiratory issues.).    Lauren Page lansoprazole (PREVACID) 30 MG capsule Take 30 mg by mouth 2 (two) times daily before a meal.     . lidocaine-prilocaine (EMLA) cream Apply 1 application topically as needed. Apply generously over the Mediport 45 minutes prior to chemotherapy. 30 g 0  . losartan (COZAAR) 25 MG tablet Take 25 mg by mouth daily.     . meloxicam (MOBIC) 7.5 MG tablet Take 1 tablet by mouth 1 day or 1 dose.    . metoCLOPramide (REGLAN) 10 MG tablet Take 1 tablet (10 mg total) by mouth every 8 (eight) hours as needed for nausea. 40 tablet 3  . mometasone (ELOCON) 0.1 % lotion     . mupirocin ointment (BACTROBAN) 2 %   0  . ondansetron (ZOFRAN) 8 MG tablet Take 1 tablet (8 mg total) by mouth every 8 (eight) hours as needed for nausea or vomiting (start 3 days; after chemo). 40 tablet 1  . osimertinib mesylate (TAGRISSO) 80 MG tablet Take 1 tablet (80 mg total) by mouth daily. 30 tablet 5  . polyethylene glycol (MIRALAX / GLYCOLAX) packet Take 17 g by mouth daily as needed for mild constipation.    Lauren Page PREMARIN 1.25 MG tablet   1  . prochlorperazine (COMPAZINE) 10 MG tablet Take 1 tablet (10 mg total) by mouth every 6 (six) hours as needed for nausea or vomiting. 30 tablet 0  . pseudoephedrine-guaifenesin (MUCINEX D) 60-600 MG 12 hr tablet Take 1 tablet by mouth every 12 (twelve) hours.    . triamcinolone (KENALOG) 0.1 % paste Use as directed 1 application in the mouth or throat 2 (two) times daily. 5 g 12  . Vitamin D, Ergocalciferol, (DRISDOL) 50000 units CAPS capsule Take 50,000 Units by mouth every 7 (seven) days.      No current facility-administered  medications for this visit.    Facility-Administered Medications Ordered in Other Visits  Medication Dose Route Frequency Provider Last Rate Last Dose  . heparin lock flush 100 unit/mL  500 Units Intravenous Once Lloyd Huger, MD      . sodium chloride flush (NS) 0.9 % injection 10 mL  10 mL Intravenous PRN Lloyd Huger, MD      . sodium chloride flush (NS) 0.9 % injection 10 mL  10 mL  Intravenous PRN Lloyd Huger, MD   10 mL at 01/22/18 1419    Review of Systems Review of Systems  Constitutional: Negative.   Respiratory: Negative.     Blood pressure (!) 143/92, pulse 80, temperature 97.9 F (36.6 C), temperature source Temporal, resp. rate 16, height 5\' 7"  (1.702 m), weight 131 lb 9.6 oz (59.7 kg), SpO2 98 %.  Physical Exam Physical Exam Exam conducted with a chaperone present.  Constitutional:      Appearance: She is well-developed.  Eyes:     General: No scleral icterus.    Conjunctiva/sclera: Conjunctivae normal.  Neck:     Musculoskeletal: Normal range of motion and neck supple.  Cardiovascular:     Rate and Rhythm: Normal rate and regular rhythm.     Heart sounds: Normal heart sounds.  Pulmonary:     Effort: Pulmonary effort is normal.     Breath sounds: Normal breath sounds.  Chest:     Breasts:        Left: Skin change present. No inverted nipple, mass, nipple discharge or tenderness.  Lymphadenopathy:     Cervical: No cervical adenopathy.  Skin:    General: Skin is warm and dry.  Neurological:     Mental Status: She is alert and oriented to person, place, and time.  Psychiatric:        Behavior: Behavior normal.     Data Reviewed  Diagnosis 1. Breast, left, needle core biopsy, posterior central retroareolar - FIBROCYSTIC CHANGES TO INCLUDE A PREDOMINANCE OF STROMAL FIBROSIS. 2. Breast, left, needle core biopsy, anterior retroareolar - FIBROCYSTIC CHANGES TO INCLUDE A PREDOMINANCE OF STROMAL FIBROSIS.Pathology:  Radiologist  interpretation: Pathology revealed FIBROCYSTIC CHANGES TO INCLUDE A PREDOMINANCE OF STROMAL FIBROSIS of the Left breast, both locations, posterior central retroareolar and anterior retroareolar. This was found to be discordant by Dr. Abelardo Diesel, with excision of both areas Recommended.  Postbiopsy mammogram of December 07, 2018 was independently reviewed.  The biopsy clips are noted to be within the central portion of the breast directly behind the nipple.  Assessment Negative breast biopsy x2 for MRI abnormality.  History of bloody nipple drainage, temporarily resolved.  Significant ecchymosis involving approximately 40% of the breast volume.  Plan  Please apply heat to the area twice daily to accelerate resolution of the ecchymosis. Please see your follow up appointment listed below.  The patient will likely benefit from excision of these areas, but I would defer until the hematoma/ecchymosis has resolved to minimize the resection of normal tissue.  We will plan on a follow-up examination in 1 month.  HPI, Physical Exam, Assessment and Plan have been scribed under the direction and in the presence of Robert Bellow, MD. Jonnie Finner, CMA HPI, assessment, plan and physical exam has been scribed under the direction and in the presence of Robert Bellow, MD. Karie Fetch, RN   I have completed the exam and reviewed the above documentation for accuracy and completeness.  I agree with the above.  Haematologist has been used and any errors in dictation or transcription are unintentional.  Hervey Ard, M.D., F.A.C.S.  [facility] [place] [place] 12/16/2018, 1:52 PM

## 2018-12-13 NOTE — Patient Instructions (Addendum)
The patient is aware to call back for any questions or new concerns. Please apply heat to the area twice daily. Please see your follow up appointment listed below.

## 2019-01-02 ENCOUNTER — Other Ambulatory Visit: Payer: Self-pay | Admitting: Oncology

## 2019-01-02 DIAGNOSIS — C342 Malignant neoplasm of middle lobe, bronchus or lung: Secondary | ICD-10-CM

## 2019-01-02 MED ORDER — OSIMERTINIB MESYLATE 80 MG PO TABS: 80.0000 mg | ORAL_TABLET | Freq: Every day | ORAL | 5 refills | Status: DC

## 2019-01-07 ENCOUNTER — Telehealth: Payer: Self-pay | Admitting: Internal Medicine

## 2019-01-07 NOTE — Telephone Encounter (Signed)

## 2019-01-08 ENCOUNTER — Ambulatory Visit (INDEPENDENT_AMBULATORY_CARE_PROVIDER_SITE_OTHER): Payer: Medicare Other | Admitting: Internal Medicine

## 2019-01-08 ENCOUNTER — Other Ambulatory Visit: Payer: Self-pay

## 2019-01-08 ENCOUNTER — Encounter: Payer: Self-pay | Admitting: Internal Medicine

## 2019-01-08 VITALS — BP 140/70 | HR 80 | Temp 97.6°F | Ht 67.0 in | Wt 133.2 lb

## 2019-01-08 DIAGNOSIS — J449 Chronic obstructive pulmonary disease, unspecified: Secondary | ICD-10-CM

## 2019-01-08 NOTE — Progress Notes (Signed)
[facility] Consultation    Update: Pt seen and evaluated before pt going for procedure, new changes in exam have been noted.     Date: 01/08/2019  MRN# [id number] Lauren Page [birth year]    Lauren Page is a 68 y.o. old female seen in consultation for chief complaint of:     PREVIOUS HPI 68 year old woman who is status post right middle lobectomy several years ago. She is now about 2-1/2 years out from a right thoracotomy and right middle lobectomy for a adenocarcinoma the lung with pleural invasion, she then completed 4 cycles of chemotherapy on January 23, 2014   About a year ago she did have a small nodule identified along the superior segment right lower lobe close to the area of the fissure, she was found to have a nodular density in the superior segment of the right lower lobe. A subsequent PET scan was performed which revealed no evidence of uptake in the hypermetabolic range.   A repeat CT scan July 2018 There are some bilateral hilar adenopathy which is increased slightly in size.  The largest lymph node now measures over 1.5 cm in the right hilum. The left hilum has a 9 mm lymph node. These have increased from before.   EBUS shows recurrence Of Adenocarcioma-underwent Chemo/RXT  CT scans subsequently showed RT lung pneumonitis from RXT  3.20.19 scans show lung damage and effusion RT sided  Patient also has increased WOB and SOB and cough CT shows pleural effusion as well US guided thoracentesis performed-+malgnant cells with adenocarcinoma  CC Follow up SOB Follow up COPD   HPI COPD is stable at this time Chronic shortness of breath and dyspnea exertion seems to be stable He does not take Advair on a daily basis She uses Advair as needed and every other day Uses albuterol as needed   No signs of infection at this time She is very active and exercises No evidence of respiratory compromise at this time  No indication for steroids or  antibiotics at this time    Medication:    Current Outpatient Medications:  .  albuterol (PROVENTIL HFA;VENTOLIN HFA) 108 (90 Base) MCG/ACT inhaler, Inhale 1-2 puffs into the lungs every 6 (six) hours as needed for wheezing or shortness of breath., Disp: , Rfl:  .  B Complex Vitamins (VITAMIN B COMPLEX PO), Take 1 tablet by mouth daily. , Disp: , Rfl:  .  Biotin w/ Vitamins C & E (HAIR/SKIN/NAILS PO), Take 1 tablet by mouth daily., Disp: , Rfl:  .  Calcium Carb-Cholecalciferol (CALCIUM 600+D) 600-800 MG-UNIT TABS, Take 2 tablets by mouth daily., Disp: , Rfl:  .  Cholecalciferol (D3 MAXIMUM STRENGTH) 5000 units capsule, Take 5,000 Units by mouth daily., Disp: , Rfl:  .  COLLAGEN PO, Take 1,000 mg by mouth daily., Disp: , Rfl:  .  fexofenadine (ALLEGRA) 180 MG tablet, Take 180 mg by mouth daily as needed for allergies. , Disp: , Rfl:  .  fluocinonide (LIDEX) 0.05 % external solution, , Disp: , Rfl:  .  fluticasone (FLONASE) 50 MCG/ACT nasal spray, Place 2 sprays into both nostrils daily as needed for allergies or rhinitis., Disp: , Rfl:  .  Fluticasone-Salmeterol (ADVAIR) 250-50 MCG/DOSE AEPB, Inhale 1 puff into the lungs 2 (two) times daily as needed (for respiratory issues.)., Disp: , Rfl:  .  lansoprazole (PREVACID) 30 MG capsule, Take 30 mg by mouth 2 (two) times daily before a meal. , Disp: , Rfl:  .  lidocaine-prilocaine (EMLA) cream, Apply 1 application topically as needed. Apply generously over the Mediport 45 minutes prior to chemotherapy., Disp: 30 g, Rfl: 0 .  losartan (COZAAR) 25 MG tablet, Take 25 mg by mouth daily. , Disp: , Rfl:  .  meloxicam (MOBIC) 7.5 MG tablet, Take 1 tablet by mouth 1 day or 1 dose., Disp: , Rfl:  .  metoCLOPramide (REGLAN) 10 MG tablet, Take 1 tablet (10 mg total) by mouth every 8 (eight) hours as needed for nausea., Disp: 40 tablet, Rfl: 3 .  mometasone (ELOCON) 0.1 % lotion, , Disp: , Rfl:  .  mupirocin ointment (BACTROBAN) 2 %, , Disp: , Rfl: 0 .   ondansetron (ZOFRAN) 8 MG tablet, Take 1 tablet (8 mg total) by mouth every 8 (eight) hours as needed for nausea or vomiting (start 3 days; after chemo)., Disp: 40 tablet, Rfl: 1 .  osimertinib mesylate (TAGRISSO) 80 MG tablet, Take 1 tablet (80 mg total) by mouth daily., Disp: 30 tablet, Rfl: 5 .  polyethylene glycol (MIRALAX / GLYCOLAX) packet, Take 17 g by mouth daily as needed for mild constipation., Disp: , Rfl:  .  PREMARIN 1.25 MG tablet, , Disp: , Rfl: 1 .  prochlorperazine (COMPAZINE) 10 MG tablet, Take 1 tablet (10 mg total) by mouth every 6 (six) hours as needed for nausea or vomiting., Disp: 30 tablet, Rfl: 0 .  pseudoephedrine-guaifenesin (MUCINEX D) 60-600 MG 12 hr tablet, Take 1 tablet by mouth every 12 (twelve) hours., Disp: , Rfl:  .  triamcinolone (KENALOG) 0.1 % paste, Use as directed 1 application in the mouth or throat 2 (two) times daily., Disp: 5 g, Rfl: 12 .  Vitamin D, Ergocalciferol, (DRISDOL) 50000 units CAPS capsule, Take 50,000 Units by mouth every 7 (seven) days. , Disp: , Rfl:  No current facility-administered medications for this visit.   Facility-Administered Medications Ordered in Other Visits:  .  heparin lock flush 100 unit/mL, 500 Units, Intravenous, Once, Finnegan, Kathlene November, MD .  sodium chloride flush (NS) 0.9 % injection 10 mL, 10 mL, Intravenous, PRN, Grayland Ormond, Kathlene November, MD .  sodium chloride flush (NS) 0.9 % injection 10 mL, 10 mL, Intravenous, PRN, Lloyd Huger, MD, 10 mL at 01/22/18 1419   Allergies:  Penicillins and Sulfa antibiotics   Review of Systems:  Gen:  Denies  fever, sweats, chills weigh loss  HEENT: Denies blurred vision, double vision, ear pain, eye pain, hearing loss, nose bleeds, sore throat Cardiac:  No dizziness, chest pain or heaviness, chest tightness,edema, No JVD Resp:   No cough, -sputum production, +shortness of breath,-wheezing, -hemoptysis,  Gi: Denies swallowing difficulty, stomach pain, nausea or vomiting,  diarrhea, constipation, bowel incontinence Gu:  Denies bladder incontinence, burning urine Ext:   Denies Joint pain, stiffness or swelling Skin: Denies  skin rash, easy bruising or bleeding or hives Endoc:  Denies polyuria, polydipsia , polyphagia or weight change Psych:   Denies depression, insomnia or hallucinations  Other:  All other systems negative       BP 140/70 (BP Location: Left Arm, Cuff Size: Normal)   Pulse 80   Temp 97.6 F (36.4 C) (Temporal)   Ht 5\' 7"  (1.702 m)   Wt 133 lb 3.2 oz (60.4 kg)   SpO2 99%   BMI 20.86 kg/m   Physical Examination:   GENERAL:NAD, no fevers, chills, no weakness no fatigue HEAD: Normocephalic, atraumatic.  EYES: PERLA, EOMI No scleral icterus.  NECK: Supple. No thyromegaly.  No JVD.  PULMONARY:  CTA B/L no wheezing, rhonchi, crackles CARDIOVASCULAR: S1 and S2. Regular rate and rhythm. No murmurs GASTROINTESTINAL: Soft, nontender, nondistended. Positive bowel sounds.  MUSCULOSKELETAL: No swelling, clubbing, or edema.  NEUROLOGIC: No gross focal neurological deficits. 5/5 strength all extremities SKIN: No ulceration, lesions, rashes, or cyanosis.  PSYCHIATRIC: Insight, judgment intact. 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  The importance of social distancing was discussed today. Hand Washing Techniques and avoid touching face was advised.   MEDICATION ADJUSTMENTS/LABS AND TESTS ORDERED: Albuterol as needed   CURRENT MEDICATIONS REVIEWED AT LENGTH WITH PATIENT TODAY   Patient satisfied with Plan of action and management. All questions answered  Follow up in 6 months   Montravious Weigelt Patricia Pesa, M.D.  [facility]  Medical Director [place] Director [facility] Cardio-Pulmonary Department

## 2019-01-08 NOTE — Patient Instructions (Signed)
Continue Advair and Albuterol as needed Follow up with Dr Grayland Ormond

## 2019-01-10 ENCOUNTER — Encounter: Payer: Self-pay | Admitting: General Surgery

## 2019-01-10 ENCOUNTER — Other Ambulatory Visit: Payer: Self-pay

## 2019-01-10 ENCOUNTER — Ambulatory Visit (INDEPENDENT_AMBULATORY_CARE_PROVIDER_SITE_OTHER): Payer: Medicare Other | Admitting: General Surgery

## 2019-01-10 VITALS — BP 135/88 | HR 76 | Temp 97.4°F | Ht 67.0 in | Wt 132.0 lb

## 2019-01-10 DIAGNOSIS — N6452 Nipple discharge: Secondary | ICD-10-CM | POA: Diagnosis not present

## 2019-01-10 MED ORDER — MELOXICAM 7.5 MG PO TABS: 7.5000 mg | ORAL_TABLET | Freq: Every day | ORAL | 0 refills | Status: DC

## 2019-01-10 NOTE — Patient Instructions (Signed)
Patient to use heat. Return in one month. RX sent  The patient is aware to call back for any questions or new concerns.

## 2019-01-10 NOTE — Progress Notes (Signed)
Patient ID: Lauren Page, female   DOB: 1951/05/27, 68 y.o.   MRN: [id number]  Chief Complaint  Patient presents with  . Follow-up    discuss surgery    HPI Lauren Page is a 68 y.o. female here today to discuss left breast surgery.   Past Medical History:  Diagnosis Date  . Acid reflux   . Anemia    HAD TO HAVE IRON INFUSIONS BACK IN 2015  . Arthritis    bil hands and back  . Asthma   . Cancer of right lung ([place]) 2015   lung cancer - chemo, Dr Genevive Bi removed RML Lobectomy.   Marland Kitchen Dyspnea    WITH EXERTION  . Hypertension   . Mitral valve prolapse   . Personal history of chemotherapy    F/U Lung Cancer  . Personal history of radiation therapy    Lung cancer  . Primary cancer of right middle lobe of lung ([place]) 03/11/2016    Past Surgical History:  Procedure Laterality Date  . ABDOMINAL HYSTERECTOMY    . BREAST EXCISIONAL BIOPSY Left 1994 (-), 2004 (-)   benign  . COLONOSCOPY  2008    Dr. Donnella Sham  . KNEE SURGERY  2007  . LUNG LOBECTOMY Right    middle lobe  . PORTACATH PLACEMENT Right 02/09/2017   Procedure: INSERTION PORT-A-CATH;  Surgeon: Nestor Lewandowsky, MD;  Location: [facility] ORS;  Service: General;  Laterality: Right;  . SALIVARY GLAND SURGERY Left 1983   with lymph node removal also  . UPPER GI ENDOSCOPY  2008  . VIDEO BRONCHOSCOPY WITH ENDOBRONCHIAL ULTRASOUND Right 01/24/2017   Procedure: VIDEO BRONCHOSCOPY WITH ENDOBRONCHIAL ULTRASOUND;  Surgeon: [name], MD;  Location: [facility] ORS;  Service: Pulmonary;  Laterality: Right;    Family History  Problem Relation Age of Onset  . Breast cancer Maternal Aunt   . Breast cancer Paternal Aunt   . Breast cancer Maternal Grandmother   . Breast cancer Maternal Aunt   . Breast cancer Paternal Aunt   . Healthy Mother   . Healthy Father     Social History Social History   Tobacco Use  . Smoking status: Never Smoker  . Smokeless tobacco: Never Used  Substance Use Topics  . Alcohol use: Yes    Alcohol/week: 7.0  standard drinks    Types: 7 Glasses of wine per week  . Drug use: No    Allergies  Allergen Reactions  . Penicillins Swelling and Rash    Has patient had a PCN reaction causing immediate rash, facial/tongue/throat swelling, SOB or lightheadedness with hypotension: Unknown Has patient had a PCN reaction causing severe rash involving mucus membranes or skin necrosis: Unknown Has patient had a PCN reaction that required hospitalization: No Has patient had a PCN reaction occurring within the last 10 years: No If all of the above answers are "NO", then may proceed with Cephalosporin use.    . Sulfa Antibiotics Nausea And Vomiting    Current Outpatient Medications  Medication Sig Dispense Refill  . albuterol (PROVENTIL HFA;VENTOLIN HFA) 108 (90 Base) MCG/ACT inhaler Inhale 1-2 puffs into the lungs every 6 (six) hours as needed for wheezing or shortness of breath.    . B Complex Vitamins (VITAMIN B COMPLEX PO) Take 1 tablet by mouth daily.     . Biotin w/ Vitamins C & E (HAIR/SKIN/NAILS PO) Take 1 tablet by mouth daily.    . Calcium Carb-Cholecalciferol (CALCIUM 600+D) 600-800 MG-UNIT TABS Take 2 tablets by mouth daily.    Marland Kitchen  Cholecalciferol (D3 MAXIMUM STRENGTH) 5000 units capsule Take 5,000 Units by mouth daily.    . COLLAGEN PO Take 1,000 mg by mouth daily.    . fexofenadine (ALLEGRA) 180 MG tablet Take 180 mg by mouth daily as needed for allergies.     . fluocinonide (LIDEX) 0.05 % external solution     . fluticasone (FLONASE) 50 MCG/ACT nasal spray Place 2 sprays into both nostrils daily as needed for allergies or rhinitis.    . Fluticasone-Salmeterol (ADVAIR) 250-50 MCG/DOSE AEPB Inhale 1 puff into the lungs 2 (two) times daily as needed (for respiratory issues.).    Marland Kitchen lansoprazole (PREVACID) 30 MG capsule Take 30 mg by mouth 2 (two) times daily before a meal.     . lidocaine-prilocaine (EMLA) cream Apply 1 application topically as needed. Apply generously over the Mediport 45 minutes  prior to chemotherapy. 30 g 0  . losartan (COZAAR) 25 MG tablet Take 25 mg by mouth daily.     . metoCLOPramide (REGLAN) 10 MG tablet Take 1 tablet (10 mg total) by mouth every 8 (eight) hours as needed for nausea. 40 tablet 3  . mometasone (ELOCON) 0.1 % lotion     . mupirocin ointment (BACTROBAN) 2 %   0  . ondansetron (ZOFRAN) 8 MG tablet Take 1 tablet (8 mg total) by mouth every 8 (eight) hours as needed for nausea or vomiting (start 3 days; after chemo). 40 tablet 1  . osimertinib mesylate (TAGRISSO) 80 MG tablet Take 1 tablet (80 mg total) by mouth daily. 30 tablet 5  . polyethylene glycol (MIRALAX / GLYCOLAX) packet Take 17 g by mouth daily as needed for mild constipation.    Marland Kitchen PREMARIN 1.25 MG tablet   1  . prochlorperazine (COMPAZINE) 10 MG tablet Take 1 tablet (10 mg total) by mouth every 6 (six) hours as needed for nausea or vomiting. 30 tablet 0  . pseudoephedrine-guaifenesin (MUCINEX D) 60-600 MG 12 hr tablet Take 1 tablet by mouth every 12 (twelve) hours.    . triamcinolone (KENALOG) 0.1 % paste Use as directed 1 application in the mouth or throat 2 (two) times daily. 5 g 12  . Vitamin D, Ergocalciferol, (DRISDOL) 50000 units CAPS capsule Take 50,000 Units by mouth every 7 (seven) days.     . meloxicam (MOBIC) 7.5 MG tablet Take 1 tablet by mouth 1 day or 1 dose.    . meloxicam (MOBIC) 7.5 MG tablet Take 1 tablet (7.5 mg total) by mouth daily. 30 tablet 0   No current facility-administered medications for this visit.    Facility-Administered Medications Ordered in Other Visits  Medication Dose Route Frequency Provider Last Rate Last Dose  . heparin lock flush 100 unit/mL  500 Units Intravenous Once Lloyd Huger, MD      . sodium chloride flush (NS) 0.9 % injection 10 mL  10 mL Intravenous PRN Lloyd Huger, MD      . sodium chloride flush (NS) 0.9 % injection 10 mL  10 mL Intravenous PRN Lloyd Huger, MD   10 mL at 01/22/18 1419    Review of Systems Review  of Systems  Constitutional: Negative.   Respiratory: Negative.   Cardiovascular: Negative.     Blood pressure 135/88, pulse 76, temperature (!) 97.4 F (36.3 C), temperature source Skin, height 5\' 7"  (1.702 m), weight 132 lb (59.9 kg), SpO2 100 %.  Physical Exam Physical Exam Exam conducted with a chaperone present.  Constitutional:      Appearance:  She is well-developed.  Eyes:     General: No scleral icterus.    Conjunctiva/sclera: Conjunctivae normal.  Neck:     Musculoskeletal: Neck supple.  Cardiovascular:     Rate and Rhythm: Normal rate and regular rhythm.     Heart sounds: Normal heart sounds.  Pulmonary:     Effort: Pulmonary effort is normal.     Breath sounds: Normal breath sounds.  Chest:     Breasts:        Right: No inverted nipple, mass, nipple discharge, skin change or tenderness.        Left: No inverted nipple, mass, nipple discharge, skin change or tenderness.    Lymphadenopathy:     Cervical: No cervical adenopathy.     Upper Body:     Right upper body: No supraclavicular or axillary adenopathy.     Left upper body: No supraclavicular or axillary adenopathy.  Skin:    General: Skin is warm and dry.  Neurological:     Mental Status: She is alert and oriented to person, place, and time.  Psychiatric:        Mood and Affect: Mood normal.     Data Reviewed December 10, 2018 left breast MRI directed biopsies: 1. Breast, left, needle core biopsy, posterior central retroareolar - FIBROCYSTIC CHANGES TO INCLUDE A PREDOMINANCE OF STROMAL FIBROSIS. 2. Breast, left, needle core biopsy, anterior retroareolar - FIBROCYSTIC CHANGES TO INCLUDE A PREDOMINANCE OF STROMAL FIBROSIS.Pathology  Assessment Slow resolution of postbiopsy hematoma.  Plan  The nipple drainage may have stopped simply due to swelling of the adjacent soft tissue around the ducts.  The radiologist felt the biopsy results were discordant.  Both post biopsy clips are in the immediate  retroareolar area in the central breast.  I think there is little likelihood of malignancy, but with the patient's history of lung cancer, she is somewhat concerned.  I think another month of observation would be warranted with efforts to help resolve the inflammatory changes with the use of local heat and resumption of Mobic which she has tolerated well in the past.  Patient is at this time will result in less tissue being resected as collateral damage to the inflammatory changes post biopsy.  Patient to use heat. Return in one month. RX sent  The patient is aware to call back for any questions or new concerns.   HPI, Physical Exam, Assessment and Plan have been scribed under the direction and in the presence of Hervey Ard, MD.  Gaspar Cola, CMA  I have completed the exam and reviewed the above documentation for accuracy and completeness.  I agree with the above.  Haematologist has been used and any errors in dictation or transcription are unintentional.  Hervey Ard, M.D., F.A.C.S.  Lauren Page 01/10/2019, 7:45 PM

## 2019-01-11 ENCOUNTER — Telehealth: Payer: Self-pay | Admitting: *Deleted

## 2019-01-11 MED ORDER — DOXYCYCLINE HYCLATE 100 MG PO TABS: 100.0000 mg | ORAL_TABLET | Freq: Two times a day (BID) | ORAL | 0 refills | Status: DC

## 2019-01-11 NOTE — Telephone Encounter (Signed)
doxyclicine 100mg  BID x 5days

## 2019-01-11 NOTE — Telephone Encounter (Signed)
Patient called and reports that at her last visit her finger nails were sore and that it was decided that since she had an appointment with dermatology the next day to let them handle it. She saw Dr Nehemiah Massed who ordered 2 creams for her to use which helped for a while, but now her fingers are sore and her husband looked something up on the interrnet and told her that she needs antibiotics She wants something sent in [place] but not to the hospital pharmacy, to [facility] in [place] and hopes that by Monday she will be better. Please advise

## 2019-01-11 NOTE — Telephone Encounter (Signed)
If this request is not done before Monday, then medicine should be sent to [facility]

## 2019-01-11 NOTE — Telephone Encounter (Signed)
Patient informed of prescription sent to pharmacy

## 2019-01-12 LAB — HISTORICAL SURGICAL PATHOLOGY SPECIMEN

## 2019-01-14 ENCOUNTER — Encounter: Payer: Self-pay | Admitting: Oncology

## 2019-01-28 ENCOUNTER — Telehealth: Payer: Self-pay | Admitting: General Surgery

## 2019-01-28 ENCOUNTER — Inpatient Hospital Stay: Payer: Medicare Other | Attending: Oncology

## 2019-01-28 ENCOUNTER — Other Ambulatory Visit: Payer: Self-pay

## 2019-01-28 ENCOUNTER — Ambulatory Visit: Payer: Medicare Other

## 2019-01-28 DIAGNOSIS — Z95828 Presence of other vascular implants and grafts: Secondary | ICD-10-CM

## 2019-01-28 DIAGNOSIS — R5383 Other fatigue: Secondary | ICD-10-CM | POA: Diagnosis not present

## 2019-01-28 DIAGNOSIS — C342 Malignant neoplasm of middle lobe, bronchus or lung: Secondary | ICD-10-CM | POA: Diagnosis not present

## 2019-01-28 DIAGNOSIS — C3490 Malignant neoplasm of unspecified part of unspecified bronchus or lung: Secondary | ICD-10-CM

## 2019-01-28 DIAGNOSIS — R197 Diarrhea, unspecified: Secondary | ICD-10-CM | POA: Diagnosis not present

## 2019-01-28 DIAGNOSIS — D649 Anemia, unspecified: Secondary | ICD-10-CM | POA: Insufficient documentation

## 2019-01-28 LAB — CBC WITH DIFFERENTIAL/PLATELET
Abs Immature Granulocytes: 0.02 10*3/uL (ref 0.00–0.07)
Basophils Absolute: 0 10*3/uL (ref 0.0–0.1)
Basophils Relative: 1 %
Eosinophils Absolute: 0.1 10*3/uL (ref 0.0–0.5)
Eosinophils Relative: 3 %
HCT: 31.7 % — ABNORMAL LOW (ref 36.0–46.0)
Hemoglobin: 10.6 g/dL — ABNORMAL LOW (ref 12.0–15.0)
Immature Granulocytes: 0 %
Lymphocytes Relative: 16 %
Lymphs Abs: 0.7 10*3/uL (ref 0.7–4.0)
MCH: 29.9 pg (ref 26.0–34.0)
MCHC: 33.4 g/dL (ref 30.0–36.0)
MCV: 89.3 fL (ref 80.0–100.0)
Monocytes Absolute: 0.4 10*3/uL (ref 0.1–1.0)
Monocytes Relative: 8 %
Neutro Abs: 3.4 10*3/uL (ref 1.7–7.7)
Neutrophils Relative %: 72 %
Platelets: 183 10*3/uL (ref 150–400)
RBC: 3.55 MIL/uL — ABNORMAL LOW (ref 3.87–5.11)
RDW: 14.6 % (ref 11.5–15.5)
WBC: 4.7 10*3/uL (ref 4.0–10.5)
nRBC: 0 % (ref 0.0–0.2)

## 2019-01-28 LAB — COMPREHENSIVE METABOLIC PANEL
ALT: 29 U/L (ref 0–44)
AST: 34 U/L (ref 15–41)
Albumin: 3.5 g/dL (ref 3.5–5.0)
Alkaline Phosphatase: 64 U/L (ref 38–126)
Anion gap: 8 (ref 5–15)
BUN: 18 mg/dL (ref 8–23)
CO2: 24 mmol/L (ref 22–32)
Calcium: 8.6 mg/dL — ABNORMAL LOW (ref 8.9–10.3)
Chloride: 103 mmol/L (ref 98–111)
Creatinine, Ser: 0.79 mg/dL (ref 0.44–1.00)
GFR calc Af Amer: >60 mL/min (ref >60)
GFR calc non Af Amer: >60 mL/min (ref >60)
Glucose, Bld: 97 mg/dL (ref 70–99)
Potassium: 4.1 mmol/L (ref 3.5–5.1)
Sodium: 135 mmol/L (ref 135–145)
Total Bilirubin: 0.5 mg/dL (ref 0.3–1.2)
Total Protein: 6.5 g/dL (ref 6.5–8.1)

## 2019-01-28 LAB — MAGNESIUM: Magnesium: 2.2 mg/dL (ref 1.7–2.4)

## 2019-01-28 MED ORDER — HEPARIN SOD (PORK) LOCK FLUSH 100 UNIT/ML IV SOLN: 500.0000 [IU] | Freq: Once | INTRAVENOUS | Status: DC

## 2019-01-28 MED ORDER — SODIUM CHLORIDE 0.9% FLUSH
10.0000 mL | Freq: Once | INTRAVENOUS | Status: DC
  Filled 2019-01-28: qty 10

## 2019-01-28 NOTE — Telephone Encounter (Signed)
Patient called and said she would call back to r/s with one of the providers but did want to schedule at this time.

## 2019-01-29 LAB — THYROID PANEL WITH TSH
Free Thyroxine Index: 1.2 (ref 1.2–4.9)
T3 Uptake Ratio: 19 % — ABNORMAL LOW (ref 24–39)
T4, Total: 6.3 ug/dL (ref 4.5–12.0)
TSH: 1.82 u[IU]/mL (ref 0.450–4.500)

## 2019-02-03 NOTE — Progress Notes (Signed)
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  INTERVAL HISTORY: Patient returns to clinic today for repeat laboratory work and further evaluation.  She did not have her restaging CT scan as scheduled.  She recently had a breast biopsy that revealed benign disease, but recommendation was to proceed with surgical resection anyway.  She is no longer having bloody discharge from her nipples.  Her fingernails initially improved, but now she feels they are getting worse.  She has no neurologic complaints.  She denies any recent fevers or illnesses.  She has a good appetite and denies weight loss.  She denies any chest pain or hemoptysis, but admits to persistent and chronic shortness of breath and cough.  She denies any nausea, vomiting, constipation, or diarrhea.  She has no urinary complaints.  Patient offers no further specific complaints today.  REVIEW OF SYSTEMS:   Review of Systems  Constitutional: Positive for malaise/fatigue. Negative for fever and weight loss.  Respiratory: Positive for cough and shortness of breath. Negative for hemoptysis.   Cardiovascular: Negative.  Negative for chest pain and leg swelling.  Gastrointestinal: Negative for abdominal pain, constipation, diarrhea and nausea.  Genitourinary: Negative for dysuria and flank pain.  Musculoskeletal: Positive for back pain and joint pain.  Skin: Negative.  Negative for rash.  Neurological: Negative.  Negative for sensory change, focal weakness, weakness and headaches.  Psychiatric/Behavioral: Negative.  The patient is not nervous/anxious.     As per HPI. Otherwise, a complete review of systems is negative.  PAST MEDICAL HISTORY: Past Medical History:  Diagnosis Date  . Acid  reflux   . Anemia    HAD TO HAVE IRON INFUSIONS BACK IN 2015  . Arthritis    bil hands and back  . Asthma   . Cancer of right lung ([place]) 2015   lung cancer - chemo, Dr Genevive Bi removed RML Lobectomy.   Marland Kitchen Dyspnea    WITH EXERTION  . Hypertension   . Mitral valve prolapse   . Personal history of chemotherapy    F/U Lung Cancer  . Personal history of radiation therapy    Lung cancer  . Primary cancer of right middle lobe of lung ([place]) 03/11/2016    PAST SURGICAL HISTORY: Past Surgical History:  Procedure Laterality Date  . ABDOMINAL HYSTERECTOMY    . BREAST EXCISIONAL BIOPSY Left 1994 (-), 2004 (-)   benign  . COLONOSCOPY  2008    Dr. Donnella Sham  . KNEE SURGERY  2007  . LUNG LOBECTOMY Right    middle lobe  . PORTACATH PLACEMENT Right 02/09/2017   Procedure: INSERTION PORT-A-CATH;  Surgeon: Nestor Lewandowsky, MD;  Location: [facility] ORS;  Service: General;  Laterality: Right;  . SALIVARY GLAND SURGERY Left 1983   with lymph node removal also  . UPPER GI ENDOSCOPY  2008  . VIDEO BRONCHOSCOPY WITH ENDOBRONCHIAL ULTRASOUND Right 01/24/2017   Procedure: VIDEO BRONCHOSCOPY WITH ENDOBRONCHIAL ULTRASOUND;  Surgeon: [name], MD;  Location: [facility] ORS;  Service: Pulmonary;  Laterality: Right;    FAMILY HISTORY: Family History  Problem Relation Age of Onset  . Breast cancer Maternal Aunt   . Breast cancer Paternal Aunt   . Breast cancer Maternal Grandmother   . Breast cancer Maternal Aunt   . Breast cancer Paternal Aunt   .  Healthy Mother   . Healthy Father     ADVANCED DIRECTIVES (Y/N):  N  HEALTH MAINTENANCE: Social History   Tobacco Use  . Smoking status: Never Smoker  . Smokeless tobacco: Never Used  Substance Use Topics  . Alcohol use: Yes    Alcohol/week: 7.0 standard drinks    Types: 7 Glasses of wine per week  . Drug use: No     Colonoscopy:  PAP:  Bone density:  Lipid panel:  Allergies  Allergen Reactions  . Penicillins Swelling and Rash    Has patient had  a PCN reaction causing immediate rash, facial/tongue/throat swelling, SOB or lightheadedness with hypotension: Unknown Has patient had a PCN reaction causing severe rash involving mucus membranes or skin necrosis: Unknown Has patient had a PCN reaction that required hospitalization: No Has patient had a PCN reaction occurring within the last 10 years: No If all of the above answers are "NO", then may proceed with Cephalosporin use.    . Sulfa Antibiotics Nausea And Vomiting    Current Outpatient Medications  Medication Sig Dispense Refill  . albuterol (PROVENTIL HFA;VENTOLIN HFA) 108 (90 Base) MCG/ACT inhaler Inhale 1-2 puffs into the lungs every 6 (six) hours as needed for wheezing or shortness of breath.    . B Complex Vitamins (VITAMIN B COMPLEX PO) Take 1 tablet by mouth daily.     . Biotin w/ Vitamins C & E (HAIR/SKIN/NAILS PO) Take 1 tablet by mouth daily.    . Calcium Carb-Cholecalciferol (CALCIUM 600+D) 600-800 MG-UNIT TABS Take 2 tablets by mouth daily.    . Cholecalciferol (D3 MAXIMUM STRENGTH) 5000 units capsule Take 5,000 Units by mouth daily.    . COLLAGEN PO Take 1,000 mg by mouth daily.    Marland Kitchen doxycycline (VIBRA-TABS) 100 MG tablet Take 1 tablet (100 mg total) by mouth 2 (two) times daily. 10 tablet 0  . doxycycline (VIBRA-TABS) 100 MG tablet Take 1 tablet (100 mg total) by mouth 2 (two) times daily. 28 tablet 0  . fexofenadine (ALLEGRA) 180 MG tablet Take 180 mg by mouth daily as needed for allergies.     . fluocinonide (LIDEX) 0.05 % external solution     . fluticasone (FLONASE) 50 MCG/ACT nasal spray Place 2 sprays into both nostrils daily as needed for allergies or rhinitis.    . Fluticasone-Salmeterol (ADVAIR) 250-50 MCG/DOSE AEPB Inhale 1 puff into the lungs 2 (two) times daily as needed (for respiratory issues.).    Marland Kitchen lansoprazole (PREVACID) 30 MG capsule Take 30 mg by mouth 2 (two) times daily before a meal.     . lidocaine-prilocaine (EMLA) cream Apply 1 application  topically as needed. Apply generously over the Mediport 45 minutes prior to chemotherapy. 30 g 0  . losartan (COZAAR) 25 MG tablet Take 25 mg by mouth daily.     . meloxicam (MOBIC) 7.5 MG tablet Take 1 tablet by mouth 1 day or 1 dose.    . meloxicam (MOBIC) 7.5 MG tablet Take 1 tablet (7.5 mg total) by mouth daily. 30 tablet 0  . metoCLOPramide (REGLAN) 10 MG tablet Take 1 tablet (10 mg total) by mouth every 8 (eight) hours as needed for nausea. 40 tablet 3  . mometasone (ELOCON) 0.1 % lotion     . mupirocin ointment (BACTROBAN) 2 %   0  . ondansetron (ZOFRAN) 8 MG tablet Take 1 tablet (8 mg total) by mouth every 8 (eight) hours as needed for nausea or vomiting (start 3 days; after chemo). 40 tablet  1  . osimertinib mesylate (TAGRISSO) 80 MG tablet Take 1 tablet (80 mg total) by mouth daily. 30 tablet 5  . polyethylene glycol (MIRALAX / GLYCOLAX) packet Take 17 g by mouth daily as needed for mild constipation.    Marland Kitchen PREMARIN 1.25 MG tablet   1  . prochlorperazine (COMPAZINE) 10 MG tablet Take 1 tablet (10 mg total) by mouth every 6 (six) hours as needed for nausea or vomiting. 30 tablet 0  . pseudoephedrine-guaifenesin (MUCINEX D) 60-600 MG 12 hr tablet Take 1 tablet by mouth every 12 (twelve) hours.    . triamcinolone (KENALOG) 0.1 % paste Use as directed 1 application in the mouth or throat 2 (two) times daily. 5 g 12  . Vitamin D, Ergocalciferol, (DRISDOL) 50000 units CAPS capsule Take 50,000 Units by mouth every 7 (seven) days.      No current facility-administered medications for this visit.    Facility-Administered Medications Ordered in Other Visits  Medication Dose Route Frequency Provider Last Rate Last Dose  . heparin lock flush 100 unit/mL  500 Units Intravenous Once Lloyd Huger, MD      . sodium chloride flush (NS) 0.9 % injection 10 mL  10 mL Intravenous PRN Lloyd Huger, MD      . sodium chloride flush (NS) 0.9 % injection 10 mL  10 mL Intravenous PRN Lloyd Huger, MD   10 mL at 01/22/18 1419    OBJECTIVE: Vitals:   02/05/19 1407  BP: 140/86  Pulse: 90  Temp: (!) 97 F (36.1 C)  SpO2: 100%     Body mass index is 21.14 kg/m.    ECOG FS:0 - Asymptomatic  General: Well-developed, well-nourished, no acute distress. Eyes: Pink conjunctiva, anicteric sclera. HEENT: Normocephalic, moist mucous membranes, clear oropharnyx. Lungs: Clear to auscultation bilaterally. Heart: Regular rate and rhythm. No rubs, murmurs, or gallops. Abdomen: Soft, nontender, nondistended. No organomegaly noted, normoactive bowel sounds. Musculoskeletal: No edema, cyanosis, or clubbing. Neuro: Alert, answering all questions appropriately. Cranial nerves grossly intact. Skin: No rashes or petechiae noted. Psych: Normal affect.  LAB RESULTS:  Lab Results  Component Value Date   NA 135 01/28/2019   K 4.1 01/28/2019   CL 103 01/28/2019   CO2 24 01/28/2019   GLUCOSE 97 01/28/2019   BUN 18 01/28/2019   CREATININE 0.79 01/28/2019   CALCIUM 8.6 (L) 01/28/2019   PROT 6.5 01/28/2019   ALBUMIN 3.5 01/28/2019   AST 34 01/28/2019   ALT 29 01/28/2019   ALKPHOS 64 01/28/2019   BILITOT 0.5 01/28/2019   GFRNONAA >60 01/28/2019   GFRAA >60 01/28/2019    Lab Results  Component Value Date   WBC 4.7 01/28/2019   NEUTROABS 3.4 01/28/2019   HGB 10.6 (L) 01/28/2019   HCT 31.7 (L) 01/28/2019   MCV 89.3 01/28/2019   PLT 183 01/28/2019     STUDIES: No results found.  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  She initially received 4 cycles of adjuvant cisplatin and Alimta completing on January 23, 2014.  Patient was on the [facility] protocol for maintenance Tarceva versus placebo, but discontinued treatment in November 2015 because of significant side effects.  Although blinded, presumption was she was receiving Tarceva.  She recently was noted to have a recurrence confirmed by right paratracheal lymph  node biopsy on January 24, 2017.  PET scan on February 03, 2017 did not reveal any distant metastasis.  She underwent  treatment with weekly carboplatinum and Taxol along with daily XRT finishing in October 2018.  She initiated maintenance durvalumab on June 29, 2017.  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  Patient started on Tagrisso in April 2019.   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  PLAN:   1. 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  Patient initiated Tagrisso in April 2019.  PET scan results from April 17, 2018 reviewed independently with no evidence of residual or progressive disease.  Repeat CT scan completed on July 30, 2018 revealed persistent right pleural effusion, but otherwise no evidence of recurrent or progressive disease.  We once again discussed dose reducing her Tagrisso from 80 mg to 40 mg, but patient declined and wishes to continue her current treatment as prescribed.  Patient missed her most recent CT scan, therefore will reschedule in [redacted] weeks along with her port flush.  Return to clinic 1 to 2 days after her imaging to discuss the results.  2.  Diarrhea: Chronic and unchanged.  Continue Lomotil as prescribed. 3.  Cough: Chronic and unchanged, monitor. 4.  Fingernail cracking: Possibly secondary to [place].  Patient was given a prescription for doxycycline for 14 days. 5.  Breast lesion: Patient states that she intends to contact her surgeon for further evaluation. 6.  Back/right hip pain: Will include CT of abdomen pelvis along with a chest CT for further evaluation.  I spent a total of 30 minutes face-to-face with the patient of which greater than 50% of the visit was spent in counseling and coordination of care as detailed above.   Patient expressed understanding and was in agreement with this plan. She also understands that  She can call clinic at any time with any questions, concerns, or complaints.    Lloyd Huger, MD   02/05/2019 4:38 PM

## 2019-02-04 ENCOUNTER — Other Ambulatory Visit: Payer: Self-pay

## 2019-02-04 ENCOUNTER — Telehealth: Payer: Self-pay | Admitting: *Deleted

## 2019-02-04 NOTE — Telephone Encounter (Signed)
T/C made to patient Lauren Page for purpose of reviewing [facility] [phone] patient letter. The letter was read to patient and she was allowed to ask questions. As is turns out, she is currently taking the drug osimertinib (Tagrisso); which was discussed in the letter, and states she has been on it for 15 months now. States she has not taken erlotinib in almost 5 years, but does remember the side effects she experienced while taking it. All immediate questions were answered and patient states she wishes to continue in follow up for the study and not make any changes. Informed patient that she will be given a copy of this letter at her clinic visit tomorrow and that Dr. Grayland Ormond will be available to answer any questions she may have. Also informed that she can call me at any time if she develops questions in the future. [name], BSN, MHA, OCN 02/04/2019 3:51 PM

## 2019-02-05 ENCOUNTER — Encounter: Payer: Self-pay | Admitting: Oncology

## 2019-02-05 ENCOUNTER — Other Ambulatory Visit: Payer: Self-pay

## 2019-02-05 ENCOUNTER — Inpatient Hospital Stay: Payer: Medicare Other | Attending: Oncology | Admitting: Oncology

## 2019-02-05 VITALS — BP 140/86 | HR 90 | Temp 97.0°F | Ht 67.0 in | Wt 135.0 lb

## 2019-02-05 DIAGNOSIS — C3490 Malignant neoplasm of unspecified part of unspecified bronchus or lung: Secondary | ICD-10-CM | POA: Diagnosis not present

## 2019-02-05 DIAGNOSIS — M25551 Pain in right hip: Secondary | ICD-10-CM | POA: Insufficient documentation

## 2019-02-05 DIAGNOSIS — J910 Malignant pleural effusion: Secondary | ICD-10-CM | POA: Insufficient documentation

## 2019-02-05 DIAGNOSIS — R197 Diarrhea, unspecified: Secondary | ICD-10-CM | POA: Insufficient documentation

## 2019-02-05 DIAGNOSIS — C342 Malignant neoplasm of middle lobe, bronchus or lung: Secondary | ICD-10-CM | POA: Insufficient documentation

## 2019-02-05 MED ORDER — DOXYCYCLINE HYCLATE 100 MG PO TABS: 100.0000 mg | ORAL_TABLET | Freq: Two times a day (BID) | ORAL | 0 refills | Status: DC

## 2019-02-05 NOTE — Research (Signed)
Research RN met with patient today prior to her scheduled visit with Dr. Grayland Ormond. RN introduced herself to the patient and explained that she needed to give her a letter from the protocol study she had been a part of. The patient states she was aware of the letter and is currently receiving the new medication that is mentioned. The patient verified that the letter was reviewed with her in it's entirety via the telephone with [name], RN, MSN, OCN from the research department here at [facility] yesterday August 3,2020 to her reported satisfaction. The patient denies any questions and states she will read the letter again later. Research RN thanked the patient for her time and encouraged the patient to call if she has any questions or concerns in the future regarding her participation in clinical trials.

## 2019-02-05 NOTE — Progress Notes (Signed)
Patient stated that she had been doing well. 

## 2019-02-14 ENCOUNTER — Ambulatory Visit: Payer: Medicare Other | Admitting: General Surgery

## 2019-02-14 ENCOUNTER — Ambulatory Visit: Payer: Self-pay | Admitting: General Surgery

## 2019-02-14 NOTE — H&P (View-Only) (Signed)
HISTORY OF PRESENT ILLNESS:    Lauren Page is a 68 y.o.female patient who comes for evaluation of left nipple discharge.  Patient with left nipple discharge seen for more than 6 months ago.  Patient reports that the nipple discharge has been constant.  She was evaluated in May for this matter.  She had a mammogram that was negative.  She also had ultrasound that was negative for any suspicious lesion.  Due to the persistent bloody nipple discharge MRI of the breast was done.  The MRI showed suspicious lesion that were biopsied showing fibrotic changes.  This was discordant with the images.  I personally evaluated the mammogram, ultrasound and MRI.  Patient developed hematoma after the MRI guided biopsy.  Hematoma was significant and she was recommended to wait to have a surgery done.  She was previously evaluated by Dr. Bary Castilla.  She comes today for discussion of surgical management.  Patient denies any breast pain.  Patient denies any skin changes.  Patient reports that the area of the bruise has resolved.      PAST MEDICAL HISTORY:      Past Medical History:  Diagnosis Date  . Abnormal echocardiogram 04/05/1990   minimal mitral valve prolapse with minimal mitral regurge, normal LV function.  . Allergic rhinitis   . COPD with asthma , unspecified (CMS-HCC)   . Encounter for blood transfusion    Remote history of blood transfusion relating to miscarriage without known complications  . History of colonic polyps   . Hoarseness    recurrent (followed by Dr. Nadeen Landau)  . Masses of both breasts    (followed by Dr. Jamal Collin)  . Reflux esophagitis         PAST SURGICAL HISTORY:   Past Surgical History:  Procedure Laterality Date  . COLONOSCOPY  09/07/2006   tubular adenoma  . COLONOSCOPY  05/22/2012   adenoma, repeat 05/22/2017  . EGD  10/03/2006   normal  . HYSTERECTOMY    . S/P breast biopsies, multiple     Dr. Jamal Collin  . S/P hemorrhoidectomy    . S/P  salivary gland removal  1984         MEDICATIONS:  EncounterMedications        Outpatient Encounter Medications as of 02/14/2019  Medication Sig Dispense Refill  . ADVAIR DISKUS 250-50 mcg/dose diskus inhaler INHALE ONE PUFF INTO THE LUNGS 2 TIMES DAILY. 60 each 11  . COLLAGEN MISC Take 1 tablet by mouth once daily    . cyclobenzaprine (FLEXERIL) 5 MG tablet Take 5 mg by mouth 3 (three) times daily as needed for Muscle spasms    . doxycycline (VIBRA-TABS) 100 MG tablet Take 100 mg by mouth 2 (two) times daily Take 1 tablet (100 mg total) by mouth 2 (two) times daily.    Marland Kitchen estrogens, conjugated, (PREMARIN) 1.25 MG tablet Take 1 tablet (1.25 mg total) by mouth once daily 90 tablet 1  . fluticasone (FLONASE) 50 mcg/actuation nasal spray USE 2 SPRAYS IN EACH NOSTRIL AT BEDTIME 48 g 3  . lansoprazole (PREVACID) 30 MG DR capsule Take 1 capsule (30 mg total) by mouth 2 (two) times daily 180 capsule 3  . lidocaine-prilocaine (EMLA) cream Apply 1 Application topically once as needed Apply 45-60 minutes prior to port access    . losartan (COZAAR) 25 MG tablet TAKE 1 TABLET BY MOUTH ONCE DAILY AT BEDTIME 90 tablet 1  . meloxicam (MOBIC) 7.5 MG tablet Take 1 tablet (7.5 mg total) by mouth once  daily 30 tablet 1  . osimertinib (TAGRISSO) 80 mg tablet Take 80 mg by mouth once daily    . polyethylene glycol (MIRALAX) packet Take 17 g by mouth once daily as needed for Constipation    . PROAIR HFA 90 mcg/actuation inhaler INHALE 2 PUFFS AS DIRECTED AS NEEDED 25.5 g 0  . prochlorperazine (COMPAZINE) 10 MG tablet Take 10 mg by mouth every 6 (six) hours as needed for Nausea & Vomiting    . pseudoephedrine-guaifenesin (MUCINEX D) 60-600 mg XR tablet Take 1 tablet by mouth every 12 (twelve) hours as needed for Cough or Congestion    . sodium chloride (OCEAN) 0.65 % nasal spray Place 2 sprays into both nostrils 3 (three) times daily 30 mL 5  . vitamin B complex TbER Take 1 tablet by mouth once  daily.    Marland Kitchen VITAMIN D2 1,250 mcg (50,000 unit) capsule TAKE 1 CAPSULE BY MOUTH ONCE A WEEK 4 capsule 11   No facility-administered encounter medications on file as of 02/14/2019.        ALLERGIES:   Penicillin v potassium and Sulfa (sulfonamide antibiotics)   SOCIAL HISTORY:  Social History          Socioeconomic History  . Marital status: Married    Spouse name: Not on file  . Number of children: Not on file  . Years of education: Not on file  . Highest education level: Not on file  Occupational History  . Not on file  Social Needs  . Financial resource strain: Not on file  . Food insecurity:    Worry: Not on file    Inability: Not on file  . Transportation needs:    Medical: Not on file    Non-medical: Not on file  Tobacco Use  . Smoking status: Never Smoker  . Smokeless tobacco: Never Used  Substance and Sexual Activity  . Alcohol use: Not Currently    Alcohol/week: 0.0 standard drinks    Comment: 5-6 glasses of wine per week  . Drug use: No  . Sexual activity: Not on file  Other Topics Concern  . Not on file  Social History Narrative  . Not on file      FAMILY HISTORY:       Family History  Problem Relation Age of Onset  . Asthma Mother   . Colon cancer Father   . Diabetes type II Father   . Myocardial Infarction (Heart attack) Father   . High blood pressure (Hypertension) Father   . Colon cancer Maternal Grandmother      GENERAL REVIEW OF SYSTEMS:   General ROS: negative for - chills, fatigue, fever, weight gain or weight loss Allergy and Immunology ROS: negative for - hives  Hematological and Lymphatic ROS: negative for - bleeding problems or bruising, negative for palpable nodes Endocrine ROS: negative for - heat or cold intolerance, hair changes Respiratory ROS: negative for - cough, shortness of breath or wheezing Cardiovascular ROS: no chest pain or palpitations GI ROS: negative for nausea, vomiting, abdominal  pain, diarrhea, constipation Musculoskeletal ROS: negative for - joint swelling or muscle pain Neurological ROS: negative for - confusion, syncope Dermatological ROS: negative for pruritus and rash  PHYSICAL EXAM:     Vitals:   02/14/19 1507  BP: (!) 141/91  Pulse: 88  .  Ht:157.5 cm (5\' 2" ) Wt:60.8 kg (134 lb) FTD:DUKG surface area is 1.63 meters squared. Body mass index is 24.51 kg/m.Marland Kitchen   GENERAL: Alert, active, oriented x3  HEENT:  Pupils equal reactive to light. Extraocular movements are intact. Sclera clear. Palpebral conjunctiva normal red color.Pharynx clear.  NECK: Supple with no palpable mass and no adenopathy.  LUNGS: Sound clear with no rales rhonchi or wheezes.  HEART: Regular rhythm S1 and S2 without murmur.  BREAST: Left breast skin normal, no palpable mass.  There was some yellowish fluid discharge through the left nipple in the lower outer quadrant. No bleeding. No axillary adenopathy. The right breast showed no skin changes, no palpable mass, no nipple discharge or retraction and no axillary adenopathy.   ABDOMEN: Soft and depressible, nontender with no palpable mass, no hepatomegaly. Wounds dry and clean.  EXTREMITIES: Well-developed well-nourished symmetrical with no dependent edema.  NEUROLOGICAL: Awake alert oriented, facial expression symmetrical, moving all extremities.      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 This is discordant with images.  - Patient oriented about the recommendation of excisional biopsy  Patient was oriented again about the pathology results. Surgical alternatives were discussed with patient including excisional biopsy. Surgical technique and post operative care was discussed with patient. Risk of surgery was discussed with patient including but  not limited to: wound infection, seroma, hematoma, brachial plexopathy, mondor's disease (thrombosis of small veins of breast), chronic wound pain, breast lymphedema, altered sensation to the nipple and cosmesis among others.            PLAN:  1. Left breast needle guided excisional biopsy (19125) 2. CBC, Renal function panel done on 12/13/2018 3. Avoid taking aspirin 5 days before surgery  4. Contact us if has any question or concern.   Patient verbalized understanding, all questions were answered, and were agreeable with the plan outlined above.   Herbert Pun, MD  Electronically signed by Herbert Pun, MD

## 2019-02-14 NOTE — H&P (Signed)
HISTORY OF PRESENT ILLNESS:    Lauren Page is a 68 y.o.female patient who comes for evaluation of left nipple discharge.  Patient with left nipple discharge seen for more than 6 months ago.  Patient reports that the nipple discharge has been constant.  She was evaluated in May for this matter.  She had a mammogram that was negative.  She also had ultrasound that was negative for any suspicious lesion.  Due to the persistent bloody nipple discharge MRI of the breast was done.  The MRI showed suspicious lesion that were biopsied showing fibrotic changes.  This was discordant with the images.  I personally evaluated the mammogram, ultrasound and MRI.  Patient developed hematoma after the MRI guided biopsy.  Hematoma was significant and she was recommended to wait to have a surgery done.  She was previously evaluated by Dr. Bary Castilla.  She comes today for discussion of surgical management.  Patient denies any breast pain.  Patient denies any skin changes.  Patient reports that the area of the bruise has resolved.      PAST MEDICAL HISTORY:      Past Medical History:  Diagnosis Date  . Abnormal echocardiogram 04/05/1990   minimal mitral valve prolapse with minimal mitral regurge, normal LV function.  . Allergic rhinitis   . COPD with asthma , unspecified (CMS-HCC)   . Encounter for blood transfusion    Remote history of blood transfusion relating to miscarriage without known complications  . History of colonic polyps   . Hoarseness    recurrent (followed by Dr. Nadeen Landau)  . Masses of both breasts    (followed by Dr. Jamal Collin)  . Reflux esophagitis         PAST SURGICAL HISTORY:   Past Surgical History:  Procedure Laterality Date  . COLONOSCOPY  09/07/2006   tubular adenoma  . COLONOSCOPY  05/22/2012   adenoma, repeat 05/22/2017  . EGD  10/03/2006   normal  . HYSTERECTOMY    . S/P breast biopsies, multiple     Dr. Jamal Collin  . S/P hemorrhoidectomy    . S/P  salivary gland removal  1984         MEDICATIONS:  EncounterMedications        Outpatient Encounter Medications as of 02/14/2019  Medication Sig Dispense Refill  . ADVAIR DISKUS 250-50 mcg/dose diskus inhaler INHALE ONE PUFF INTO THE LUNGS 2 TIMES DAILY. 60 each 11  . COLLAGEN MISC Take 1 tablet by mouth once daily    . cyclobenzaprine (FLEXERIL) 5 MG tablet Take 5 mg by mouth 3 (three) times daily as needed for Muscle spasms    . doxycycline (VIBRA-TABS) 100 MG tablet Take 100 mg by mouth 2 (two) times daily Take 1 tablet (100 mg total) by mouth 2 (two) times daily.    Marland Kitchen estrogens, conjugated, (PREMARIN) 1.25 MG tablet Take 1 tablet (1.25 mg total) by mouth once daily 90 tablet 1  . fluticasone (FLONASE) 50 mcg/actuation nasal spray USE 2 SPRAYS IN EACH NOSTRIL AT BEDTIME 48 g 3  . lansoprazole (PREVACID) 30 MG DR capsule Take 1 capsule (30 mg total) by mouth 2 (two) times daily 180 capsule 3  . lidocaine-prilocaine (EMLA) cream Apply 1 Application topically once as needed Apply 45-60 minutes prior to port access    . losartan (COZAAR) 25 MG tablet TAKE 1 TABLET BY MOUTH ONCE DAILY AT BEDTIME 90 tablet 1  . meloxicam (MOBIC) 7.5 MG tablet Take 1 tablet (7.5 mg total) by mouth once  daily 30 tablet 1  . osimertinib (TAGRISSO) 80 mg tablet Take 80 mg by mouth once daily    . polyethylene glycol (MIRALAX) packet Take 17 g by mouth once daily as needed for Constipation    . PROAIR HFA 90 mcg/actuation inhaler INHALE 2 PUFFS AS DIRECTED AS NEEDED 25.5 g 0  . prochlorperazine (COMPAZINE) 10 MG tablet Take 10 mg by mouth every 6 (six) hours as needed for Nausea & Vomiting    . pseudoephedrine-guaifenesin (MUCINEX D) 60-600 mg XR tablet Take 1 tablet by mouth every 12 (twelve) hours as needed for Cough or Congestion    . sodium chloride (OCEAN) 0.65 % nasal spray Place 2 sprays into both nostrils 3 (three) times daily 30 mL 5  . vitamin B complex TbER Take 1 tablet by mouth once  daily.    Marland Kitchen VITAMIN D2 1,250 mcg (50,000 unit) capsule TAKE 1 CAPSULE BY MOUTH ONCE A WEEK 4 capsule 11   No facility-administered encounter medications on file as of 02/14/2019.        ALLERGIES:   Penicillin v potassium and Sulfa (sulfonamide antibiotics)   SOCIAL HISTORY:  Social History          Socioeconomic History  . Marital status: Married    Spouse name: Not on file  . Number of children: Not on file  . Years of education: Not on file  . Highest education level: Not on file  Occupational History  . Not on file  Social Needs  . Financial resource strain: Not on file  . Food insecurity:    Worry: Not on file    Inability: Not on file  . Transportation needs:    Medical: Not on file    Non-medical: Not on file  Tobacco Use  . Smoking status: Never Smoker  . Smokeless tobacco: Never Used  Substance and Sexual Activity  . Alcohol use: Not Currently    Alcohol/week: 0.0 standard drinks    Comment: 5-6 glasses of wine per week  . Drug use: No  . Sexual activity: Not on file  Other Topics Concern  . Not on file  Social History Narrative  . Not on file      FAMILY HISTORY:       Family History  Problem Relation Age of Onset  . Asthma Mother   . Colon cancer Father   . Diabetes type II Father   . Myocardial Infarction (Heart attack) Father   . High blood pressure (Hypertension) Father   . Colon cancer Maternal Grandmother      GENERAL REVIEW OF SYSTEMS:   General ROS: negative for - chills, fatigue, fever, weight gain or weight loss Allergy and Immunology ROS: negative for - hives  Hematological and Lymphatic ROS: negative for - bleeding problems or bruising, negative for palpable nodes Endocrine ROS: negative for - heat or cold intolerance, hair changes Respiratory ROS: negative for - cough, shortness of breath or wheezing Cardiovascular ROS: no chest pain or palpitations GI ROS: negative for nausea, vomiting, abdominal  pain, diarrhea, constipation Musculoskeletal ROS: negative for - joint swelling or muscle pain Neurological ROS: negative for - confusion, syncope Dermatological ROS: negative for pruritus and rash  PHYSICAL EXAM:     Vitals:   02/14/19 1507  BP: (!) 141/91  Pulse: 88  .  Ht:157.5 cm (5\' 2" ) Wt:60.8 kg (134 lb) LOV:FIEP surface area is 1.63 meters squared. Body mass index is 24.51 kg/m.Marland Kitchen   GENERAL: Alert, active, oriented x3  HEENT:  Pupils equal reactive to light. Extraocular movements are intact. Sclera clear. Palpebral conjunctiva normal red color.Pharynx clear.  NECK: Supple with no palpable mass and no adenopathy.  LUNGS: Sound clear with no rales rhonchi or wheezes.  HEART: Regular rhythm S1 and S2 without murmur.  BREAST: Left breast skin normal, no palpable mass.  There was some yellowish fluid discharge through the left nipple in the lower outer quadrant. No bleeding. No axillary adenopathy. The right breast showed no skin changes, no palpable mass, no nipple discharge or retraction and no axillary adenopathy.   ABDOMEN: Soft and depressible, nontender with no palpable mass, no hepatomegaly. Wounds dry and clean.  EXTREMITIES: Well-developed well-nourished symmetrical with no dependent edema.  NEUROLOGICAL: Awake alert oriented, facial expression symmetrical, moving all extremities.      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 This is discordant with images.  - Patient oriented about the recommendation of excisional biopsy  Patient was oriented again about the pathology results. Surgical alternatives were discussed with patient including excisional biopsy. Surgical technique and post operative care was discussed with patient. Risk of surgery was discussed with patient including but  not limited to: wound infection, seroma, hematoma, brachial plexopathy, mondor's disease (thrombosis of small veins of breast), chronic wound pain, breast lymphedema, altered sensation to the nipple and cosmesis among others.            PLAN:  1. Left breast needle guided excisional biopsy (19125) 2. CBC, Renal function panel done on 12/13/2018 3. Avoid taking aspirin 5 days before surgery  4. Contact us if has any question or concern.   Patient verbalized understanding, all questions were answered, and were agreeable with the plan outlined above.   Herbert Pun, MD  Electronically signed by Herbert Pun, MD

## 2019-02-19 ENCOUNTER — Other Ambulatory Visit: Payer: Self-pay | Admitting: General Surgery

## 2019-02-19 DIAGNOSIS — N6452 Nipple discharge: Secondary | ICD-10-CM

## 2019-02-22 ENCOUNTER — Other Ambulatory Visit: Payer: Self-pay

## 2019-02-22 ENCOUNTER — Encounter
Admission: RE | Admit: 2019-02-22 | Discharge: 2019-02-22 | Disposition: A | Discharge status: Home / Self Care | Payer: Medicare Other | Source: Ambulatory Visit | Attending: General Surgery | Admitting: General Surgery

## 2019-02-22 DIAGNOSIS — Z01810 Encounter for preprocedural cardiovascular examination: Secondary | ICD-10-CM | POA: Insufficient documentation

## 2019-02-22 NOTE — Patient Instructions (Signed)
Your procedure is scheduled on: Friday 03/01/19 Report to [facility] at previously scheduled time (7:45) .  Remember: Instructions that are not followed completely may result in serious medical risk, up to and including death, or upon the discretion of your surgeon and anesthesiologist your surgery may need to be rescheduled.     _X__ 1. Do not eat food after midnight the night before your procedure.                 No gum chewing or hard candies. You may drink clear liquids up to 2 hours                 before you are scheduled to arrive for your surgery- DO not drink clear                 liquids within 2 hours of the start of your surgery.                 Clear Liquids include:  water, apple juice without pulp, clear carbohydrate                 drink such as Clearfast or Gatorade, Black Coffee or Tea (Do not add                 anything to coffee or tea). Diabetics water only  __X__2.  On the morning of surgery brush your teeth with toothpaste and water, you                 may rinse your mouth with mouthwash if you wish.  Do not swallow any              toothpaste of mouthwash.     _X__ 3.  No Alcohol for 24 hours before or after surgery.   _X__ 4.  Do Not Smoke or use e-cigarettes For 24 Hours Prior to Your Surgery.                 Do not use any chewable tobacco products for at least 6 hours prior to                 surgery.  ____  5.  Bring all medications with you on the day of surgery if instructed.   __X__  6.  Notify your doctor if there is any change in your medical condition      (cold, fever, infections).     Do not wear jewelry, make-up, hairpins, clips or nail polish. Do not wear lotions, powders, or perfumes.  Do not shave 48 hours prior to surgery. Men may shave face and neck. Do not bring valuables to the hospital.    [facility] is not responsible for any belongings or valuables.  Contacts, dentures/partials or body piercings may not be worn into surgery.  Bring a case for your contacts, glasses or hearing aids, a denture cup will be supplied. Leave your suitcase in the car. After surgery it may be brought to your room. For patients admitted to the hospital, discharge time is determined by your treatment team.   Patients discharged the day of surgery will not be allowed to drive home.   Please read over the following fact sheets that you were given:   MRSA Information  __X__ Take these medicines the morning of surgery with A SIP OF WATER:    1. lansoprazole (PREVACID  2.   3.   4.  5.  6.  ____ Fleet Enema (as directed)   __X__ Use CHG Soap/SAGE wipes as directed  __X__ Use inhalers on the day of surgery  ____ Stop metformin/Janumet/Farxiga 2 days prior to surgery    ____ Take 1/2 of usual insulin dose the night before surgery. No insulin the morning          of surgery.   ____ Stop Blood Thinners Coumadin/Plavix/Xarelto/Pleta/Pradaxa/Eliquis/Effient/Aspirin  on   Or contact your Surgeon, Cardiologist or Medical Doctor regarding  ability to stop your blood thinners  __X__ Stop Anti-inflammatories 7 days before surgery such as Advil, Ibuprofen, Motrin,  BC or Goodies Powder, Naprosyn, Naproxen, Aleve, Aspirin    __X__ Stop all herbal supplements, fish oil or vitamin E until after surgery.    ____ Bring C-Pap to the hospital.

## 2019-02-26 ENCOUNTER — Other Ambulatory Visit: Payer: Self-pay

## 2019-02-26 ENCOUNTER — Other Ambulatory Visit
Admission: RE | Admit: 2019-02-26 | Discharge: 2019-02-26 | Disposition: A | Discharge status: Home / Self Care | Payer: Medicare Other | Source: Ambulatory Visit | Attending: General Surgery | Admitting: General Surgery

## 2019-02-26 DIAGNOSIS — Z20828 Contact with and (suspected) exposure to other viral communicable diseases: Secondary | ICD-10-CM | POA: Insufficient documentation

## 2019-02-26 DIAGNOSIS — Z01812 Encounter for preprocedural laboratory examination: Secondary | ICD-10-CM | POA: Insufficient documentation

## 2019-02-26 LAB — SARS CORONAVIRUS 2 (TAT 6-24 HRS): SARS Coronavirus 2: NEGATIVE

## 2019-03-01 ENCOUNTER — Ambulatory Visit
Admission: RE | Admit: 2019-03-01 | Discharge: 2019-03-01 | Disposition: A | Discharge status: Home / Self Care | Payer: Medicare Other | Source: Ambulatory Visit | Attending: General Surgery | Admitting: General Surgery

## 2019-03-01 ENCOUNTER — Other Ambulatory Visit: Payer: Self-pay

## 2019-03-01 ENCOUNTER — Encounter
Admission: RE | Disposition: A | Discharge status: Home / Self Care | Payer: Self-pay | Source: Home / Self Care | Attending: General Surgery

## 2019-03-01 ENCOUNTER — Ambulatory Visit: Payer: Medicare Other | Admitting: Anesthesiology

## 2019-03-01 ENCOUNTER — Encounter: Payer: Self-pay | Admitting: Emergency Medicine

## 2019-03-01 ENCOUNTER — Ambulatory Visit
Admission: RE | Admit: 2019-03-01 | Discharge: 2019-03-01 | Disposition: A | Discharge status: Home / Self Care | Payer: Medicare Other | Attending: General Surgery | Admitting: General Surgery

## 2019-03-01 DIAGNOSIS — N6452 Nipple discharge: Secondary | ICD-10-CM

## 2019-03-01 DIAGNOSIS — Z923 Personal history of irradiation: Secondary | ICD-10-CM | POA: Diagnosis not present

## 2019-03-01 DIAGNOSIS — K210 Gastro-esophageal reflux disease with esophagitis: Secondary | ICD-10-CM | POA: Diagnosis not present

## 2019-03-01 DIAGNOSIS — I10 Essential (primary) hypertension: Secondary | ICD-10-CM | POA: Insufficient documentation

## 2019-03-01 DIAGNOSIS — D242 Benign neoplasm of left breast: Secondary | ICD-10-CM | POA: Insufficient documentation

## 2019-03-01 DIAGNOSIS — Z7951 Long term (current) use of inhaled steroids: Secondary | ICD-10-CM | POA: Diagnosis not present

## 2019-03-01 DIAGNOSIS — J449 Chronic obstructive pulmonary disease, unspecified: Secondary | ICD-10-CM | POA: Insufficient documentation

## 2019-03-01 DIAGNOSIS — Z79899 Other long term (current) drug therapy: Secondary | ICD-10-CM | POA: Insufficient documentation

## 2019-03-01 DIAGNOSIS — Z85118 Personal history of other malignant neoplasm of bronchus and lung: Secondary | ICD-10-CM | POA: Insufficient documentation

## 2019-03-01 DIAGNOSIS — Z791 Long term (current) use of non-steroidal anti-inflammatories (NSAID): Secondary | ICD-10-CM | POA: Insufficient documentation

## 2019-03-01 DIAGNOSIS — Z79890 Hormone replacement therapy (postmenopausal): Secondary | ICD-10-CM | POA: Insufficient documentation

## 2019-03-01 DIAGNOSIS — Z9221 Personal history of antineoplastic chemotherapy: Secondary | ICD-10-CM | POA: Insufficient documentation

## 2019-03-01 DIAGNOSIS — Z902 Acquired absence of lung [part of]: Secondary | ICD-10-CM | POA: Diagnosis not present

## 2019-03-01 HISTORY — PX: BREAST EXCISIONAL BIOPSY: SUR124

## 2019-03-01 HISTORY — PX: BREAST BIOPSY: SHX20

## 2019-03-01 SURGERY — BREAST BIOPSY WITH NEEDLE LOCALIZATION
Anesthesia: General | Site: Breast | Laterality: Left

## 2019-03-01 MED ORDER — CLINDAMYCIN PHOSPHATE 900 MG/50ML IV SOLN
900.0000 mg | INTRAVENOUS | Status: AC
  Administered 2019-03-01: 900 mg via INTRAVENOUS

## 2019-03-01 MED ORDER — LACTATED RINGERS IV SOLN
INTRAVENOUS | Status: DC
  Administered 2019-03-01 (×2): via INTRAVENOUS

## 2019-03-01 MED ORDER — ACETAMINOPHEN 10 MG/ML IV SOLN
INTRAVENOUS | Status: AC
  Filled 2019-03-01: qty 100

## 2019-03-01 MED ORDER — DEXMEDETOMIDINE HCL IN NACL 200 MCG/50ML IV SOLN
INTRAVENOUS | Status: DC | PRN
  Administered 2019-03-01: 4 ug via INTRAVENOUS

## 2019-03-01 MED ORDER — MIDAZOLAM HCL 2 MG/2ML IJ SOLN
INTRAMUSCULAR | Status: DC | PRN
  Administered 2019-03-01 (×2): 1 mg via INTRAVENOUS

## 2019-03-01 MED ORDER — HYDROCODONE-ACETAMINOPHEN 5-325 MG PO TABS: 1.0000 | ORAL_TABLET | ORAL | 0 refills | Status: AC | PRN

## 2019-03-01 MED ORDER — HEPARIN SOD (PORK) LOCK FLUSH 100 UNIT/ML IV SOLN
INTRAVENOUS | Status: AC
  Filled 2019-03-01: qty 5

## 2019-03-01 MED ORDER — EPHEDRINE SULFATE 50 MG/ML IJ SOLN
INTRAMUSCULAR | Status: AC
  Filled 2019-03-01: qty 1

## 2019-03-01 MED ORDER — SODIUM CHLORIDE 0.9 % IV SOLN
INTRAVENOUS | Status: DC | PRN
  Administered 2019-03-01: 15 ug/min via INTRAVENOUS

## 2019-03-01 MED ORDER — PHENYLEPHRINE HCL (PRESSORS) 10 MG/ML IV SOLN
INTRAVENOUS | Status: DC | PRN
  Administered 2019-03-01 (×4): 100 ug via INTRAVENOUS

## 2019-03-01 MED ORDER — EPHEDRINE SULFATE 50 MG/ML IJ SOLN
INTRAMUSCULAR | Status: DC | PRN
  Administered 2019-03-01 (×2): 5 mg via INTRAVENOUS

## 2019-03-01 MED ORDER — LIDOCAINE HCL (CARDIAC) PF 100 MG/5ML IV SOSY
PREFILLED_SYRINGE | INTRAVENOUS | Status: DC | PRN
  Administered 2019-03-01: 80 mg via INTRAVENOUS

## 2019-03-01 MED ORDER — BUPIVACAINE-EPINEPHRINE (PF) 0.5% -1:200000 IJ SOLN
INTRAMUSCULAR | Status: DC | PRN
  Administered 2019-03-01: 30 mL

## 2019-03-01 MED ORDER — FENTANYL CITRATE (PF) 100 MCG/2ML IJ SOLN
INTRAMUSCULAR | Status: AC
  Administered 2019-03-01: 15:00:00 25 ug via INTRAVENOUS
  Filled 2019-03-01: qty 2

## 2019-03-01 MED ORDER — DEXAMETHASONE SODIUM PHOSPHATE 10 MG/ML IJ SOLN
INTRAMUSCULAR | Status: DC | PRN
  Administered 2019-03-01: 10 mg via INTRAVENOUS

## 2019-03-01 MED ORDER — DEXAMETHASONE SODIUM PHOSPHATE 10 MG/ML IJ SOLN
INTRAMUSCULAR | Status: AC
  Filled 2019-03-01: qty 2

## 2019-03-01 MED ORDER — OXYCODONE HCL 5 MG PO TABS: 5.0000 mg | ORAL_TABLET | Freq: Once | ORAL | Status: DC | PRN

## 2019-03-01 MED ORDER — SODIUM CHLORIDE FLUSH 0.9 % IV SOLN
INTRAVENOUS | Status: AC
  Filled 2019-03-01: qty 10

## 2019-03-01 MED ORDER — LIDOCAINE HCL URETHRAL/MUCOSAL 2 % EX GEL
CUTANEOUS | Status: AC
  Filled 2019-03-01: qty 5

## 2019-03-01 MED ORDER — ONDANSETRON HCL 4 MG/2ML IJ SOLN
INTRAMUSCULAR | Status: AC
  Filled 2019-03-01: qty 8

## 2019-03-01 MED ORDER — FENTANYL CITRATE (PF) 100 MCG/2ML IJ SOLN
25.0000 ug | INTRAMUSCULAR | Status: DC | PRN
  Administered 2019-03-01 (×2): 25 ug via INTRAVENOUS

## 2019-03-01 MED ORDER — FENTANYL CITRATE (PF) 100 MCG/2ML IJ SOLN
INTRAMUSCULAR | Status: DC | PRN
  Administered 2019-03-01: 25 ug via INTRAVENOUS
  Administered 2019-03-01: 50 ug via INTRAVENOUS
  Administered 2019-03-01: 25 ug via INTRAVENOUS

## 2019-03-01 MED ORDER — PHENYLEPHRINE HCL (PRESSORS) 10 MG/ML IV SOLN
INTRAVENOUS | Status: AC
  Filled 2019-03-01: qty 1

## 2019-03-01 MED ORDER — ACETAMINOPHEN 10 MG/ML IV SOLN
INTRAVENOUS | Status: DC | PRN
  Administered 2019-03-01: 1000 mg via INTRAVENOUS

## 2019-03-01 MED ORDER — LIDOCAINE HCL (PF) 2 % IJ SOLN
INTRAMUSCULAR | Status: AC
  Filled 2019-03-01: qty 20

## 2019-03-01 MED ORDER — MIDAZOLAM HCL 2 MG/2ML IJ SOLN
INTRAMUSCULAR | Status: AC
  Filled 2019-03-01: qty 2

## 2019-03-01 MED ORDER — CLINDAMYCIN PHOSPHATE 900 MG/50ML IV SOLN
INTRAVENOUS | Status: AC
  Filled 2019-03-01: qty 50

## 2019-03-01 MED ORDER — ROCURONIUM BROMIDE 50 MG/5ML IV SOLN
INTRAVENOUS | Status: AC
  Filled 2019-03-01: qty 2

## 2019-03-01 MED ORDER — PROPOFOL 10 MG/ML IV BOLUS
INTRAVENOUS | Status: DC | PRN
  Administered 2019-03-01: 130 mg via INTRAVENOUS

## 2019-03-01 MED ORDER — ONDANSETRON HCL 4 MG/2ML IJ SOLN
INTRAMUSCULAR | Status: DC | PRN
  Administered 2019-03-01 (×2): 4 mg via INTRAVENOUS

## 2019-03-01 MED ORDER — FENTANYL CITRATE (PF) 100 MCG/2ML IJ SOLN
INTRAMUSCULAR | Status: AC
  Filled 2019-03-01: qty 2

## 2019-03-01 MED ORDER — OXYCODONE HCL 5 MG/5ML PO SOLN: 5.0000 mg | Freq: Once | ORAL | Status: DC | PRN

## 2019-03-01 MED ORDER — GLYCOPYRROLATE 0.2 MG/ML IJ SOLN
INTRAMUSCULAR | Status: DC | PRN
  Administered 2019-03-01: 0.2 mg via INTRAVENOUS

## 2019-03-01 SURGICAL SUPPLY — 35 items
CANISTER SUCT 1200ML W/VALVE (MISCELLANEOUS) ×2 IMPLANT
CHLORAPREP W/TINT 26 (MISCELLANEOUS) ×2 IMPLANT
CNTNR SPEC 2.5X3XGRAD LEK (MISCELLANEOUS) ×1
CONT SPEC 4OZ STER OR WHT (MISCELLANEOUS) ×1
CONTAINER SPEC 2.5X3XGRAD LEK (MISCELLANEOUS) ×1 IMPLANT
COVER WAND RF STERILE (DRAPES) ×2 IMPLANT
DERMABOND ADVANCED (GAUZE/BANDAGES/DRESSINGS) ×1
DERMABOND ADVANCED .7 DNX12 (GAUZE/BANDAGES/DRESSINGS) ×1 IMPLANT
DEVICE DUBIN SPECIMEN MAMMOGRA (MISCELLANEOUS) ×2 IMPLANT
DRAPE LAPAROTOMY 77X122 PED (DRAPES) ×2 IMPLANT
ELECT CAUTERY BLADE TIP 2.5 (TIP) ×2
ELECT REM PT RETURN 9FT ADLT (ELECTROSURGICAL) ×2
ELECTRODE CAUTERY BLDE TIP 2.5 (TIP) ×1 IMPLANT
ELECTRODE REM PT RTRN 9FT ADLT (ELECTROSURGICAL) ×1 IMPLANT
GLOVE BIO SURGEON STRL SZ 6.5 (GLOVE) ×2 IMPLANT
GLOVE BIOGEL PI IND STRL 6.5 (GLOVE) ×1 IMPLANT
GLOVE BIOGEL PI INDICATOR 6.5 (GLOVE) ×1
GOWN STRL REUS W/ TWL LRG LVL3 (GOWN DISPOSABLE) ×3 IMPLANT
GOWN STRL REUS W/TWL LRG LVL3 (GOWN DISPOSABLE) ×3
KIT TURNOVER KIT A (KITS) ×2 IMPLANT
LABEL OR SOLS (LABEL) ×2 IMPLANT
MARGIN MAP 10MM (MISCELLANEOUS) ×2 IMPLANT
NDL HYPO 25X1 1.5 SAFETY (NEEDLE) ×2 IMPLANT
NEEDLE HYPO 25X1 1.5 SAFETY (NEEDLE) ×4 IMPLANT
PACK BASIN MINOR ARMC (MISCELLANEOUS) ×2 IMPLANT
SUT ETHILON 3-0 FS-10 30 BLK (SUTURE) ×2
SUT MNCRL 4-0 (SUTURE) ×1
SUT MNCRL 4-0 27XMFL (SUTURE) ×1
SUT SILK 0 SH 30 (SUTURE) ×1 IMPLANT
SUT VIC AB 3-0 SH 27 (SUTURE) ×1
SUT VIC AB 3-0 SH 27X BRD (SUTURE) ×1 IMPLANT
SUTURE EHLN 3-0 FS-10 30 BLK (SUTURE) ×1 IMPLANT
SUTURE MNCRL 4-0 27XMF (SUTURE) ×1 IMPLANT
SYR 10ML LL (SYRINGE) ×2 IMPLANT
WATER STERILE IRR 1000ML POUR (IV SOLUTION) ×2 IMPLANT

## 2019-03-01 NOTE — Discharge Instructions (Addendum)
°  Diet: Resume home heart healthy regular diet.  ° °Activity: Increase activity as tolerated. Light activity and walking are encouraged. Do not drive or drink alcohol if taking narcotic pain medications. ° °Wound care: May shower with soapy water and pat dry (do not rub incisions), but no baths or submerging incision underwater until follow-up. (no swimming)  ° °Medications: Resume all home medications. For mild to moderate pain: acetaminophen (Tylenol) or ibuprofen (if no kidney disease). Combining Tylenol with alcohol can substantially increase your risk of causing liver disease. Narcotic pain medications, if prescribed, can be used for severe pain, though may cause nausea, constipation, and drowsiness. Do not combine Tylenol and Norco within a 6 hour period as Norco contains Tylenol. If you do not need the narcotic pain medication, you do not need to fill the prescription. ° °Call office ([phone]) at any time if any questions, worsening pain, fevers/chills, bleeding, drainage from incision site, or other concerns. ° °AMBULATORY SURGERY  °DISCHARGE INSTRUCTIONS ° ° °1) The drugs that you were given will stay in your system until tomorrow so for the next 24 hours you should not: ° °A) Drive an automobile °B) Make any legal decisions °C) Drink any alcoholic beverage ° ° °2) You may resume regular meals tomorrow.  Today it is better to start with liquids and gradually work up to solid foods. ° °You may eat anything you prefer, but it is better to start with liquids, then soup and crackers, and gradually work up to solid foods. ° ° °3) Please notify your doctor immediately if you have any unusual bleeding, trouble breathing, redness and pain at the surgery site, drainage, fever, or pain not relieved by medication. ° ° ° °4) Additional Instructions: ° ° ° ° ° ° ° °Please contact your physician with any problems or Same Day Surgery at [phone], Monday through Friday 6 am to 4 pm, or [place] at  Main  number at [phone]. °

## 2019-03-01 NOTE — Anesthesia Postprocedure Evaluation (Signed)
Anesthesia Post Note  Patient: Lauren Page  Procedure(s) Performed: LEFT BREAST EXCISIONAL BIOPSY WITH NEEDLE LOCALIZATION (Left Breast)  Patient location during evaluation: PACU Anesthesia Type: General Level of consciousness: awake and alert and oriented Pain management: pain level controlled Vital Signs Assessment: post-procedure vital signs reviewed and stable Respiratory status: spontaneous breathing, nonlabored ventilation and respiratory function stable Cardiovascular status: blood pressure returned to baseline and stable Postop Assessment: no signs of nausea or vomiting Anesthetic complications: no     Last Vitals:  Vitals:   03/01/19 1500 03/01/19 1511  BP: 110/62 (!) 109/57  Pulse: 93 84  Resp:  14  Temp: (!) 36.2 C   SpO2: 100% 100%    Last Pain:  Vitals:   03/01/19 0916  TempSrc: Temporal  PainSc: 0-No pain                 Yovan Leeman

## 2019-03-01 NOTE — Op Note (Signed)
Preoperative diagnosis: Left breast intraductal papilloma.  Postoperative diagnosis: Left breast intraductal papilloma.   Procedure: Left needle-localized breast excisional biopsy.                      Anesthesia: GETA  Surgeon: Dr. Windell Moment  Wound Classification: Clean  Indications: Patient is a 67 y.o. female with a nonpalpable left distortion seen on MRI with core biopsy demonstrating discordant fibrocystic changes with nipple discharge. Requires needle-localized excisional biopsy for diagnostic and therapeutic purposes.   Findings: 1. Specimen mammography shows marker and wire on specimen 2. Pathology call refers gross examination of margins was  Biopsy sites identified 3. No other palpable mass or lymph node identified.   Description of procedure: Preoperative needle localization was performed by radiology. Localization studies were reviewed. The patient was taken to the operating room and placed supine on the operating table, and after general anesthesia the left chest was prepped and draped in the usual sterile fashion. A time-out was completed verifying correct patient, procedure, site, positioning, and implant(s) and/or special equipment prior to beginning this procedure.  By comparing the localization studies with the direction and skin entry site of the needle, the probable trajectory and location of the mass was visualized. A circumareolar skin incision was planned in such a way as to minimize the amount of dissection to reach the mass.  The skin incision was made. Flaps were raised and the location of the wire confirmed. The wire was delivered into the wound. A 2-0 silk figure-of-eight stay suture was placed around the wire and used for retraction. Dissection was then taken down circumferentially, taking care to include the entire localizing needle and a wide margin of grossly normal tissue. The specimen and entire localizing wire were removed. The specimen was oriented and  sent to radiology with the localization studies. Confirmation was received that the entire target lesion had been resected. The wound was irrigated. Hemostasis was checked. The wound was closed with interrupted sutures of 3-0 Vicryl and a subcuticular suture of Monocryl 4-0. No attempt was made to close the dead space. A dressing was applied.   Specimen: Left Breast central duct excision (Orientation markers used: Cranial, Medial, Deep)                   Complications: None  Estimated Blood Loss: 10 mL

## 2019-03-01 NOTE — Interval H&P Note (Signed)
History and Physical Interval Note:  03/01/2019 12:51 PM  Lauren Page  has presented today for surgery, with the diagnosis of N64.52 BLOODY DISCHARGE FROM LEFT NIPPLE.  The various methods of treatment have been discussed with the patient and family. After consideration of risks, benefits and other options for treatment, the patient has consented to  Procedure(s): LEFT BREAST EXCISIONAL BIOPSY WITH NEEDLE LOCALIZATION (Left) as a surgical intervention.  The patient's history has been reviewed, patient examined, no change in status, stable for surgery.  I have reviewed the patient's chart and labs.  Left breast marked in the pre procedure room. Questions were answered to the patient's satisfaction.     Herbert Pun

## 2019-03-01 NOTE — Anesthesia Procedure Notes (Signed)
Procedure Name: LMA Insertion Performed by: Kelton Pillar, CRNA Pre-anesthesia Checklist: Patient identified, Emergency Drugs available, Suction available and Patient being monitored Patient Re-evaluated:Patient Re-evaluated prior to induction Oxygen Delivery Method: Circle system utilized Preoxygenation: Pre-oxygenation with 100% oxygen Induction Type: IV induction Ventilation: Mask ventilation without difficulty LMA: LMA inserted LMA Size: 3.0 Tube type: Oral Number of attempts: 1 Placement Confirmation: positive ETCO2,  CO2 detector and breath sounds checked- equal and bilateral Tube secured with: Tape Dental Injury: Teeth and Oropharynx as per pre-operative assessment

## 2019-03-01 NOTE — Anesthesia Post-op Follow-up Note (Signed)
Anesthesia QCDR form completed.        

## 2019-03-01 NOTE — Transfer of Care (Signed)
Immediate Anesthesia Transfer of Care Note  Patient: Lauren Page  Procedure(s) Performed: LEFT BREAST EXCISIONAL BIOPSY WITH NEEDLE LOCALIZATION (Left Breast)  Patient Location: PACU  Anesthesia Type:General  Level of Consciousness: awake, alert , oriented and patient cooperative  Airway & Oxygen Therapy: Patient Spontanous Breathing and Patient connected to face mask oxygen  Post-op Assessment: Report given to RN and Post -op Vital signs reviewed and stable  Post vital signs: Reviewed and stable  Last Vitals:  Vitals Value Taken Time  BP 110/62 03/01/19 1501  Temp    Pulse 84 03/01/19 1504  Resp 13 03/01/19 1504  SpO2 100 % 03/01/19 1504  Vitals shown include unvalidated device data.  Last Pain:  Vitals:   03/01/19 0916  TempSrc: Temporal  PainSc: 0-No pain         Complications: No apparent anesthesia complications

## 2019-03-01 NOTE — Anesthesia Preprocedure Evaluation (Addendum)
Anesthesia Evaluation  Patient identified by MRN, date of birth, ID band Patient awake    Reviewed: Allergy & Precautions, H&P , NPO status , Patient's Chart, lab work & pertinent test results  Airway Mallampati: II  TM Distance: >3 FB     Dental  (+) Teeth Intact   Pulmonary shortness of breath and with exertion, asthma , neg recent URI,  Lung cancer          Cardiovascular hypertension, (-) angina(-) Past MI      Neuro/Psych negative neurological ROS  negative psych ROS   GI/Hepatic Neg liver ROS, GERD  Controlled,  Endo/Other  negative endocrine ROS  Renal/GU      Musculoskeletal   Abdominal   Peds  Hematology  (+) Blood dyscrasia, anemia ,   Anesthesia Other Findings Past Medical History: No date: Acid reflux No date: Anemia     Comment:  HAD TO HAVE IRON INFUSIONS BACK IN 2015 No date: Arthritis     Comment:  bil hands and back No date: Asthma 2015: Cancer of right lung (HCC)     Comment:  lung cancer - chemo, Dr Genevive Bi removed RML Lobectomy.  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 Donnella Sham 2007: KNEE SURGERY No date: LUNG LOBECTOMY; Right     Comment:  middle lobe 02/09/2017: PORTACATH PLACEMENT; Right     Comment:  Procedure: INSERTION PORT-A-CATH;  Surgeon: Nestor Lewandowsky, MD;  Location: [facility] ORS;  Service: General;                Laterality: Right; 1983: SALIVARY GLAND SURGERY; Left    Comment:  with lymph node removal also 2008: UPPER GI ENDOSCOPY 01/24/2017: VIDEO BRONCHOSCOPY WITH ENDOBRONCHIAL ULTRASOUND; Right     Comment:  Procedure: VIDEO BRONCHOSCOPY WITH ENDOBRONCHIAL               ULTRASOUND;  Surgeon: [name], MD;                Location: [facility] ORS;  Service: Pulmonary;  Laterality:               Right;  BMI    Body Mass Index: 23.00 kg/m      Reproductive/Obstetrics negative OB ROS                           Anesthesia Physical Anesthesia Plan  ASA: III  Anesthesia Plan: General LMA   Post-op Pain Management:    Induction:   PONV Risk Score and Plan: Dexamethasone, Ondansetron and Treatment may vary due to age or medical condition  Airway Management Planned:   Additional Equipment:   Intra-op Plan:   Post-operative Plan:   Informed Consent: I have reviewed the patients History and Physical, chart, labs and discussed the procedure including the risks, benefits and  alternatives for the proposed anesthesia with the patient or authorized representative who has indicated his/her understanding and acceptance.     Dental Advisory Given  Plan Discussed with: Anesthesiologist and CRNA  Anesthesia Plan Comments:        Anesthesia Quick Evaluation

## 2019-03-03 ENCOUNTER — Encounter: Payer: Self-pay | Admitting: General Surgery

## 2019-03-06 LAB — SURGICAL PATHOLOGY

## 2019-03-08 DIAGNOSIS — C3490 Malignant neoplasm of unspecified part of unspecified bronchus or lung: Secondary | ICD-10-CM

## 2019-03-08 DIAGNOSIS — I341 Nonrheumatic mitral (valve) prolapse: Secondary | ICD-10-CM | POA: Insufficient documentation

## 2019-03-08 NOTE — Progress Notes (Signed)
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  INTERVAL HISTORY: Patient returns to clinic today for repeat laboratory work and discussion of her imaging results.  She recently underwent left breast lumpectomy for benign papilloma.  Since surgery, the bloody discharge from her nipple has resolved.  She has no neurologic complaints.  She denies any recent fevers or illnesses.  She has a good appetite and denies weight loss.  She denies any chest pain or hemoptysis, but admits to persistent and chronic shortness of breath and cough.  She denies any nausea, vomiting, constipation, or diarrhea.  She has no urinary complaints.  Patient offers no further specific complaints today.  REVIEW OF SYSTEMS:   Review of Systems  Constitutional: Negative.  Negative for fever, malaise/fatigue and weight loss.  Respiratory: Positive for cough and shortness of breath. Negative for hemoptysis.   Cardiovascular: Negative.  Negative for chest pain and leg swelling.  Gastrointestinal: Negative for abdominal pain, constipation, diarrhea and nausea.  Genitourinary: Negative for dysuria and flank pain.  Musculoskeletal: Positive for back pain and joint pain.  Skin: Negative.  Negative for rash.  Neurological: Negative.  Negative for sensory change, focal weakness, weakness and headaches.  Psychiatric/Behavioral: Negative.  The patient is not nervous/anxious.     As per HPI. Otherwise, a complete review of systems is negative.  PAST MEDICAL HISTORY: Past Medical History:  Diagnosis Date   Acid reflux    Anemia    HAD TO HAVE IRON INFUSIONS BACK IN 2015   Arthritis    bil hands and back   Asthma    Cancer of right lung ([place]) 2015   lung cancer - chemo, Dr Genevive Bi  removed RML Lobectomy.    Dyspnea    WITH EXERTION   Hypertension    Mitral valve prolapse    Personal history of chemotherapy    F/U Lung Cancer   Personal history of radiation therapy    Lung cancer   Primary cancer of right middle lobe of lung ([place]) 03/11/2016    PAST SURGICAL HISTORY: Past Surgical History:  Procedure Laterality Date   ABDOMINAL HYSTERECTOMY     BREAST BIOPSY Left 03/01/2019   Procedure: LEFT BREAST EXCISIONAL BIOPSY WITH NEEDLE LOCALIZATION;  Surgeon: Herbert Pun, MD;  Location: [facility] OR[facility];  Service: General;  Laterality: Left;   BREAST EXCISIONAL BIOPSY Left 1994 (-), 2004 (-)   benign   BREAST EXCISIONAL BIOPSY Left 03/01/2019   path pending x 2   COLONOSCOPY  2008    Dr. Donnella Sham   KNEE SURGERY  2007   LUNG LOBECTOMY Right    middle lobe   PORTACATH PLACEMENT Right 02/09/2017   Procedure: INSERTION PORT-A-CATH;  Surgeon: Nestor Lewandowsky, MD;  Location: [facility];  Service: General;  Laterality: Right;   SALIVARY GLAND SURGERY Left 1983   with lymph node removal also   UPPER GI ENDOSCOPY  2008   VIDEO BRONCHOSCOPY WITH ENDOBRONCHIAL ULTRASOUND Right 01/24/2017   Procedure: VIDEO BRONCHOSCOPY WITH ENDOBRONCHIAL ULTRASOUND;  Surgeon: [name], MD;  Location: [facility];  Service: Pulmonary;  Laterality: Right;    FAMILY HISTORY: Family History  Problem Relation Age of Onset   Breast cancer Maternal Aunt    Breast cancer Paternal Aunt    Breast cancer Maternal Grandmother  Breast cancer Maternal Aunt    Breast cancer Paternal Aunt    Healthy Mother    Healthy Father     ADVANCED DIRECTIVES (Y/N):  N  HEALTH MAINTENANCE: Social History   Tobacco Use   Smoking status: Never Smoker   Smokeless tobacco: Never Used  Substance Use Topics   Alcohol use: Yes    Alcohol/week: 7.0 standard drinks    Types: 7 Glasses of wine per week   Drug use: No     Colonoscopy:  PAP:  Bone density:  Lipid  panel:  Allergies  Allergen Reactions   Other     Cat gut sutures get infected   Penicillins Swelling and Rash    Did it involve swelling of the face/tongue/throat, SOB, or low BP? Unknown Did it involve sudden or severe rash/hives, skin peeling, or any reaction on the inside of your mouth or nose? No Did you need to seek medical attention at a hospital or doctor's office? Yes When did it last happen?30 + years  If all above answers are NO, may proceed with cephalosporin use.     Sulfa Antibiotics Nausea And Vomiting    Current Outpatient Medications  Medication Sig Dispense Refill   albuterol (PROVENTIL HFA;VENTOLIN HFA) 108 (90 Base) MCG/ACT inhaler Inhale 1-2 puffs into the lungs every 6 (six) hours as needed for wheezing or shortness of breath.     B Complex Vitamins (VITAMIN B COMPLEX PO) Take 1 tablet by mouth daily.      Biotin w/ Vitamins C & E (HAIR/SKIN/NAILS PO) Take 1 tablet by mouth daily.     Calcium Carb-Cholecalciferol (CALCIUM 600+D) 600-800 MG-UNIT TABS Take 2 tablets by mouth daily.     Cholecalciferol (D3 MAXIMUM STRENGTH) 5000 units capsule Take 5,000 Units by mouth daily.     COLLAGEN PO Take 1,000 mg by mouth daily.     fexofenadine (ALLEGRA) 180 MG tablet Take 180 mg by mouth daily as needed for allergies.      fluticasone (FLONASE) 50 MCG/ACT nasal spray Place 2 sprays into both nostrils daily as needed for allergies or rhinitis.     Fluticasone-Salmeterol (ADVAIR) 250-50 MCG/DOSE AEPB Inhale 1 puff into the lungs 2 (two) times daily as needed (for respiratory issues.).     lansoprazole (PREVACID) 30 MG capsule Take 30 mg by mouth daily.      lidocaine-prilocaine (EMLA) cream Apply 1 application topically as needed. Apply generously over the Mediport 45 minutes prior to chemotherapy. 30 g 0   losartan (COZAAR) 25 MG tablet Take 25 mg by mouth at bedtime.      metoCLOPramide (REGLAN) 10 MG tablet Take 1 tablet (10 mg total) by mouth every  8 (eight) hours as needed for nausea. 40 tablet 3   mometasone (ELOCON) 0.1 % lotion Apply 1 application topically as needed.      mupirocin ointment (BACTROBAN) 2 % Apply 1 application topically as needed.   0   ondansetron (ZOFRAN) 8 MG tablet Take 1 tablet (8 mg total) by mouth every 8 (eight) hours as needed for nausea or vomiting (start 3 days; after chemo). 40 tablet 1   osimertinib mesylate (TAGRISSO) 80 MG tablet Take 1 tablet (80 mg total) by mouth daily. 30 tablet 5   polyethylene glycol (MIRALAX / GLYCOLAX) packet Take 17 g by mouth daily as needed for mild constipation.     PREMARIN 1.25 MG tablet Take 1.25 mg by mouth daily.   1   prochlorperazine (COMPAZINE) 10 MG tablet  Take 1 tablet (10 mg total) by mouth every 6 (six) hours as needed for nausea or vomiting. 30 tablet 0   pseudoephedrine-guaifenesin (MUCINEX D) 60-600 MG 12 hr tablet Take 1 tablet by mouth every 12 (twelve) hours as needed.      triamcinolone (KENALOG) 0.1 % paste Use as directed 1 application in the mouth or throat 2 (two) times daily. 5 g 12   Vitamin D, Ergocalciferol, (DRISDOL) 50000 units CAPS capsule Take 50,000 Units by mouth every Friday.      No current facility-administered medications for this visit.    Facility-Administered Medications Ordered in Other Visits  Medication Dose Route Frequency Provider Last Rate Last Dose   heparin lock flush 100 unit/mL  500 Units Intravenous Once Grayland Ormond, Kathlene November, MD       sodium chloride flush (NS) 0.9 % injection 10 mL  10 mL Intravenous PRN Lloyd Huger, MD       sodium chloride flush (NS) 0.9 % injection 10 mL  10 mL Intravenous PRN Lloyd Huger, MD   10 mL at 01/22/18 1419    OBJECTIVE: There were no vitals filed for this visit.   There is no height or weight on file to calculate BMI.    ECOG FS:0 - Asymptomatic  General: Well-developed, well-nourished, no acute distress. Eyes: Pink conjunctiva, anicteric sclera. HEENT:  Normocephalic, moist mucous membranes. Lungs: Clear to auscultation bilaterally. Heart: Regular rate and rhythm. No rubs, murmurs, or gallops. Abdomen: Soft, nontender, nondistended. No organomegaly noted, normoactive bowel sounds. Musculoskeletal: No edema, cyanosis, or clubbing. Neuro: Alert, answering all questions appropriately. Cranial nerves grossly intact. Skin: No rashes or petechiae noted. Psych: Normal affect.  LAB RESULTS:  Lab Results  Component Value Date   NA 132 (L) 03/19/2019   K 4.3 03/19/2019   CL 99 03/19/2019   CO2 24 03/19/2019   GLUCOSE 98 03/19/2019   BUN 19 03/19/2019   CREATININE 0.80 03/19/2019   CALCIUM 8.9 03/19/2019   PROT 7.3 03/19/2019   ALBUMIN 3.7 03/19/2019   AST 28 03/19/2019   ALT 21 03/19/2019   ALKPHOS 57 03/19/2019   BILITOT 0.7 03/19/2019   GFRNONAA >60 03/19/2019   GFRAA >60 03/19/2019    Lab Results  Component Value Date   WBC 4.0 03/19/2019   NEUTROABS 2.8 03/19/2019   HGB 11.1 (L) 03/19/2019   HCT 33.7 (L) 03/19/2019   MCV 90.8 03/19/2019   PLT 207 03/19/2019     STUDIES: Ct Chest W Contrast  Result Date: 03/19/2019 CLINICAL DATA:  Restaging recurrent lung cancer, status post right middle lobectomy EXAM: CT CHEST, ABDOMEN, AND PELVIS WITH CONTRAST TECHNIQUE: Multidetector CT imaging of the chest, abdomen and pelvis was performed following the standard protocol during bolus administration of intravenous contrast. CONTRAST:  132m OMNIPAQUE IOHEXOL 300 MG/ML SOLN, additional oral enteric contrast COMPARISON:  CT chest, 07/30/2018, PET-CT, 04/17/2018 FINDINGS: CT CHEST FINDINGS Cardiovascular: Minimal, scattered aortic atherosclerosis. Normal heart size. No pericardial effusion. Right chest port catheter. Mediastinum/Nodes: No enlarged mediastinal, hilar, or axillary lymph nodes. Thyroid gland, trachea, and esophagus demonstrate no significant findings. Lungs/Pleura: Status post right middle lobectomy. No significant change in  dense perihilar and suprahilar post treatment fibrosis and volume loss of the right lung. There is improved aeration of the right lower lobe and interval reduction in volume of a now small right pleural effusion. There is new subpleural nodularity about the lateral right upper lobe, the largest nodule measuring 0.9 x 0.5 cm (series 4, image 66).  Mild biapical pleuroparenchymal scarring. Benign, calcified small nodule of the right upper lobe (series 4, image 51). Musculoskeletal: No chest wall mass or suspicious bone lesions identified. There is a large fluid attenuation hematoma or seroma of the left breast in keeping with recent lumpectomy. CT ABDOMEN PELVIS FINDINGS Hepatobiliary: No solid liver abnormality is seen. No gallstones, gallbladder wall thickening, or biliary dilatation. Pancreas: Unremarkable. No pancreatic ductal dilatation or surrounding inflammatory changes. Spleen: Normal in size without significant abnormality. Adrenals/Urinary Tract: Adrenal glands are unremarkable. Kidneys are normal, without renal calculi, solid lesion, or hydronephrosis. Bladder is unremarkable. Stomach/Bowel: Stomach is within normal limits. Appendix appears normal. No evidence of bowel wall thickening, distention, or inflammatory changes. Vascular/Lymphatic: No significant vascular findings are present. No enlarged abdominal or pelvic lymph nodes. Reproductive: Status post hysterectomy. Other: No abdominal wall hernia or abnormality. No abdominopelvic ascites. Musculoskeletal: No acute or significant osseous findings. IMPRESSION: 1. Status post right middle lobectomy. No significant change in dense perihilar and suprahilar post treatment fibrosis and volume loss of the right lung. 2. There is improved aeration of the right lower lobe and interval reduction in volume of a now small right pleural effusion. 3. There is new subpleural nodularity about the lateral right upper lobe, the largest nodule measuring 0.9 x 0.5 cm  (series 4, image 66). This may reflect small foci of atelectasis, however nodular pleural metastatic disease is not excluded. PET-CT may be used to assess for metabolic activity. 4. No evidence of distant metastatic disease in the abdomen or pelvis. 5. There is a large fluid attenuation hematoma or seroma of the left breast in keeping with recent lumpectomy. Electronically Signed   By: Eddie Candle M.D.   On: 03/19/2019 13:45   Ct Abdomen Pelvis W Contrast  Result Date: 03/19/2019 CLINICAL DATA:  Restaging recurrent lung cancer, status post right middle lobectomy EXAM: CT CHEST, ABDOMEN, AND PELVIS WITH CONTRAST TECHNIQUE: Multidetector CT imaging of the chest, abdomen and pelvis was performed following the standard protocol during bolus administration of intravenous contrast. CONTRAST:  178m OMNIPAQUE IOHEXOL 300 MG/ML SOLN, additional oral enteric contrast COMPARISON:  CT chest, 07/30/2018, PET-CT, 04/17/2018 FINDINGS: CT CHEST FINDINGS Cardiovascular: Minimal, scattered aortic atherosclerosis. Normal heart size. No pericardial effusion. Right chest port catheter. Mediastinum/Nodes: No enlarged mediastinal, hilar, or axillary lymph nodes. Thyroid gland, trachea, and esophagus demonstrate no significant findings. Lungs/Pleura: Status post right middle lobectomy. No significant change in dense perihilar and suprahilar post treatment fibrosis and volume loss of the right lung. There is improved aeration of the right lower lobe and interval reduction in volume of a now small right pleural effusion. There is new subpleural nodularity about the lateral right upper lobe, the largest nodule measuring 0.9 x 0.5 cm (series 4, image 66). Mild biapical pleuroparenchymal scarring. Benign, calcified small nodule of the right upper lobe (series 4, image 51). Musculoskeletal: No chest wall mass or suspicious bone lesions identified. There is a large fluid attenuation hematoma or seroma of the left breast in keeping with  recent lumpectomy. CT ABDOMEN PELVIS FINDINGS Hepatobiliary: No solid liver abnormality is seen. No gallstones, gallbladder wall thickening, or biliary dilatation. Pancreas: Unremarkable. No pancreatic ductal dilatation or surrounding inflammatory changes. Spleen: Normal in size without significant abnormality. Adrenals/Urinary Tract: Adrenal glands are unremarkable. Kidneys are normal, without renal calculi, solid lesion, or hydronephrosis. Bladder is unremarkable. Stomach/Bowel: Stomach is within normal limits. Appendix appears normal. No evidence of bowel wall thickening, distention, or inflammatory changes. Vascular/Lymphatic: No significant vascular findings are present. No  enlarged abdominal or pelvic lymph nodes. Reproductive: Status post hysterectomy. Other: No abdominal wall hernia or abnormality. No abdominopelvic ascites. Musculoskeletal: No acute or significant osseous findings. IMPRESSION: 1. Status post right middle lobectomy. No significant change in dense perihilar and suprahilar post treatment fibrosis and volume loss of the right lung. 2. There is improved aeration of the right lower lobe and interval reduction in volume of a now small right pleural effusion. 3. There is new subpleural nodularity about the lateral right upper lobe, the largest nodule measuring 0.9 x 0.5 cm (series 4, image 66). This may reflect small foci of atelectasis, however nodular pleural metastatic disease is not excluded. PET-CT may be used to assess for metabolic activity. 4. No evidence of distant metastatic disease in the abdomen or pelvis. 5. There is a large fluid attenuation hematoma or seroma of the left breast in keeping with recent lumpectomy. Electronically Signed   By: Eddie Candle M.D.   On: 03/19/2019 13:45   Mm Breast Surgical Specimen  Result Date: 03/01/2019 CLINICAL DATA:  Patient status post left breast surgical excision EXAM: SPECIMEN RADIOGRAPH OF THE LEFT BREAST COMPARISON:  Previous exam(s).  FINDINGS: Status post excision of the left breast. The wire tips and biopsy marker clips are present and are marked for pathology. IMPRESSION: Specimen radiograph of the left breast. Electronically Signed   By: Lovey Newcomer M.D.   On: 03/01/2019 14:25   Mm Lt Plc Breast Loc Dev   1st Lesion  Inc Mammo Guide  Result Date: 03/01/2019 CLINICAL DATA:  Patient for preoperative localization prior to left breast excision. EXAM: NEEDLE LOCALIZATION OF THE LEFT BREAST WITH MAMMO GUIDANCE COMPARISON:  Previous exams. FINDINGS: Patient presents for needle localization prior to left breast excision. I met with the patient and we discussed the procedure of needle localization including benefits and alternatives. We discussed the high likelihood of a successful procedure. We discussed the risks of the procedure, including infection, bleeding, tissue injury, and further surgery. Informed, written consent was given. The usual time-out protocol was performed immediately prior to the procedure. Site 1: Cylinder-shaped clip: Using mammographic guidance, sterile technique, 1% lidocaine and a 5 cm modified Kopans needle, cylinder-shaped clip was localized using inferior approach. The images were marked for Dr. Windell Moment. Site 2: Dumbbell-shaped clip: Using mammographic guidance, sterile technique, 1% lidocaine and a 7 cm modified Kopans needle, dumbbell-shaped clip was localized using inferior approach. The images were marked for Dr. Windell Moment. IMPRESSION: Needle localization left breast. No apparent complications. Electronically Signed   By: Lovey Newcomer M.D.   On: 03/01/2019 09:10   Mm Lt Plc Breast Loc Dev   Ea Add Lesion  Inc Mammo Guide  Result Date: 03/01/2019 CLINICAL DATA:  Patient for preoperative localization prior to left breast excision. EXAM: NEEDLE LOCALIZATION OF THE LEFT BREAST WITH MAMMO GUIDANCE COMPARISON:  Previous exams. FINDINGS: Patient presents for needle localization prior to left breast excision. I  met with the patient and we discussed the procedure of needle localization including benefits and alternatives. We discussed the high likelihood of a successful procedure. We discussed the risks of the procedure, including infection, bleeding, tissue injury, and further surgery. Informed, written consent was given. The usual time-out protocol was performed immediately prior to the procedure. Site 1: Cylinder-shaped clip: Using mammographic guidance, sterile technique, 1% lidocaine and a 5 cm modified Kopans needle, cylinder-shaped clip was localized using inferior approach. The images were marked for Dr. Windell Moment. Site 2: Dumbbell-shaped clip: Using mammographic guidance, sterile technique, 1%  lidocaine and a 7 cm modified Kopans needle, dumbbell-shaped clip was localized using inferior approach. The images were marked for Dr. Windell Moment. IMPRESSION: Needle localization left breast. No apparent complications. Electronically Signed   By: Lovey Newcomer M.D.   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  She initially received 4 cycles of adjuvant cisplatin and Alimta completing on January 23, 2014.  Patient was on the [facility] protocol for maintenance Tarceva versus placebo, but discontinued treatment in November 2015 because of significant side effects.  Although blinded, presumption was she was receiving Tarceva.  She recently was noted to have a recurrence confirmed by right paratracheal lymph node biopsy on January 24, 2017.  PET scan on February 03, 2017 did not reveal any distant metastasis.  She underwent treatment with weekly carboplatinum and Taxol along with daily XRT finishing in October 2018.  She initiated maintenance durvalumab on June 29, 2017.  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  Patient started on Tagrisso in April 2019.   ASSESSMENT: Recurrent  stage IV adenocarcinoma of the lung.  EGFR mutation positive.  PLAN:   1. 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  Patient initiated Tagrisso in April 2019.  CT scan results from March 19, 2019 reviewed independently and reported as above with no obvious evidence of recurrent or progressive disease.  Patient was noted to have a right upper lobe nodule measuring 9 mm.  We once again discussed dose reducing her Tagrisso from 80 mg to 40 mg, but patient declined and wishes to continue her current treatment as prescribed.  Return to clinic in 3 months with repeat imaging and further evaluation. 2.  Diarrhea: Chronic and unchanged.  Continue Lomotil as prescribed. 3.  Cough: Chronic and unchanged, monitor. 4.  Fingernail cracking: Possibly secondary to [place].  Improved. 5.  Breast lesion: Benign papilloma.  Patient underwent lumpectomy on March 01, 2019. 6.  Back/right hip pain: No obvious etiology on CT scan.  Monitor. 7.  Right upper lobe nodule: Repeat CT scan of chest in 3 months.   Patient expressed understanding and was in agreement with this plan. She also understands that She can call clinic at any time with any questions, concerns, or complaints.    Lloyd Huger, MD   03/21/2019 5:01 PM

## 2019-03-19 ENCOUNTER — Inpatient Hospital Stay: Payer: Medicare Other | Attending: Oncology

## 2019-03-19 ENCOUNTER — Other Ambulatory Visit: Payer: Self-pay

## 2019-03-19 ENCOUNTER — Ambulatory Visit
Admission: RE | Admit: 2019-03-19 | Discharge: 2019-03-19 | Disposition: A | Discharge status: Home / Self Care | Payer: Medicare Other | Source: Ambulatory Visit | Attending: Oncology | Admitting: Oncology

## 2019-03-19 ENCOUNTER — Other Ambulatory Visit: Payer: Medicare Other

## 2019-03-19 DIAGNOSIS — Z452 Encounter for adjustment and management of vascular access device: Secondary | ICD-10-CM | POA: Insufficient documentation

## 2019-03-19 DIAGNOSIS — C3491 Malignant neoplasm of unspecified part of right bronchus or lung: Secondary | ICD-10-CM | POA: Insufficient documentation

## 2019-03-19 DIAGNOSIS — R197 Diarrhea, unspecified: Secondary | ICD-10-CM | POA: Diagnosis not present

## 2019-03-19 DIAGNOSIS — M25551 Pain in right hip: Secondary | ICD-10-CM | POA: Insufficient documentation

## 2019-03-19 DIAGNOSIS — C3490 Malignant neoplasm of unspecified part of unspecified bronchus or lung: Secondary | ICD-10-CM | POA: Insufficient documentation

## 2019-03-19 DIAGNOSIS — Z95828 Presence of other vascular implants and grafts: Secondary | ICD-10-CM

## 2019-03-19 LAB — CBC WITH DIFFERENTIAL/PLATELET
Abs Immature Granulocytes: 0.01 K/uL (ref 0.00–0.07)
Basophils Absolute: 0 K/uL (ref 0.0–0.1)
Basophils Relative: 1 %
Eosinophils Absolute: 0.1 K/uL (ref 0.0–0.5)
Eosinophils Relative: 2 %
HCT: 33.7 % — ABNORMAL LOW (ref 36.0–46.0)
Hemoglobin: 11.1 g/dL — ABNORMAL LOW (ref 12.0–15.0)
Immature Granulocytes: 0 %
Lymphocytes Relative: 18 %
Lymphs Abs: 0.7 K/uL (ref 0.7–4.0)
MCH: 29.9 pg (ref 26.0–34.0)
MCHC: 32.9 g/dL (ref 30.0–36.0)
MCV: 90.8 fL (ref 80.0–100.0)
Monocytes Absolute: 0.4 K/uL (ref 0.1–1.0)
Monocytes Relative: 10 %
Neutro Abs: 2.8 K/uL (ref 1.7–7.7)
Neutrophils Relative %: 69 %
Platelets: 207 K/uL (ref 150–400)
RBC: 3.71 MIL/uL — ABNORMAL LOW (ref 3.87–5.11)
RDW: 14.7 % (ref 11.5–15.5)
WBC: 4 K/uL (ref 4.0–10.5)
nRBC: 0 % (ref 0.0–0.2)

## 2019-03-19 LAB — COMPREHENSIVE METABOLIC PANEL WITH GFR
ALT: 21 U/L (ref 0–44)
AST: 28 U/L (ref 15–41)
Albumin: 3.7 g/dL (ref 3.5–5.0)
Alkaline Phosphatase: 57 U/L (ref 38–126)
Anion gap: 9 (ref 5–15)
BUN: 19 mg/dL (ref 8–23)
CO2: 24 mmol/L (ref 22–32)
Calcium: 8.9 mg/dL (ref 8.9–10.3)
Chloride: 99 mmol/L (ref 98–111)
Creatinine, Ser: 0.8 mg/dL (ref 0.44–1.00)
GFR calc Af Amer: >60 mL/min
GFR calc non Af Amer: >60 mL/min
Glucose, Bld: 98 mg/dL (ref 70–99)
Potassium: 4.3 mmol/L (ref 3.5–5.1)
Sodium: 132 mmol/L — ABNORMAL LOW (ref 135–145)
Total Bilirubin: 0.7 mg/dL (ref 0.3–1.2)
Total Protein: 7.3 g/dL (ref 6.5–8.1)

## 2019-03-19 MED ORDER — SODIUM CHLORIDE 0.9% FLUSH
10.0000 mL | Freq: Once | INTRAVENOUS | Status: AC
  Administered 2019-03-19: 12:00:00 10 mL via INTRAVENOUS
  Filled 2019-03-19: qty 10

## 2019-03-19 MED ORDER — IOHEXOL 300 MG/ML  SOLN
100.0000 mL | Freq: Once | INTRAMUSCULAR | Status: AC | PRN
  Administered 2019-03-19: 100 mL via INTRAVENOUS

## 2019-03-19 MED ORDER — HEPARIN SOD (PORK) LOCK FLUSH 100 UNIT/ML IV SOLN
500.0000 [IU] | Freq: Once | INTRAVENOUS | Status: AC
  Administered 2019-03-19: 13:00:00 500 [IU] via INTRAVENOUS

## 2019-03-20 ENCOUNTER — Telehealth: Payer: Self-pay

## 2019-03-20 NOTE — Telephone Encounter (Signed)
Called Ms. Barna to complete a pre-visit assessment prior to [place] clinic appointment on 03/21/2019. Left msg.

## 2019-03-21 ENCOUNTER — Ambulatory Visit: Payer: Medicare Other | Admitting: Oncology

## 2019-03-21 ENCOUNTER — Other Ambulatory Visit: Payer: Self-pay

## 2019-03-21 ENCOUNTER — Inpatient Hospital Stay (HOSPITAL_BASED_OUTPATIENT_CLINIC_OR_DEPARTMENT_OTHER): Payer: Medicare Other | Admitting: Oncology

## 2019-03-21 DIAGNOSIS — C3490 Malignant neoplasm of unspecified part of unspecified bronchus or lung: Secondary | ICD-10-CM

## 2019-03-21 DIAGNOSIS — C3491 Malignant neoplasm of unspecified part of right bronchus or lung: Secondary | ICD-10-CM | POA: Diagnosis not present

## 2019-04-09 ENCOUNTER — Other Ambulatory Visit
Admission: RE | Admit: 2019-04-09 | Discharge: 2019-04-09 | Disposition: A | Discharge status: Home / Self Care | Payer: Medicare Other | Source: Ambulatory Visit | Attending: Gastroenterology | Admitting: Gastroenterology

## 2019-04-09 ENCOUNTER — Other Ambulatory Visit: Payer: Self-pay

## 2019-04-09 DIAGNOSIS — Z01812 Encounter for preprocedural laboratory examination: Secondary | ICD-10-CM | POA: Diagnosis present

## 2019-04-09 DIAGNOSIS — K5790 Diverticulosis of intestine, part unspecified, without perforation or abscess without bleeding: Secondary | ICD-10-CM | POA: Diagnosis not present

## 2019-04-09 DIAGNOSIS — Z20828 Contact with and (suspected) exposure to other viral communicable diseases: Secondary | ICD-10-CM | POA: Diagnosis not present

## 2019-04-09 LAB — SARS CORONAVIRUS 2 (TAT 6-24 HRS): SARS Coronavirus 2: NEGATIVE

## 2019-04-11 ENCOUNTER — Encounter: Payer: Self-pay | Admitting: *Deleted

## 2019-04-12 ENCOUNTER — Ambulatory Visit: Payer: Medicare Other | Admitting: Anesthesiology

## 2019-04-12 ENCOUNTER — Ambulatory Visit
Admission: RE | Admit: 2019-04-12 | Discharge: 2019-04-12 | Disposition: A | Discharge status: Home / Self Care | Payer: Medicare Other | Attending: Gastroenterology | Admitting: Gastroenterology

## 2019-04-12 ENCOUNTER — Encounter
Admission: RE | Disposition: A | Discharge status: Home / Self Care | Payer: Self-pay | Source: Home / Self Care | Attending: Gastroenterology

## 2019-04-12 ENCOUNTER — Other Ambulatory Visit: Payer: Self-pay

## 2019-04-12 DIAGNOSIS — Z79899 Other long term (current) drug therapy: Secondary | ICD-10-CM | POA: Insufficient documentation

## 2019-04-12 DIAGNOSIS — D630 Anemia in neoplastic disease: Secondary | ICD-10-CM | POA: Insufficient documentation

## 2019-04-12 DIAGNOSIS — K219 Gastro-esophageal reflux disease without esophagitis: Secondary | ICD-10-CM | POA: Diagnosis not present

## 2019-04-12 DIAGNOSIS — Z86010 Personal history of colonic polyps: Secondary | ICD-10-CM | POA: Insufficient documentation

## 2019-04-12 DIAGNOSIS — Z791 Long term (current) use of non-steroidal anti-inflammatories (NSAID): Secondary | ICD-10-CM | POA: Diagnosis not present

## 2019-04-12 DIAGNOSIS — Z7951 Long term (current) use of inhaled steroids: Secondary | ICD-10-CM | POA: Insufficient documentation

## 2019-04-12 DIAGNOSIS — J449 Chronic obstructive pulmonary disease, unspecified: Secondary | ICD-10-CM | POA: Insufficient documentation

## 2019-04-12 DIAGNOSIS — Z1211 Encounter for screening for malignant neoplasm of colon: Secondary | ICD-10-CM | POA: Diagnosis not present

## 2019-04-12 DIAGNOSIS — C342 Malignant neoplasm of middle lobe, bronchus or lung: Secondary | ICD-10-CM | POA: Insufficient documentation

## 2019-04-12 DIAGNOSIS — Z85118 Personal history of other malignant neoplasm of bronchus and lung: Secondary | ICD-10-CM | POA: Insufficient documentation

## 2019-04-12 DIAGNOSIS — Z923 Personal history of irradiation: Secondary | ICD-10-CM | POA: Insufficient documentation

## 2019-04-12 DIAGNOSIS — I10 Essential (primary) hypertension: Secondary | ICD-10-CM | POA: Insufficient documentation

## 2019-04-12 DIAGNOSIS — Z9221 Personal history of antineoplastic chemotherapy: Secondary | ICD-10-CM | POA: Insufficient documentation

## 2019-04-12 HISTORY — DX: Nipple discharge: N64.52

## 2019-04-12 HISTORY — PX: COLONOSCOPY WITH PROPOFOL: SHX5780

## 2019-04-12 HISTORY — DX: Abnormal findings on diagnostic imaging of heart and coronary circulation: R93.1

## 2019-04-12 HISTORY — DX: Chronic obstructive pulmonary disease, unspecified: J44.9

## 2019-04-12 HISTORY — DX: Allergic rhinitis, unspecified: J30.9

## 2019-04-12 HISTORY — DX: Polyp of colon: K63.5

## 2019-04-12 HISTORY — DX: Malignant neoplasm of middle lobe, bronchus or lung: C34.2

## 2019-04-12 HISTORY — DX: Malignant neoplasm of unspecified part of unspecified bronchus or lung: C34.90

## 2019-04-12 LAB — CBC WITH DIFFERENTIAL/PLATELET
Abs Immature Granulocytes: 0.01 10*3/uL (ref 0.00–0.07)
Basophils Absolute: 0 10*3/uL (ref 0.0–0.1)
Basophils Relative: 1 %
Eosinophils Absolute: 0.1 10*3/uL (ref 0.0–0.5)
Eosinophils Relative: 2 %
HCT: 35.5 % — ABNORMAL LOW (ref 36.0–46.0)
Hemoglobin: 11.5 g/dL — ABNORMAL LOW (ref 12.0–15.0)
Immature Granulocytes: 0 %
Lymphocytes Relative: 21 %
Lymphs Abs: 0.6 10*3/uL — ABNORMAL LOW (ref 0.7–4.0)
MCH: 29.6 pg (ref 26.0–34.0)
MCHC: 32.4 g/dL (ref 30.0–36.0)
MCV: 91.3 fL (ref 80.0–100.0)
Monocytes Absolute: 0.3 10*3/uL (ref 0.1–1.0)
Monocytes Relative: 9 %
Neutro Abs: 2.1 10*3/uL (ref 1.7–7.7)
Neutrophils Relative %: 67 %
Platelets: 175 10*3/uL (ref 150–400)
RBC: 3.89 MIL/uL (ref 3.87–5.11)
RDW: 14.8 % (ref 11.5–15.5)
WBC: 3.1 10*3/uL — ABNORMAL LOW (ref 4.0–10.5)
nRBC: 0 % (ref 0.0–0.2)

## 2019-04-12 LAB — PROTIME-INR
INR: 1 (ref 0.8–1.2)
Prothrombin Time: 13 seconds (ref 11.4–15.2)

## 2019-04-12 SURGERY — COLONOSCOPY WITH PROPOFOL
Anesthesia: General

## 2019-04-12 MED ORDER — HEPARIN SOD (PORK) LOCK FLUSH 100 UNIT/ML IV SOLN
250.0000 [IU] | Freq: Once | INTRAVENOUS | Status: AC
  Administered 2019-04-12: 250 [IU]

## 2019-04-12 MED ORDER — LIDOCAINE HCL (PF) 2 % IJ SOLN
INTRAMUSCULAR | Status: AC
  Filled 2019-04-12: qty 10

## 2019-04-12 MED ORDER — SODIUM CHLORIDE 0.9 % IV SOLN
INTRAVENOUS | Status: DC
  Administered 2019-04-12: 14:00:00 via INTRAVENOUS

## 2019-04-12 MED ORDER — PHENYLEPHRINE HCL (PRESSORS) 10 MG/ML IV SOLN
INTRAVENOUS | Status: DC | PRN
  Administered 2019-04-12 (×2): 100 ug via INTRAVENOUS

## 2019-04-12 MED ORDER — FENTANYL CITRATE (PF) 100 MCG/2ML IJ SOLN
INTRAMUSCULAR | Status: AC
  Filled 2019-04-12: qty 2

## 2019-04-12 MED ORDER — PROPOFOL 10 MG/ML IV BOLUS
INTRAVENOUS | Status: DC | PRN
  Administered 2019-04-12: 30 mg via INTRAVENOUS
  Administered 2019-04-12: 10 mg via INTRAVENOUS
  Administered 2019-04-12 (×2): 20 mg via INTRAVENOUS

## 2019-04-12 MED ORDER — HEPARIN SOD (PORK) LOCK FLUSH 100 UNIT/ML IV SOLN
INTRAVENOUS | Status: AC
  Filled 2019-04-12: qty 5

## 2019-04-12 MED ORDER — FENTANYL CITRATE (PF) 100 MCG/2ML IJ SOLN
INTRAMUSCULAR | Status: DC | PRN
  Administered 2019-04-12 (×4): 25 ug via INTRAVENOUS

## 2019-04-12 MED ORDER — LIDOCAINE HCL (PF) 2 % IJ SOLN
INTRAMUSCULAR | Status: DC | PRN
  Administered 2019-04-12: 60 mg

## 2019-04-12 MED ORDER — PROPOFOL 500 MG/50ML IV EMUL
INTRAVENOUS | Status: DC | PRN
  Administered 2019-04-12: 50 ug/kg/min via INTRAVENOUS

## 2019-04-12 MED ORDER — PROPOFOL 500 MG/50ML IV EMUL
INTRAVENOUS | Status: AC
  Filled 2019-04-12: qty 50

## 2019-04-12 NOTE — H&P (Signed)
Outpatient short stay form Pre-procedure 04/12/2019 1:50 PM Lollie Sails MD  Primary Physician: Dr. Fulton Reek  Reason for visit: Colonoscopy  History of present illness: Patient is a 68 year old female presenting today for colonoscopy in regards to her personal history of colon polyps.  Her last colonoscopy was 05/22/2012 with 3 tubular adenomas removed at that time.  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  She tolerated her prep well.  She takes no blood thinners or aspirin products.  Her hemogram was checked today as well as a pro time and these are good for procedure.    Current Facility-Administered Medications:  .  0.9 %  sodium chloride infusion, , Intravenous, Continuous, Lollie Sails, MD, Last Rate: 20 mL/hr at 04/12/19 1346 .  heparin lock flush 100 unit/mL, 250 Units, Intracatheter, Once, Molli Barrows, MD  Facility-Administered Medications Ordered in Other Encounters:  .  heparin lock flush 100 unit/mL, 500 Units, Intravenous, Once, Grayland Ormond, Kathlene November, MD .  sodium chloride flush (NS) 0.9 % injection 10 mL, 10 mL, Intravenous, PRN, Grayland Ormond, Kathlene November, MD .  sodium chloride flush (NS) 0.9 % injection 10 mL, 10 mL, Intravenous, PRN, Lloyd Huger, MD, 10 mL at 01/22/18 1419  Medications Prior to Admission  Medication Sig Dispense Refill Last Dose  . albuterol (PROVENTIL HFA;VENTOLIN HFA) 108 (90 Base) MCG/ACT inhaler Inhale 1-2 puffs into the lungs every 6 (six) hours as needed for wheezing or shortness of breath.   04/11/2019 at Unknown time  . B Complex Vitamins (VITAMIN B COMPLEX PO) Take 1 tablet by mouth daily.    Past Month at Unknown time  . Biotin w/ Vitamins C & E (HAIR/SKIN/NAILS PO) Take 1 tablet by mouth daily.   Past Month at Unknown time  . Calcium Carb-Cholecalciferol (CALCIUM 600+D) 600-800 MG-UNIT TABS Take 2 tablets by mouth daily.   Past Month at Unknown time  . Cholecalciferol (D3  MAXIMUM STRENGTH) 5000 units capsule Take 5,000 Units by mouth daily.   Past Month at Unknown time  . COLLAGEN PO Take 1,000 mg by mouth daily.   Past Month at Unknown time  . cyclobenzaprine (FLEXERIL) 5 MG tablet Take 5 mg by mouth 3 (three) times daily as needed for muscle spasms.   Past Week at Unknown time  . doxycycline (VIBRA-TABS) 100 MG tablet Take 100 mg by mouth 2 (two) times daily.   Past Week at Unknown time  . fexofenadine (ALLEGRA) 180 MG tablet Take 180 mg by mouth daily as needed for allergies.    Past Week at Unknown time  . fluticasone (FLONASE) 50 MCG/ACT nasal spray Place 2 sprays into both nostrils daily as needed for allergies or rhinitis.   Past Week at Unknown time  . Fluticasone-Salmeterol (ADVAIR) 250-50 MCG/DOSE AEPB Inhale 1 puff into the lungs 2 (two) times daily as needed (for respiratory issues.).   04/12/2019 at Unknown time  . lansoprazole (PREVACID) 30 MG capsule Take 30 mg by mouth daily.    04/12/2019 at Unknown time  . lidocaine-prilocaine (EMLA) cream Apply 1 application topically as needed. Apply generously over the Mediport 45 minutes prior to chemotherapy. 30 g 0 Past Month at Unknown time  . losartan (COZAAR) 25 MG tablet Take 25 mg by mouth at bedtime.    04/11/2019 at Unknown time  . metoCLOPramide (REGLAN) 10 MG tablet Take 1 tablet (10 mg total) by mouth every 8 (eight) hours as needed for nausea.  40 tablet 3 Past Month at Unknown time  . mometasone (ELOCON) 0.1 % lotion Apply 1 application topically as needed.    Past Month at Unknown time  . mupirocin ointment (BACTROBAN) 2 % Apply 1 application topically as needed.   0 Past Month at Unknown time  . ondansetron (ZOFRAN) 8 MG tablet Take 1 tablet (8 mg total) by mouth every 8 (eight) hours as needed for nausea or vomiting (start 3 days; after chemo). 40 tablet 1 Past Month at Unknown time  . osimertinib mesylate (TAGRISSO) 80 MG tablet Take 1 tablet (80 mg total) by mouth daily. 30 tablet 5 04/12/2019 at  Unknown time  . polyethylene glycol (MIRALAX / GLYCOLAX) packet Take 17 g by mouth daily as needed for mild constipation.   Past Week at Unknown time  . PREMARIN 1.25 MG tablet Take 1.25 mg by mouth daily.   1 Past Week at Unknown time  . prochlorperazine (COMPAZINE) 10 MG tablet Take 1 tablet (10 mg total) by mouth every 6 (six) hours as needed for nausea or vomiting. 30 tablet 0 Past Month at Unknown time  . pseudoephedrine-guaifenesin (MUCINEX D) 60-600 MG 12 hr tablet Take 1 tablet by mouth every 12 (twelve) hours as needed.    Past Month at Unknown time  . triamcinolone (KENALOG) 0.1 % paste Use as directed 1 application in the mouth or throat 2 (two) times daily. 5 g 12 Past Month at Unknown time  . Vitamin D, Ergocalciferol, (DRISDOL) 50000 units CAPS capsule Take 50,000 Units by mouth every Friday.    Past Week at Unknown time  . meloxicam (MOBIC) 7.5 MG tablet Take 7.5 mg by mouth daily.   Completed Course at Unknown time     Allergies  Allergen Reactions  . Other     Cat gut sutures get infected  . Penicillins Swelling and Rash    Did it involve swelling of the face/tongue/throat, SOB, or low BP? Unknown Did it involve sudden or severe rash/hives, skin peeling, or any reaction on the inside of your mouth or nose? No Did you need to seek medical attention at a hospital or doctor's office? Yes When did it last happen?30 + years  If all above answers are "NO", may proceed with cephalosporin use.    . Sulfa Antibiotics Nausea And Vomiting     Past Medical History:  Diagnosis Date  . Abnormal echocardiogram   . Acid reflux   . Allergic rhinitis   . Anemia    HAD TO HAVE IRON INFUSIONS BACK IN 2015  . Arthritis    bil hands and back  . Asthma   . Bloody discharge from left nipple   . Breast mass    bilateral   . Cancer of right lung ([place]) 2015   lung cancer - chemo, Dr Genevive Bi removed RML Lobectomy.   . Colon polyps   . COPD (chronic obstructive pulmonary disease)  ([place])   . Dyspnea    WITH EXERTION  . Hypertension   . Malignant neoplasm of middle lobe of right lung ([place])   . Mitral valve prolapse   . Mitral valve prolapse   . Non-small cell lung cancer ([place])   . Personal history of chemotherapy    F/U Lung Cancer  . Personal history of radiation therapy    Lung cancer  . Primary cancer of right middle lobe of lung ([place]) 03/11/2016  . Stage IV adenocarcinoma of lung, unspecified laterality ([place])     Review of  systems:      Physical Exam    Heart and lungs: Regular rate and rhythm without rub or gallop lungs are bilaterally clear    HEENT: Normocephalic atraumatic eyes are anicteric    Other:    Pertinant exam for procedure: Soft nontender nondistended bowel sounds positive normoactive    Planned proceedures: Colonoscopy and indicated procedures. I have discussed the risks benefits and complications of procedures to include not limited to bleeding, infection, perforation and the risk of sedation and the patient wishes to proceed.    Lollie Sails, MD Gastroenterology 04/12/2019  1:50 PM

## 2019-04-12 NOTE — Transfer of Care (Signed)
Immediate Anesthesia Transfer of Care Note  Patient: Lauren Page  Procedure(s) Performed: COLONOSCOPY WITH PROPOFOL (N/A )  Patient Location: PACU  Anesthesia Type:General  Level of Consciousness: sedated  Airway & Oxygen Therapy: Patient Spontanous Breathing and Patient connected to nasal cannula oxygen  Post-op Assessment: Report given to RN and Post -op Vital signs reviewed and stable  Post vital signs: Reviewed and stable  Last Vitals:  Vitals Value Taken Time  BP 85/50 04/12/19 1431  Temp    Pulse 63 04/12/19 1432  Resp 14 04/12/19 1432  SpO2 100 % 04/12/19 1432  Vitals shown include unvalidated device data.  Last Pain:  Vitals:   04/12/19 1241  TempSrc: Tympanic  PainSc:          Complications: No apparent anesthesia complications

## 2019-04-12 NOTE — Anesthesia Preprocedure Evaluation (Signed)
Anesthesia Evaluation  Patient identified by MRN, date of birth, ID band Patient awake    Reviewed: Allergy & Precautions, H&P , NPO status , Patient's Chart, lab work & pertinent test results  History of Anesthesia Complications Negative for: history of anesthetic complications  Airway Mallampati: III  TM Distance: <3 FB Neck ROM: limited    Dental  (+) Chipped, Caps   Pulmonary neg shortness of breath, asthma ,           Cardiovascular Exercise Tolerance: Good hypertension, (-) angina(-) Past MI and (-) DOE      Neuro/Psych negative neurological ROS  negative psych ROS   GI/Hepatic Neg liver ROS, GERD  Medicated and Controlled,  Endo/Other  negative endocrine ROS  Renal/GU      Musculoskeletal  (+) Arthritis ,   Abdominal   Peds  Hematology negative hematology ROS (+)   Anesthesia Other Findings Past Medical History: No date: Acid reflux No date: Anemia     Comment:  HAD TO HAVE IRON INFUSIONS BACK IN 2015 No date: Arthritis     Comment:  bil hands and back No date: Asthma 2015: Cancer of right lung ([facility])     Comment:  lung cancer - chemo, Dr Genevive Bi removed RML Lobectomy.  No date: Dyspnea     Comment:  WITH EXERTION No date: Hypertension No date: Mitral valve prolapse  Past Surgical History: No date: ABDOMINAL HYSTERECTOMY 1994 (-), 2004 (-): BREAST BIOPSY; Left 2008 : COLONOSCOPY     Comment:  Dr. Donnella Sham 2007: KNEE SURGERY No date: LUNG LOBECTOMY; Right     Comment:  middle lobe 1983: SALIVARY GLAND SURGERY; Left     Comment:  with lymph node removal also 2008: UPPER GI ENDOSCOPY 01/24/2017: VIDEO BRONCHOSCOPY WITH ENDOBRONCHIAL ULTRASOUND; Right     Comment:  Procedure: VIDEO BRONCHOSCOPY WITH ENDOBRONCHIAL               ULTRASOUND;  Surgeon: [name], MD;                Location: [facility] ORS;  Service: Pulmonary;  Laterality:               Right;  BMI    Body Mass Index:  22.49  kg/m      Reproductive/Obstetrics negative OB ROS                             Anesthesia Physical  Anesthesia Plan  ASA: III  Anesthesia Plan: General   Post-op Pain Management:    Induction: Intravenous  PONV Risk Score and Plan: 3 and Propofol infusion, Treatment may vary due to age or medical condition and TIVA  Airway Management Planned: Nasal Cannula  Additional Equipment:   Intra-op Plan:   Post-operative Plan: Extubation in OR  Informed Consent: I have reviewed the patients History and Physical, chart, labs and discussed the procedure including the risks, benefits and alternatives for the proposed anesthesia with the patient or authorized representative who has indicated his/her understanding and acceptance.     Dental Advisory Given  Plan Discussed with: Anesthesiologist, CRNA and Surgeon  Anesthesia Plan Comments: (Patient consented for risks of anesthesia including but not limited to:  - adverse reactions to medications - risk of intubation if required - damage to teeth, lips or other oral mucosa - sore throat or hoarseness - Damage to heart, brain, lungs or loss of life  Patient voiced understanding.)  Anesthesia Quick Evaluation

## 2019-04-12 NOTE — Op Note (Signed)
[facility] Gastroenterology Patient Name: Lauren Page Procedure Date: 04/12/2019 1:30 PM MRN: [id number] Account #: [id number] Date of Birth: 05-22-1951 Admit Type: Outpatient Age: 68 Room: [facility] ENDO ROOM 3 Gender: Female Note Status: Finalized Procedure:            Colonoscopy Indications:          Personal history of colonic polyps Providers:            Lollie Sails, MD Medicines:            Monitored Anesthesia Care Complications:        No immediate complications. Procedure:            Pre-Anesthesia Assessment:                       - ASA Grade Assessment: III - A patient with severe                        systemic disease.                       After obtaining informed consent, the colonoscope was                        passed under direct vision. Throughout the procedure,                        the patient's blood pressure, pulse, and oxygen                        saturations were monitored continuously. The                        Colonoscope was introduced through the anus and                        advanced to the the cecum, identified by appendiceal                        orifice and ileocecal valve. The colonoscopy was                        performed with moderate difficulty due to a tortuous                        colon. The patient tolerated the procedure well. The                        quality of the bowel preparation was good. Findings:      A few small-mouthed diverticula were found in the sigmoid colon,       descending colon, transverse colon and ascending colon.      The retroflexed view of the distal rectum and anal verge was normal and       showed no anal or rectal abnormalities.      The exam was otherwise without abnormality. Impression:           - Diverticulosis in the sigmoid colon, in the                        descending colon, in the transverse colon and in the  ascending colon.  - The distal rectum and anal verge are normal on                        retroflexion view.                       - The examination was otherwise normal.                       - No specimens collected. Recommendation:       - Discharge patient to home.                       - Repeat colonoscopy in 5 years for surveillance.                       - Soft diet today, then advance as tolerated to advance                        diet as tolerated. Procedure Code(s):    --- Professional ---                       [id number], Colonoscopy, flexible; diagnostic, including                        collection of specimen(s) by brushing or washing, when                        performed (separate procedure) Diagnosis Code(s):    --- Professional ---                       Z86.010, Personal history of colonic polyps                       K57.30, Diverticulosis of large intestine without                        perforation or abscess without bleeding CPT copyright 2019 [employer]. All rights reserved. The codes documented in this report are preliminary and upon coder review may  be revised to meet current compliance requirements. Lollie Sails, MD 04/12/2019 2:30:31 PM This report has been signed electronically. Number of Addenda: 0 Note Initiated On: 04/12/2019 1:30 PM Scope Withdrawal Time: 0 hours 8 minutes 49 seconds  Total Procedure Duration: 0 hours 21 minutes 47 seconds       [facility]

## 2019-04-12 NOTE — Anesthesia Postprocedure Evaluation (Signed)
Anesthesia Post Note  Patient: Lauren Page  Procedure(s) Performed: COLONOSCOPY WITH PROPOFOL (N/A )  Patient location during evaluation: Endoscopy Anesthesia Type: General Level of consciousness: awake and alert Pain management: pain level controlled Vital Signs Assessment: post-procedure vital signs reviewed and stable Respiratory status: spontaneous breathing, nonlabored ventilation, respiratory function stable and patient connected to nasal cannula oxygen Cardiovascular status: blood pressure returned to baseline and stable Postop Assessment: no apparent nausea or vomiting Anesthetic complications: no     Last Vitals:  Vitals:   04/12/19 1451 04/12/19 1459  BP: 122/70 132/79  Pulse: 73 72  Resp: 18 (!) 22  Temp:    SpO2: 100% 100%    Last Pain:  Vitals:   04/12/19 1459  TempSrc:   PainSc: 0-No pain                 Precious Haws [name] Belling

## 2019-04-12 NOTE — Anesthesia Post-op Follow-up Note (Signed)
Anesthesia QCDR form completed.        

## 2019-04-14 NOTE — Progress Notes (Signed)
Pt stated she had a headache Fri night, Sat day and night and again this am.  Took 2 Excederin and was better this afternoon.

## 2019-04-15 ENCOUNTER — Encounter: Payer: Self-pay | Admitting: Gastroenterology

## 2019-04-23 ENCOUNTER — Encounter: Payer: Self-pay | Admitting: *Deleted

## 2019-06-16 NOTE — Progress Notes (Signed)
[place]  Telephone:([phone]) [phone] Fax:(336) [phone]  ID: ICIE KUZNICKI OB: [birth year]  MR#: [id number]  FTD#:[id number]  Patient Care Team: [name], MD as PCP - General (Internal Medicine)  I connected with [name] on 06/21/19 at  2:45 PM EST by video enabled telemedicine visit and verified that I am speaking with the correct person using two identifiers.   I discussed the limitations, risks, security and privacy concerns of performing an evaluation and management service by telemedicine and the availability of in-person appointments. I also discussed with the patient that there may be a patient responsible charge related to this service. The patient expressed understanding and agreed to proceed.   Other persons participating in the visit and their role in the encounter: Patient, MD.  Patient's location: Home. Provider's location: Clinic.  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  INTERVAL HISTORY: Patient agreed to video enabled telemedicine visit for further evaluation and discussion of her imaging results.  She currently feels well.  She has no neurologic complaints.  She denies any recent fevers or illnesses.  She has a good appetite and denies weight loss.  She denies any chest pain or hemoptysis, but admits to persistent and chronic shortness of breath and cough.  She denies any nausea, vomiting, constipation, or diarrhea.  She has no urinary complaints.  Patient offers no further specific complaints today.  REVIEW OF SYSTEMS:   Review of Systems  Constitutional: Negative.  Negative for fever, malaise/fatigue and weight loss.  Respiratory: Positive for cough and shortness of breath. Negative for hemoptysis.   Cardiovascular: Negative.  Negative for chest pain and leg swelling.  Gastrointestinal: Negative for abdominal pain, constipation, diarrhea and nausea.  Genitourinary: Negative.  Negative for dysuria and flank pain.    Musculoskeletal: Negative.  Negative for back pain and joint pain.  Skin: Negative.  Negative for rash.  Neurological: Negative.  Negative for sensory change, focal weakness, weakness and headaches.  Psychiatric/Behavioral: Negative.  The patient is not nervous/anxious.     As per HPI. Otherwise, a complete review of systems is negative.  PAST MEDICAL HISTORY: Past Medical History:  Diagnosis Date  . Abnormal echocardiogram   . Acid reflux   . Allergic rhinitis   . Anemia    HAD TO HAVE IRON INFUSIONS BACK IN 2015  . Arthritis    bil hands and back  . Asthma   . Bloody discharge from left nipple   . Breast mass    bilateral   . Cancer of right lung ([place]) 2015   lung cancer - chemo, Dr Genevive Bi removed RML Lobectomy.   . Colon polyps   . COPD (chronic obstructive pulmonary disease) ([place])   . Dyspnea    WITH EXERTION  . Hypertension   . 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 Mitral valve prolapse   . Mitral valve prolapse   . Non-small cell lung cancer ([place])   . Personal history of chemotherapy    F/U Lung Cancer  . Personal history of radiation therapy    Lung cancer  . Primary cancer of right middle lobe of lung ([place]) 03/11/2016  . Stage IV adenocarcinoma of lung, unspecified laterality ([place])     PAST SURGICAL HISTORY: Past Surgical History:  Procedure Laterality Date  . ABDOMINAL HYSTERECTOMY    . BAND HEMORRHOIDECTOMY    . BREAST BIOPSY Left 03/01/2019   Procedure: LEFT BREAST EXCISIONAL BIOPSY WITH NEEDLE LOCALIZATION;  Surgeon: Herbert Pun, MD;  Location: [facility] ORS;  Service: General;  Laterality: Left;  . BREAST EXCISIONAL BIOPSY Left 1994 (-), 2004 (-)   benign  . BREAST EXCISIONAL BIOPSY Left 03/01/2019   path pending x 2  . COLONOSCOPY  2008    Dr. Donnella Sham  . COLONOSCOPY WITH PROPOFOL N/A 04/12/2019   Procedure: COLONOSCOPY WITH PROPOFOL;  Surgeon: Lollie Sails, MD;  Location: [facility] ENDOSCOPY;  Service: Endoscopy;  Laterality:  N/A;  . KNEE SURGERY  2007  . LUNG LOBECTOMY Right    middle lobe  . PORTACATH PLACEMENT Right 02/09/2017   Procedure: INSERTION PORT-A-CATH;  Surgeon: Nestor Lewandowsky, MD;  Location: [facility] ORS;  Service: General;  Laterality: Right;  . SALIVARY GLAND SURGERY Left 1983   with lymph node removal also  . SALIVARY GLAND SURGERY     salivary gland removal   . UPPER GI ENDOSCOPY  2008  . VIDEO BRONCHOSCOPY WITH ENDOBRONCHIAL ULTRASOUND Right 01/24/2017   Procedure: VIDEO BRONCHOSCOPY WITH ENDOBRONCHIAL ULTRASOUND;  Surgeon: [name], MD;  Location: [facility] ORS;  Service: Pulmonary;  Laterality: Right;    FAMILY HISTORY: Family History  Problem Relation Age of Onset  . Breast cancer Maternal Aunt   . Breast cancer Paternal Aunt   . Breast cancer Maternal Grandmother   . Breast cancer Maternal Aunt   . Breast cancer Paternal Aunt   . Healthy Mother   . Healthy Father     ADVANCED DIRECTIVES (Y/N):  N  HEALTH MAINTENANCE: Social History   Tobacco Use  . Smoking status: Never Smoker  . Smokeless tobacco: Never Used  Substance Use Topics  . Alcohol use: Yes    Alcohol/week: 7.0 standard drinks    Types: 7 Glasses of wine per week  . Drug use: No     Colonoscopy:  PAP:  Bone density:  Lipid panel:  Allergies  Allergen Reactions  . Other Other (See Comments)    Cat gut sutures get infected Cat gut sutures get infected  . Penicillins Swelling and Rash    Did it involve swelling of the face/tongue/throat, SOB, or low BP? Unknown Did it involve sudden or severe rash/hives, skin peeling, or any reaction on the inside of your mouth or nose? No Did you need to seek medical attention at a hospital or doctor's office? Yes When did it last happen?30 + years  If all above answers are "NO", may proceed with cephalosporin use.    . Sulfa Antibiotics Nausea And Vomiting    Current Outpatient Medications  Medication Sig Dispense Refill  . albuterol (PROVENTIL  HFA;VENTOLIN HFA) 108 (90 Base) MCG/ACT inhaler Inhale 1-2 puffs into the lungs every 6 (six) hours as needed for wheezing or shortness of breath.    . B Complex Vitamins (VITAMIN B COMPLEX PO) Take 1 tablet by mouth daily.     . Biotin w/ Vitamins C & E (HAIR/SKIN/NAILS PO) Take 1 tablet by mouth daily.    . Calcium Carb-Cholecalciferol (CALCIUM 600+D) 600-800 MG-UNIT TABS Take 2 tablets by mouth daily.    . Cholecalciferol (D3 MAXIMUM STRENGTH) 5000 units capsule Take 5,000 Units by mouth daily.    . COLLAGEN PO Take 1,000 mg by mouth daily.    . cyclobenzaprine (FLEXERIL) 5 MG tablet Take 5 mg by mouth 3 (three) times daily as needed for muscle spasms.    Marland Kitchen doxycycline (VIBRA-TABS) 100 MG tablet Take 100 mg by mouth 2 (two) times daily.    . fexofenadine (ALLEGRA) 180 MG tablet  Take 180 mg by mouth daily as needed for allergies.     . fluticasone (FLONASE) 50 MCG/ACT nasal spray Place 2 sprays into both nostrils daily as needed for allergies or rhinitis.    . Fluticasone-Salmeterol (ADVAIR) 250-50 MCG/DOSE AEPB Inhale 1 puff into the lungs 2 (two) times daily as needed (for respiratory issues.).    Marland Kitchen GAVILYTE-N WITH FLAVOR PACK 420 g solution     . lansoprazole (PREVACID) 30 MG capsule Take 30 mg by mouth daily.     Marland Kitchen lidocaine-prilocaine (EMLA) cream Apply 1 application topically as needed. Apply generously over the Mediport 45 minutes prior to chemotherapy. 30 g 0  . losartan (COZAAR) 25 MG tablet Take 25 mg by mouth at bedtime.     . meloxicam (MOBIC) 7.5 MG tablet Take 7.5 mg by mouth daily.    . metoCLOPramide (REGLAN) 10 MG tablet Take 1 tablet (10 mg total) by mouth every 8 (eight) hours as needed for nausea. 40 tablet 3  . mometasone (ELOCON) 0.1 % lotion Apply 1 application topically as needed.     . mupirocin ointment (BACTROBAN) 2 % Apply 1 application topically as needed.   0  . ondansetron (ZOFRAN) 8 MG tablet Take 1 tablet (8 mg total) by mouth every 8 (eight) hours as needed for  nausea or vomiting (start 3 days; after chemo). 40 tablet 1  . osimertinib mesylate (TAGRISSO) 80 MG tablet Take 1 tablet (80 mg total) by mouth daily. 30 tablet 5  . polyethylene glycol (MIRALAX / GLYCOLAX) packet Take 17 g by mouth daily as needed for mild constipation.    Marland Kitchen PREMARIN 1.25 MG tablet Take 1.25 mg by mouth daily.   1  . prochlorperazine (COMPAZINE) 10 MG tablet Take 1 tablet (10 mg total) by mouth every 6 (six) hours as needed for nausea or vomiting. 30 tablet 0  . pseudoephedrine-guaifenesin (MUCINEX D) 60-600 MG 12 hr tablet Take 1 tablet by mouth every 12 (twelve) hours as needed.     . triamcinolone (KENALOG) 0.1 % paste Use as directed 1 application in the mouth or throat 2 (two) times daily. 5 g 12  . Vitamin D, Ergocalciferol, (DRISDOL) 50000 units CAPS capsule Take 50,000 Units by mouth every Friday.      No current facility-administered medications for this visit.   Facility-Administered Medications Ordered in Other Visits  Medication Dose Route Frequency Provider Last Rate Last Admin  . heparin lock flush 100 unit/mL  500 Units Intravenous Once Lloyd Huger, MD      . sodium chloride flush (NS) 0.9 % injection 10 mL  10 mL Intravenous PRN Lloyd Huger, MD      . sodium chloride flush (NS) 0.9 % injection 10 mL  10 mL Intravenous PRN Lloyd Huger, MD   10 mL at 01/22/18 1419    OBJECTIVE: There were no vitals filed for this visit.   There is no height or weight on file to calculate BMI.    ECOG FS:0 - Asymptomatic  General: Well-developed, well-nourished, no acute distress. HEENT: Normocephalic. Neuro: Alert, answering all questions appropriately. Cranial nerves grossly intact. Psych: Normal affect.  LAB RESULTS:  Lab Results  Component Value Date   NA 136 06/20/2019   K 4.0 06/20/2019   CL 104 06/20/2019   CO2 25 06/20/2019   GLUCOSE 94 06/20/2019   BUN 16 06/20/2019   CREATININE 0.87 06/20/2019   CALCIUM 8.6 (L) 06/20/2019   PROT  6.4 (L) 06/20/2019  ALBUMIN 3.5 06/20/2019   AST 30 06/20/2019   ALT 25 06/20/2019   ALKPHOS 56 06/20/2019   BILITOT 0.6 06/20/2019   GFRNONAA >60 06/20/2019   GFRAA >60 06/20/2019    Lab Results  Component Value Date   WBC 3.2 (L) 06/20/2019   NEUTROABS 1.9 06/20/2019   HGB 11.2 (L) 06/20/2019   HCT 34.4 (L) 06/20/2019   MCV 92.0 06/20/2019   PLT 164 06/20/2019     STUDIES: CT Chest W Contrast  Result Date: 06/20/2019 CLINICAL DATA:  Restaging lung cancer EXAM: CT CHEST WITH CONTRAST TECHNIQUE: Multidetector CT imaging of the chest was performed during intravenous contrast administration. CONTRAST:  34m OMNIPAQUE IOHEXOL 300 MG/ML  SOLN COMPARISON:  03/19/2019 FINDINGS: Cardiovascular: The heart size is normal. No pericardial effusion. Aortic atherosclerosis. Mediastinum/Nodes: Normal appearance of the thyroid gland. The trachea appears patent and is midline. Normal appearance of the esophagus. No mediastinal, hilar, axillary or supraclavicular adenopathy identified. Lungs/Pleura: There is a moderate size right pleural effusion which appears increased in volume from previous exam. Status post right middle lobectomy. Paramediastinal fibrosis and masslike architectural distortion within the right lung identified compatible with changes secondary to external beam radiation. Similar appearance of pleural thickening and nodularity overlying right lung. Upper Abdomen: No acute abnormality Musculoskeletal: Mild spondylosis within the thoracic spine. No aggressive lytic or sclerotic bone lesions. IMPRESSION: 1. Status post right middle lobectomy. Stable appearance of radiation change within the right lung. 2. Right pleural effusion appears increased in volume from the previous exam. Similar appearance of mild diffuse pleural nodularity overlying the right lung. Pleural metastasis is not excluded. Consider further investigation with diagnostic thoracentesis. Electronically Signed   By: TKerby MoorsM.D.   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  She initially received 4 cycles of adjuvant cisplatin and Alimta completing on January 23, 2014.  Patient was on the [facility] protocol for maintenance Tarceva versus placebo, but discontinued treatment in November 2015 because of significant side effects.  Although blinded, presumption was she was receiving Tarceva.  She recently was noted to have a recurrence confirmed by right paratracheal lymph node biopsy on January 24, 2017.  PET scan on February 03, 2017 did not reveal any distant metastasis.  She underwent treatment with weekly carboplatinum and Taxol along with daily XRT finishing in October 2018.  She initiated maintenance durvalumab on June 29, 2017.  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  Patient started on Tagrisso in April 2019.   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  PLAN:   1. 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  Patient initiated Tagrisso in April 2019.  CT scan results from June 20, 2019 reviewed independently and reported as above with essentially stable appearance, but right pleural effusion appears to be increased with mild diffuse pleural nodularity in the right lung.  Will get a PET scan in the next 1 to 2 weeks to assess whether this is true progression of disease or not.  Continue Tagrisso 80 mg daily.  If PET scan is negative, we could possibly dose reduce to 40 mg daily.  Patient will have video assisted telemedicine visit 1 to 2 days after her PET scan to discuss the results.   2.  Diarrhea: Chronic and unchanged.  Continue Lomotil as  prescribed. 3.  Cough: Chronic and unchanged, monitor. 4.  Fingernail cracking: Patient does not complain of this today.  Possibly secondary to  [place]. 5.  Breast lesion: Benign papilloma.  Patient underwent lumpectomy on March 01, 2019. 6.  Back/right hip pain: No obvious etiology on CT scan.  Monitor. 7.  Right upper lobe nodule: Not mentioned on most recent CT scan.  PET scan as above.  I provided 25 minutes of face-to-face video visit time during this encounter which included chart review. Greater than 50% was spent counseling as documented under my assessment & plan.    Patient expressed understanding and was in agreement with this plan. She also understands that She can call clinic at any time with any questions, concerns, or complaints.    Lloyd Huger, MD   06/21/2019 3:05 PM

## 2019-06-20 ENCOUNTER — Ambulatory Visit
Admission: RE | Admit: 2019-06-20 | Discharge: 2019-06-20 | Disposition: A | Discharge status: Home / Self Care | Payer: Medicare Other | Source: Ambulatory Visit | Attending: Oncology | Admitting: Oncology

## 2019-06-20 ENCOUNTER — Other Ambulatory Visit: Payer: Self-pay

## 2019-06-20 ENCOUNTER — Inpatient Hospital Stay: Payer: Medicare Other | Attending: Oncology

## 2019-06-20 DIAGNOSIS — Z452 Encounter for adjustment and management of vascular access device: Secondary | ICD-10-CM | POA: Diagnosis not present

## 2019-06-20 DIAGNOSIS — C3490 Malignant neoplasm of unspecified part of unspecified bronchus or lung: Secondary | ICD-10-CM | POA: Insufficient documentation

## 2019-06-20 DIAGNOSIS — Z95828 Presence of other vascular implants and grafts: Secondary | ICD-10-CM

## 2019-06-20 LAB — COMPREHENSIVE METABOLIC PANEL
ALT: 25 U/L (ref 0–44)
AST: 30 U/L (ref 15–41)
Albumin: 3.5 g/dL (ref 3.5–5.0)
Alkaline Phosphatase: 56 U/L (ref 38–126)
Anion gap: 7 (ref 5–15)
BUN: 16 mg/dL (ref 8–23)
CO2: 25 mmol/L (ref 22–32)
Calcium: 8.6 mg/dL — ABNORMAL LOW (ref 8.9–10.3)
Chloride: 104 mmol/L (ref 98–111)
Creatinine, Ser: 0.87 mg/dL (ref 0.44–1.00)
GFR calc Af Amer: >60 mL/min (ref >60)
GFR calc non Af Amer: >60 mL/min (ref >60)
Glucose, Bld: 94 mg/dL (ref 70–99)
Potassium: 4 mmol/L (ref 3.5–5.1)
Sodium: 136 mmol/L (ref 135–145)
Total Bilirubin: 0.6 mg/dL (ref 0.3–1.2)
Total Protein: 6.4 g/dL — ABNORMAL LOW (ref 6.5–8.1)

## 2019-06-20 LAB — CBC WITH DIFFERENTIAL/PLATELET
Abs Immature Granulocytes: 0 10*3/uL (ref 0.00–0.07)
Basophils Absolute: 0 10*3/uL (ref 0.0–0.1)
Basophils Relative: 1 %
Eosinophils Absolute: 0.1 10*3/uL (ref 0.0–0.5)
Eosinophils Relative: 4 %
HCT: 34.4 % — ABNORMAL LOW (ref 36.0–46.0)
Hemoglobin: 11.2 g/dL — ABNORMAL LOW (ref 12.0–15.0)
Immature Granulocytes: 0 %
Lymphocytes Relative: 25 %
Lymphs Abs: 0.8 10*3/uL (ref 0.7–4.0)
MCH: 29.9 pg (ref 26.0–34.0)
MCHC: 32.6 g/dL (ref 30.0–36.0)
MCV: 92 fL (ref 80.0–100.0)
Monocytes Absolute: 0.3 10*3/uL (ref 0.1–1.0)
Monocytes Relative: 10 %
Neutro Abs: 1.9 10*3/uL (ref 1.7–7.7)
Neutrophils Relative %: 60 %
Platelets: 164 10*3/uL (ref 150–400)
RBC: 3.74 MIL/uL — ABNORMAL LOW (ref 3.87–5.11)
RDW: 14.2 % (ref 11.5–15.5)
WBC: 3.2 10*3/uL — ABNORMAL LOW (ref 4.0–10.5)
nRBC: 0 % (ref 0.0–0.2)

## 2019-06-20 MED ORDER — SODIUM CHLORIDE 0.9% FLUSH
10.0000 mL | Freq: Once | INTRAVENOUS | Status: AC
  Administered 2019-06-20: 10 mL via INTRAVENOUS
  Filled 2019-06-20: qty 10

## 2019-06-20 MED ORDER — IOHEXOL 300 MG/ML  SOLN
75.0000 mL | Freq: Once | INTRAMUSCULAR | Status: AC | PRN
  Administered 2019-06-20: 11:00:00 75 mL via INTRAVENOUS

## 2019-06-20 MED ORDER — HEPARIN SOD (PORK) LOCK FLUSH 100 UNIT/ML IV SOLN
500.0000 [IU] | Freq: Once | INTRAVENOUS | Status: AC
  Administered 2019-06-20: 500 [IU] via INTRAVENOUS
  Filled 2019-06-20: qty 5

## 2019-06-20 NOTE — Progress Notes (Signed)
Patient prescreened for appointment. Patient has no concerns or questions.  

## 2019-06-21 ENCOUNTER — Inpatient Hospital Stay (HOSPITAL_BASED_OUTPATIENT_CLINIC_OR_DEPARTMENT_OTHER): Payer: Medicare Other | Admitting: Oncology

## 2019-06-21 DIAGNOSIS — C3490 Malignant neoplasm of unspecified part of unspecified bronchus or lung: Secondary | ICD-10-CM | POA: Diagnosis not present

## 2019-06-25 ENCOUNTER — Telehealth: Payer: Self-pay | Admitting: Pharmacy Technician

## 2019-06-25 NOTE — Telephone Encounter (Signed)
Oral Oncology Patient Advocate Encounter  Received email notification from the patient that [employer] Program has approved the patients application for renewal and will receive Tagrisso at $0 out of pocket until 07/03/2020.    I called and spoke with patient.  She knows we will have to re-apply.   Patient knows to call the office with questions or concerns.   Oral Oncology Clinic will continue to follow.  [facility] Patient [place] Phone [phone] Fax [phone] 06/25/2019 8:32 AM

## 2019-06-27 ENCOUNTER — Other Ambulatory Visit: Payer: Medicare Other

## 2019-07-05 NOTE — Progress Notes (Signed)
[place]  [phone] [phone]  ID: Lauren Page OB: [birth year]  MR#: [id number]  EYC#:[id number]  Patient Care Team: [name], MD as PCP - General (Internal Medicine)  I connected with [name] on 07/12/19 at  2:45 PM EST by video enabled telemedicine visit and verified that I am speaking with the correct person using two identifiers.   I discussed the limitations, risks, security and privacy concerns of performing an evaluation and management service by telemedicine and the availability of in-person appointments. I also discussed with the patient that there may be a patient responsible charge related to this service. The patient expressed understanding and agreed to proceed.   Other persons participating in the visit and their role in the encounter: Patient, MD.  Patient's location: Home. Provider's location: Clinic.  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  INTERVAL HISTORY: Patient agreed to video assisted telemedicine visit for further evaluation and discussion of her PET scan results.  She continues to feel well and remains asymptomatic, although she admits to increased shortness of breath particularly with lying down or bending over. She has no neurologic complaints.  She denies any recent fevers or illnesses.  She has a good appetite and denies weight loss.  She denies any chest pain, cough, or hemoptysis.  She denies any nausea, vomiting, constipation, or diarrhea.  She has no urinary complaints.  Patient offers no further specific complaints today.  REVIEW OF SYSTEMS:   Review of Systems  Constitutional: Negative.  Negative for fever, malaise/fatigue and weight loss.  Respiratory: Positive for shortness of breath. Negative for cough and hemoptysis.   Cardiovascular: Negative.  Negative for chest pain and leg swelling.  Gastrointestinal: Negative for abdominal pain, constipation, diarrhea and  nausea.  Genitourinary: Negative.  Negative for dysuria and flank pain.  Musculoskeletal: Negative.  Negative for back pain and joint pain.  Skin: Negative.  Negative for rash.  Neurological: Negative.  Negative for sensory change, focal weakness, weakness and headaches.  Psychiatric/Behavioral: Negative.  The patient is not nervous/anxious.     As per HPI. Otherwise, a complete review of systems is negative.  PAST MEDICAL HISTORY: Past Medical History:  Diagnosis Date  . Abnormal echocardiogram   . Acid reflux   . Allergic rhinitis   . Anemia    HAD TO HAVE IRON INFUSIONS BACK IN 2015  . Arthritis    bil hands and back  . Asthma   . Bloody discharge from left nipple   . Breast mass    bilateral   . Cancer of right lung ([place]) 2015   lung cancer - chemo, Dr Genevive Bi removed RML Lobectomy.   . Colon polyps   . COPD (chronic obstructive pulmonary disease) ([place])   . Dyspnea    WITH EXERTION  . Hypertension   . 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 Mitral valve prolapse   . Mitral valve prolapse   . Non-small cell lung cancer ([place])   . Personal history of chemotherapy    F/U Lung Cancer  . Personal history of radiation therapy    Lung cancer  . Primary cancer of right middle lobe of lung ([place]) 03/11/2016  . Stage IV adenocarcinoma of lung, unspecified laterality ([place])     PAST SURGICAL HISTORY: Past Surgical History:  Procedure Laterality Date  . ABDOMINAL HYSTERECTOMY    . BAND HEMORRHOIDECTOMY    . BREAST BIOPSY Left 03/01/2019   Procedure:  LEFT BREAST EXCISIONAL BIOPSY WITH NEEDLE LOCALIZATION;  Surgeon: Herbert Pun, MD;  Location: [facility] ORS;  Service: General;  Laterality: Left;  . BREAST EXCISIONAL BIOPSY Left 1994 (-), 2004 (-)   benign  . BREAST EXCISIONAL BIOPSY Left 03/01/2019   path pending x 2  . COLONOSCOPY  2008    Dr. Donnella Sham  . COLONOSCOPY WITH PROPOFOL N/A 04/12/2019   Procedure: COLONOSCOPY WITH PROPOFOL;  Surgeon: Lollie Sails, MD;  Location: [facility] ENDOSCOPY;  Service: Endoscopy;  Laterality: N/A;  . KNEE SURGERY  2007  . LUNG LOBECTOMY Right    middle lobe  . PORTACATH PLACEMENT Right 02/09/2017   Procedure: INSERTION PORT-A-CATH;  Surgeon: Nestor Lewandowsky, MD;  Location: [facility] ORS;  Service: General;  Laterality: Right;  . SALIVARY GLAND SURGERY Left 1983   with lymph node removal also  . SALIVARY GLAND SURGERY     salivary gland removal   . UPPER GI ENDOSCOPY  2008  . VIDEO BRONCHOSCOPY WITH ENDOBRONCHIAL ULTRASOUND Right 01/24/2017   Procedure: VIDEO BRONCHOSCOPY WITH ENDOBRONCHIAL ULTRASOUND;  Surgeon: [name], MD;  Location: [facility] ORS;  Service: Pulmonary;  Laterality: Right;    FAMILY HISTORY: Family History  Problem Relation Age of Onset  . Breast cancer Maternal Aunt   . Breast cancer Paternal Aunt   . Breast cancer Maternal Grandmother   . Breast cancer Maternal Aunt   . Breast cancer Paternal Aunt   . Healthy Mother   . Healthy Father     ADVANCED DIRECTIVES (Y/N):  N  HEALTH MAINTENANCE: Social History   Tobacco Use  . Smoking status: Never Smoker  . Smokeless tobacco: Never Used  Substance Use Topics  . Alcohol use: Yes    Alcohol/week: 7.0 standard drinks    Types: 7 Glasses of wine per week  . Drug use: No     Colonoscopy:  PAP:  Bone density:  Lipid panel:  Allergies  Allergen Reactions  . Other Other (See Comments)    Cat gut sutures get infected Cat gut sutures get infected  . Penicillins Swelling and Rash    Did it involve swelling of the face/tongue/throat, SOB, or low BP? Unknown Did it involve sudden or severe rash/hives, skin peeling, or any reaction on the inside of your mouth or nose? No Did you need to seek medical attention at a hospital or doctor's office? Yes When did it last happen?30 + years  If all above answers are "NO", may proceed with cephalosporin use.    . Sulfa Antibiotics Nausea And Vomiting    Current Outpatient  Medications  Medication Sig Dispense Refill  . diclofenac Sodium (VOLTAREN) 1 % GEL Apply topically.    Marland Kitchen albuterol (PROVENTIL HFA;VENTOLIN HFA) 108 (90 Base) MCG/ACT inhaler Inhale 1-2 puffs into the lungs every 6 (six) hours as needed for wheezing or shortness of breath.    Marland Kitchen ascorbic acid (VITAMIN C) 100 MG tablet Take by mouth.    . B Complex Vitamins (VITAMIN B COMPLEX PO) Take 1 tablet by mouth daily.     . Biotin w/ Vitamins C & E (HAIR/SKIN/NAILS PO) Take 1 tablet by mouth daily.    . Calcium Carb-Cholecalciferol (CALCIUM 600+D) 600-800 MG-UNIT TABS Take 2 tablets by mouth daily.    . Cholecalciferol (D3 MAXIMUM STRENGTH) 5000 units capsule Take 5,000 Units by mouth daily.    . COLLAGEN PO Take 1,000 mg by mouth daily.    . cyclobenzaprine (FLEXERIL) 5 MG tablet Take 5 mg by mouth 3 (three)  times daily as needed for muscle spasms.    Marland Kitchen doxycycline (VIBRA-TABS) 100 MG tablet Take 100 mg by mouth 2 (two) times daily.    . fexofenadine (ALLEGRA) 180 MG tablet Take 180 mg by mouth daily as needed for allergies.     . fluticasone (FLONASE) 50 MCG/ACT nasal spray Place 2 sprays into both nostrils daily as needed for allergies or rhinitis.    . Fluticasone-Salmeterol (ADVAIR) 250-50 MCG/DOSE AEPB Inhale 1 puff into the lungs 2 (two) times daily as needed (for respiratory issues.).    Marland Kitchen GAVILYTE-N WITH FLAVOR PACK 420 g solution     . lansoprazole (PREVACID) 30 MG capsule Take 30 mg by mouth daily.     Marland Kitchen lidocaine-prilocaine (EMLA) cream Apply 1 application topically as needed. Apply generously over the Mediport 45 minutes prior to chemotherapy. 30 g 0  . losartan (COZAAR) 25 MG tablet Take 25 mg by mouth at bedtime.     . meclizine (ANTIVERT) 25 MG tablet     . meloxicam (MOBIC) 7.5 MG tablet Take 7.5 mg by mouth daily.    . metoCLOPramide (REGLAN) 10 MG tablet Take 1 tablet (10 mg total) by mouth every 8 (eight) hours as needed for nausea. 40 tablet 3  . mometasone (ELOCON) 0.1 % lotion Apply  1 application topically as needed.     . mupirocin ointment (BACTROBAN) 2 % Apply 1 application topically as needed.   0  . ondansetron (ZOFRAN) 8 MG tablet Take 1 tablet (8 mg total) by mouth every 8 (eight) hours as needed for nausea or vomiting (start 3 days; after chemo). 40 tablet 1  . osimertinib mesylate (TAGRISSO) 80 MG tablet Take 1 tablet (80 mg total) by mouth daily. 30 tablet 5  . polyethylene glycol (MIRALAX / GLYCOLAX) packet Take 17 g by mouth daily as needed for mild constipation.    Marland Kitchen PREMARIN 1.25 MG tablet Take 1.25 mg by mouth daily.   1  . prochlorperazine (COMPAZINE) 10 MG tablet Take 1 tablet (10 mg total) by mouth every 6 (six) hours as needed for nausea or vomiting. 30 tablet 0  . pseudoephedrine-guaifenesin (MUCINEX D) 60-600 MG 12 hr tablet Take 1 tablet by mouth every 12 (twelve) hours as needed.     . triamcinolone (KENALOG) 0.1 % paste Use as directed 1 application in the mouth or throat 2 (two) times daily. 5 g 12  . Vitamin D, Ergocalciferol, (DRISDOL) 50000 units CAPS capsule Take 50,000 Units by mouth every Friday.      No current facility-administered medications for this visit.   Facility-Administered Medications Ordered in Other Visits  Medication Dose Route Frequency Provider Last Rate Last Admin  . heparin lock flush 100 unit/mL  500 Units Intravenous Once Lloyd Huger, MD      . sodium chloride flush (NS) 0.9 % injection 10 mL  10 mL Intravenous PRN Lloyd Huger, MD      . sodium chloride flush (NS) 0.9 % injection 10 mL  10 mL Intravenous PRN Lloyd Huger, MD   10 mL at 01/22/18 1419    OBJECTIVE: There were no vitals filed for this visit.   There is no height or weight on file to calculate BMI.    ECOG FS:0 - Asymptomatic  General: Well-developed, well-nourished, no acute distress. HEENT: Normocephalic. Neuro: Alert, answering all questions appropriately. Cranial nerves grossly intact. Psych: Normal affect.  LAB  RESULTS:  Lab Results  Component Value Date   NA 136 06/20/2019  K 4.0 06/20/2019   CL 104 06/20/2019   CO2 25 06/20/2019   GLUCOSE 94 06/20/2019   BUN 16 06/20/2019   CREATININE 0.87 06/20/2019   CALCIUM 8.6 (L) 06/20/2019   PROT 6.4 (L) 06/20/2019   ALBUMIN 3.5 06/20/2019   AST 30 06/20/2019   ALT 25 06/20/2019   ALKPHOS 56 06/20/2019   BILITOT 0.6 06/20/2019   GFRNONAA >60 06/20/2019   GFRAA >60 06/20/2019    Lab Results  Component Value Date   WBC 3.2 (L) 06/20/2019   NEUTROABS 1.9 06/20/2019   HGB 11.2 (L) 06/20/2019   HCT 34.4 (L) 06/20/2019   MCV 92.0 06/20/2019   PLT 164 06/20/2019     STUDIES: CT Chest W Contrast  Result Date: 06/20/2019 CLINICAL DATA:  Restaging lung cancer EXAM: CT CHEST WITH CONTRAST TECHNIQUE: Multidetector CT imaging of the chest was performed during intravenous contrast administration. CONTRAST:  81m OMNIPAQUE IOHEXOL 300 MG/ML  SOLN COMPARISON:  03/19/2019 FINDINGS: Cardiovascular: The heart size is normal. No pericardial effusion. Aortic atherosclerosis. Mediastinum/Nodes: Normal appearance of the thyroid gland. The trachea appears patent and is midline. Normal appearance of the esophagus. No mediastinal, hilar, axillary or supraclavicular adenopathy identified. Lungs/Pleura: There is a moderate size right pleural effusion which appears increased in volume from previous exam. Status post right middle lobectomy. Paramediastinal fibrosis and masslike architectural distortion within the right lung identified compatible with changes secondary to external beam radiation. Similar appearance of pleural thickening and nodularity overlying right lung. Upper Abdomen: No acute abnormality Musculoskeletal: Mild spondylosis within the thoracic spine. No aggressive lytic or sclerotic bone lesions. IMPRESSION: 1. Status post right middle lobectomy. Stable appearance of radiation change within the right lung. 2. Right pleural effusion appears increased in  volume from the previous exam. Similar appearance of mild diffuse pleural nodularity overlying the right lung. Pleural metastasis is not excluded. Consider further investigation with diagnostic thoracentesis. Electronically Signed   By: TKerby MoorsM.D.   On: 06/20/2019 12:51   NM PET Image Restag (PS) Skull Base To Thigh  Result Date: 07/09/2019 CLINICAL DATA:  Subsequent treatment strategy for stage IV lung cancer with worsening pleural fluid. 04/17/2018. EXAM: NUCLEAR MEDICINE PET SKULL BASE TO THIGH TECHNIQUE: 7.3 mCi F-18 FDG was injected intravenously. Full-ring PET imaging was performed from the skull base to thigh after the radiotracer. CT data was obtained and used for attenuation correction and anatomic localization. Fasting blood glucose: 81 mg/dl COMPARISON:  04/17/2018 FINDINGS: Mediastinal blood pool activity: SUV max 1.8 Liver activity: SUV max 3.0 NECK: No hypermetabolic lymph nodes in the neck. Incidental CT findings: Lack of left submandibular gland is again noted. CHEST: Mildly increased FDG uptake within the right hilum following radiation therapy. Findings are similar to the prior study. (SUVmax = 3.8) Stable appearance of right pleural fluid collection over the interval. Posterior pleural nodularity with subtle increased FDG uptake though not beyond background, adjacent liver (SUVmax = 2.89) Mildly asymmetric uptake of glandular tissue in left breast as compared to right (SUVmax = 2.2) Incidental CT findings: Right IJ Port-A-Cath terminates in the distal superior vena cava. Scattered atherosclerotic changes throughout the thoracic aorta with ectasia just under 4 cm maximal caliber of the ascending thoracic aorta. Heart size is stable without pericardial nodularity. Signs of right perihilar scarring and fibrosis with pleural and parenchymal scarring at the left lung apex similar to prior exam. Basilar atelectasis on the left. Airways are patent. ABDOMEN/PELVIS: Intense FDG uptake within a  lesion arising from the posterior left  kidney. (SUVmax = 8.6) No signs of focal liver uptake, no signs of focal uptake in the spleen or pancreas. Adrenal glands are normal. No signs of retroperitoneal adenopathy. No signs of hypermetabolic nodal disease in the pelvis. Incidental CT findings: On transmission images area of concern in the left kidney is not well delineated, potentially measuring as large as 1.6 cm (image 146, series 3) Noncontrast appearance of liver is normal. There is a small amount of sludge in the dependent gallbladder. Spleen, pancreas and adrenal glands are unremarkable. No signs of acute bowel process on CT images. The appendix is normal. SKELETON: No focal hypermetabolic activity to suggest skeletal metastasis. Incidental CT findings: No destructive bone process. Spinal degenerative changes most notably in the cervical spine. IMPRESSION: 1. Pleural fluid with dependent nodularity that shows mild increased FDG uptake, remains suspicious for metastatic disease. 2. Lesion arising from posterior left kidney is suspicious for metastatic disease. Renal primary is also considered. Suggest dedicated renal protocol CT for further assessment. 3. Mildly asymmetric activity in the left breast relative to right, consider mammographic and or ultrasound correlation as appropriate. 4. Mildly increased FDG uptake in the area of the right hilum likely related to post radiation change, attention on follow-up. Electronically Signed   By: Zetta Bills M.D.   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  She initially received 4 cycles of adjuvant cisplatin and Alimta completing on January 23, 2014.  Patient was on the [facility] protocol for maintenance Tarceva versus placebo, but discontinued treatment in November 2015 because of significant side effects.  Although blinded, presumption was she was receiving Tarceva.  She  recently was noted to have a recurrence confirmed by right paratracheal lymph node biopsy on January 24, 2017.  PET scan on February 03, 2017 did not reveal any distant metastasis.  She underwent treatment with weekly carboplatinum and Taxol along with daily XRT finishing in October 2018.  She initiated maintenance durvalumab on June 29, 2017.  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  Patient started on Tagrisso in April 2019.   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  PLAN:   1. 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  Patient initiated Tagrisso in April 2019.  PET scan results from July 09, 2019 reviewed independently which may show mild progression of disease, but not significant enough to consider changing treatment plan.  Continue Tagrisso 80 mg daily.  Plan to reimage in 6 months. 2.  Diarrhea: Chronic and unchanged.  Continue Lomotil as prescribed. 3.  Cough: Chronic and unchanged, monitor. 4.  Fingernail cracking: Patient does not complain of this today.  Possibly secondary to [place]. 5.  Breast lesion: Benign papilloma.  Patient underwent lumpectomy on March 01, 2019. 6.  Back/right hip pain: No obvious etiology on CT scan.  Monitor. 7.  Right upper lobe nodule: Resolved. 8.  Left kidney lesion: We will get a dedicated CT of the abdomen and pelvis for further evaluation. 9.  Right pleural effusion: Patient has positional shortness of breath, therefore will pursue thoracentesis. 10.  Disposition: Return to clinic 1 to 2 days after CT scan and thoracentesis to discuss the results.  I provided 25 minutes of face-to-face video visit time during this encounter which included chart review. Greater than 50% was spent counseling as  documented under my assessment & plan.     Patient expressed understanding and was in agreement with this plan. She  also understands that She can call clinic at any time with any questions, concerns, or complaints.    Lloyd Huger, MD   07/12/2019 4:58 PM

## 2019-07-09 ENCOUNTER — Other Ambulatory Visit: Payer: Self-pay

## 2019-07-09 ENCOUNTER — Ambulatory Visit
Admission: RE | Admit: 2019-07-09 | Discharge: 2019-07-09 | Disposition: A | Discharge status: Home / Self Care | Payer: Medicare Other | Source: Ambulatory Visit | Attending: Oncology | Admitting: Oncology

## 2019-07-09 DIAGNOSIS — C3490 Malignant neoplasm of unspecified part of unspecified bronchus or lung: Secondary | ICD-10-CM | POA: Insufficient documentation

## 2019-07-09 DIAGNOSIS — N6489 Other specified disorders of breast: Secondary | ICD-10-CM | POA: Insufficient documentation

## 2019-07-09 LAB — GLUCOSE, CAPILLARY: Glucose-Capillary: 81 mg/dL (ref 70–99)

## 2019-07-09 MED ORDER — FLUDEOXYGLUCOSE F - 18 (FDG) INJECTION
7.3000 | Freq: Once | INTRAVENOUS | Status: AC | PRN
  Administered 2019-07-09: 12:00:00 7.3 via INTRAVENOUS

## 2019-07-11 ENCOUNTER — Encounter: Payer: Self-pay | Admitting: Oncology

## 2019-07-11 NOTE — Progress Notes (Signed)
Patient prescreened for appointment. Patient has no concerns or questions.  

## 2019-07-12 ENCOUNTER — Inpatient Hospital Stay: Payer: Medicare Other | Attending: Oncology | Admitting: Oncology

## 2019-07-12 DIAGNOSIS — C3490 Malignant neoplasm of unspecified part of unspecified bronchus or lung: Secondary | ICD-10-CM | POA: Diagnosis not present

## 2019-07-22 ENCOUNTER — Encounter: Payer: Self-pay | Admitting: Oncology

## 2019-07-22 ENCOUNTER — Other Ambulatory Visit: Payer: Self-pay | Admitting: Pharmacist

## 2019-07-22 DIAGNOSIS — C342 Malignant neoplasm of middle lobe, bronchus or lung: Secondary | ICD-10-CM

## 2019-07-22 MED ORDER — OSIMERTINIB MESYLATE 80 MG PO TABS: 80.0000 mg | ORAL_TABLET | Freq: Every day | ORAL | 5 refills | Status: DC

## 2019-07-23 ENCOUNTER — Other Ambulatory Visit: Payer: Self-pay | Admitting: *Deleted

## 2019-07-23 ENCOUNTER — Other Ambulatory Visit: Payer: Self-pay | Admitting: Emergency Medicine

## 2019-07-24 ENCOUNTER — Ambulatory Visit (INDEPENDENT_AMBULATORY_CARE_PROVIDER_SITE_OTHER): Payer: Medicare Other | Admitting: Internal Medicine

## 2019-07-24 ENCOUNTER — Encounter: Payer: Self-pay | Admitting: Internal Medicine

## 2019-07-24 ENCOUNTER — Ambulatory Visit: Payer: Medicare Other

## 2019-07-24 ENCOUNTER — Other Ambulatory Visit: Payer: Self-pay

## 2019-07-24 ENCOUNTER — Other Ambulatory Visit
Admission: RE | Admit: 2019-07-24 | Discharge: 2019-07-24 | Disposition: A | Discharge status: Home / Self Care | Payer: Medicare Other | Source: Ambulatory Visit | Attending: Oncology | Admitting: Oncology

## 2019-07-24 VITALS — BP 138/74 | HR 71 | Temp 97.7°F | Ht 63.0 in | Wt 137.6 lb

## 2019-07-24 DIAGNOSIS — Z01812 Encounter for preprocedural laboratory examination: Secondary | ICD-10-CM | POA: Diagnosis present

## 2019-07-24 DIAGNOSIS — Z20822 Contact with and (suspected) exposure to covid-19: Secondary | ICD-10-CM | POA: Insufficient documentation

## 2019-07-24 DIAGNOSIS — J449 Chronic obstructive pulmonary disease, unspecified: Secondary | ICD-10-CM

## 2019-07-24 NOTE — Patient Instructions (Addendum)
Continue inhalers as needed Avoid allergens and secondhand smoke  Follow up THORACENTESIS AND CT ABD THIS WEEK

## 2019-07-24 NOTE — Progress Notes (Signed)
[facility] Consultation    Update: Pt seen and evaluated before pt going for procedure, new changes in exam have been noted.     Date: 07/24/2019  MRN# [id number] Lauren Page [birth year]    Lauren Page is a 69 y.o. old female seen in consultation for chief complaint of: SOB/DOE and COPD    PREVIOUS HPI 69 year old woman who is status post right middle lobectomy several years ago. She is now about 2-1/2 years out from a right thoracotomy and right middle lobectomy for a adenocarcinoma the lung with pleural invasion, she then completed 4 cycles of chemotherapy on January 23, 2014   About a year ago she did have a small nodule identified along the superior segment right lower lobe close to the area of the fissure, she was found to have a nodular density in the superior segment of the right lower lobe. A subsequent PET scan was performed which revealed no evidence of uptake in the hypermetabolic range.   A repeat CT scan July 2018 There are some bilateral hilar adenopathy which is increased slightly in size.  The largest lymph node now measures over 1.5 cm in the right hilum. The left hilum has a 9 mm lymph node. These have increased from before.   EBUS shows recurrence Of Adenocarcioma-underwent Chemo/RXT  CT scans subsequently showed RT lung pneumonitis from RXT  3.20.19 scans show lung damage and effusion RT sided  Patient also has increased WOB and SOB and cough CT shows pleural effusion as well US guided thoracentesis performed-+malgnant cells with adenocarcinoma  CC Follow-up shortness of breath Follow-up COPD   HPI  COPD seems to be stable at this time Chronic shortness of breath and dyspnea exertion seems to be a little worse Patient takes Advair every day She uses albuterol as needed  No of COPD exacerbation at this time No evidence of heart failure at this time No evidence or signs of infection at this time No respiratory distress No fevers,  chills, nausea, vomiting, diarrhea No evidence of lower extremity edema No evidence hemoptysis  PET scan results reviewed with the patient there is some increased pleural effusion and uptake in the right lung There is also a kidney mass that is PET avid  Patient is to have thoracentesis and CT abdomen pelvis on Friday  Patient is not in any acute distress at this time  Medication:    Current Outpatient Medications:  .  albuterol (PROVENTIL HFA;VENTOLIN HFA) 108 (90 Base) MCG/ACT inhaler, Inhale 1-2 puffs into the lungs every 6 (six) hours as needed for wheezing or shortness of breath., Disp: , Rfl:  .  ascorbic acid (VITAMIN C) 100 MG tablet, Take by mouth., Disp: , Rfl:  .  B Complex Vitamins (VITAMIN B COMPLEX PO), Take 1 tablet by mouth daily. , Disp: , Rfl:  .  Biotin w/ Vitamins C & E (HAIR/SKIN/NAILS PO), Take 1 tablet by mouth daily., Disp: , Rfl:  .  Calcium Carb-Cholecalciferol (CALCIUM 600+D) 600-800 MG-UNIT TABS, Take 2 tablets by mouth daily., Disp: , Rfl:  .  Cholecalciferol (D3 MAXIMUM STRENGTH) 5000 units capsule, Take 5,000 Units by mouth daily., Disp: , Rfl:  .  COLLAGEN PO, Take 1,000 mg by mouth daily., Disp: , Rfl:  .  cyclobenzaprine (FLEXERIL) 5 MG tablet, Take 5 mg by mouth 3 (three) times daily as needed for muscle spasms., Disp: , Rfl:  .  diclofenac Sodium (VOLTAREN) 1 % GEL, Apply topically., Disp: , Rfl:  .  doxycycline (VIBRA-TABS) 100 MG tablet, Take 100 mg by mouth 2 (two) times daily., Disp: , Rfl:  .  fexofenadine (ALLEGRA) 180 MG tablet, Take 180 mg by mouth daily as needed for allergies. , Disp: , Rfl:  .  fluticasone (FLONASE) 50 MCG/ACT nasal spray, Place 2 sprays into both nostrils daily as needed for allergies or rhinitis., Disp: , Rfl:  .  Fluticasone-Salmeterol (ADVAIR) 250-50 MCG/DOSE AEPB, Inhale 1 puff into the lungs 2 (two) times daily as needed (for respiratory issues.)., Disp: , Rfl:  .  GAVILYTE-N WITH FLAVOR PACK 420 g solution, , Disp: ,  Rfl:  .  lansoprazole (PREVACID) 30 MG capsule, Take 30 mg by mouth daily. , Disp: , Rfl:  .  lidocaine-prilocaine (EMLA) cream, Apply 1 application topically as needed. Apply generously over the Mediport 45 minutes prior to chemotherapy., Disp: 30 g, Rfl: 0 .  losartan (COZAAR) 25 MG tablet, Take 25 mg by mouth at bedtime. , Disp: , Rfl:  .  meclizine (ANTIVERT) 25 MG tablet, , Disp: , Rfl:  .  meloxicam (MOBIC) 7.5 MG tablet, Take 7.5 mg by mouth daily., Disp: , Rfl:  .  metoCLOPramide (REGLAN) 10 MG tablet, Take 1 tablet (10 mg total) by mouth every 8 (eight) hours as needed for nausea., Disp: 40 tablet, Rfl: 3 .  mometasone (ELOCON) 0.1 % lotion, Apply 1 application topically as needed. , Disp: , Rfl:  .  mupirocin ointment (BACTROBAN) 2 %, Apply 1 application topically as needed. , Disp: , Rfl: 0 .  ondansetron (ZOFRAN) 8 MG tablet, Take 1 tablet (8 mg total) by mouth every 8 (eight) hours as needed for nausea or vomiting (start 3 days; after chemo)., Disp: 40 tablet, Rfl: 1 .  osimertinib mesylate (TAGRISSO) 80 MG tablet, Take 1 tablet (80 mg total) by mouth daily., Disp: 30 tablet, Rfl: 5 .  polyethylene glycol (MIRALAX / GLYCOLAX) packet, Take 17 g by mouth daily as needed for mild constipation., Disp: , Rfl:  .  PREMARIN 1.25 MG tablet, Take 1.25 mg by mouth daily. , Disp: , Rfl: 1 .  prochlorperazine (COMPAZINE) 10 MG tablet, Take 1 tablet (10 mg total) by mouth every 6 (six) hours as needed for nausea or vomiting., Disp: 30 tablet, Rfl: 0 .  pseudoephedrine-guaifenesin (MUCINEX D) 60-600 MG 12 hr tablet, Take 1 tablet by mouth every 12 (twelve) hours as needed. , Disp: , Rfl:  .  triamcinolone (KENALOG) 0.1 % paste, Use as directed 1 application in the mouth or throat 2 (two) times daily., Disp: 5 g, Rfl: 12 .  Vitamin D, Ergocalciferol, (DRISDOL) 50000 units CAPS capsule, Take 50,000 Units by mouth every Friday. , Disp: , Rfl:  No current facility-administered medications for this  visit.  Facility-Administered Medications Ordered in Other Visits:  .  heparin lock flush 100 unit/mL, 500 Units, Intravenous, Once, Finnegan, Kathlene November, MD .  sodium chloride flush (NS) 0.9 % injection 10 mL, 10 mL, Intravenous, PRN, Grayland Ormond, Kathlene November, MD .  sodium chloride flush (NS) 0.9 % injection 10 mL, 10 mL, Intravenous, PRN, Lloyd Huger, MD, 10 mL at 01/22/18 1419   Allergies:  Other, Penicillins, and Sulfa antibiotics   Review of Systems:  Gen:  Denies  fever, sweats, chills weight loss  HEENT: Denies blurred vision, double vision, ear pain, eye pain, hearing loss, nose bleeds, sore throat Cardiac:  No dizziness, chest pain or heaviness, chest tightness,edema, No JVD Resp:   No cough, -sputum production, -shortness of breath,-wheezing, -  hemoptysis,  Gi: Denies swallowing difficulty, stomach pain, nausea or vomiting, diarrhea, constipation, bowel incontinence Gu:  Denies bladder incontinence, burning urine Ext:   Denies Joint pain, stiffness or swelling Skin: Denies  skin rash, easy bruising or bleeding or hives Endoc:  Denies polyuria, polydipsia , polyphagia or weight change Psych:   Denies depression, insomnia or hallucinations  Other:  All other systems negative   BP 138/74 (BP Location: Left Arm, Patient Position: Sitting, Cuff Size: Large)   Pulse 71   Temp 97.7 F (36.5 C) (Temporal)   Ht 5\' 3"  (1.6 m)   Wt 137 lb 9.6 oz (62.4 kg)   SpO2 96% Comment: on ra  BMI 24.37 kg/m    Physical Examination:   GENERAL:NAD, no fevers, chills, no weakness no fatigue HEAD: Normocephalic, atraumatic.  EYES: PERLA, EOMI No scleral icterus.  NECK: Supple. No thyromegaly.  No JVD.  PULMONARY: CTA B/L no wheezing, rhonchi, crackles CARDIOVASCULAR: S1 and S2. Regular rate and rhythm. No murmurs GASTROINTESTINAL: Soft, nontender, nondistended. Positive bowel sounds.  MUSCULOSKELETAL: No swelling, clubbing, or edema.  NEUROLOGIC: No gross focal neurological deficits.  5/5 strength all extremities SKIN: No ulceration, lesions, rashes, or cyanosis.  PSYCHIATRIC: Insight, judgment intact. 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  The importance of social distancing was discussed today. Hand Washing Techniques and avoid touching face was advised.     MEDICATION ADJUSTMENTS/LABS AND TESTS ORDERED:  Advair daily Albuterol as needed   CURRENT MEDICATIONS REVIEWED AT LENGTH WITH PATIENT TODAY   Patient satisfied with Plan of action and management. All questions answered  Follow up in 6 months      Leba Tibbitts Patricia Pesa, M.D.  [facility]  Medical Director [place] Director [facility] Cardio-Pulmonary Department

## 2019-07-25 ENCOUNTER — Ambulatory Visit: Payer: Medicare Other | Attending: Internal Medicine

## 2019-07-25 DIAGNOSIS — Z23 Encounter for immunization: Secondary | ICD-10-CM | POA: Insufficient documentation

## 2019-07-25 LAB — SARS CORONAVIRUS 2 (TAT 6-24 HRS): SARS Coronavirus 2: NEGATIVE

## 2019-07-25 NOTE — Progress Notes (Signed)
   Covid-19 Vaccination Clinic  Name:  Lauren Page    MRN: [id number] DOB: 1950-09-09  07/25/2019  [name] was observed post Covid-19 immunization for 15 minutes without incidence. She was provided with Vaccine Information Sheet and instruction to access the V-Safe system.   [name] was instructed to call 911 with any severe reactions post vaccine: Marland Kitchen Difficulty breathing  . Swelling of your face and throat  . A fast heartbeat  . A bad rash all over your body  . Dizziness and weakness    Immunizations Administered    Name Date Dose VIS Date Route   Pfizer COVID-19 Vaccine 07/25/2019  3:47 PM 0.3 mL 06/14/2019 Intramuscular   Manufacturer: [place]   Lot: [id number]   [place]: [address]-0

## 2019-07-26 ENCOUNTER — Other Ambulatory Visit: Payer: Self-pay

## 2019-07-26 ENCOUNTER — Ambulatory Visit
Admission: RE | Admit: 2019-07-26 | Discharge: 2019-07-26 | Disposition: A | Discharge status: Home / Self Care | Payer: Medicare Other | Source: Ambulatory Visit | Attending: Oncology | Admitting: Oncology

## 2019-07-26 ENCOUNTER — Other Ambulatory Visit: Payer: Self-pay | Admitting: Oncology

## 2019-07-26 ENCOUNTER — Ambulatory Visit
Admission: RE | Admit: 2019-07-26 | Discharge: 2019-07-26 | Disposition: A | Discharge status: Home / Self Care | Payer: Medicare Other | Source: Ambulatory Visit | Attending: Interventional Radiology | Admitting: Interventional Radiology

## 2019-07-26 DIAGNOSIS — N289 Disorder of kidney and ureter, unspecified: Secondary | ICD-10-CM | POA: Diagnosis not present

## 2019-07-26 DIAGNOSIS — C3490 Malignant neoplasm of unspecified part of unspecified bronchus or lung: Secondary | ICD-10-CM

## 2019-07-26 DIAGNOSIS — Z9889 Other specified postprocedural states: Secondary | ICD-10-CM

## 2019-07-26 MED ORDER — IOHEXOL 300 MG/ML  SOLN
100.0000 mL | Freq: Once | INTRAMUSCULAR | Status: AC | PRN
  Administered 2019-07-26: 17:00:00 100 mL via INTRAVENOUS

## 2019-07-26 NOTE — Progress Notes (Unsigned)
mdt

## 2019-07-26 NOTE — Procedures (Signed)
Interventional Radiology Procedure Note  Procedure: US guided right thoracentesis  Complications: None  Estimated Blood Loss: None  Findings: 500 mL of yellow fluid removed from right pleural space. Post CXR pending.  Venetia Night. Kathlene Cote, M.D Pager:  [phone]

## 2019-07-27 NOTE — Progress Notes (Signed)
[facility]  [phone] [phone]  ID: Lauren Page OB: 1950-07-21  MR#: [id number]  HLK#:[id number]  Patient Care Team: [name], MD as PCP - General (Internal Medicine)  I connected with [name] on 08/01/19 at  1:15 PM EST by video enabled telemedicine visit and verified that I am speaking with the correct person using two identifiers.   I discussed the limitations, risks, security and privacy concerns of performing an evaluation and management service by telemedicine and the availability of in-person appointments. I also discussed with the patient that there may be a patient responsible charge related to this service. The patient expressed understanding and agreed to proceed.   Other persons participating in the visit and their role in the encounter: Patient, MD.  Patients location: Home. Providers location: Clinic.  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  INTERVAL HISTORY: Patient agreed to video assisted telemedicine visit for further evaluation and discussion of her imaging results.  She feels improved since undergoing thoracentesis recently.  She has no neurologic complaints.  She denies any recent fevers or illnesses.  She has a good appetite and denies weight loss.  She denies any chest pain, shortness of breath, cough, or hemoptysis.  She denies any nausea, vomiting, constipation, or diarrhea.  She has no urinary complaints.  Patient offers no specific complaints today.  REVIEW OF SYSTEMS:   Review of Systems  Constitutional: Negative.  Negative for fever, malaise/fatigue and weight loss.  Respiratory: Negative.  Negative for cough, hemoptysis and shortness of breath.   Cardiovascular: Negative.  Negative for chest pain and leg swelling.  Gastrointestinal: Negative for abdominal pain, constipation, diarrhea and nausea.  Genitourinary: Negative.  Negative for dysuria and flank pain.    Musculoskeletal: Negative.  Negative for back pain and joint pain.  Skin: Negative.  Negative for rash.  Neurological: Negative.  Negative for sensory change, focal weakness, weakness and headaches.  Psychiatric/Behavioral: Negative.  The patient is not nervous/anxious.     As per HPI. Otherwise, a complete review of systems is negative.  PAST MEDICAL HISTORY: Past Medical History:  Diagnosis Date   Abnormal echocardiogram    Acid reflux    Allergic rhinitis    Anemia    HAD TO HAVE IRON INFUSIONS BACK IN 2015   Arthritis    bil hands and back   Asthma    Bloody discharge from left nipple    Breast mass    bilateral    Cancer of right lung ([place]) 2015   lung cancer - chemo, Dr Genevive Bi removed RML Lobectomy.    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)    Mitral valve prolapse    Mitral valve prolapse    Non-small cell lung cancer (HCC)    Personal history of chemotherapy    F/U Lung Cancer   Personal history of radiation therapy    Lung cancer   Primary cancer of right middle lobe of lung ([place]) 03/11/2016   Stage IV adenocarcinoma of lung, unspecified laterality ([place])     PAST SURGICAL HISTORY: Past Surgical History:  Procedure Laterality Date   ABDOMINAL HYSTERECTOMY     BAND HEMORRHOIDECTOMY     BREAST BIOPSY Left 03/01/2019   Procedure: LEFT BREAST EXCISIONAL BIOPSY WITH NEEDLE LOCALIZATION;  Surgeon: Herbert Pun, MD;  Location: [facility] ORS;  Service: General;  Laterality: Left;   BREAST EXCISIONAL BIOPSY Left 1994 (-), 2004 (-)   benign   BREAST EXCISIONAL BIOPSY Left 03/01/2019   path pending x 2   COLONOSCOPY  2008    Dr. Donnella Sham   COLONOSCOPY WITH PROPOFOL N/A 04/12/2019   Procedure: COLONOSCOPY WITH PROPOFOL;  Surgeon: Lollie Sails, MD;  Location: [facility] ENDOSCOPY;  Service: Endoscopy;  Laterality:  N/A;   KNEE SURGERY  2007   LUNG LOBECTOMY Right    middle lobe   PORTACATH PLACEMENT Right 02/09/2017   Procedure: INSERTION PORT-A-CATH;  Surgeon: Nestor Lewandowsky, MD;  Location: [facility] ORS;  Service: General;  Laterality: Right;   SALIVARY GLAND SURGERY Left 1983   with lymph node removal also   SALIVARY GLAND SURGERY     salivary gland removal    UPPER GI ENDOSCOPY  2008   VIDEO BRONCHOSCOPY WITH ENDOBRONCHIAL ULTRASOUND Right 01/24/2017   Procedure: VIDEO BRONCHOSCOPY WITH ENDOBRONCHIAL ULTRASOUND;  Surgeon: [name], MD;  Location: [facility] ORS;  Service: Pulmonary;  Laterality: Right;    FAMILY HISTORY: Family History  Problem Relation Age of Onset   Breast cancer Maternal Aunt    Breast cancer Paternal Aunt    Breast cancer Maternal Grandmother    Breast cancer Maternal Aunt    Breast cancer Paternal Aunt    Healthy Mother    Healthy Father     ADVANCED DIRECTIVES (Y/N):  N  HEALTH MAINTENANCE: Social History   Tobacco Use   Smoking status: Never Smoker   Smokeless tobacco: Never Used  Substance Use Topics   Alcohol use: Yes    Alcohol/week: 7.0 standard drinks    Types: 7 Glasses of wine per week   Drug use: No     Colonoscopy:  PAP:  Bone density:  Lipid panel:  Allergies  Allergen Reactions   Other Other (See Comments)    Cat gut sutures get infected Cat gut sutures get infected   Penicillins Swelling and Rash    Did it involve swelling of the face/tongue/throat, SOB, or low BP? Unknown Did it involve sudden or severe rash/hives, skin peeling, or any reaction on the inside of your mouth or nose? No Did you need to seek medical attention at a hospital or doctor's office? Yes When did it last happen?30 + years  If all above answers are NO, may proceed with cephalosporin use.     Sulfa Antibiotics Nausea And Vomiting    Current Outpatient Medications  Medication Sig Dispense Refill   albuterol (PROVENTIL  HFA;VENTOLIN HFA) 108 (90 Base) MCG/ACT inhaler Inhale 1-2 puffs into the lungs every 6 (six) hours as needed for wheezing or shortness of breath.     ascorbic acid (VITAMIN C) 100 MG tablet Take by mouth.     B Complex Vitamins (VITAMIN B COMPLEX PO) Take 1 tablet by mouth daily.      Biotin w/ Vitamins C & E (HAIR/SKIN/NAILS PO) Take 1 tablet by mouth daily.     Calcium Carb-Cholecalciferol (CALCIUM 600+D) 600-800 MG-UNIT TABS Take 2 tablets by mouth daily.     Cholecalciferol (D3 MAXIMUM STRENGTH) 5000 units capsule Take 5,000 Units by mouth daily.     COLLAGEN PO Take 1,000 mg by mouth daily.     cyclobenzaprine (FLEXERIL) 5 MG tablet Take 5 mg by mouth 3 (three) times daily as needed for muscle spasms.     diclofenac Sodium (VOLTAREN) 1 % GEL Apply topically.     doxycycline (VIBRA-TABS)  100 MG tablet Take 100 mg by mouth 2 (two) times daily.     fexofenadine (ALLEGRA) 180 MG tablet Take 180 mg by mouth daily as needed for allergies.      fluticasone (FLONASE) 50 MCG/ACT nasal spray Place 2 sprays into both nostrils daily as needed for allergies or rhinitis.     Fluticasone-Salmeterol (ADVAIR) 250-50 MCG/DOSE AEPB Inhale 1 puff into the lungs 2 (two) times daily as needed (for respiratory issues.).     GAVILYTE-N WITH FLAVOR PACK 420 g solution      lansoprazole (PREVACID) 30 MG capsule Take 30 mg by mouth daily.      lidocaine-prilocaine (EMLA) cream Apply 1 application topically as needed. Apply generously over the Mediport 45 minutes prior to chemotherapy. 30 g 0   losartan (COZAAR) 25 MG tablet Take 25 mg by mouth at bedtime.      meclizine (ANTIVERT) 25 MG tablet      meloxicam (MOBIC) 7.5 MG tablet Take 7.5 mg by mouth daily.     metoCLOPramide (REGLAN) 10 MG tablet Take 1 tablet (10 mg total) by mouth every 8 (eight) hours as needed for nausea. 40 tablet 3   mometasone (ELOCON) 0.1 % lotion Apply 1 application topically as needed.      mupirocin ointment  (BACTROBAN) 2 % Apply 1 application topically as needed.   0   ondansetron (ZOFRAN) 8 MG tablet Take 1 tablet (8 mg total) by mouth every 8 (eight) hours as needed for nausea or vomiting (start 3 days; after chemo). 40 tablet 1   osimertinib mesylate (TAGRISSO) 80 MG tablet Take 1 tablet (80 mg total) by mouth daily. 30 tablet 5   polyethylene glycol (MIRALAX / GLYCOLAX) packet Take 17 g by mouth daily as needed for mild constipation.     PREMARIN 1.25 MG tablet Take 1.25 mg by mouth daily.   1   prochlorperazine (COMPAZINE) 10 MG tablet Take 1 tablet (10 mg total) by mouth every 6 (six) hours as needed for nausea or vomiting. 30 tablet 0   pseudoephedrine-guaifenesin (MUCINEX D) 60-600 MG 12 hr tablet Take 1 tablet by mouth every 12 (twelve) hours as needed.      triamcinolone (KENALOG) 0.1 % paste Use as directed 1 application in the mouth or throat 2 (two) times daily. 5 g 12   Vitamin D, Ergocalciferol, (DRISDOL) 50000 units CAPS capsule Take 50,000 Units by mouth every Friday.      No current facility-administered medications for this visit.   Facility-Administered Medications Ordered in Other Visits  Medication Dose Route Frequency Provider Last Rate Last Admin   heparin lock flush 100 unit/mL  500 Units Intravenous Once Grayland Ormond, Kathlene November, MD       sodium chloride flush (NS) 0.9 % injection 10 mL  10 mL Intravenous PRN Lloyd Huger, MD       sodium chloride flush (NS) 0.9 % injection 10 mL  10 mL Intravenous PRN Lloyd Huger, MD   10 mL at 01/22/18 1419    OBJECTIVE: There were no vitals filed for this visit.   There is no height or weight on file to calculate BMI.    ECOG FS:0 - Asymptomatic  General: Well-developed, well-nourished, no acute distress. HEENT: Normocephalic. Neuro: Alert, answering all questions appropriately. Cranial nerves grossly intact. Psych: Normal affect.  LAB RESULTS:  Lab Results  Component Value Date   NA 136 06/20/2019   K  4.0 06/20/2019   CL 104 06/20/2019   CO2  25 06/20/2019   GLUCOSE 94 06/20/2019   BUN 16 06/20/2019   CREATININE 0.87 06/20/2019   CALCIUM 8.6 (L) 06/20/2019   PROT 6.4 (L) 06/20/2019   ALBUMIN 3.5 06/20/2019   AST 30 06/20/2019   ALT 25 06/20/2019   ALKPHOS 56 06/20/2019   BILITOT 0.6 06/20/2019   GFRNONAA >60 06/20/2019   GFRAA >60 06/20/2019    Lab Results  Component Value Date   WBC 3.2 (L) 06/20/2019   NEUTROABS 1.9 06/20/2019   HGB 11.2 (L) 06/20/2019   HCT 34.4 (L) 06/20/2019   MCV 92.0 06/20/2019   PLT 164 06/20/2019     STUDIES: DG Chest 1 View  Result Date: 07/26/2019 CLINICAL DATA:  History of lung carcinoma and status post right thoracentesis. EXAM: CHEST  1 VIEW COMPARISON:  PET scan on 07/09/2019 FINDINGS: No pneumothorax after right thoracentesis. No significant right pleural fluid remains. Right hilar density and tissue retraction remains related to prior surgery. No edema or airspace consolidation. IMPRESSION: No pneumothorax or residual right pleural fluid after thoracentesis. Right hilar density with tissue retraction remains present related to prior surgery. Electronically Signed   By: Aletta Edouard M.D.   On: 07/26/2019 16:16   NM PET Image Restag (PS) Skull Base To Thigh  Result Date: 07/09/2019 CLINICAL DATA:  Subsequent treatment strategy for stage IV lung cancer with worsening pleural fluid. 04/17/2018. EXAM: NUCLEAR MEDICINE PET SKULL BASE TO THIGH TECHNIQUE: 7.3 mCi F-18 FDG was injected intravenously. Full-ring PET imaging was performed from the skull base to thigh after the radiotracer. CT data was obtained and used for attenuation correction and anatomic localization. Fasting blood glucose: 81 mg/dl COMPARISON:  04/17/2018 FINDINGS: Mediastinal blood pool activity: SUV max 1.8 Liver activity: SUV max 3.0 NECK: No hypermetabolic lymph nodes in the neck. Incidental CT findings: Lack of left submandibular gland is again noted. CHEST: Mildly increased FDG  uptake within the right hilum following radiation therapy. Findings are similar to the prior study. (SUVmax = 3.8) Stable appearance of right pleural fluid collection over the interval. Posterior pleural nodularity with subtle increased FDG uptake though not beyond background, adjacent liver (SUVmax = 2.89) Mildly asymmetric uptake of glandular tissue in left breast as compared to right (SUVmax = 2.2) Incidental CT findings: Right IJ Port-A-Cath terminates in the distal superior vena cava. Scattered atherosclerotic changes throughout the thoracic aorta with ectasia just under 4 cm maximal caliber of the ascending thoracic aorta. Heart size is stable without pericardial nodularity. Signs of right perihilar scarring and fibrosis with pleural and parenchymal scarring at the left lung apex similar to prior exam. Basilar atelectasis on the left. Airways are patent. ABDOMEN/PELVIS: Intense FDG uptake within a lesion arising from the posterior left kidney. (SUVmax = 8.6) No signs of focal liver uptake, no signs of focal uptake in the spleen or pancreas. Adrenal glands are normal. No signs of retroperitoneal adenopathy. No signs of hypermetabolic nodal disease in the pelvis. Incidental CT findings: On transmission images area of concern in the left kidney is not well delineated, potentially measuring as large as 1.6 cm (image 146, series 3) Noncontrast appearance of liver is normal. There is a small amount of sludge in the dependent gallbladder. Spleen, pancreas and adrenal glands are unremarkable. No signs of acute bowel process on CT images. The appendix is normal. SKELETON: No focal hypermetabolic activity to suggest skeletal metastasis. Incidental CT findings: No destructive bone process. Spinal degenerative changes most notably in the cervical spine. IMPRESSION: 1. Pleural fluid with dependent  nodularity that shows mild increased FDG uptake, remains suspicious for metastatic disease. 2. Lesion arising from posterior  left kidney is suspicious for metastatic disease. Renal primary is also considered. Suggest dedicated renal protocol CT for further assessment. 3. Mildly asymmetric activity in the left breast relative to right, consider mammographic and or ultrasound correlation as appropriate. 4. Mildly increased FDG uptake in the area of the right hilum likely related to post radiation change, attention on follow-up. Electronically Signed   By: Zetta Bills M.D.   On: 07/09/2019 15:49   CT ABDOMEN W WO CONTRAST  Result Date: 07/26/2019 CLINICAL DATA:  Recurrent adenocarcinoma of the lung abnormal finding on recent PET scan in the left kidney. EXAM: CT ABDOMEN WITHOUT AND WITH CONTRAST TECHNIQUE: Multidetector CT imaging of the abdomen was performed following the standard protocol before and following the bolus administration of intravenous contrast. CONTRAST:  157m OMNIPAQUE IOHEXOL 300 MG/ML  SOLN COMPARISON:  03/19/2019, PET exam 07/09/2019 FINDINGS: Lower chest: No oval of Accu a shin of pleural fluid in the right chest. Hepatobiliary: No signs of focal suspicious abnormality in the liver. Subtle low attenuation in the right hepatic lobe unchanged, likely small cysts. Pancreas: Pancreas is normal. Spleen: Spleen is normal size without focal lesion. Adrenals/Urinary Tract: Adrenal glands are unremarkable. Lesion in the posterior left kidney with ill-defined margins and hypoenhancement measures 1.7 x 1.5 cm, centered in the interpolar portion of the left kidney. Other small low-density foci are without change. This area measured approximately 9 mm on the study of 03/19/2019. Stomach/Bowel: No acute gastrointestinal process. Pelvic bowel loops are not imaged. Vascular/Lymphatic: Vascular structures in the abdomen are patent with mild atherosclerotic changes. No signs of adenopathy. Other: No abdominal wall hernia or abnormality. Musculoskeletal: No signs of acute bone finding or destructive bone process. Spinal  degenerative changes. IMPRESSION: 1. Lesion in the posterior left kidney with ill-defined margins and hypoenhancement measures 1.7 x 1.5 cm. This area measured approximately 9 mm on the study of 03/19/2019. Based on appearance and hypoenhancement this is most suspicious for metastasis rather than a primary renal cell carcinoma. Biopsy may be helpful. Electronically Signed   By: GZetta BillsM.D.   On: 07/26/2019 16:55   [place]OR[place]ENTESIS ASP PLEURAL SPACE W/IMG GUIDE  Result Date: 07/26/2019 CLINICAL DATA:  History of recurrent lung carcinoma with right pleural effusion. EXAM: ULTRASOUND GUIDED RIGHT THORACENTESIS COMPARISON:  PET scan on 07/09/2019 PROCEDURE: An ultrasound guided thoracentesis was thoroughly discussed with the patient and questions answered. The benefits, risks, alternatives and complications were also discussed. The patient understands and wishes to proceed with the procedure. Written consent was obtained. Ultrasound was performed to localize and mark an adequate pocket of fluid in the right chest. The area was then prepped and draped in the normal sterile fashion. 1% Lidocaine was used for local anesthesia. Under ultrasound guidance a 6 French Safe-T-Centesis catheter was introduced. Thoracentesis was performed. The catheter was removed and a dressing applied. COMPLICATIONS: None FINDINGS: A total of approximately 500 mL of yellowish fluid was removed. A fluid sample was sent for laboratory analysis. IMPRESSION: Successful ultrasound guided right thoracentesis yielding 500 mL of pleural fluid. Electronically Signed   By: GAletta EdouardM.D.   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  She initially received 4 cycles of adjuvant cisplatin and Alimta completing on January 23, 2014.  Patient was on the [facility] protocol for maintenance  Tarceva versus placebo, but discontinued treatment in November 2015  because of significant side effects.  Although blinded, presumption was she was receiving Tarceva.  She recently was noted to have a recurrence confirmed by right paratracheal lymph node biopsy on January 24, 2017.  PET scan on February 03, 2017 did not reveal any distant metastasis.  She underwent treatment with weekly carboplatinum and Taxol along with daily XRT finishing in October 2018.  She initiated maintenance durvalumab on June 29, 2017.  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  Patient started on Tagrisso in April 2019.   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  PLAN:   1. 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  Patient initiated Tagrisso in April 2019.  PET scan results from July 09, 2019 reviewed independently which may show mild progression of disease, but not significant enough to consider changing treatment plan.  Continue Tagrisso 80 mg daily.  Plan to reimage in 6 months. 2.  Diarrhea: Chronic and unchanged.  Continue Lomotil as prescribed. 3.  Cough: Chronic and unchanged, monitor. 4.  Fingernail cracking: Patient does not complain of this today.  Possibly secondary to [place]. 5.  Breast lesion: Benign papilloma.  Patient underwent lumpectomy on March 01, 2019. 6.  Back/right hip pain: No obvious etiology on CT scan.  Monitor. 7.  Right upper lobe nodule: Resolved. 8.  Left kidney lesion: CT scan results from July 26, 2019 reviewed independently and reported as above with what appears to be a metastatic lesion in the left kidney.  After lengthy discussion with the patient, she wishes to undergo biopsy to confirm whether this is truly a metastatic lesion or possibly a second primary.  She will have a video assisted telemedicine visit approximately 1 week after her biopsy to discuss the results.  9.  Right pleural effusion: Resolved.   Patient underwent thoracentesis which removed with 500 mL of fluid.   I provided 20 minutes of face-to-face video visit time during this encounter which included chart review, counseling, and coordination of care as documented above.   Patient expressed understanding and was in agreement with this plan. She also understands that She can call clinic at any time with any questions, concerns, or complaints.    Lloyd Huger, MD   08/01/2019 6:36 AM

## 2019-07-30 ENCOUNTER — Other Ambulatory Visit: Payer: Self-pay

## 2019-07-30 ENCOUNTER — Encounter: Payer: Self-pay | Admitting: Oncology

## 2019-07-30 ENCOUNTER — Inpatient Hospital Stay (HOSPITAL_BASED_OUTPATIENT_CLINIC_OR_DEPARTMENT_OTHER): Payer: Medicare Other | Admitting: Oncology

## 2019-07-30 DIAGNOSIS — C3490 Malignant neoplasm of unspecified part of unspecified bronchus or lung: Secondary | ICD-10-CM

## 2019-07-30 NOTE — Progress Notes (Signed)
Patient prescreened for appointment. Patient has no concerns or questions.  

## 2019-07-31 ENCOUNTER — Other Ambulatory Visit: Payer: Self-pay | Admitting: Emergency Medicine

## 2019-07-31 ENCOUNTER — Telehealth: Payer: Self-pay | Admitting: Emergency Medicine

## 2019-07-31 DIAGNOSIS — C3490 Malignant neoplasm of unspecified part of unspecified bronchus or lung: Secondary | ICD-10-CM

## 2019-07-31 LAB — CYTOLOGY - NON PAP

## 2019-07-31 NOTE — Telephone Encounter (Signed)
Called to let patient know that kidney biopsy is scheduled for 2/11 with a 9:30 arrival time. Pt verbalized understanding and didn't have any further questions or concerns.

## 2019-08-14 ENCOUNTER — Other Ambulatory Visit: Payer: Self-pay | Admitting: Student

## 2019-08-14 ENCOUNTER — Other Ambulatory Visit: Payer: Self-pay | Admitting: Radiology

## 2019-08-15 ENCOUNTER — Other Ambulatory Visit: Payer: Self-pay

## 2019-08-15 ENCOUNTER — Ambulatory Visit: Payer: Medicare Other | Attending: Internal Medicine

## 2019-08-15 ENCOUNTER — Inpatient Hospital Stay: Payer: Medicare Other

## 2019-08-15 ENCOUNTER — Ambulatory Visit
Admission: RE | Admit: 2019-08-15 | Discharge: 2019-08-15 | Disposition: A | Discharge status: Home / Self Care | Payer: Medicare Other | Source: Ambulatory Visit | Attending: Oncology | Admitting: Oncology

## 2019-08-15 DIAGNOSIS — Z902 Acquired absence of lung [part of]: Secondary | ICD-10-CM | POA: Insufficient documentation

## 2019-08-15 DIAGNOSIS — Z23 Encounter for immunization: Secondary | ICD-10-CM | POA: Insufficient documentation

## 2019-08-15 DIAGNOSIS — J449 Chronic obstructive pulmonary disease, unspecified: Secondary | ICD-10-CM | POA: Insufficient documentation

## 2019-08-15 DIAGNOSIS — Z9221 Personal history of antineoplastic chemotherapy: Secondary | ICD-10-CM | POA: Insufficient documentation

## 2019-08-15 DIAGNOSIS — Z923 Personal history of irradiation: Secondary | ICD-10-CM | POA: Insufficient documentation

## 2019-08-15 DIAGNOSIS — N2889 Other specified disorders of kidney and ureter: Secondary | ICD-10-CM | POA: Diagnosis present

## 2019-08-15 DIAGNOSIS — C7902 Secondary malignant neoplasm of left kidney and renal pelvis: Secondary | ICD-10-CM | POA: Insufficient documentation

## 2019-08-15 DIAGNOSIS — Z882 Allergy status to sulfonamides status: Secondary | ICD-10-CM | POA: Diagnosis not present

## 2019-08-15 DIAGNOSIS — C3490 Malignant neoplasm of unspecified part of unspecified bronchus or lung: Secondary | ICD-10-CM

## 2019-08-15 DIAGNOSIS — Z85118 Personal history of other malignant neoplasm of bronchus and lung: Secondary | ICD-10-CM | POA: Insufficient documentation

## 2019-08-15 DIAGNOSIS — Z79899 Other long term (current) drug therapy: Secondary | ICD-10-CM | POA: Insufficient documentation

## 2019-08-15 DIAGNOSIS — I10 Essential (primary) hypertension: Secondary | ICD-10-CM | POA: Diagnosis not present

## 2019-08-15 DIAGNOSIS — Z880 Allergy status to penicillin: Secondary | ICD-10-CM | POA: Insufficient documentation

## 2019-08-15 LAB — CBC
HCT: 35.6 % — ABNORMAL LOW (ref 36.0–46.0)
Hemoglobin: 12 g/dL (ref 12.0–15.0)
MCH: 30.2 pg (ref 26.0–34.0)
MCHC: 33.7 g/dL (ref 30.0–36.0)
MCV: 89.4 fL (ref 80.0–100.0)
Platelets: 186 10*3/uL (ref 150–400)
RBC: 3.98 MIL/uL (ref 3.87–5.11)
RDW: 14.9 % (ref 11.5–15.5)
WBC: 3.1 10*3/uL — ABNORMAL LOW (ref 4.0–10.5)
nRBC: 0 % (ref 0.0–0.2)

## 2019-08-15 LAB — PROTIME-INR
INR: 0.9 (ref 0.8–1.2)
Prothrombin Time: 12.3 seconds (ref 11.4–15.2)

## 2019-08-15 MED ORDER — MIDAZOLAM HCL 2 MG/2ML IJ SOLN
INTRAMUSCULAR | Status: AC
  Filled 2019-08-15: qty 4

## 2019-08-15 MED ORDER — SODIUM CHLORIDE 0.9 % IV SOLN: INTRAVENOUS | Status: DC

## 2019-08-15 MED ORDER — HEPARIN SOD (PORK) LOCK FLUSH 100 UNIT/ML IV SOLN
INTRAVENOUS | Status: AC
  Administered 2019-08-15: 13:00:00 500 [IU]
  Filled 2019-08-15: qty 5

## 2019-08-15 MED ORDER — FENTANYL CITRATE (PF) 100 MCG/2ML IJ SOLN
INTRAMUSCULAR | Status: AC
  Filled 2019-08-15: qty 4

## 2019-08-15 MED ORDER — MIDAZOLAM HCL 2 MG/2ML IJ SOLN
INTRAMUSCULAR | Status: AC | PRN
  Administered 2019-08-15 (×2): 1 mg via INTRAVENOUS

## 2019-08-15 MED ORDER — FENTANYL CITRATE (PF) 100 MCG/2ML IJ SOLN
INTRAMUSCULAR | Status: AC | PRN
  Administered 2019-08-15 (×2): 50 ug via INTRAVENOUS

## 2019-08-15 NOTE — Progress Notes (Signed)
   Covid-19 Vaccination Clinic  Name:  KAITLEN REDFORD    MRN: [id number] DOB: 07-21-1950  08/15/2019  Ms. Zunker was observed post Covid-19 immunization for 15 minutes without incidence. She was provided with Vaccine Information Sheet and instruction to access the V-Safe system.   Ms. Orren was instructed to call 911 with any severe reactions post vaccine: Marland Kitchen Difficulty breathing  . Swelling of your face and throat  . A fast heartbeat  . A bad rash all over your body  . Dizziness and weakness    Immunizations Administered    Name Date Dose VIS Date Route   Pfizer COVID-19 Vaccine 08/15/2019  4:04 PM 0.3 mL 06/14/2019 Intramuscular   Manufacturer: [place]   Lot: [id number]   [place]: [address]-4

## 2019-08-15 NOTE — Procedures (Signed)
Interventional Radiology Procedure Note  Procedure: US Guided Biopsy of left renal mass  Complications: None  Estimated Blood Loss: < 10 mL  Findings: 18 G core biopsy of left renal mass performed under US guidance.  Three core samples obtained and sent to Pathology.  Venetia Night. Kathlene Cote, M.D Pager:  [phone]

## 2019-08-15 NOTE — H&P (Signed)
Chief Complaint: Patient was seen in consultation today for left renal mass biopsy at the request of Finnegan,Timothy J  Referring Physician(s): Finnegan,Timothy J  Patient Status: [facility] - Out-pt  History of Present Illness: Lauren MATZEK is a 69 y.o. female known to our service from prior RUL lung mass biopsy in 2015 revealing adenocarcinoma and two prior right thoracentesis procedures in 2019 and recently on 07/26/19. During treatment for recurrent lung adenocarcinoma, noted to have a new and enlarging left posterior renal mass measuring approximately 1.5 x 1.7 cm by CT on 07/26/19 and only few mm in Sept, 2020. Favored to represent metastatic lesion, but could represent a renal carcinoma. Now presents for biopsy to establish tissue diagnosis. No flank pain or urinary symptoms.  Past Medical History:  Diagnosis Date  . Abnormal echocardiogram   . Acid reflux   . Allergic rhinitis   . Anemia    HAD TO HAVE IRON INFUSIONS BACK IN 2015  . Arthritis    bil hands and back  . Asthma   . Bloody discharge from left nipple   . Breast mass    bilateral   . Cancer of right lung ([place]) 2015   lung cancer - chemo, Dr Genevive Bi removed RML Lobectomy.   . Colon polyps   . COPD (chronic obstructive pulmonary disease) ([place])   . Dyspnea    WITH EXERTION  . Hypertension   . Malignant neoplasm of middle lobe of right lung ([place])   . Mitral valve prolapse   . Mitral valve prolapse   . Non-small cell lung cancer ([place])   . Personal history of chemotherapy    F/U Lung Cancer  . Personal history of radiation therapy    Lung cancer  . Primary cancer of right middle lobe of lung ([place]) 03/11/2016  . Stage IV adenocarcinoma of lung, unspecified laterality [facility])     Past Surgical History:  Procedure Laterality Date  . ABDOMINAL HYSTERECTOMY    . BAND HEMORRHOIDECTOMY    . BREAST BIOPSY Left 03/01/2019   Procedure: LEFT BREAST EXCISIONAL BIOPSY WITH NEEDLE LOCALIZATION;  Surgeon: Herbert Pun,  MD;  Location: [facility] ORS;  Service: General;  Laterality: Left;  . BREAST EXCISIONAL BIOPSY Left 1994 (-), 2004 (-)   benign  . BREAST EXCISIONAL BIOPSY Left 03/01/2019   path pending x 2  . COLONOSCOPY  2008    Dr. Donnella Sham  . COLONOSCOPY WITH PROPOFOL N/A 04/12/2019   Procedure: COLONOSCOPY WITH PROPOFOL;  Surgeon: Lollie Sails, MD;  Location: [facility] ENDOSCOPY;  Service: Endoscopy;  Laterality: N/A;  . KNEE SURGERY  2007  . LUNG LOBECTOMY Right    middle lobe  . PORTACATH PLACEMENT Right 02/09/2017   Procedure: INSERTION PORT-A-CATH;  Surgeon: Nestor Lewandowsky, MD;  Location: [facility] ORS;  Service: General;  Laterality: Right;  . SALIVARY GLAND SURGERY Left 1983   with lymph node removal also  . SALIVARY GLAND SURGERY     salivary gland removal   . UPPER GI ENDOSCOPY  2008  . VIDEO BRONCHOSCOPY WITH ENDOBRONCHIAL ULTRASOUND Right 01/24/2017   Procedure: VIDEO BRONCHOSCOPY WITH ENDOBRONCHIAL ULTRASOUND;  Surgeon: [name], MD;  Location: [facility] ORS;  Service: Pulmonary;  Laterality: Right;    Allergies: Other, Penicillins, and Sulfa antibiotics  Medications: Prior to Admission medications   Medication Sig Start Date End Date Taking? Authorizing Provider  albuterol (PROVENTIL HFA;VENTOLIN HFA) 108 (90 Base) MCG/ACT inhaler Inhale 1-2 puffs into the lungs every 6 (six) hours as needed for wheezing or shortness  of breath.   Yes [provider]  ascorbic acid (VITAMIN C) 100 MG tablet Take by mouth.   Yes [provider]  B Complex Vitamins (VITAMIN B COMPLEX PO) Take 1 tablet by mouth daily.    Yes [provider]  Biotin w/ Vitamins C & E (HAIR/SKIN/NAILS PO) Take 1 tablet by mouth daily.   Yes [provider]  Calcium Carb-Cholecalciferol (CALCIUM 600+D) 600-800 MG-UNIT TABS Take 2 tablets by mouth daily.   Yes [provider]  Cholecalciferol (D3 MAXIMUM STRENGTH) 5000 units capsule Take 5,000 Units by mouth daily.   Yes [provider]  COLLAGEN PO Take 1,000 mg by mouth daily.   Yes [provider]  diclofenac Sodium (VOLTAREN) 1 % GEL Apply topically. 07/08/19 07/07/20 Yes [provider]  fexofenadine (ALLEGRA) 180 MG tablet Take 180 mg by mouth daily as needed for allergies.    Yes [provider]  fluticasone (FLONASE) 50 MCG/ACT nasal spray Place 2 sprays into both nostrils daily as needed for allergies or rhinitis.   Yes [provider]  Fluticasone-Salmeterol (ADVAIR) 250-50 MCG/DOSE AEPB Inhale 1 puff into the lungs 2 (two) times daily as needed (for respiratory issues.).   Yes [provider]  lansoprazole (PREVACID) 30 MG capsule Take 30 mg by mouth daily.    Yes [provider]  lidocaine-prilocaine (EMLA) cream Apply 1 application topically as needed. Apply generously over the Mediport 45 minutes prior to chemotherapy. 02/13/17  Yes Cammie Sickle, MD  losartan (COZAAR) 25 MG tablet Take 25 mg by mouth at bedtime.  12/08/16  Yes [provider]  meclizine (ANTIVERT) 25 MG tablet  06/26/19  Yes [provider]  metoCLOPramide (REGLAN) 10 MG tablet Take 1 tablet (10 mg total) by mouth every 8 (eight) hours as needed for nausea. 02/13/17  Yes Cammie Sickle, MD  mupirocin ointment (BACTROBAN) 2 % Apply 1 application topically as needed.  12/28/17  Yes [provider]  ondansetron (ZOFRAN) 8 MG tablet Take 1 tablet (8 mg total) by mouth every 8 (eight) hours as needed for nausea or vomiting (start 3 days; after chemo). 02/13/17  Yes Cammie Sickle, MD  osimertinib mesylate (TAGRISSO) 80 MG tablet Take 1 tablet (80 mg total) by mouth daily. 07/22/19  Yes Lloyd Huger, MD  polyethylene glycol (MIRALAX / GLYCOLAX) packet Take 17 g by mouth daily as needed for mild constipation.   Yes [provider]  PREMARIN 1.25 MG tablet Take 1.25 mg by mouth daily.  08/02/17  Yes [provider]    prochlorperazine (COMPAZINE) 10 MG tablet Take 1 tablet (10 mg total) by mouth every 6 (six) hours as needed for nausea or vomiting. 07/24/18  Yes Lloyd Huger, MD  pseudoephedrine-guaifenesin (MUCINEX D) 60-600 MG 12 hr tablet Take 1 tablet by mouth every 12 (twelve) hours as needed.    Yes [provider]  triamcinolone (KENALOG) 0.1 % paste Use as directed 1 application in the mouth or throat 2 (two) times daily. 07/24/18  Yes Lloyd Huger, MD  Vitamin D, Ergocalciferol, (DRISDOL) 50000 units CAPS capsule Take 50,000 Units by mouth every Friday.  02/15/18  Yes [provider]  cyclobenzaprine (FLEXERIL) 5 MG tablet Take 5 mg by mouth 3 (three) times daily as needed for muscle spasms.    [provider]  doxycycline (VIBRA-TABS) 100 MG tablet Take 100 mg by mouth 2 (two) times daily.    [provider]  GAVILYTE-N WITH FLAVOR PACK 420 g solution  03/28/19   [provider]  meloxicam (MOBIC) 7.5 MG tablet Take 7.5 mg by mouth daily.    [provider]  mometasone (ELOCON) 0.1 % lotion Apply 1 application topically as needed.  11/06/18   [provider]     Family History  Problem Relation Age of Onset  . Breast cancer Maternal Aunt   . Breast cancer Paternal Aunt   . Breast cancer Maternal Grandmother   . Breast cancer Maternal Aunt   . Breast cancer Paternal Aunt   . Healthy Mother   . Healthy Father     Social History   Socioeconomic History  . Marital status: Married    Spouse name: Not on file  . Number of children: Not on file  . Years of education: Not on file  . Highest education level: Not on file  Occupational History  . Not on file  Tobacco Use  . Smoking status: Never Smoker  . Smokeless tobacco: Never Used  Substance and Sexual Activity  . Alcohol use: Yes    Alcohol/week: 7.0 standard drinks    Types: 7 Glasses of wine per week  . Drug use: No  . Sexual activity: Not on file  Other Topics  Concern  . Not on file  Social History Narrative  . Not on file   Social Determinants of Health   Financial Resource Strain:   . Difficulty of Paying Living Expenses: Not on file  Food Insecurity:   . Worried About Charity fundraiser in the Last Year: Not on file  . Ran Out of Food in the Last Year: Not on file  Transportation Needs:   . Lack of Transportation (Medical): Not on file  . Lack of Transportation (Non-Medical): Not on file  Physical Activity:   . Days of Exercise per Week: Not on file  . Minutes of Exercise per Session: Not on file  Stress:   . Feeling of Stress : Not on file  Social Connections:   . Frequency of Communication with Friends and Family: Not on file  . Frequency of Social Gatherings with Friends and Family: Not on file  . Attends Religious Services: Not on file  . Active Member of Clubs or Organizations: Not on file  . Attends Archivist Meetings: Not on file  . Marital Status: Not on file    ECOG Status: 0 - Asymptomatic  Review of Systems: A 12 point ROS discussed and pertinent positives are indicated in the HPI above.  All other systems are negative.  Review of Systems  Constitutional: Negative.   Respiratory: Positive for cough. Negative for shortness of breath.   Cardiovascular: Negative.   Gastrointestinal: Positive for diarrhea. Negative for abdominal distention, abdominal pain, blood in stool, nausea and vomiting.  Genitourinary: Negative.   Musculoskeletal: Negative.   Neurological: Negative.   Hematological: Negative.     Vital Signs: BP (!) 142/83   Pulse 79   Temp 98 F (36.7 C) (Oral)   Resp 17   Ht 5\' 3"  (1.6 m)   Wt 61.2 kg   SpO2 98%   BMI 23.91 kg/m   Physical Exam Vitals reviewed.  Constitutional:      General: She is not in acute distress.    Appearance: Normal appearance. She is normal weight. She is not ill-appearing, toxic-appearing or diaphoretic.  Cardiovascular:     Rate and Rhythm: Normal  rate and regular rhythm.  Heart sounds: Normal heart sounds. No murmur. No friction rub. No gallop.   Pulmonary:     Effort: Pulmonary effort is normal. No respiratory distress.     Breath sounds: Normal breath sounds. No stridor. No wheezing, rhonchi or rales.  Abdominal:     General: Abdomen is flat. Bowel sounds are normal. There is no distension.     Palpations: Abdomen is soft. There is no mass.     Tenderness: There is no abdominal tenderness. There is no left CVA tenderness, guarding or rebound.     Hernia: No hernia is present.  Musculoskeletal:        General: No swelling.     Cervical back: Neck supple.  Skin:    General: Skin is warm and dry.  Neurological:     General: No focal deficit present.     Mental Status: She is alert and oriented to person, place, and time.     Imaging: DG Chest 1 View  Result Date: 07/26/2019 CLINICAL DATA:  History of lung carcinoma and status post right thoracentesis. EXAM: CHEST  1 VIEW COMPARISON:  PET scan on 07/09/2019 FINDINGS: No pneumothorax after right thoracentesis. No significant right pleural fluid remains. Right hilar density and tissue retraction remains related to prior surgery. No edema or airspace consolidation. IMPRESSION: No pneumothorax or residual right pleural fluid after thoracentesis. Right hilar density with tissue retraction remains present related to prior surgery. Electronically Signed   By: Aletta Edouard M.D.   On: 07/26/2019 16:16   CT ABDOMEN W WO CONTRAST  Result Date: 07/26/2019 CLINICAL DATA:  Recurrent adenocarcinoma of the lung abnormal finding on recent PET scan in the left kidney. EXAM: CT ABDOMEN WITHOUT AND WITH CONTRAST TECHNIQUE: Multidetector CT imaging of the abdomen was performed following the standard protocol before and following the bolus administration of intravenous contrast. CONTRAST:  135mL OMNIPAQUE IOHEXOL 300 MG/ML  SOLN COMPARISON:  03/19/2019, PET exam 07/09/2019 FINDINGS: Lower chest: No  oval of Accu a shin of pleural fluid in the right chest. Hepatobiliary: No signs of focal suspicious abnormality in the liver. Subtle low attenuation in the right hepatic lobe unchanged, likely small cysts. Pancreas: Pancreas is normal. Spleen: Spleen is normal size without focal lesion. Adrenals/Urinary Tract: Adrenal glands are unremarkable. Lesion in the posterior left kidney with ill-defined margins and hypoenhancement measures 1.7 x 1.5 cm, centered in the interpolar portion of the left kidney. Other small low-density foci are without change. This area measured approximately 9 mm on the study of 03/19/2019. Stomach/Bowel: No acute gastrointestinal process. Pelvic bowel loops are not imaged. Vascular/Lymphatic: Vascular structures in the abdomen are patent with mild atherosclerotic changes. No signs of adenopathy. Other: No abdominal wall hernia or abnormality. Musculoskeletal: No signs of acute bone finding or destructive bone process. Spinal degenerative changes. IMPRESSION: 1. Lesion in the posterior left kidney with ill-defined margins and hypoenhancement measures 1.7 x 1.5 cm. This area measured approximately 9 mm on the study of 03/19/2019. Based on appearance and hypoenhancement this is most suspicious for metastasis rather than a primary renal cell carcinoma. Biopsy may be helpful. Electronically Signed   By: Zetta Bills M.D.   On: 07/26/2019 16:55   US THORACENTESIS ASP PLEURAL SPACE W/IMG GUIDE  Result Date: 07/26/2019 CLINICAL DATA:  History of recurrent lung carcinoma with right pleural effusion. EXAM: ULTRASOUND GUIDED RIGHT THORACENTESIS COMPARISON:  PET scan on 07/09/2019 PROCEDURE: An ultrasound guided thoracentesis was thoroughly discussed with the patient and questions answered. The benefits, risks, alternatives and  complications were also discussed. The patient understands and wishes to proceed with the procedure. Written consent was obtained. Ultrasound was performed to localize and  mark an adequate pocket of fluid in the right chest. The area was then prepped and draped in the normal sterile fashion. 1% Lidocaine was used for local anesthesia. Under ultrasound guidance a 6 French Safe-T-Centesis catheter was introduced. Thoracentesis was performed. The catheter was removed and a dressing applied. COMPLICATIONS: None FINDINGS: A total of approximately 500 mL of yellowish fluid was removed. A fluid sample was sent for laboratory analysis. IMPRESSION: Successful ultrasound guided right thoracentesis yielding 500 mL of pleural fluid. Electronically Signed   By: Aletta Edouard M.D.   On: 07/26/2019 16:12    Labs:  CBC: Recent Labs    03/19/19 1142 04/12/19 1307 06/20/19 0958 08/15/19 0940  WBC 4.0 3.1* 3.2* 3.1*  HGB 11.1* 11.5* 11.2* 12.0  HCT 33.7* 35.5* 34.4* 35.6*  PLT 207 175 164 186    COAGS: Recent Labs    04/12/19 1307 08/15/19 0940  INR 1.0 0.9    BMP: Recent Labs    10/25/18 1324 01/28/19 1157 03/19/19 1142 06/20/19 0958  NA 137 135 132* 136  K 4.2 4.1 4.3 4.0  CL 105 103 99 104  CO2 26 24 24 25   GLUCOSE 112* 97 98 94  BUN 20 18 19 16   CALCIUM 8.9 8.6* 8.9 8.6*  CREATININE 0.91 0.79 0.80 0.87  GFRNONAA >60 >60 >60 >60  GFRAA >60 >60 >60 >60    LIVER FUNCTION TESTS: Recent Labs    10/25/18 1324 01/28/19 1157 03/19/19 1142 06/20/19 0958  BILITOT 0.4 0.5 0.7 0.6  AST 38 34 28 30  ALT 31 29 21 25   ALKPHOS 66 64 57 56  PROT 6.9 6.5 7.3 6.4*  ALBUMIN 3.7 3.5 3.7 3.5    TUMOR MARKERS: No results for input(s): AFPTM, CEA, CA199, CHROMGRNA in the last 8760 hours.  Assessment and Plan:  For US guided biopsy of left renal mass today. Risks and benefits of renal mass biopsy was discussed with the patient and/or patient's family including, but not limited to bleeding, infection, damage to adjacent structures or low yield requiring additional tests. All of the questions were answered and there is agreement to proceed. Consent signed and  in chart.  Thank you for this interesting consult.  I greatly enjoyed meeting KIRSTON LUTY and look forward to participating in their care.  A copy of this report was sent to the requesting provider on this date.  Electronically Signed: Azzie Roup, MD 08/15/2019, 10:26 AM     I spent a total of  15 Minutes in face to face in clinical consultation, greater than 50% of which was counseling/coordinating care for renal mass biopsy.

## 2019-08-15 NOTE — Discharge Instructions (Signed)
Percutaneous Kidney Biopsy, Care After This sheet gives you information about how to care for yourself after your procedure. Your health care provider may also give you more specific instructions. If you have problems or questions, contact your health care provider. What can I expect after the procedure? After the procedure, it is common to have:  Pain or soreness near the biopsy site.  Pink or cloudy urine for 24 hours after the procedure. Follow these instructions at home: Activity  Return to your normal activities as told by your health care provider. Ask your health care provider what activities are safe for you.  If you were given a sedative during the procedure, it can affect you for several hours. Do not drive or operate machinery until your health care provider says that it is safe.  Do not lift anything that is heavier than 10 lb (4.5 kg), or the limit that you are told, until your health care provider says that it is safe.  Avoid activities that take a lot of effort (are strenuous) until your health care provider approves. Most people will have to wait 2 weeks before returning to activities such as exercise or sex. General instructions   Take over-the-counter and prescription medicines only as told by your health care provider.  You may eat and drink after your procedure. Follow instructions from your health care provider about eating or drinking restrictions.  Check your biopsy site every day for signs of infection. Check for: ? More redness, swelling, or pain. ? Fluid or blood. ? Warmth. ? Pus or a bad smell.  Keep all follow-up visits as told by your health care provider. This is important. Contact a health care provider if:  You have more redness, swelling, or pain around your biopsy site.  You have fluid or blood coming from your biopsy site.  Your biopsy site feels warm to the touch.  You have pus or a bad smell coming from your biopsy site.  You have blood  in your urine more than 24 hours after your procedure.  You have a fever. Get help right away if:  Your urine is dark red or brown.  You cannot urinate.  It burns when you urinate.  You feel dizzy or light-headed.  You have severe pain in your abdomen or side. Summary  After the procedure, it is common to have pain or soreness at the biopsy site and pink or cloudy urine for the first 24 hours.  Check your biopsy site each day for signs of infection, such as more redness, swelling, or pain; fluid, blood, pus or a bad smell coming from the biopsy site; or the biopsy site feeling warm to touch.  Return to your normal activities as told by your health care provider. This information is not intended to replace advice given to you by your health care provider. Make sure you discuss any questions you have with your health care provider. Document Revised: 02/21/2019 Document Reviewed: 02/21/2019 Elsevier Patient Education  Organ.    Moderate Conscious Sedation, Adult, Care After These instructions provide you with information about caring for yourself after your procedure. Your health care provider may also give you more specific instructions. Your treatment has been planned according to current medical practices, but problems sometimes occur. Call your health care provider if you have any problems or questions after your procedure. What can I expect after the procedure? After your procedure, it is common:  To feel sleepy for several hours.  To  feel clumsy and have poor balance for several hours.  To have poor judgment for several hours.  To vomit if you eat too soon. Follow these instructions at home: For at least 24 hours after the procedure:   Do not: ? Participate in activities where you could fall or become injured. ? Drive. ? Use heavy machinery. ? Drink alcohol. ? Take sleeping pills or medicines that cause drowsiness. ? Make important decisions or sign  legal documents. ? Take care of children on your own.  Rest. Eating and drinking  Follow the diet recommended by your health care provider.  If you vomit: ? Drink water, juice, or soup when you can drink without vomiting. ? Make sure you have little or no nausea before eating solid foods. General instructions  Have a responsible adult stay with you until you are awake and alert.  Take over-the-counter and prescription medicines only as told by your health care provider.  If you smoke, do not smoke without supervision.  Keep all follow-up visits as told by your health care provider. This is important. Contact a health care provider if:  You keep feeling nauseous or you keep vomiting.  You feel light-headed.  You develop a rash.  You have a fever. Get help right away if:  You have trouble breathing. This information is not intended to replace advice given to you by your health care provider. Make sure you discuss any questions you have with your health care provider. Document Revised: 06/02/2017 Document Reviewed: 10/10/2015 Elsevier Patient Education  2020 [employer].

## 2019-08-17 NOTE — Progress Notes (Signed)
[place]  [phone] [phone]  ID: BRINNLEY LACAP OB: 28-Dec-1950  MR#: [id number]  PFX#:[id number]  Patient Care Team: [name], MD as PCP - General (Internal Medicine)  I connected with [name] on 08/20/19 at  2:15 PM EST by video enabled telemedicine visit and verified that I am speaking with the correct person using two identifiers.   I discussed the limitations, risks, security and privacy concerns of performing an evaluation and management service by telemedicine and the availability of in-person appointments. I also discussed with the patient that there may be a patient responsible charge related to this service. The patient expressed understanding and agreed to proceed.   Other persons participating in the visit and their role in the encounter: Patient, MD.  Patient's location: Home. Provider's location: Clinic.  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  INTERVAL HISTORY: Patient agreed to video assisted telemedicine visit for further evaluation and discussion of her imaging results.  She currently feels well and is asymptomatic.  She tolerated her kidney biopsy without significant side effects.  She continues to tolerate Tagrisso. She has no neurologic complaints.  She denies any recent fevers or illnesses.  She has a good appetite and denies weight loss.  She denies any chest pain, shortness of breath, cough, or hemoptysis.  She denies any nausea, vomiting, constipation, or diarrhea.  She has no urinary complaints.  Patient offers no specific complaints today.  REVIEW OF SYSTEMS:   Review of Systems  Constitutional: Negative.  Negative for fever, malaise/fatigue and weight loss.  Respiratory: Negative.  Negative for cough, hemoptysis and shortness of breath.   Cardiovascular: Negative.  Negative for chest pain and leg swelling.  Gastrointestinal: Negative for abdominal  pain, constipation, diarrhea and nausea.  Genitourinary: Negative.  Negative for dysuria and flank pain.  Musculoskeletal: Negative.  Negative for back pain and joint pain.  Skin: Negative.  Negative for rash.  Neurological: Negative.  Negative for sensory change, focal weakness, weakness and headaches.  Psychiatric/Behavioral: Negative.  The patient is not nervous/anxious.     As per HPI. Otherwise, a complete review of systems is negative.  PAST MEDICAL HISTORY: Past Medical History:  Diagnosis Date  . Abnormal echocardiogram   . Acid reflux   . Allergic rhinitis   . Anemia    HAD TO HAVE IRON INFUSIONS BACK IN 2015  . Arthritis    bil hands and back  . Asthma   . Bloody discharge from left nipple   . Breast mass    bilateral   . Cancer of right lung ([place]) 2015   lung cancer - chemo, Dr Genevive Bi removed RML Lobectomy.   . Colon polyps   . COPD (chronic obstructive pulmonary disease) ([place])   . Dyspnea    WITH EXERTION  . Hypertension   . Malignant neoplasm of middle lobe of right lung ([place])   . Mitral valve prolapse   . Mitral valve prolapse   . Non-small cell lung cancer ([place])   . Personal history of chemotherapy    F/U Lung Cancer  . Personal history of radiation therapy    Lung cancer  . Primary cancer of right middle lobe of lung ([place]) 03/11/2016  . Stage IV adenocarcinoma of lung, unspecified laterality ([place])     PAST SURGICAL HISTORY: Past Surgical History:  Procedure Laterality Date  . ABDOMINAL HYSTERECTOMY    . BAND HEMORRHOIDECTOMY    . BREAST BIOPSY  Left 03/01/2019   Procedure: LEFT BREAST EXCISIONAL BIOPSY WITH NEEDLE LOCALIZATION;  Surgeon: Herbert Pun, MD;  Location: [facility] ORS;  Service: General;  Laterality: Left;  . BREAST EXCISIONAL BIOPSY Left 1994 (-), 2004 (-)   benign  . BREAST EXCISIONAL BIOPSY Left 03/01/2019   path pending x 2  . COLONOSCOPY  2008    Dr. Donnella Sham  . COLONOSCOPY WITH PROPOFOL N/A 04/12/2019   Procedure: COLONOSCOPY WITH  PROPOFOL;  Surgeon: Lollie Sails, MD;  Location: [facility] ENDOSCOPY;  Service: Endoscopy;  Laterality: N/A;  . KNEE SURGERY  2007  . LUNG LOBECTOMY Right    middle lobe  . PORTACATH PLACEMENT Right 02/09/2017   Procedure: INSERTION PORT-A-CATH;  Surgeon: Nestor Lewandowsky, MD;  Location: [facility] ORS;  Service: General;  Laterality: Right;  . SALIVARY GLAND SURGERY Left 1983   with lymph node removal also  . SALIVARY GLAND SURGERY     salivary gland removal   . UPPER GI ENDOSCOPY  2008  . VIDEO BRONCHOSCOPY WITH ENDOBRONCHIAL ULTRASOUND Right 01/24/2017   Procedure: VIDEO BRONCHOSCOPY WITH ENDOBRONCHIAL ULTRASOUND;  Surgeon: [name], MD;  Location: [facility] ORS;  Service: Pulmonary;  Laterality: Right;    FAMILY HISTORY: Family History  Problem Relation Age of Onset  . Breast cancer Maternal Aunt   . Breast cancer Paternal Aunt   . Breast cancer Maternal Grandmother   . Breast cancer Maternal Aunt   . Breast cancer Paternal Aunt   . Healthy Mother   . Healthy Father     ADVANCED DIRECTIVES (Y/N):  N  HEALTH MAINTENANCE: Social History   Tobacco Use  . Smoking status: Never Smoker  . Smokeless tobacco: Never Used  Substance Use Topics  . Alcohol use: Yes    Alcohol/week: 7.0 standard drinks    Types: 7 Glasses of wine per week  . Drug use: No     Colonoscopy:  PAP:  Bone density:  Lipid panel:  Allergies  Allergen Reactions  . Other Other (See Comments)    Cat gut sutures get infected Cat gut sutures get infected  . Penicillins Swelling and Rash    Did it involve swelling of the face/tongue/throat, SOB, or low BP? Unknown Did it involve sudden or severe rash/hives, skin peeling, or any reaction on the inside of your mouth or nose? No Did you need to seek medical attention at a hospital or doctor's office? Yes When did it last happen?30 + years  If all above answers are "NO", may proceed with cephalosporin use.    . Sulfa Antibiotics Nausea And  Vomiting    Current Outpatient Medications  Medication Sig Dispense Refill  . albuterol (PROVENTIL HFA;VENTOLIN HFA) 108 (90 Base) MCG/ACT inhaler Inhale 1-2 puffs into the lungs every 6 (six) hours as needed for wheezing or shortness of breath.    Marland Kitchen ascorbic acid (VITAMIN C) 100 MG tablet Take by mouth.    . B Complex Vitamins (VITAMIN B COMPLEX PO) Take 1 tablet by mouth daily.     . Biotin w/ Vitamins C & E (HAIR/SKIN/NAILS PO) Take 1 tablet by mouth daily.    . Calcium Carb-Cholecalciferol (CALCIUM 600+D) 600-800 MG-UNIT TABS Take 2 tablets by mouth daily.    . Cholecalciferol (D3 MAXIMUM STRENGTH) 5000 units capsule Take 5,000 Units by mouth daily.    . COLLAGEN PO Take 1,000 mg by mouth daily.    . cyclobenzaprine (FLEXERIL) 5 MG tablet Take 1 tablet (5 mg total) by mouth 3 (three) times daily as needed  for muscle spasms. 30 tablet 0  . diclofenac Sodium (VOLTAREN) 1 % GEL Apply topically.    Marland Kitchen doxycycline (VIBRA-TABS) 100 MG tablet Take 100 mg by mouth 2 (two) times daily.    . fexofenadine (ALLEGRA) 180 MG tablet Take 180 mg by mouth daily as needed for allergies.     . fluticasone (FLONASE) 50 MCG/ACT nasal spray Place 2 sprays into both nostrils daily as needed for allergies or rhinitis.    . Fluticasone-Salmeterol (ADVAIR) 250-50 MCG/DOSE AEPB Inhale 1 puff into the lungs 2 (two) times daily as needed (for respiratory issues.).    Marland Kitchen GAVILYTE-N WITH FLAVOR PACK 420 g solution     . lansoprazole (PREVACID) 30 MG capsule Take 30 mg by mouth daily.     Marland Kitchen lidocaine-prilocaine (EMLA) cream Apply 1 application topically as needed. Apply generously over the Mediport 45 minutes prior to chemotherapy. 30 g 0  . losartan (COZAAR) 25 MG tablet Take 25 mg by mouth at bedtime.     . meclizine (ANTIVERT) 25 MG tablet     . meloxicam (MOBIC) 7.5 MG tablet Take 7.5 mg by mouth daily.    . metoCLOPramide (REGLAN) 10 MG tablet Take 1 tablet (10 mg total) by mouth every 8 (eight) hours as needed for  nausea. 40 tablet 3  . mometasone (ELOCON) 0.1 % lotion Apply 1 application topically as needed.     . mupirocin ointment (BACTROBAN) 2 % Apply 1 application topically as needed.   0  . ondansetron (ZOFRAN) 8 MG tablet Take 1 tablet (8 mg total) by mouth every 8 (eight) hours as needed for nausea or vomiting (start 3 days; after chemo). 40 tablet 1  . osimertinib mesylate (TAGRISSO) 80 MG tablet Take 1 tablet (80 mg total) by mouth daily. 30 tablet 5  . polyethylene glycol (MIRALAX / GLYCOLAX) packet Take 17 g by mouth daily as needed for mild constipation.    Marland Kitchen PREMARIN 1.25 MG tablet Take 1.25 mg by mouth daily.   1  . prochlorperazine (COMPAZINE) 10 MG tablet Take 1 tablet (10 mg total) by mouth every 6 (six) hours as needed for nausea or vomiting. 30 tablet 0  . pseudoephedrine-guaifenesin (MUCINEX D) 60-600 MG 12 hr tablet Take 1 tablet by mouth every 12 (twelve) hours as needed.     . triamcinolone (KENALOG) 0.1 % paste Use as directed 1 application in the mouth or throat 2 (two) times daily. 5 g 12  . Vitamin D, Ergocalciferol, (DRISDOL) 50000 units CAPS capsule Take 50,000 Units by mouth every Friday.      No current facility-administered medications for this visit.   Facility-Administered Medications Ordered in Other Visits  Medication Dose Route Frequency Provider Last Rate Last Admin  . heparin lock flush 100 unit/mL  500 Units Intravenous Once Lloyd Huger, MD      . sodium chloride flush (NS) 0.9 % injection 10 mL  10 mL Intravenous PRN Lloyd Huger, MD      . sodium chloride flush (NS) 0.9 % injection 10 mL  10 mL Intravenous PRN Lloyd Huger, MD   10 mL at 01/22/18 1419    OBJECTIVE: There were no vitals filed for this visit.   There is no height or weight on file to calculate BMI.    ECOG FS:0 - Asymptomatic  General: Well-developed, well-nourished, no acute distress. HEENT: Normocephalic. Neuro: Alert, answering all questions appropriately. Cranial  nerves grossly intact. Psych: Normal affect.  LAB RESULTS:  Lab Results  Component Value Date   NA 136 06/20/2019   K 4.0 06/20/2019   CL 104 06/20/2019   CO2 25 06/20/2019   GLUCOSE 94 06/20/2019   BUN 16 06/20/2019   CREATININE 0.87 06/20/2019   CALCIUM 8.6 (L) 06/20/2019   PROT 6.4 (L) 06/20/2019   ALBUMIN 3.5 06/20/2019   AST 30 06/20/2019   ALT 25 06/20/2019   ALKPHOS 56 06/20/2019   BILITOT 0.6 06/20/2019   GFRNONAA >60 06/20/2019   GFRAA >60 06/20/2019    Lab Results  Component Value Date   WBC 3.1 (L) 08/15/2019   NEUTROABS 1.9 06/20/2019   HGB 12.0 08/15/2019   HCT 35.6 (L) 08/15/2019   MCV 89.4 08/15/2019   PLT 186 08/15/2019     STUDIES: DG Chest 1 View  Result Date: 07/26/2019 CLINICAL DATA:  History of lung carcinoma and status post right thoracentesis. EXAM: CHEST  1 VIEW COMPARISON:  PET scan on 07/09/2019 FINDINGS: No pneumothorax after right thoracentesis. No significant right pleural fluid remains. Right hilar density and tissue retraction remains related to prior surgery. No edema or airspace consolidation. IMPRESSION: No pneumothorax or residual right pleural fluid after thoracentesis. Right hilar density with tissue retraction remains present related to prior surgery. Electronically Signed   By: Aletta Edouard M.D.   On: 07/26/2019 16:16   CT ABDOMEN W WO CONTRAST  Result Date: 07/26/2019 CLINICAL DATA:  Recurrent adenocarcinoma of the lung abnormal finding on recent PET scan in the left kidney. EXAM: CT ABDOMEN WITHOUT AND WITH CONTRAST TECHNIQUE: Multidetector CT imaging of the abdomen was performed following the standard protocol before and following the bolus administration of intravenous contrast. CONTRAST:  167m OMNIPAQUE IOHEXOL 300 MG/ML  SOLN COMPARISON:  03/19/2019, PET exam 07/09/2019 FINDINGS: Lower chest: No oval of Accu a shin of pleural fluid in the right chest. Hepatobiliary: No signs of focal suspicious abnormality in the liver.  Subtle low attenuation in the right hepatic lobe unchanged, likely small cysts. Pancreas: Pancreas is normal. Spleen: Spleen is normal size without focal lesion. Adrenals/Urinary Tract: Adrenal glands are unremarkable. Lesion in the posterior left kidney with ill-defined margins and hypoenhancement measures 1.7 x 1.5 cm, centered in the interpolar portion of the left kidney. Other small low-density foci are without change. This area measured approximately 9 mm on the study of 03/19/2019. Stomach/Bowel: No acute gastrointestinal process. Pelvic bowel loops are not imaged. Vascular/Lymphatic: Vascular structures in the abdomen are patent with mild atherosclerotic changes. No signs of adenopathy. Other: No abdominal wall hernia or abnormality. Musculoskeletal: No signs of acute bone finding or destructive bone process. Spinal degenerative changes. IMPRESSION: 1. Lesion in the posterior left kidney with ill-defined margins and hypoenhancement measures 1.7 x 1.5 cm. This area measured approximately 9 mm on the study of 03/19/2019. Based on appearance and hypoenhancement this is most suspicious for metastasis rather than a primary renal cell carcinoma. Biopsy may be helpful. Electronically Signed   By: GZetta BillsM.D.   On: 07/26/2019 16:55   [place]OPSY (KIDNEY)  Result Date: 08/15/2019 INDICATION: History of recurrent adenocarcinoma of the lung with enlarging mass in the left kidney now requiring biopsy for tissue diagnosis. EXAM: ULTRASOUND GUIDED CORE BIOPSY OF LEFT RENAL MASS MEDICATIONS: None. ANESTHESIA/SEDATION: Fentanyl 100 mcg IV; Versed 2.0 mg IV Moderate Sedation Time:  19 minutes. The patient was continuously monitored during the procedure by the interventional radiology nurse under my direct supervision. PROCEDURE: The procedure, risks, benefits, and alternatives were explained to the patient. Questions regarding the  procedure were encouraged and answered. The patient understands and consents to the  procedure. A time-out was performed prior to initiating the procedure. Ultrasound was performed in a prone position to localize the left kidney and left renal mass. The left flank region was prepped with chlorhexidine in a sterile fashion, and a sterile drape was applied covering the operative field. A sterile gown and sterile gloves were used for the procedure. Local anesthesia was provided with 1% Lidocaine. Under ultrasound guidance, a 17 gauge trocar needle was advanced to the margin of a lesion within the posterior interpolar left kidney. After confirming needle tip position, 3 separate coaxial 18 gauge core biopsy samples were obtained. Samples were submitted in formalin. The outer needle was removed and additional ultrasound performed. COMPLICATIONS: None immediate. FINDINGS: The lesion within the posterior interpolar renal cortex measures approximately 1.8-1.9 cm in greatest diameter by ultrasound and appears relatively hyperechoic compared to adjacent renal cortex. Solid tissue was obtained. IMPRESSION: Ultrasound-guided core biopsy performed of a posterior interpolar left renal mass measuring approximately 1.8-1.9 cm in greatest diameter by ultrasound. Electronically Signed   By: Aletta Edouard M.D.   On: 08/15/2019 14:47   US THORACENTESIS ASP PLEURAL SPACE W/IMG GUIDE  Result Date: 07/26/2019 CLINICAL DATA:  History of recurrent lung carcinoma with right pleural effusion. EXAM: ULTRASOUND GUIDED RIGHT THORACENTESIS COMPARISON:  PET scan on 07/09/2019 PROCEDURE: An ultrasound guided thoracentesis was thoroughly discussed with the patient and questions answered. The benefits, risks, alternatives and complications were also discussed. The patient understands and wishes to proceed with the procedure. Written consent was obtained. Ultrasound was performed to localize and mark an adequate pocket of fluid in the right chest. The area was then prepped and draped in the normal sterile fashion. 1% Lidocaine  was used for local anesthesia. Under ultrasound guidance a 6 French Safe-T-Centesis catheter was introduced. Thoracentesis was performed. The catheter was removed and a dressing applied. COMPLICATIONS: None FINDINGS: A total of approximately 500 mL of yellowish fluid was removed. A fluid sample was sent for laboratory analysis. IMPRESSION: Successful ultrasound guided right thoracentesis yielding 500 mL of pleural fluid. Electronically Signed   By: Aletta Edouard M.D.   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  She initially received 4 cycles of adjuvant cisplatin and Alimta completing on January 23, 2014.  Patient was on the [facility] protocol for maintenance Tarceva versus placebo, but discontinued treatment in November 2015 because of significant side effects.  Although blinded, presumption was she was receiving Tarceva.  She recently was noted to have a recurrence confirmed by right paratracheal lymph node biopsy on January 24, 2017.  PET scan on February 03, 2017 did not reveal any distant metastasis.  She underwent treatment with weekly carboplatinum and Taxol along with daily XRT finishing in October 2018.  She initiated maintenance durvalumab on June 29, 2017.  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  Patient started on Tagrisso in April 2019.   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  PLAN:   1. 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  Patient initiated Tagrisso in April 2019.  PET scan results from July 09, 2019 reviewed independently which may show mild progression of disease.  Patient does have isolated metastasis in left kidney that appears to  be enlarging more so than other areas and patient has inquired about possible ablation.  We will  further discuss with interventional radiology and continue Tagrisso 80 mg daily at this time.  Plan to reimage with CT scan in 3 months. 2.  Diarrhea: Chronic and unchanged.  Patient does not complain of this today.  Continue Lomotil as prescribed. 3.  Cough: Chronic and unchanged, monitor. 4.  Fingernail cracking: Patient does not complain of this today.  Possibly secondary to [facility]. 5.  Breast lesion: Benign papilloma.  Patient underwent lumpectomy on March 01, 2019. 6.  Back/right hip pain: No obvious etiology on CT scan.  Monitor. 7.  Right upper lobe nodule: Resolved. 8.  Right pleural effusion: Resolved.  Patient underwent thoracentesis which removed with 500 mL of fluid.   I provided 30 minutes of face-to-face video visit time during this encounter which included chart review, counseling, and coordination of care as documented above.   Patient expressed understanding and was in agreement with this plan. She also understands that She can call clinic at any time with any questions, concerns, or complaints.    Lloyd Huger, MD   08/20/2019 3:37 PM

## 2019-08-19 LAB — SURGICAL PATHOLOGY

## 2019-08-20 ENCOUNTER — Encounter: Payer: Self-pay | Admitting: Oncology

## 2019-08-20 ENCOUNTER — Inpatient Hospital Stay: Payer: Medicare Other | Attending: Oncology | Admitting: Oncology

## 2019-08-20 DIAGNOSIS — C3490 Malignant neoplasm of unspecified part of unspecified bronchus or lung: Secondary | ICD-10-CM | POA: Diagnosis not present

## 2019-08-20 MED ORDER — CYCLOBENZAPRINE HCL 5 MG PO TABS: 5.0000 mg | ORAL_TABLET | Freq: Three times a day (TID) | ORAL | 0 refills | Status: AC | PRN

## 2019-08-20 NOTE — Progress Notes (Signed)
Patient prescreened for appointment. Patient has no concerns or questions.  

## 2019-08-22 ENCOUNTER — Other Ambulatory Visit: Payer: Medicare Other

## 2019-08-22 NOTE — Progress Notes (Signed)
Tumor Board Documentation  DNIYAH GRANT was presented by Dr Grayland Ormond at our Tumor Board on 08/22/2019, which included representatives from medical oncology, radiation oncology, navigation, pathology, radiology, surgical, internal medicine, genetics, pulmonology, palliative care, research.  Umi currently presents as a current patient, for discussion with history of the following treatments: active survellience, surgical intervention(s), immunotherapy, neoadjuvant chemoradiation.  Additionally, we reviewed previous medical and familial history, history of present illness, and recent lab results along with all available histopathologic and imaging studies. The tumor board considered available treatment options and made the following recommendations: Active surveillance Check EGFR on current biopsy specimen, Refer to Genetics, Ct in 3 months, Possible Abaltion of the Renal tumor vs Systemic Chemotherapy  The following procedures/referrals were also placed: No orders of the defined types were placed in this encounter.   Clinical Trial Status: not discussed   Staging used: AJCC Stage Group  AJCC Staging:       Group: Stage 4 Non small cell Lung cancer with mets to kidney   National site-specific guidelines NCCN were discussed with respect to the case.  Tumor board is a meeting of clinicians from various specialty areas who evaluate and discuss patients for whom a multidisciplinary approach is being considered. Final determinations in the plan of care are those of the provider(s). The responsibility for follow up of recommendations given during tumor board is that of the provider.   Today's extended care, comprehensive team conference, Aneth was not present for the discussion and was not examined.   Multidisciplinary Tumor Board is a multidisciplinary case peer review process.  Decisions discussed in the Multidisciplinary Tumor Board reflect the opinions of the specialists present at the  conference without having examined the patient.  Ultimately, treatment and diagnostic decisions rest with the primary provider(s) and the patient.

## 2019-08-26 ENCOUNTER — Other Ambulatory Visit: Payer: Self-pay | Admitting: Emergency Medicine

## 2019-08-26 DIAGNOSIS — C7900 Secondary malignant neoplasm of unspecified kidney and renal pelvis: Secondary | ICD-10-CM

## 2019-08-26 DIAGNOSIS — C3490 Malignant neoplasm of unspecified part of unspecified bronchus or lung: Secondary | ICD-10-CM

## 2019-08-26 NOTE — Progress Notes (Signed)
Orders only

## 2019-08-27 ENCOUNTER — Other Ambulatory Visit: Payer: Self-pay | Admitting: Oncology

## 2019-08-27 DIAGNOSIS — C3490 Malignant neoplasm of unspecified part of unspecified bronchus or lung: Secondary | ICD-10-CM

## 2019-08-28 ENCOUNTER — Ambulatory Visit
Admission: RE | Admit: 2019-08-28 | Discharge: 2019-08-28 | Disposition: A | Discharge status: Home / Self Care | Payer: Medicare Other | Source: Ambulatory Visit | Attending: Oncology | Admitting: Oncology

## 2019-08-28 ENCOUNTER — Other Ambulatory Visit: Payer: Self-pay

## 2019-08-28 ENCOUNTER — Encounter: Payer: Self-pay | Admitting: *Deleted

## 2019-08-28 DIAGNOSIS — C3490 Malignant neoplasm of unspecified part of unspecified bronchus or lung: Secondary | ICD-10-CM

## 2019-08-28 HISTORY — PX: IR RADIOLOGIST EVAL & MGMT: IMG5224

## 2019-08-28 NOTE — Consult Note (Signed)
  Chief Complaint: Patient was consulted remotely today (TeleHealth) for left renal ablation at the request of LaurenTimothy J.    Referring Physician(s): LaurenTimothy J  History of Present Illness: Lauren Page is a 69 y.o. female known to me after prior thoracentesis and left renal mass biopsy procedures. She has a history of stage IV adenocarcinoma of the right lung. Patient initially underwent resection in May 2015 of a 2.3 cm RUL adenocarcinoma and was noted to have the EGFR mutation.  She initially received 4 cycles of adjuvant cisplatin and Alimta completing on January 23, 2014. She was noted to have a recurrence confirmed by right paratracheal lymph node biopsy on January 24, 2017.  PET scan on February 03, 2017 did not reveal any distant metastasis.  She underwent treatment with weekly carboplatinum and Taxol along with daily XRT finishing in October 2018.  She initiated maintenance durvalumab on June 29, 2017.  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  Patient started on Tagrisso in April 2019.  Cytology on right pleural fluid from 09/27/2017 and 07/26/2019 was positive for metastatic adenocarcinoma. Recent imaging demonstrated an enlarging left renal mass in the posterior mid left kidney measuring 1.5 x 1.7 cm by CT on 07/26/2019 and only about 1 cm on 03/19/2019. PET scan on 07/09/2019 showed some posterior right pleural nodularity and increased activity associated with the left renal mass. Biopsy of the left renal mass on 08/15/2019 was consistent with metastatic adenocarcinoma of lung primary. Given relatively stable disease in her chest, and after multidisciplinary discussion at [facility], Lauren Page has been referred for percutaneous ablation of the renal metastasis for disease control.   Past Medical History:  Diagnosis Date  . Abnormal echocardiogram   . Acid reflux   . Allergic rhinitis   . Anemia    HAD TO HAVE IRON  INFUSIONS BACK IN 2015  . Arthritis    bil hands and back  . Asthma   . Bloody discharge from left nipple   . Breast mass    bilateral   . Cancer of right lung (HCC) 2015   lung cancer - chemo, Lauren Page removed RML Lobectomy.   . Colon polyps   . COPD (chronic obstructive pulmonary disease) (HCC)   . Dyspnea    WITH EXERTION  . Hypertension   . Malignant neoplasm of middle lobe of right lung (HCC)   . Mitral valve prolapse   . Mitral valve prolapse   . Non-small cell lung cancer (HCC)   . Personal history of chemotherapy    F/Page Lung Cancer  . Personal history of radiation therapy    Lung cancer  . Primary cancer of right middle lobe of lung (HCC) 03/11/2016  . Stage IV adenocarcinoma of lung, unspecified laterality (HCC)     Past Surgical History:  Procedure Laterality Date  . ABDOMINAL HYSTERECTOMY    . BAND HEMORRHOIDECTOMY    . BREAST BIOPSY Left 03/01/2019   Procedure: LEFT BREAST EXCISIONAL BIOPSY WITH NEEDLE LOCALIZATION;  Surgeon: Cintron-Diaz, Edgardo, Page;  Location: [facility];  Service: General;  Laterality: Left;  . BREAST EXCISIONAL BIOPSY Left 1994 (-), 2004 (-)   benign  . BREAST EXCISIONAL BIOPSY Left 03/01/2019   path pending x 2  . COLONOSCOPY  2008    Lauren. Skulski  . COLONOSCOPY WITH PROPOFOL N/A 04/12/2019   Procedure: COLONOSCOPY WITH PROPOFOL;  Surgeon: Skulskie, Lauren Page, Page;  Location: [facility];  Service:   Endoscopy;  Laterality: N/A;  . KNEE SURGERY  2007  . LUNG LOBECTOMY Right    middle lobe  . PORTACATH PLACEMENT Right 02/09/2017   Procedure: INSERTION PORT-A-CATH;  Surgeon: Page, Lauren Page;  Location: [facility] OR[facility];  Service: General;  Laterality: Right;  . SALIVARY GLAND SURGERY Left 1983   with lymph node removal also  . SALIVARY GLAND SURGERY     salivary gland removal   . UPPER GI ENDOSCOPY  2008  . VIDEO BRONCHOSCOPY WITH ENDOBRONCHIAL ULTRASOUND Right 01/24/2017   Procedure: VIDEO BRONCHOSCOPY WITH ENDOBRONCHIAL ULTRASOUND;  Surgeon:  Ramachandran, Lauren Page;  Location: [facility];  Service: Pulmonary;  Laterality: Right;    Allergies: Other, Penicillins, and Sulfa antibiotics  Medications: Prior to Admission medications   Medication Sig Start Date End Date Taking? Authorizing Provider  albuterol (PROVENTIL HFA;VENTOLIN HFA) 108 (90 Base) MCG/ACT inhaler Inhale 1-2 puffs into the lungs every 6 (six) hours as needed for wheezing or shortness of breath.    Provider, Historical, Page  ascorbic acid (VITAMIN C) 100 MG tablet Take by mouth.    Provider, Historical, Page  B Complex Vitamins (VITAMIN B COMPLEX PO) Take 1 tablet by mouth daily.     Provider, Historical, Page  Biotin w/ Vitamins C & E (HAIR/SKIN/NAILS PO) Take 1 tablet by mouth daily.    Provider, Historical, Page  Calcium Carb-Cholecalciferol (CALCIUM 600+D) 600-800 MG-UNIT TABS Take 2 tablets by mouth daily.    Provider, Historical, Page  Cholecalciferol (D3 MAXIMUM STRENGTH) 5000 units capsule Take 5,000 Units by mouth daily.    Provider, Historical, Page  COLLAGEN PO Take 1,000 mg by mouth daily.    Provider, Historical, Page  cyclobenzaprine (FLEXERIL) 5 MG tablet Take 1 tablet (5 mg total) by mouth 3 (three) times daily as needed for muscle spasms. 08/20/19   Finnegan, Timothy J, Page  diclofenac Sodium (VOLTAREN) 1 % GEL Apply topically. 07/08/19 07/07/20  Provider, Historical, Page  doxycycline (VIBRA-TABS) 100 MG tablet Take 100 mg by mouth 2 (two) times daily.    Provider, Historical, Page  fexofenadine (ALLEGRA) 180 MG tablet Take 180 mg by mouth daily as needed for allergies.     Provider, Historical, Page  fluticasone (FLONASE) 50 MCG/ACT nasal spray Place 2 sprays into both nostrils daily as needed for allergies or rhinitis.    Provider, Historical, Page  Fluticasone-Salmeterol (ADVAIR) 250-50 MCG/DOSE AEPB Inhale 1 puff into the lungs 2 (two) times daily as needed (for respiratory issues.).    Provider, Historical, Page  GAVILYTE-N WITH FLAVOR PACK 420 g solution  03/28/19    Provider, Historical, Page  lansoprazole (PREVACID) 30 MG capsule Take 30 mg by mouth daily.     Provider, Historical, Page  lidocaine-prilocaine (EMLA) cream Apply 1 application topically as needed. Apply generously over the Mediport 45 minutes prior to chemotherapy. 02/13/17   Brahmanday, Govinda R, Page  losartan (COZAAR) 25 MG tablet Take 25 mg by mouth at bedtime.  12/08/16   Provider, Historical, Page  meclizine (ANTIVERT) 25 MG tablet  06/26/19   Provider, Historical, Page  meloxicam (MOBIC) 7.5 MG tablet Take 7.5 mg by mouth daily.    Provider, Historical, Page  metoCLOPramide (REGLAN) 10 MG tablet Take 1 tablet (10 mg total) by mouth every 8 (eight) hours as needed for nausea. 02/13/17   Brahmanday, Govinda R, Page  mometasone (ELOCON) 0.1 % lotion Apply 1 application topically as needed.  11/06/18   Provider, Historical, Page  mupirocin ointment (BACTROBAN) 2 % Apply 1 application topically   as needed.  12/28/17   Provider, Historical, Page  ondansetron (ZOFRAN) 8 MG tablet Take 1 tablet (8 mg total) by mouth every 8 (eight) hours as needed for nausea or vomiting (start 3 days; after chemo). 02/13/17   Cammie Sickle, Page  osimertinib mesylate (TAGRISSO) 80 MG tablet Take 1 tablet (80 mg total) by mouth daily. 07/22/19   Lloyd Huger, Page  polyethylene glycol (MIRALAX / Floria Raveling) packet Take 17 g by mouth daily as needed for mild constipation.    Provider, Historical, Page  PREMARIN 1.25 MG tablet Take 1.25 mg by mouth daily.  08/02/17   Provider, Historical, Page  prochlorperazine (COMPAZINE) 10 MG tablet Take 1 tablet (10 mg total) by mouth every 6 (six) hours as needed for nausea or vomiting. 07/24/18   Lloyd Huger, Page  pseudoephedrine-guaifenesin (MUCINEX D) 60-600 MG 12 hr tablet Take 1 tablet by mouth every 12 (twelve) hours as needed.     Provider, Historical, Page  triamcinolone (KENALOG) 0.1 % paste Use as directed 1 application in the mouth or throat 2 (two) times daily. 07/24/18   Lloyd Huger, Page  Vitamin D, Ergocalciferol, (DRISDOL) 50000 units CAPS capsule Take 50,000 Units by mouth every Friday.  02/15/18   Provider, Historical, Page     Family History  Problem Relation Age of Onset  . Breast cancer Maternal Aunt   . Breast cancer Paternal Aunt   . Breast cancer Maternal Grandmother   . Breast cancer Maternal Aunt   . Breast cancer Paternal Aunt   . Healthy Mother   . Healthy Father     Social History   Socioeconomic History  . Marital status: Married    Spouse name: Not on file  . Number of children: Not on file  . Years of education: Not on file  . Highest education level: Not on file  Occupational History  . Not on file  Tobacco Use  . Smoking status: Never Smoker  . Smokeless tobacco: Never Used  Substance and Sexual Activity  . Alcohol use: Yes    Alcohol/week: 7.0 standard drinks    Types: 7 Glasses of wine per week  . Drug use: No  . Sexual activity: Not on file  Other Topics Concern  . Not on file  Social History Narrative  . Not on file   Social Determinants of Health   Financial Resource Strain:   . Difficulty of Paying Living Expenses: Not on file  Food Insecurity:   . Worried About Charity fundraiser in the Last Year: Not on file  . Ran Out of Food in the Last Year: Not on file  Transportation Needs:   . Lack of Transportation (Medical): Not on file  . Lack of Transportation (Non-Medical): Not on file  Physical Activity:   . Days of Exercise per Week: Not on file  . Minutes of Exercise per Session: Not on file  Stress:   . Feeling of Stress : Not on file  Social Connections:   . Frequency of Communication with Friends and Family: Not on file  . Frequency of Social Gatherings with Friends and Family: Not on file  . Attends Religious Services: Not on file  . Active Member of Clubs or Organizations: Not on file  . Attends Archivist Meetings: Not on file  . Marital Status: Not on file    ECOG Status: 1 -  Symptomatic but completely ambulatory  Review of Systems  Constitutional: Negative.  Respiratory: Positive for shortness of breath.   Cardiovascular: Negative.   Gastrointestinal: Negative.   Genitourinary: Negative.   Musculoskeletal: Negative.   Neurological: Negative.     Review of Systems: A 12 point ROS discussed and pertinent positives are indicated in the HPI above.  All other systems are negative.  Physical Exam No direct physical exam was performed (except for noted visual exam findings with Video Visits).   Vital Signs: There were no vitals taken for this visit.  Imaging: US BIOPSY (KIDNEY)  Result Date: 08/15/2019 INDICATION: History of recurrent adenocarcinoma of the lung with enlarging mass in the left kidney now requiring biopsy for tissue diagnosis. EXAM: ULTRASOUND GUIDED CORE BIOPSY OF LEFT RENAL MASS MEDICATIONS: None. ANESTHESIA/SEDATION: Fentanyl 100 mcg IV; Versed 2.0 mg IV Moderate Sedation Time:  19 minutes. The patient was continuously monitored during the procedure by the interventional radiology nurse under my direct supervision. PROCEDURE: The procedure, risks, benefits, and alternatives were explained to the patient. Questions regarding the procedure were encouraged and answered. The patient understands and consents to the procedure. A time-out was performed prior to initiating the procedure. Ultrasound was performed in a prone position to localize the left kidney and left renal mass. The left flank region was prepped with chlorhexidine in a sterile fashion, and a sterile drape was applied covering the operative field. A sterile gown and sterile gloves were used for the procedure. Local anesthesia was provided with 1% Lidocaine. Under ultrasound guidance, a 17 gauge trocar needle was advanced to the margin of a lesion within the posterior interpolar left kidney. After confirming needle tip position, 3 separate coaxial 18 gauge core biopsy samples were obtained.  Samples were submitted in formalin. The outer needle was removed and additional ultrasound performed. COMPLICATIONS: None immediate. FINDINGS: The lesion within the posterior interpolar renal cortex measures approximately 1.8-1.9 cm in greatest diameter by ultrasound and appears relatively hyperechoic compared to adjacent renal cortex. Solid tissue was obtained. IMPRESSION: Ultrasound-guided core biopsy performed of a posterior interpolar left renal mass measuring approximately 1.8-1.9 cm in greatest diameter by ultrasound. Electronically Signed   By: Aletta Edouard M.D.   On: 08/15/2019 14:47    Labs:  CBC: Recent Labs    03/19/19 1142 04/12/19 1307 06/20/19 0958 08/15/19 0940  WBC 4.0 3.1* 3.2* 3.1*  HGB 11.1* 11.5* 11.2* 12.0  HCT 33.7* 35.5* 34.4* 35.6*  PLT 207 175 164 186    COAGS: Recent Labs    04/12/19 1307 08/15/19 0940  INR 1.0 0.9    BMP: Recent Labs    10/25/18 1324 01/28/19 1157 03/19/19 1142 06/20/19 0958  NA 137 135 132* 136  K 4.2 4.1 4.3 4.0  CL 105 103 99 104  CO2 _0 GLUCOSE 112* 97 98 94  BUN _1 CALCIUM 8.9 8.6* 8.9 8.6*  CREATININE 0.91 0.79 0.80 0.87  GFRNONAA >60 >60 >60 >60  GFRAA >60 >60 >60 >60    LIVER FUNCTION TESTS: Recent Labs    10/25/18 1324 01/28/19 1157 03/19/19 1142 06/20/19 0958  BILITOT 0.4 0.5 0.7 0.6  AST 38 34 28 30  ALT _2 ALKPHOS 66 64 57 56  PROT 6.9 6.5 7.3 6.4*  ALBUMIN 3.7 3.5 3.7 3.5     Assessment and Plan:  I met with Mrs. Tani by [employer]. 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 Given that her chest disease is fairly stable  by imaging with only pleural disease and a recurrent malignant pleural effusion, there would be some benefit in treating the enlarging left renal metastasis with local means such as ablation. The lesion is in a good location and amenable size for percutaneous ablation. I discussed  details of percutaneous ablation with Mrs. Palmieri including risks and need for general anesthesia. Thermal ablation would likely be more definitive in treating a metastasis compared to cryoablation. She would most likely be observed overnight.   After discussion, Mrs. Windholz would like to proceed with ablation of the left renal metastasis. We will begin the scheduling process for the procedure at  Hospital.  Thank you for this interesting consult.  I greatly enjoyed meeting Mysty C Blea and look forward to participating in their care.  A copy of this report was sent to the requesting provider on this date.  Electronically Signed:  T  08/28/2019, 1:26 PM     I spent a total of  30 Minutes  in remote  clinical consultation, greater than 50% of which was counseling/coordinating care for ablation of a left renal mass.    Visit type: Audio and video (My Chart Video).   Alternative for in-person consultation at [facility], [address], [place], [place]. This visit type was conducted due to national recommendations for restrictions regarding the COVID-19 Pandemic (e.g. social distancing).  This format is felt to be most appropriate for this patient at this time.  All issues noted in this document were discussed and addressed.    

## 2019-08-30 ENCOUNTER — Other Ambulatory Visit (HOSPITAL_COMMUNITY): Payer: Self-pay | Admitting: Interventional Radiology

## 2019-08-30 DIAGNOSIS — C7900 Secondary malignant neoplasm of unspecified kidney and renal pelvis: Secondary | ICD-10-CM

## 2019-08-30 DIAGNOSIS — C3490 Malignant neoplasm of unspecified part of unspecified bronchus or lung: Secondary | ICD-10-CM

## 2019-09-02 ENCOUNTER — Ambulatory Visit (INDEPENDENT_AMBULATORY_CARE_PROVIDER_SITE_OTHER): Payer: Self-pay | Admitting: Ophthalmology

## 2019-09-03 ENCOUNTER — Telehealth: Payer: Medicare Other | Admitting: Genetic Counselor

## 2019-09-04 ENCOUNTER — Encounter: Payer: Self-pay | Admitting: Oncology

## 2019-09-06 ENCOUNTER — Encounter: Payer: Self-pay | Admitting: Oncology

## 2019-09-17 ENCOUNTER — Inpatient Hospital Stay: Payer: Medicare Other | Attending: Genetic Counselor | Admitting: Genetic Counselor

## 2019-09-17 ENCOUNTER — Encounter: Payer: Self-pay | Admitting: Genetic Counselor

## 2019-09-17 DIAGNOSIS — Z803 Family history of malignant neoplasm of breast: Secondary | ICD-10-CM | POA: Diagnosis not present

## 2019-09-17 DIAGNOSIS — C3490 Malignant neoplasm of unspecified part of unspecified bronchus or lung: Secondary | ICD-10-CM

## 2019-09-17 DIAGNOSIS — Z808 Family history of malignant neoplasm of other organs or systems: Secondary | ICD-10-CM | POA: Diagnosis not present

## 2019-09-17 DIAGNOSIS — Z8049 Family history of malignant neoplasm of other genital organs: Secondary | ICD-10-CM

## 2019-09-17 DIAGNOSIS — Z800 Family history of malignant neoplasm of digestive organs: Secondary | ICD-10-CM | POA: Insufficient documentation

## 2019-09-17 NOTE — Progress Notes (Signed)
REFERRING PROVIDER: Lloyd Huger, MD [address] [place],  [place] [address]  PRIMARY PROVIDER:  [name], MD  PRIMARY REASON FOR VISIT:  1. Family history of breast cancer   2. Family history of colon cancer   3. Family history of skin cancer   4. Family history of cervical cancer   5. Recurrent non-small cell lung cancer ([place])      I connected with Lauren Page on 09/17/2019 at 11:00 am EDT by MyChart video conference and verified that I am speaking with the correct person using two identifiers.   Patient location: Home Provider location: [facility] office  HISTORY OF PRESENT ILLNESS:   Lauren Page, a 69 y.o. female, was seen for a [place] cancer genetics consultation at the request of Dr. Grayland Ormond due to a family history of breast cancer.  Lauren Page presents to clinic today to discuss the possibility of a hereditary predisposition to cancer, genetic testing, and to further clarify her future cancer risks, as well as potential cancer risks for family members.   In 2015, at the age of 69, Lauren Page was diagnosed with adenocarcinoma of the right lung, which was treated with surgery and chemotherapy. She was noted to have a recurrence confirmed by biopsy on 01/24/17 and subsequently underwent treatment with chemotherapy, radiation, immunotherapy, and Tagrisso.   CANCER HISTORY:  Oncology History Overview Note  # July-AUG 2018- RECURRENT STAGE III Adeno Ca [EBUS; Right paratrach LN [Dr.Ram & Dr.Oaks] insuff tissue for testing; check liquid Bx]; PET- no distant mets.  # AUG 13th 2018- Carbo-Taxol with RT [finish oct 12th 2018]  ------------------------------------------------------------------------------   # MAY 2015- .Marland KitchenA right middle lobe adenocarcinoma s/p resection [Dr.Oaks].  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  may of 2015 4.Patient has finished 4  cycles of chemotherapy on January 23, 2014 Now patient would be randomized   to  our [facility] protocol for Tarceva vs. placebo because of EGFR mutation 3.patient desires to get off protocol because of progressive side effect even after reducing dose.  (May 13, 2014) 5.Patient is starting  maintenance investigationally approach with Tarceva vs. placebo September of 2015. 6.Patient came off protocol therapy because of significant GI side effect May 13, 2014   Patient is nonsmoker;      Primary cancer of right middle lobe of lung ([place])    RISK FACTORS:  Menarche was at age 36-12.  First live birth at age 70.  OCP use for approximately 8 years.  Ovaries intact: yes.  Hysterectomy: yes.  Menopausal status: postmenopausal.  HRT use: since her mid-late 59s (approximately 20 years). Colonoscopy: yes; a few polyps almost every time (not all records available for review). Mammogram within the last year: yes. Number of breast biopsies: 2, followed by breast excision.   Past Medical History:  Diagnosis Date  . Abnormal echocardiogram   . Acid reflux   . Allergic rhinitis   . Anemia    HAD TO HAVE IRON INFUSIONS BACK IN 2015  . Arthritis    bil hands and back  . Asthma   . Bloody discharge from left nipple   . Breast mass    bilateral   . Cancer of right lung ([place]) 2015   lung cancer - chemo, Dr Genevive Bi removed RML Lobectomy.   . Colon polyps   . COPD (chronic obstructive pulmonary disease) ([place])   . Dyspnea  WITH EXERTION  . Family history of breast cancer   . Family history of cervical cancer   . Family history of colon cancer   . Family history of skin cancer   . Hypertension   . Malignant neoplasm of middle lobe of right lung ([place])   . Mitral valve prolapse   . Mitral valve prolapse   . Non-small cell lung cancer ([place])   . Personal history of chemotherapy    F/U Lung Cancer  . Personal history of radiation therapy    Lung cancer  . Primary cancer of right middle  lobe of lung ([place]) 03/11/2016  . Stage IV adenocarcinoma of lung, unspecified laterality [facility])     Past Surgical History:  Procedure Laterality Date  . ABDOMINAL HYSTERECTOMY    . BAND HEMORRHOIDECTOMY    . BREAST BIOPSY Left 03/01/2019   Procedure: LEFT BREAST EXCISIONAL BIOPSY WITH NEEDLE LOCALIZATION;  Surgeon: Herbert Pun, MD;  Location: [facility] ORS;  Service: General;  Laterality: Left;  . BREAST EXCISIONAL BIOPSY Left 1994 (-), 2004 (-)   benign  . BREAST EXCISIONAL BIOPSY Left 03/01/2019   path pending x 2  . COLONOSCOPY  2008    Dr. Donnella Sham  . COLONOSCOPY WITH PROPOFOL N/A 04/12/2019   Procedure: COLONOSCOPY WITH PROPOFOL;  Surgeon: Lollie Sails, MD;  Location[facility] ENDOSCOPY;  Service: Endoscopy;  Laterality: N/A;  . IR RADIOLOGIST EVAL & MGMT  08/28/2019  . KNEE SURGERY  2007  . LUNG LOBECTOMY Right    middle lobe  . PORTACATH PLACEMENT Right 02/09/2017   Procedure: INSERTION PORT-A-CATH;  Surgeon: Nestor Lewandowsky, MD;  Location: [facility] ORS;  Service: General;  Laterality: Right;  . SALIVARY GLAND SURGERY Left 1983   with lymph node removal also  . SALIVARY GLAND SURGERY     salivary gland removal   . UPPER GI ENDOSCOPY  2008  . VIDEO BRONCHOSCOPY WITH ENDOBRONCHIAL ULTRASOUND Right 01/24/2017   Procedure: VIDEO BRONCHOSCOPY WITH ENDOBRONCHIAL ULTRASOUND;  Surgeon: [name], MD;  Location: [facility] ORS;  Service: Pulmonary;  Laterality: Right;    Social History   Socioeconomic History  . Marital status: Married    Spouse name: Not on file  . Number of children: Not on file  . Years of education: Not on file  . Highest education level: Not on file  Occupational History  . Not on file  Tobacco Use  . Smoking status: Never Smoker  . Smokeless tobacco: Never Used  Substance and Sexual Activity  . Alcohol use: Yes    Alcohol/week: 7.0 standard drinks    Types: 7 Glasses of wine per week  . Drug use: No  . Sexual activity: Not on file  Other Topics  Concern  . Not on file  Social History Narrative  . Not on file   Social Determinants of Health   Financial Resource Strain:   . Difficulty of Paying Living Expenses:   Food Insecurity:   . Worried About Charity fundraiser in the Last Year:   . Arboriculturist in the Last Year:   Transportation Needs:   . Film/video editor (Medical):   Marland Kitchen Lack of Transportation (Non-Medical):   Physical Activity:   . Days of Exercise per Week:   . Minutes of Exercise per Session:   Stress:   . Feeling of Stress :   Social Connections:   . Frequency of Communication with Friends and Family:   . Frequency of Social Gatherings with Friends and Family:   .  Attends Religious Services:   . Active Member of Clubs or Organizations:   . Attends Archivist Meetings:   Marland Kitchen Marital Status:      FAMILY HISTORY:  We obtained a detailed, 4-generation family history.  Significant diagnoses are listed below: Family History  Problem Relation Age of Onset  . Breast cancer Maternal Aunt 60  . Alzheimer's disease Maternal Aunt   . Breast cancer Paternal Aunt        dx. in her late 4s  . Breast cancer Maternal Grandmother        dx. in her 74s  . Cervical cancer Maternal Grandmother 60  . Colon cancer Maternal Grandmother        dx. in her 40s  . Breast cancer Maternal Aunt 60  . Alzheimer's disease Maternal Aunt   . Breast cancer Paternal Aunt        dx. in her late 38s  . Heart disease Paternal Aunt   . Healthy Mother   . Healthy Father   . Colon cancer Father 27  . Skin cancer Father   . Heart attack Father   . Heart attack Paternal Grandfather   . Skin cancer Paternal Aunt   . Heart attack Paternal Uncle 74  . Heart attack Paternal Uncle    Lauren Page has one son (age 55) and one granddaughter (age 60). She has a sister who is 12 and may have had skin cancer.   Lauren Page mother died at age 72 and did not have cancer. Lauren Page mother had two maternal half-sisters and two  paternal half-brothers. Both of Lauren Page's maternal aunts had breast cancer diagnosed around the age of 4, as well as Alzheimer's disease. Her maternal grandmother died at age 72 and was diagnosed with cervical cancer at age 63, as well as colon cancer and breast cancer at unknown ages. Her maternal grandfather died in his 65s and had a tremor that she believes may have been due to Parkinsons or another neurological disorder.  Lauren Page father died at age 11 from a heart attack, and had a history of colon cancer diagnosed at age 93 and skin cancer. She had three paternal uncle and three paternal aunts. Two of her aunts developed breast cancer in their late 80s. Additionally, two of her uncles died from heart attacks, one at the age of 83. Lauren Page paternal grandmother died at age 102, and her paternal grandfather died at age 27 from a heart attack.   Lauren Page is unaware of previous family history of genetic testing for hereditary cancer risks. Her maternal ancestors are of unknown descent, and paternal ancestors are of [place] descent. There is no reported Ashkenazi Jewish ancestry. There is no known consanguinity.  GENETIC COUNSELING ASSESSMENT: Lauren Page is a 69 y.o. female with a family history of breast cancer which is somewhat suggestive of a hereditary cancer syndrome and predisposition to cancer. We, therefore, discussed and recommended the following at today's visit.   DISCUSSION: We discussed that 5-10% of breast cancer is hereditary, with most cases associated with the BRCA1 and BRCA2 genes.  There are other genes that can be associated with hereditary breast cancer syndromes.  These include ATM, CHEK2, PALB2, etc.  We discussed that testing is beneficial for several reasons including knowing about other cancer risks, identifying potential screening and risk-reduction options that may be appropriate, and to understand if other family members could be at risk for cancer and allow them to  undergo genetic  testing.   We reviewed the characteristics, features and inheritance patterns of hereditary cancer syndromes. We also discussed genetic testing, including the appropriate family members to test, the process of testing, insurance coverage and turn-around-time for results. We discussed the implications of a negative, positive and/or variant of uncertain significant result. We recommended Lauren Page pursue genetic testing for the [employer] + RNAinsight gene panel.   The CustomNext-Cancer+RNAinsight panel offered by [facility] includes sequencing and rearrangement analysis for up to 91 genes, which will include the following 48 genes for Lauren Page:  APC*, ATM*, AXIN2, BARD1, BMPR1A, BRCA1*, BRCA2*, BRIP1*, CDH1*, CDK4, CDKN2A, CHEK2*, DICER1, EGFR, EPCAM, GREM1, HOXB13, MEN1, MLH1*, MSH2*, MSH3, MSH6*, MUTYH*, NBN, NF1*, NF2, NTHL1, PALB2*, PMS2*, POLD1, POLE, PTEN*, RAD51C*, RAD51D*, RECQL, RET, SDHA, SDHAF2, SDHB, SDHC, SDHD, SMAD4, SMARCA4, STK11, TP53*, TSC1, TSC2, and VHL.  DNA and RNA analyses performed for * genes.   Based on Lauren Page's family history of cancer, she meets NCCN criteria for genetic testing but does not meet Medicare criteria for genetic testing. We discussed the option to have testing done through [facility], which will waive the cost of genetic testing for such patients who are insured by Medicare. Lauren Page is comfortable with this plan.  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 DES exposure before birth has been linked to a higher risk of having abnormal cells in the cervix and vagina, although it is not clear how much the risk is increased.  PLAN: After considering the risks, benefits, and limitations, Lauren Page. [place] provided informed consent to pursue genetic testing and the blood sample will be sent to [employer] [facility] for  analysis of the CustomNext-Cancer Panel + RNAinsight. Results should be available within approximately two-three weeks' time, at which point they will be disclosed by telephone to Lauren Page. [place], as will any additional recommendations warranted by these results. Lauren Page. [place] will receive a summary of her genetic counseling visit and a copy of her results once available. This information will also be available in Epic.   Lauren Page. [place] questions were answered to her satisfaction today. Our contact information was provided should additional questions or concerns arise. Thank you for the referral and allowing [place] to share in the care of your patient.   Clint Guy, Lauren Page, [facility] Genetic Counselor [email or web address] Phone: [phone]  The patient was seen for a total of 40 minutes in face-to-face genetic counseling.  This patient was discussed with Drs. Magrinat, Lindi Adie and/or Burr Medico who agrees with the above.    _______________________________________________________________________ For Office Staff:  Number of people involved in session: 1 Was an Intern/ student involved with case: no

## 2019-09-19 ENCOUNTER — Ambulatory Visit: Payer: Medicare Other

## 2019-09-19 ENCOUNTER — Other Ambulatory Visit: Payer: Self-pay

## 2019-09-19 ENCOUNTER — Inpatient Hospital Stay: Payer: Medicare Other | Attending: Oncology | Admitting: *Deleted

## 2019-09-19 ENCOUNTER — Encounter: Payer: Self-pay | Admitting: Radiation Oncology

## 2019-09-19 ENCOUNTER — Ambulatory Visit
Admission: RE | Admit: 2019-09-19 | Discharge: 2019-09-19 | Disposition: A | Discharge status: Home / Self Care | Payer: Medicare Other | Source: Ambulatory Visit | Attending: Radiation Oncology | Admitting: Radiation Oncology

## 2019-09-19 DIAGNOSIS — C342 Malignant neoplasm of middle lobe, bronchus or lung: Secondary | ICD-10-CM

## 2019-09-19 DIAGNOSIS — Z452 Encounter for adjustment and management of vascular access device: Secondary | ICD-10-CM | POA: Diagnosis not present

## 2019-09-19 DIAGNOSIS — Z923 Personal history of irradiation: Secondary | ICD-10-CM | POA: Diagnosis not present

## 2019-09-19 DIAGNOSIS — Z95828 Presence of other vascular implants and grafts: Secondary | ICD-10-CM

## 2019-09-19 DIAGNOSIS — C7902 Secondary malignant neoplasm of left kidney and renal pelvis: Secondary | ICD-10-CM | POA: Insufficient documentation

## 2019-09-19 MED ORDER — HEPARIN SOD (PORK) LOCK FLUSH 100 UNIT/ML IV SOLN
500.0000 [IU] | Freq: Once | INTRAVENOUS | Status: AC
  Administered 2019-09-19: 500 [IU] via INTRAVENOUS
  Filled 2019-09-19: qty 5

## 2019-09-19 MED ORDER — SODIUM CHLORIDE 0.9% FLUSH
10.0000 mL | Freq: Once | INTRAVENOUS | Status: AC
  Administered 2019-09-19: 10 mL via INTRAVENOUS
  Filled 2019-09-19: qty 10

## 2019-09-19 NOTE — Progress Notes (Signed)
Radiation Oncology Follow up Note  Name: Lauren Page   Date:   09/19/2019 MRN:  [id number] DOB: 01-21-1951    This 69 y.o. 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  She is scheduled to undergo ablation by interventional radiology for that.  She is currently on Tagrisso a third-generation epidermal growth factor receptor Tyra kinase inhibitor which she is tolerating well except for some cracked fingernails.  She specifically denies any significant cough hemoptysis or chest tightness.  Her PET scan back in January which I have reviewed shows pleural fluid with dependent nodularity showing mild FDG uptake suspicious for metastatic disease.  Also noted was a lesion in the posterior left kidney.  Right hilum shows some mild FDG uptake which I consider radiation changes.  COMPLICATIONS OF TREATMENT: none  FOLLOW UP COMPLIANCE: keeps appointments   PHYSICAL EXAM:  BP (!) (P) 135/96 (BP Location: Left Arm, Patient Position: Sitting)   Pulse (P) 87   Temp (!) (P) 97.2 F (36.2 C) (Tympanic)   Resp (P) 18   Wt (P) 137 lb (62.1 kg)   BMI (P) 24.27 kg/m  Well-developed well-nourished patient in NAD. HEENT reveals PERLA, EOMI, discs not visualized.  Oral cavity is clear. No oral mucosal lesions are identified. Neck is clear without evidence of cervical or supraclavicular adenopathy. Lungs are clear to A&P. Cardiac examination is essentially unremarkable with regular rate and rhythm without murmur rub or thrill. Abdomen is benign with no organomegaly or masses noted. Motor sensory  and DTR levels are equal and symmetric in the upper and lower extremities. Cranial nerves II through XII are grossly intact. Proprioception is intact. No peripheral adenopathy or edema is identified. No motor or sensory levels are noted. Crude visual fields are within normal range.  RADIOLOGY RESULTS: CT scans of abdomen as well as chest and PET scan all reviewed compatible with above-stated findings  PLAN: Present time patient is under treatment for stage IV adenocarcinoma of the lung with biopsy-proven renal involvement.  I see no role for palliative radiation therapy at this time.  I have asked to see her back in 6 months for follow-up.  I be happy to reevaluate the patient in time should further palliative treatment be indicated.  I would like to take this opportunity to thank you for allowing me to participate in the care of your patient.Noreene Filbert, MD

## 2019-09-23 NOTE — Progress Notes (Signed)
Rquested orders to be put into Epic for procedure on 10/02/19.  [facility] at [phone]- Intervention Radiology PA line.

## 2019-09-26 NOTE — Patient Instructions (Signed)
DUE TO COVID-19 ONLY ONE VISITOR IS ALLOWED TO COME WITH YOU AND STAY IN THE WAITING ROOM ONLY DURING PRE OP AND PROCEDURE DAY OF SURGERY. THE 1 VISITOR MAY VISIT WITH YOU AFTER SURGERY IN YOUR PRIVATE ROOM DURING VISITING HOURS ONLY!  YOU NEED TO HAVE A COVID 19 TEST ON_3/29/21______ @_10 :25______, THIS TEST MUST BE DONE BEFORE SURGERY, COME  [place], [place] , [address].  ([place]) ONCE YOUR COVID TEST IS COMPLETED, PLEASE BEGIN THE QUARANTINE INSTRUCTIONS AS OUTLINED IN YOUR HANDOUT.                GISELLE BRUTUS    Your procedure is scheduled on: 10/02/18   Report to [facility] Main  Entrance   Report to Radiology at 8:15 AM     Call this number if you have problems the morning of surgery [phone]    Remember: Do not eat food or drink liquids :After Midnight.   BRUSH YOUR TEETH MORNING OF SURGERY AND RINSE YOUR MOUTH OUT, NO CHEWING GUM CANDY OR MINTS.     Take these medicines the morning of surgery with A SIP OF WATER: Cydney Ok, Flonase, use inhalers if needed and bring them with you to the hospital                                 You may not have any metal on your body including hair pins and              piercings  Do not wear jewelry, make-up, lotions, powders or perfumes, deodorant             Do not wear nail polish on your fingernails.  Do not shave  48 hours prior to surgery.     Do not bring valuables to the hospital. [place].  Contacts, dentures or bridgework may not be worn into surgery.       Name and phone number of your driver:  Special Instructions: N/A              Please read over the following fact sheets you were given: _____________________________________________________________________             [facility] - Preparing for Surgery  Before surgery, you can play an important role.   Because skin is not sterile, your skin needs to be as free  of germs as possible.   You can reduce the number of germs on your skin by washing with CHG (chlorahexidine gluconate) soap before surgery.   CHG is an antiseptic cleaner which kills germs and bonds with the skin to continue killing germs even after washing. Please DO NOT use if you have an allergy to CHG or antibacterial soaps.   If your skin becomes reddened/irritated stop using the CHG and inform your nurse when you arrive at Short Stay. Do not shave (including legs and underarms) for at least 48 hours prior to the first CHG shower.   Please follow these instructions carefully:  1.  Shower with CHG Soap the night before surgery and the  morning of Surgery.  2.  If you choose to wash your hair, wash your hair first as usual with your  normal  shampoo.  3.  After you shampoo, rinse your hair and body  thoroughly to remove the  shampoo.                                        4.  Use CHG as you would any other liquid soap.  You can apply chg directly  to the skin and wash                       Gently with a scrungie or clean washcloth.  5.  Apply the CHG Soap to your body ONLY FROM THE NECK DOWN.   Do not use on face/ open                           Wound or open sores. Avoid contact with eyes, ears mouth and genitals (private parts).                       Wash face,  Genitals (private parts) with your normal soap.             6.  Wash thoroughly, paying special attention to the area where your surgery  will be performed.  7.  Thoroughly rinse your body with warm water from the neck down.  8.  DO NOT shower/wash with your normal soap after using and rinsing off  the CHG Soap.             9.  Pat yourself dry with a clean towel.            10.  Wear clean pajamas.            11.  Place clean sheets on your bed the night of your first shower and do not  sleep with pets. Day of Surgery : Do not apply any lotions/deodorants the morning of surgery.  Please wear clean clothes to the hospital/surgery  center.  FAILURE TO FOLLOW THESE INSTRUCTIONS MAY RESULT IN THE CANCELLATION OF YOUR SURGERY PATIENT SIGNATURE_________________________________  NURSE SIGNATURE__________________________________  ________________________________________________________________________

## 2019-09-27 ENCOUNTER — Encounter (HOSPITAL_COMMUNITY): Payer: Self-pay

## 2019-09-27 ENCOUNTER — Telehealth: Payer: Self-pay

## 2019-09-27 ENCOUNTER — Encounter (HOSPITAL_COMMUNITY)
Admission: RE | Admit: 2019-09-27 | Discharge: 2019-09-27 | Disposition: A | Discharge status: Home / Self Care | Payer: Medicare Other | Source: Ambulatory Visit | Attending: Interventional Radiology | Admitting: Interventional Radiology

## 2019-09-27 DIAGNOSIS — Z01818 Encounter for other preprocedural examination: Secondary | ICD-10-CM | POA: Insufficient documentation

## 2019-09-27 NOTE — Progress Notes (Signed)
PCP - Dr. Earnest Bailey Cardiologist - none  Chest x-ray - 07/26/19 EKG - 02/22/19 Stress Test - no ECHO - no Cardiac Cath -no   Sleep Study - no CPAP -   Fasting Blood Sugar - NA Checks Blood Sugar _____ times a day  Blood Thinner Instructions:NA Aspirin Instructions: Last Dose:  Anesthesia review:   Patient denies shortness of breath, fever, cough and chest pain at PAT appointment yes  Patient verbalized understanding of instructions that were given to them at the PAT appointment. Patient was also instructed that they will need to review over the PAT instructions again at home before surgery. Yes Pt reports 2 spots on Rt arm that are suspicious for MRSA. She has called her PCP. Anesthesia PA was notified and Tiffany in IR was called to relay info to Dr. Kathlene Cote.

## 2019-09-27 NOTE — Telephone Encounter (Signed)
Patient left a message this morning asking for a RF of a antibiotic that Dr. Nehemiah Massed prescribed back in 2019 for a MRSA infection. She has developed new places that came up yesterday. She called to ask for a RF of the medication because she is having a procedure done for a ablation on a place in her stomach next week. I explained to patient that we have not seen her since 10/2018 and our computer issue in July. Patient was unhappy with information I was giving her. I also advised patient that Dr. Nehemiah Massed does not work on Fridays and typically we need to see a patient before we treat a possible infection. Patient states between her and her husbands oncology appointments she can not come in today. I told her I would send the msg to you and she states if you say no then there is nothing she can do.

## 2019-09-27 NOTE — Telephone Encounter (Signed)
Spoke with patient regarding information per Dr. Nicole Kindred. She states she will just have to call her PCP to see if they can prescribe something for her before the procedure as she can not come in to the office.

## 2019-09-27 NOTE — Telephone Encounter (Signed)
I am happy to see her if she can come in today.  Since she is not my patient and I don't know what I'm treating, I cannot send in an antibiotic until I see her.  Dr. Raliegh Ip is in on Monday and we can work her in then as well.  Or she can call the doctor that is doing the procedure and let them know she might need an antibiotic prior to the procedure.

## 2019-09-30 ENCOUNTER — Other Ambulatory Visit: Payer: Self-pay | Admitting: Radiology

## 2019-09-30 ENCOUNTER — Other Ambulatory Visit
Admission: RE | Admit: 2019-09-30 | Discharge: 2019-09-30 | Disposition: A | Discharge status: Home / Self Care | Payer: Medicare Other | Source: Ambulatory Visit | Attending: Interventional Radiology | Admitting: Interventional Radiology

## 2019-09-30 DIAGNOSIS — Z20822 Contact with and (suspected) exposure to covid-19: Secondary | ICD-10-CM | POA: Insufficient documentation

## 2019-09-30 DIAGNOSIS — Z01812 Encounter for preprocedural laboratory examination: Secondary | ICD-10-CM | POA: Insufficient documentation

## 2019-09-30 LAB — SARS CORONAVIRUS 2 (TAT 6-24 HRS): SARS Coronavirus 2: NEGATIVE

## 2019-10-01 NOTE — Progress Notes (Signed)
Patient was scheduled to have labs done on 09/21/19 at [facility].  Patient went to [facility] at 1030am on 3/29 per patient.  No orders in epic for labs to be done.  Oders no placed in epic until 1448pm.  Spoke with patient via phone.  Spoke with Roanna Banning to see if labs needed to be done todaay( 3/30 at [facility] or labs could be done am of procedure.  Patient stated she called office of Dr Kathlene Cote and stated she was told labs could be done am of procedure. Patient called and notified labs would be done upon arrival at [facility] on 10/02/19.  Patient voiced understanding

## 2019-10-02 ENCOUNTER — Observation Stay (HOSPITAL_COMMUNITY)
Admission: RE | Admit: 2019-10-02 | Discharge: 2019-10-03 | Disposition: A | Discharge status: Home / Self Care | Payer: Medicare Other | Source: Ambulatory Visit | Attending: Interventional Radiology | Admitting: Interventional Radiology

## 2019-10-02 ENCOUNTER — Ambulatory Visit (HOSPITAL_COMMUNITY)
Admission: RE | Admit: 2019-10-02 | Discharge: 2019-10-02 | Disposition: A | Discharge status: Home / Self Care | Payer: Medicare Other | Source: Ambulatory Visit | Attending: Interventional Radiology | Admitting: Interventional Radiology

## 2019-10-02 ENCOUNTER — Encounter (HOSPITAL_COMMUNITY)
Admission: RE | Disposition: A | Discharge status: Home / Self Care | Payer: Self-pay | Source: Ambulatory Visit | Attending: Interventional Radiology

## 2019-10-02 ENCOUNTER — Ambulatory Visit (HOSPITAL_COMMUNITY): Payer: Medicare Other | Admitting: Physician Assistant

## 2019-10-02 ENCOUNTER — Encounter (HOSPITAL_COMMUNITY): Payer: Self-pay | Admitting: Interventional Radiology

## 2019-10-02 ENCOUNTER — Ambulatory Visit (HOSPITAL_COMMUNITY): Payer: Medicare Other

## 2019-10-02 ENCOUNTER — Other Ambulatory Visit: Payer: Self-pay

## 2019-10-02 ENCOUNTER — Ambulatory Visit (HOSPITAL_COMMUNITY): Payer: Medicare Other | Admitting: Anesthesiology

## 2019-10-02 DIAGNOSIS — Z85118 Personal history of other malignant neoplasm of bronchus and lung: Secondary | ICD-10-CM | POA: Diagnosis not present

## 2019-10-02 DIAGNOSIS — Z01818 Encounter for other preprocedural examination: Secondary | ICD-10-CM

## 2019-10-02 DIAGNOSIS — Z791 Long term (current) use of non-steroidal anti-inflammatories (NSAID): Secondary | ICD-10-CM | POA: Diagnosis not present

## 2019-10-02 DIAGNOSIS — N2889 Other specified disorders of kidney and ureter: Secondary | ICD-10-CM | POA: Diagnosis present

## 2019-10-02 DIAGNOSIS — J449 Chronic obstructive pulmonary disease, unspecified: Secondary | ICD-10-CM | POA: Insufficient documentation

## 2019-10-02 DIAGNOSIS — C7902 Secondary malignant neoplasm of left kidney and renal pelvis: Principal | ICD-10-CM | POA: Insufficient documentation

## 2019-10-02 DIAGNOSIS — I10 Essential (primary) hypertension: Secondary | ICD-10-CM | POA: Diagnosis not present

## 2019-10-02 DIAGNOSIS — Z9221 Personal history of antineoplastic chemotherapy: Secondary | ICD-10-CM | POA: Diagnosis not present

## 2019-10-02 DIAGNOSIS — Z7951 Long term (current) use of inhaled steroids: Secondary | ICD-10-CM | POA: Diagnosis not present

## 2019-10-02 DIAGNOSIS — Z20822 Contact with and (suspected) exposure to covid-19: Secondary | ICD-10-CM | POA: Diagnosis not present

## 2019-10-02 DIAGNOSIS — Z902 Acquired absence of lung [part of]: Secondary | ICD-10-CM | POA: Insufficient documentation

## 2019-10-02 DIAGNOSIS — M1990 Unspecified osteoarthritis, unspecified site: Secondary | ICD-10-CM | POA: Insufficient documentation

## 2019-10-02 DIAGNOSIS — Z79899 Other long term (current) drug therapy: Secondary | ICD-10-CM | POA: Diagnosis not present

## 2019-10-02 DIAGNOSIS — C7900 Secondary malignant neoplasm of unspecified kidney and renal pelvis: Secondary | ICD-10-CM

## 2019-10-02 DIAGNOSIS — K219 Gastro-esophageal reflux disease without esophagitis: Secondary | ICD-10-CM | POA: Diagnosis not present

## 2019-10-02 DIAGNOSIS — C3490 Malignant neoplasm of unspecified part of unspecified bronchus or lung: Secondary | ICD-10-CM

## 2019-10-02 DIAGNOSIS — R9431 Abnormal electrocardiogram [ECG] [EKG]: Secondary | ICD-10-CM | POA: Diagnosis not present

## 2019-10-02 HISTORY — PX: RADIOLOGY WITH ANESTHESIA: SHX6223

## 2019-10-02 LAB — CBC WITH DIFFERENTIAL/PLATELET
Abs Immature Granulocytes: 0.02 10*3/uL (ref 0.00–0.07)
Basophils Absolute: 0 10*3/uL (ref 0.0–0.1)
Basophils Relative: 1 %
Eosinophils Absolute: 0.2 10*3/uL (ref 0.0–0.5)
Eosinophils Relative: 4 %
HCT: 36 % (ref 36.0–46.0)
Hemoglobin: 11.8 g/dL — ABNORMAL LOW (ref 12.0–15.0)
Immature Granulocytes: 1 %
Lymphocytes Relative: 19 %
Lymphs Abs: 0.8 10*3/uL (ref 0.7–4.0)
MCH: 30 pg (ref 26.0–34.0)
MCHC: 32.8 g/dL (ref 30.0–36.0)
MCV: 91.6 fL (ref 80.0–100.0)
Monocytes Absolute: 0.5 10*3/uL (ref 0.1–1.0)
Monocytes Relative: 11 %
Neutro Abs: 2.6 10*3/uL (ref 1.7–7.7)
Neutrophils Relative %: 64 %
Platelets: 222 10*3/uL (ref 150–400)
RBC: 3.93 MIL/uL (ref 3.87–5.11)
RDW: 14.8 % (ref 11.5–15.5)
WBC: 4.1 10*3/uL (ref 4.0–10.5)
nRBC: 0 % (ref 0.0–0.2)

## 2019-10-02 LAB — TYPE AND SCREEN
ABO/RH(D): O POS
Antibody Screen: NEGATIVE

## 2019-10-02 LAB — BASIC METABOLIC PANEL
Anion gap: 9 (ref 5–15)
BUN: 16 mg/dL (ref 8–23)
CO2: 24 mmol/L (ref 22–32)
Calcium: 9.1 mg/dL (ref 8.9–10.3)
Chloride: 106 mmol/L (ref 98–111)
Creatinine, Ser: 0.9 mg/dL (ref 0.44–1.00)
GFR calc Af Amer: >60 mL/min (ref >60)
GFR calc non Af Amer: >60 mL/min (ref >60)
Glucose, Bld: 98 mg/dL (ref 70–99)
Potassium: 3.8 mmol/L (ref 3.5–5.1)
Sodium: 139 mmol/L (ref 135–145)

## 2019-10-02 LAB — ABO/RH: ABO/RH(D): O POS

## 2019-10-02 LAB — PROTIME-INR
INR: 1 (ref 0.8–1.2)
Prothrombin Time: 13.2 seconds (ref 11.4–15.2)

## 2019-10-02 SURGERY — RADIOLOGY WITH ANESTHESIA
Anesthesia: General

## 2019-10-02 MED ORDER — FENTANYL CITRATE (PF) 100 MCG/2ML IJ SOLN
INTRAMUSCULAR | Status: AC
  Filled 2019-10-02: qty 2

## 2019-10-02 MED ORDER — PHENYLEPHRINE HCL (PRESSORS) 10 MG/ML IV SOLN
INTRAVENOUS | Status: DC | PRN
  Administered 2019-10-02 (×2): 120 ug via INTRAVENOUS
  Administered 2019-10-02: 80 ug via INTRAVENOUS

## 2019-10-02 MED ORDER — FENTANYL CITRATE (PF) 250 MCG/5ML IJ SOLN
INTRAMUSCULAR | Status: AC
  Filled 2019-10-02: qty 5

## 2019-10-02 MED ORDER — PROPOFOL 10 MG/ML IV BOLUS
INTRAVENOUS | Status: DC | PRN
  Administered 2019-10-02: 150 mg via INTRAVENOUS

## 2019-10-02 MED ORDER — PANTOPRAZOLE SODIUM 20 MG PO TBEC
20.0000 mg | DELAYED_RELEASE_TABLET | Freq: Every day | ORAL | Status: DC
  Filled 2019-10-02: qty 1

## 2019-10-02 MED ORDER — DOCUSATE SODIUM 100 MG PO CAPS
100.0000 mg | ORAL_CAPSULE | Freq: Two times a day (BID) | ORAL | Status: DC
  Filled 2019-10-02 (×2): qty 1

## 2019-10-02 MED ORDER — PROPOFOL 10 MG/ML IV BOLUS
INTRAVENOUS | Status: AC
  Filled 2019-10-02: qty 20

## 2019-10-02 MED ORDER — LOSARTAN POTASSIUM 50 MG PO TABS
25.0000 mg | ORAL_TABLET | Freq: Every day | ORAL | Status: DC
  Filled 2019-10-02: qty 1

## 2019-10-02 MED ORDER — LACTATED RINGERS IV SOLN: INTRAVENOUS | Status: DC

## 2019-10-02 MED ORDER — ESTROGENS CONJUGATED 1.25 MG PO TABS
1.2500 mg | ORAL_TABLET | Freq: Every day | ORAL | Status: DC
  Filled 2019-10-02: qty 1

## 2019-10-02 MED ORDER — HYDROMORPHONE HCL 1 MG/ML IJ SOLN
0.5000 mg | INTRAMUSCULAR | Status: DC | PRN
  Administered 2019-10-02: 0.5 mg via INTRAVENOUS
  Filled 2019-10-02 (×2): qty 0.5

## 2019-10-02 MED ORDER — OXYCODONE HCL 5 MG/5ML PO SOLN: 5.0000 mg | Freq: Once | ORAL | Status: DC | PRN

## 2019-10-02 MED ORDER — VANCOMYCIN HCL IN DEXTROSE 1-5 GM/200ML-% IV SOLN
1000.0000 mg | INTRAVENOUS | Status: AC
  Administered 2019-10-02: 1000 mg via INTRAVENOUS
  Filled 2019-10-02: qty 200

## 2019-10-02 MED ORDER — FENTANYL CITRATE (PF) 100 MCG/2ML IJ SOLN
25.0000 ug | INTRAMUSCULAR | Status: DC | PRN
  Administered 2019-10-02: 50 ug via INTRAVENOUS

## 2019-10-02 MED ORDER — DEXAMETHASONE SODIUM PHOSPHATE 10 MG/ML IJ SOLN
INTRAMUSCULAR | Status: AC
  Filled 2019-10-02: qty 1

## 2019-10-02 MED ORDER — SENNOSIDES-DOCUSATE SODIUM 8.6-50 MG PO TABS: 1.0000 | ORAL_TABLET | Freq: Every day | ORAL | Status: DC | PRN

## 2019-10-02 MED ORDER — FENTANYL CITRATE (PF) 100 MCG/2ML IJ SOLN: 25.0000 ug | INTRAMUSCULAR | Status: DC | PRN

## 2019-10-02 MED ORDER — DEXAMETHASONE SODIUM PHOSPHATE 10 MG/ML IJ SOLN
INTRAMUSCULAR | Status: DC | PRN
  Administered 2019-10-02: 8 mg via INTRAVENOUS

## 2019-10-02 MED ORDER — SODIUM CHLORIDE (PF) 0.9 % IJ SOLN
INTRAMUSCULAR | Status: AC
  Filled 2019-10-02: qty 100

## 2019-10-02 MED ORDER — ONDANSETRON HCL 4 MG/2ML IJ SOLN: 4.0000 mg | Freq: Once | INTRAMUSCULAR | Status: DC | PRN

## 2019-10-02 MED ORDER — ROCURONIUM BROMIDE 100 MG/10ML IV SOLN
INTRAVENOUS | Status: DC | PRN
  Administered 2019-10-02 (×2): 20 mg via INTRAVENOUS
  Administered 2019-10-02: 50 mg via INTRAVENOUS

## 2019-10-02 MED ORDER — MIDAZOLAM HCL 5 MG/5ML IJ SOLN
INTRAMUSCULAR | Status: DC | PRN
  Administered 2019-10-02: 2 mg via INTRAVENOUS

## 2019-10-02 MED ORDER — ACETAMINOPHEN 325 MG PO TABS: 325.0000 mg | ORAL_TABLET | ORAL | Status: DC | PRN

## 2019-10-02 MED ORDER — ONDANSETRON HCL 4 MG/2ML IJ SOLN
INTRAMUSCULAR | Status: DC | PRN
  Administered 2019-10-02: 4 mg via INTRAVENOUS

## 2019-10-02 MED ORDER — ACETAMINOPHEN 160 MG/5ML PO SOLN: 325.0000 mg | ORAL | Status: DC | PRN

## 2019-10-02 MED ORDER — PROCHLORPERAZINE MALEATE 10 MG PO TABS: 10.0000 mg | ORAL_TABLET | Freq: Four times a day (QID) | ORAL | Status: DC | PRN

## 2019-10-02 MED ORDER — LIDOCAINE 2% (20 MG/ML) 5 ML SYRINGE
INTRAMUSCULAR | Status: AC
  Filled 2019-10-02: qty 5

## 2019-10-02 MED ORDER — POLYETHYLENE GLYCOL 3350 17 G PO PACK: 17.0000 g | PACK | Freq: Every day | ORAL | Status: DC | PRN

## 2019-10-02 MED ORDER — MIDAZOLAM HCL 2 MG/2ML IJ SOLN
INTRAMUSCULAR | Status: AC
  Filled 2019-10-02: qty 2

## 2019-10-02 MED ORDER — OXYCODONE HCL 5 MG PO TABS: 5.0000 mg | ORAL_TABLET | Freq: Once | ORAL | Status: DC | PRN

## 2019-10-02 MED ORDER — MEPERIDINE HCL 50 MG/ML IJ SOLN: 6.2500 mg | INTRAMUSCULAR | Status: DC | PRN

## 2019-10-02 MED ORDER — LIDOCAINE HCL (CARDIAC) PF 100 MG/5ML IV SOSY
PREFILLED_SYRINGE | INTRAVENOUS | Status: DC | PRN
  Administered 2019-10-02: 80 mg via INTRAVENOUS

## 2019-10-02 MED ORDER — EPHEDRINE SULFATE 50 MG/ML IJ SOLN
INTRAMUSCULAR | Status: DC | PRN
  Administered 2019-10-02 (×3): 10 mg via INTRAVENOUS

## 2019-10-02 MED ORDER — SODIUM CHLORIDE 0.9 % IV SOLN: INTRAVENOUS | Status: DC

## 2019-10-02 MED ORDER — FENTANYL CITRATE (PF) 250 MCG/5ML IJ SOLN
INTRAMUSCULAR | Status: DC | PRN
  Administered 2019-10-02 (×2): 50 ug via INTRAVENOUS
  Administered 2019-10-02: 100 ug via INTRAVENOUS

## 2019-10-02 MED ORDER — HYDROCODONE-ACETAMINOPHEN 5-325 MG PO TABS
1.0000 | ORAL_TABLET | ORAL | Status: DC | PRN
  Filled 2019-10-02: qty 1

## 2019-10-02 MED ORDER — SODIUM CHLORIDE 0.9 % IV SOLN
INTRAVENOUS | Status: AC
  Filled 2019-10-02: qty 250

## 2019-10-02 MED ORDER — IOHEXOL 350 MG/ML SOLN: 100.0000 mL | Freq: Once | INTRAVENOUS | Status: DC | PRN

## 2019-10-02 MED ORDER — ONDANSETRON HCL 4 MG/2ML IJ SOLN: 4.0000 mg | Freq: Four times a day (QID) | INTRAMUSCULAR | Status: DC | PRN

## 2019-10-02 MED ORDER — ROCURONIUM BROMIDE 10 MG/ML (PF) SYRINGE
PREFILLED_SYRINGE | INTRAVENOUS | Status: AC
  Filled 2019-10-02: qty 10

## 2019-10-02 MED ORDER — SUGAMMADEX SODIUM 500 MG/5ML IV SOLN
INTRAVENOUS | Status: DC | PRN
  Administered 2019-10-02: 200 mg via INTRAVENOUS

## 2019-10-02 MED ORDER — ALBUTEROL SULFATE (2.5 MG/3ML) 0.083% IN NEBU: 3.0000 mL | INHALATION_SOLUTION | Freq: Four times a day (QID) | RESPIRATORY_TRACT | Status: DC | PRN

## 2019-10-02 NOTE — Transfer of Care (Signed)
Immediate Anesthesia Transfer of Care Note  Patient: Lauren Page  Procedure(s) Performed: MICROWAVE [place] (N/A )  Patient Location: PACU  Anesthesia Type:General  Level of Consciousness: drowsy, patient cooperative and responds to stimulation  Airway & Oxygen Therapy: Patient Spontanous Breathing and Patient connected to face mask oxygen  Post-op Assessment: Report given to RN and Post -op Vital signs reviewed and stable  Post vital signs: Reviewed and stable  Last Vitals:  Vitals Value Taken Time  BP 127/67 10/02/19 1522  Temp 36.4 C 10/02/19 1522  Pulse 78 10/02/19 1524  Resp 16 10/02/19 1524  SpO2 100 % 10/02/19 1524  Vitals shown include unvalidated device data.  Last Pain: There were no vitals filed for this visit.       Complications: No apparent anesthesia complications

## 2019-10-02 NOTE — Anesthesia Procedure Notes (Signed)
Procedure Name: Intubation Date/Time: 10/02/2019 1:17 PM Performed by: Glory Buff, CRNA Pre-anesthesia Checklist: Patient identified, Emergency Drugs available, Suction available and Patient being monitored Patient Re-evaluated:Patient Re-evaluated prior to induction Oxygen Delivery Method: Circle system utilized Preoxygenation: Pre-oxygenation with 100% oxygen Induction Type: IV induction Ventilation: Mask ventilation without difficulty Laryngoscope Size: Miller and 3 Grade View: Grade I Tube type: Oral Tube size: 7.0 mm Number of attempts: 1 Airway Equipment and Method: Stylet and Oral airway Placement Confirmation: ETT inserted through vocal cords under direct vision,  positive ETCO2 and breath sounds checked- equal and bilateral Secured at: 22 cm Tube secured with: Tape Dental Injury: Teeth and Oropharynx as per pre-operative assessment

## 2019-10-02 NOTE — Anesthesia Postprocedure Evaluation (Signed)
Anesthesia Post Note  Patient: Lauren Page  Procedure(s) Performed: MICROWAVE [place] (N/A )     Patient location during evaluation: PACU Anesthesia Type: General Level of consciousness: awake and alert Pain management: pain level controlled Vital Signs Assessment: post-procedure vital signs reviewed and stable Respiratory status: spontaneous breathing, nonlabored ventilation, respiratory function stable and patient connected to nasal cannula oxygen Cardiovascular status: blood pressure returned to baseline and stable Postop Assessment: no apparent nausea or vomiting Anesthetic complications: no    Last Vitals:  Vitals:   10/02/19 1522 10/02/19 1530  BP: 127/67 124/70  Pulse: 80 82  Resp: 17 19  Temp: 36.4 C   SpO2: 100% 99%    Last Pain:  Vitals:   10/02/19 1530  PainSc: 4                  Barnet Glasgow

## 2019-10-02 NOTE — Procedures (Signed)
Interventional Radiology Procedure Note  Procedure: CT and US guided thermal ablation of left renal metastasis  Complications: None  Estimated Blood Loss: < 10 mL  Findings: By [place], left renal metastatic lesion now about 2.5 cm in greatest diameter. Hydrodissection performed between kidney and descending colon with 200 mL of diluted sterile saline mixed with contrast. Microwave thermal ablation performed of mass using NeuWave XT probe for 6 min at 65 watts.  Plan: PACU recovery followed by overnight observation.  Venetia Night. Kathlene Cote, M.D Pager:  [phone]

## 2019-10-02 NOTE — H&P (Signed)
Referring Physician(s): Finnegan,T  Supervising Physician: Aletta Edouard  Patient Status:  [facility] OP TBA  Chief Complaint: Metastatic lung cancer to left kidney   Subjective: Patient familiar to IR service from right lung mass biopsy in 2015, right thoracenteses on 09/22/2017 and 07/26/2019, and left renal mass biopsy on 08/15/2019.  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  She underwent tele consultation with Dr. Kathlene Cote on 08/28/2019 to discuss treatment options and was deemed an appropriate candidate for CT-guided thermal ablation of the left renal metastasis. She presents today for the procedure.  She currently denies fever, chest pain, abdominal/back pain, nausea, vomiting or bleeding.  She does have occasional headaches, some dyspnea with exertion and occasional cough.  Past Medical History:  Diagnosis Date  . Abnormal echocardiogram   . Acid reflux   . Allergic rhinitis   . Anemia    HAD TO HAVE IRON INFUSIONS BACK IN 2015  . Arthritis    bil hands and back  . Asthma   . Bloody discharge from left nipple   . Breast mass    bilateral   . Cancer of right lung ([place]) 2015   lung cancer - chemo, Dr Genevive Bi removed RML Lobectomy.   . Colon polyps   . COPD (chronic obstructive pulmonary disease) ([place])   . Dyspnea    WITH EXERTION  . Family history of breast cancer   . Family history of cervical cancer   . Family history of colon cancer   . Family history of skin cancer   . Hypertension   . Malignant neoplasm of middle lobe of right lung ([place])   . Mitral valve prolapse   . Mitral valve prolapse   . Non-small cell lung cancer ([place])   . Personal history of chemotherapy    F/U Lung Cancer  . Personal history of radiation therapy    Lung cancer  . Primary cancer of right middle lobe of lung ([place]) 03/11/2016  . Stage IV adenocarcinoma of lung, unspecified laterality [facility])    Past Surgical History:  Procedure  Laterality Date  . ABDOMINAL HYSTERECTOMY    . BAND HEMORRHOIDECTOMY    . BREAST BIOPSY Left 03/01/2019   Procedure: LEFT BREAST EXCISIONAL BIOPSY WITH NEEDLE LOCALIZATION;  Surgeon: Herbert Pun, MD;  Location: [facility] ORS;  Service: General;  Laterality: Left;  . BREAST EXCISIONAL BIOPSY Left 1994 (-), 2004 (-)   benign  . BREAST EXCISIONAL BIOPSY Left 03/01/2019   path pending x 2  . COLONOSCOPY  2008    Dr. Donnella Sham  . COLONOSCOPY WITH PROPOFOL N/A 04/12/2019   Procedure: COLONOSCOPY WITH PROPOFOL;  Surgeon: Lollie Sails, MD;  Location: [facility] ENDOSCOPY;  Service: Endoscopy;  Laterality: N/A;  . IR RADIOLOGIST EVAL & MGMT  08/28/2019  . KNEE SURGERY  2007  . LUNG LOBECTOMY Right    middle lobe  . PORTACATH PLACEMENT Right 02/09/2017   Procedure: INSERTION PORT-A-CATH;  Surgeon: Nestor Lewandowsky, MD;  Location: [facility] ORS;  Service: General;  Laterality: Right;  . SALIVARY GLAND SURGERY Left 1983   with lymph node removal also  . SALIVARY GLAND SURGERY     salivary gland removal   . UPPER GI ENDOSCOPY  2008  . VIDEO BRONCHOSCOPY WITH ENDOBRONCHIAL ULTRASOUND Right 01/24/2017   Procedure: VIDEO BRONCHOSCOPY WITH ENDOBRONCHIAL ULTRASOUND;  Surgeon: [name], MD;  Location: [facility] ORS;  Service: Pulmonary;  Laterality: Right;  Allergies: Other, Penicillins, and Sulfa antibiotics  Medications: Prior to Admission medications   Medication Sig Start Date End Date Taking? Authorizing Provider  albuterol (PROVENTIL HFA;VENTOLIN HFA) 108 (90 Base) MCG/ACT inhaler Inhale 1-2 puffs into the lungs every 6 (six) hours as needed for wheezing or shortness of breath.   Yes [provider]  ascorbic acid (VITAMIN C) 100 MG tablet Take 100 mg by mouth daily.    Yes [provider]  B Complex Vitamins (VITAMIN B COMPLEX PO) Take 1 tablet by mouth daily.    Yes [provider]  Biotin w/ Vitamins C & E (HAIR/SKIN/NAILS PO) Take 1 tablet by mouth daily.    Yes [provider]  Calcium Carb-Cholecalciferol (CALCIUM 600+D) 600-800 MG-UNIT TABS Take 2 tablets by mouth daily.   Yes [provider]  Cholecalciferol (D3 MAXIMUM STRENGTH) 5000 units capsule Take 5,000 Units by mouth daily.   Yes [provider]  COLLAGEN PO Take 1,000 mg by mouth daily.   Yes [provider]  diclofenac Sodium (VOLTAREN) 1 % GEL Apply 2 g topically 4 (four) times daily as needed (pain).  07/08/19 07/07/20 Yes [provider]  fexofenadine (ALLEGRA) 180 MG tablet Take 180 mg by mouth daily as needed for allergies.    Yes [provider]  fluticasone (FLONASE) 50 MCG/ACT nasal spray Place 2 sprays into both nostrils daily as needed for allergies or rhinitis.   Yes [provider]  Fluticasone-Salmeterol (ADVAIR) 250-50 MCG/DOSE AEPB Inhale 1 puff into the lungs 2 (two) times daily as needed (for respiratory issues.).   Yes [provider]  lansoprazole (PREVACID) 30 MG capsule Take 30 mg by mouth daily.    Yes [provider]  losartan (COZAAR) 25 MG tablet Take 25 mg by mouth at bedtime.  12/08/16  Yes [provider]  osimertinib mesylate (TAGRISSO) 80 MG tablet Take 1 tablet (80 mg total) by mouth daily. 07/22/19  Yes Lloyd Huger, MD  polyethylene glycol (MIRALAX / GLYCOLAX) packet Take 17 g by mouth daily as needed for mild constipation.   Yes [provider]  PREMARIN 1.25 MG tablet Take 1.25 mg by mouth daily.  08/02/17  Yes [provider]  prochlorperazine (COMPAZINE) 10 MG tablet Take 1 tablet (10 mg total) by mouth every 6 (six) hours as needed for nausea or vomiting. 07/24/18  Yes Lloyd Huger, MD  zinc gluconate 50 MG tablet Take 50 mg by mouth daily.   Yes [provider]  cyclobenzaprine (FLEXERIL) 5 MG tablet Take 1 tablet (5 mg total) by mouth 3 (three) times daily as needed for muscle spasms. Patient not taking: Reported on 09/24/2019  08/20/19   Lloyd Huger, MD  lidocaine-prilocaine (EMLA) cream Apply 1 application topically as needed. Apply generously over the Mediport 45 minutes prior to chemotherapy. Patient not taking: Reported on 09/24/2019 02/13/17   Cammie Sickle, MD  metoCLOPramide (REGLAN) 10 MG tablet Take 1 tablet (10 mg total) by mouth every 8 (eight) hours as needed for nausea. Patient not taking: Reported on 09/24/2019 02/13/17   Cammie Sickle, MD  ondansetron (ZOFRAN) 8 MG tablet Take 1 tablet (8 mg total) by mouth every 8 (eight) hours as needed for nausea or vomiting (start 3 days; after chemo). Patient not taking: Reported on 09/24/2019 02/13/17   Cammie Sickle, MD  triamcinolone (KENALOG) 0.1 % paste Use as directed 1 application in the mouth or throat 2 (two) times daily. Patient not taking:  Reported on 09/24/2019 07/24/18   Lloyd Huger, MD     Vital Signs: Blood pressure 135/91, heart rate 81, temp 98.2, respirations 18, O2 sat 100% room air   Physical Exam:  awake, alert.  Chest clear to auscultation bilaterally.  Clean, intact right chest wall Port-A-Cath;  heart with regular rate and rhythm.  Abdomen soft, positive bowel sounds, nontender.  No lower extremity edema  Imaging: DG Chest 1 View  Result Date: 10/02/2019 CLINICAL DATA:  Preop for CT ablation. History right lung cancer prior right middle lobe lobectomy. EXAM: CHEST  1 VIEW COMPARISON:  Chest x-ray 07/26/2019 and chest CT 06/20/2019 FINDINGS: The right IJ power port is stable. The cardiac silhouette is within normal limits and stable. Stable right hilar density consistent with treated tumor and radiation fibrosis. Stable loss of volume in the right hemithorax with tenting of the right hemidiaphragm. No pleural effusion. No worrisome pulmonary nodules. IMPRESSION: Stable postoperative and post radiation changes involving the right hemithorax. No acute findings. Electronically Signed   By: Marijo Sanes M.D.   On:  10/02/2019 10:27    Labs:  CBC: Recent Labs    03/19/19 1142 04/12/19 1307 06/20/19 0958 08/15/19 0940  WBC 4.0 3.1* 3.2* 3.1*  HGB 11.1* 11.5* 11.2* 12.0  HCT 33.7* 35.5* 34.4* 35.6*  PLT 207 175 164 186    COAGS: Recent Labs    04/12/19 1307 08/15/19 0940  INR 1.0 0.9    BMP: Recent Labs    10/25/18 1324 01/28/19 1157 03/19/19 1142 06/20/19 0958  NA 137 135 132* 136  K 4.2 4.1 4.3 4.0  CL 105 103 99 104  CO2 26 24 24 25   GLUCOSE 112* 97 98 94  BUN 20 18 19 16   CALCIUM 8.9 8.6* 8.9 8.6*  CREATININE 0.91 0.79 0.80 0.87  GFRNONAA >60 >60 >60 >60  GFRAA >60 >60 >60 >60    LIVER FUNCTION TESTS: Recent Labs    10/25/18 1324 01/28/19 1157 03/19/19 1142 06/20/19 0958  BILITOT 0.4 0.5 0.7 0.6  AST 38 34 28 30  ALT 31 29 21 25   ALKPHOS 66 64 57 56  PROT 6.9 6.5 7.3 6.4*  ALBUMIN 3.7 3.5 3.7 3.5    Assessment and Plan: 69 yo WF, nonsmoker, with history of 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  She underwent tele consultation with Dr. Kathlene Cote on 08/28/2019 to discuss treatment options and was deemed an appropriate candidate for CT-guided thermal ablation of the left renal metastasis. She presents today for the procedure.  Details/risks of procedure, including but not limited to, internal bleeding, infection, injury to adjacent structures, anesthesia related complications discussed with patient with her understanding and consent.  Post procedure she will be admitted for overnight observation. She is COVID 19 neg. This procedure involves the use of CT and because of the nature of the planned procedure, it is possible that we will have prolonged use of CT.  Potential radiation risks to you include (but are not limited to) the following: - A slightly elevated risk for cancer  several years later in life. This risk is typically less than 0.5% percent. This risk is low in comparison to the normal incidence of  human cancer, which is 33% for women and 50% for men according to the [employer]. - Radiation induced injury can include skin redness, resembling a rash, tissue breakdown / ulcers and hair loss (which can be temporary or permanent).  The likelihood of either of these occurring depends on the difficulty of the procedure and whether you are sensitive to radiation due to previous procedures, disease, or genetic conditions.   IF your procedure requires a prolonged use of radiation, you will be notified and given written instructions for further action.  It is your responsibility to monitor the irradiated area for the 2 weeks following the procedure and to notify your physician if you are concerned that you have suffered a radiation induced injury.      Electronically Signed: D. Rowe Robert, PA-C 10/02/2019, 10:44 AM   I spent a total of 30 minutes at the the patient's bedside AND on the patient's hospital floor or unit, greater than 50% of which was counseling/coordinating care for CT-guided thermal ablation of left renal metastasis (lung cancer primary)

## 2019-10-02 NOTE — Anesthesia Preprocedure Evaluation (Addendum)
Anesthesia Evaluation  Patient identified by MRN, date of birth, ID band Patient awake    Reviewed: Allergy & Precautions, H&P , NPO status , Patient's Chart, lab work & pertinent test results  History of Anesthesia Complications (+) AWARENESS UNDER ANESTHESIANegative for: history of anesthetic complications  Airway Mallampati: III  TM Distance: <3 FB Neck ROM: limited    Dental  (+) Chipped, Caps   Pulmonary asthma ,  Lung Ca   Pulmonary exam normal        Cardiovascular Exercise Tolerance: Good hypertension, Pt. on medications (-) DOE Normal cardiovascular exam     Neuro/Psych negative neurological ROS  negative psych ROS   GI/Hepatic Neg liver ROS, GERD  Medicated and Controlled,  Endo/Other  negative endocrine ROS  Renal/GU      Musculoskeletal  (+) Arthritis ,   Abdominal   Peds  Hematology negative hematology ROS (+)   Anesthesia Other Findings Past Medical History: No date: Acid reflux No date: Anemia     Comment:  HAD TO HAVE IRON INFUSIONS BACK IN 2015 No date: Arthritis     Comment:  bil hands and back No date: Asthma 2015: Cancer of right lung ([facility])     Comment:  lung cancer - chemo, Dr Genevive Bi removed RML Lobectomy.  No date: Dyspnea     Comment:  WITH EXERTION No date: Hypertension No date: Mitral valve prolapse  Past Surgical History: No date: ABDOMINAL HYSTERECTOMY 1994 (-), 2004 (-): BREAST BIOPSY; Left 2008 : COLONOSCOPY     Comment:  Dr. Donnella Sham 2007: KNEE SURGERY No date: LUNG LOBECTOMY; Right     Comment:  middle lobe 1983: SALIVARY GLAND SURGERY; Left     Comment:  with lymph node removal also 2008: UPPER GI ENDOSCOPY 01/24/2017: VIDEO BRONCHOSCOPY WITH ENDOBRONCHIAL ULTRASOUND; Right     Comment:  Procedure: VIDEO BRONCHOSCOPY WITH ENDOBRONCHIAL               ULTRASOUND;  Surgeon: [name], MD;                Location: [facility] ORS;  Service: Pulmonary;  Laterality:                Right;  BMI    Body Mass Index:  22.49 kg/m      Reproductive/Obstetrics negative OB ROS                           Anesthesia Physical  Anesthesia Plan  ASA: III  Anesthesia Plan: General   Post-op Pain Management:    Induction: Intravenous  PONV Risk Score and Plan: 3 and Treatment may vary due to age or medical condition, Ondansetron, Dexamethasone and Midazolam  Airway Management Planned: Oral ETT  Additional Equipment:   Intra-op Plan:   Post-operative Plan: Extubation in OR  Informed Consent: I have reviewed the patients History and Physical, chart, labs and discussed the procedure including the risks, benefits and alternatives for the proposed anesthesia with the patient or authorized representative who has indicated his/her understanding and acceptance.     Dental advisory given  Plan Discussed with: Anesthesiologist and CRNA  Anesthesia Plan Comments:        Anesthesia Quick Evaluation

## 2019-10-02 NOTE — Progress Notes (Signed)
Patient ID: Lauren Page, female   DOB: 1950-07-15, 69 y.o.   MRN: [id number] Patient complaining of some mild to moderate left flank discomfort.  Denies nausea, vomiting, fever, or respiratory issues. Vital signs stable, afebrile Puncture site left flank with clean, intact dressing; mildly tender to palpation; yellow urine in Foley catheter  A/P: Patient with history of adenocarcinoma of the right lung with biopsy-proven metastasis to the left kidney, status post CT-guided percutaneous thermal ablation of the left renal metastasis earlier today; for overnight observation; check a.m. CBC; DC Foley catheter later this evening; as needed pain medication; advance diet as tolerated ;follow-up virtual visit with Dr. Kathlene Cote in [place] clinic in 4 weeks; follow-up imaging in 3 months.   Rowe Robert, [facility]

## 2019-10-02 NOTE — Sedation Documentation (Signed)
Anesthesia in to sedate and monitor. 

## 2019-10-03 ENCOUNTER — Encounter: Payer: Self-pay | Admitting: *Deleted

## 2019-10-03 NOTE — Progress Notes (Signed)
Patient to discharge to home w/ family. Given all belongings, instructions. Verbalized understanding of all instructions. Will escort to pov when ride arrives.

## 2019-10-03 NOTE — Discharge Summary (Signed)
Patient ID: Lauren Page MRN: [id number] DOB/AGE: 70-Jul-1952 70 y.o.  Admit date: 10/02/2019 Discharge date: 10/03/2019  Supervising Physician: Jacqulynn Cadet  Patient Status: [facility] - In-pt  Admission Diagnoses: Left renal mass  Discharge Diagnoses:  Active Problems:   Left renal mass   Discharged Condition: stable  Hospital Course:  Patient presented to [facility] 10/02/2019 for an image-guided left renal mass microwave thermal ablation with Dr. Kathlene Cote. Procedure occurred without major complications Lauren patient was transferred to floor in stable condition (VSS, left flank incisions stable) for overnight observation. No major events occurred overnight.  Patient awake Lauren alert sitting in bed eating breakfast with no complaints at this Page. Denies flank pain or hematuria. Left flank incisions stable. Plan to discharge home today Lauren follow-up with Dr. Kathlene Cote for televisit 4 weeks after discharge.   Consults: None  Significant Diagnostic Studies: DG Chest 1 View  Result Date: 10/02/2019 CLINICAL DATA:  Preop for CT ablation. History right lung cancer prior right middle lobe lobectomy. EXAM: CHEST  1 VIEW COMPARISON:  Chest x-ray 07/26/2019 Lauren chest CT 06/20/2019 FINDINGS: The right IJ power port is stable. The cardiac silhouette is within normal limits Lauren stable. 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 Stable loss of volume in the right hemithorax with tenting of the right hemidiaphragm. No pleural effusion. No worrisome pulmonary nodules. IMPRESSION: Stable postoperative Lauren post radiation changes involving the right hemithorax. No acute findings. Electronically Signed   By: Marijo Sanes M.D.   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 The patient presents for thermal ablation of the metastatic  lesion. EXAM: CT-GUIDED PERCUTANEOUS THERMAL ABLATION OF LEFT RENAL MASS COMPARISON:  CT of the abdomen on 07/26/2019 ANESTHESIA/SEDATION: Anesthesia:  General Medications: 1 gram IV vancomycin. The antibiotic was administered in an appropriate Page interval prior to needle puncture of the skin. Vancomycin was given due to an antibiotic allergy. CONTRAST:  None. PROCEDURE: The procedure, risks, benefits, Lauren alternatives were explained to the patient. Questions regarding the procedure were encouraged Lauren answered. The patient understands Lauren consents to the procedure. The patient was placed under general anesthesia. A Page-out was performed. Initial unenhanced CT was performed in a prone position to localize the left kidney. Ultrasound was also performed of the left kidney to localize the left renal mass. The left flank region was prepped with chlorhexidine in a sterile fashion, Lauren a sterile drape was applied covering the operative field. A sterile gown Lauren sterile gloves were used for the procedure. Hydrodissection fluid was created by mixing 250 mL of sterile saline with 5 mL of Omnipaque 300 contrast. Under ultrasound guidance, a 15 cm length NeuWave XT ablation probe was advanced into the left renal mass. Probe positioning was confirmed by CT prior to ablation. Prior to ablation, hydrodissection was performed via a 22 gauge spinal needle advanced lateral to the left kidney between the kidney Lauren a segment of the descending colon. Approximately 200 mL of the hydrodissection fluid was ultimately injected Lauren dispersion of fluid confirmed by CT prior to ablation. Thermal ablation was performed through the ablation probe. A 6 minutes cycle of thermal ablation was performed at 65 watts. Cautery cycle was performed on completion as the probe was retracted. Post-procedural CT was performed. COMPLICATIONS: None FINDINGS: By ultrasound, the mid left renal metastatic mass lesion shows slight enlargement since the  prior  ultrasound-guided biopsy on 08/15/2019. The mass now measures approximately 2.2 x 2.5 cm. Based on size of the lesion, a 6 minutes cycle of ablation was chosen. Hydrodissection was performed to move the descending colon laterally Lauren far enough way from the ablation site to avoid any colonic injury. IMPRESSION: CT guided percutaneous thermal ablation of left renal metastatic mass. The patient will be observed overnight. Initial follow-up will be performed in approximately 4 weeks. Electronically Signed   By: Aletta Edouard M.D.   On: 10/02/2019 16:42    Treatments: Image-guided microwave thermal ablation of left renal mass  Discharge Exam: Blood pressure (!) 116/55, pulse 78, temperature (!) 97.5 F (36.4 C), resp. rate 17, height 5\' 3"  (1.6 m), weight 136 lb 11 oz (62 kg), SpO2 99 %. Physical Exam Vitals Lauren nursing note reviewed.  Constitutional:      General: Lauren Page.    Appearance: Normal appearance.  Cardiovascular:     Rate Lauren Rhythm: Normal rate Lauren regular rhythm.     Heart sounds: Normal heart sounds. No murmur.  Pulmonary:     Effort: Pulmonary effort is normal. No respiratory Page.     Breath sounds: Normal breath sounds. No wheezing.  Skin:    General: Skin is warm Lauren dry.     Comments: Left flank incisions soft without tenderness, erythema, edema, drainage, or active bleeding.  Neurological:     Mental Status: Lauren Page, Lauren Page, Lauren Page.  Psychiatric:        Mood Lauren Affect: Mood normal.        Behavior: Behavior normal.     Disposition: Discharge disposition: 01-Home or Self Care       Discharge Instructions    Call MD for:  difficulty breathing, headache or visual disturbances   Complete by: As directed    Call MD for:  extreme fatigue   Complete by: As directed    Call MD for:  hives   Complete by: As directed    Call MD for:  persistant dizziness or light-headedness   Complete by: As directed    Call  MD for:  persistant nausea Lauren vomiting   Complete by: As directed    Call MD for:  redness, tenderness, or signs of infection (pain, swelling, redness, odor or green/yellow discharge around incision site)   Complete by: As directed    Call MD for:  severe uncontrolled pain   Complete by: As directed    Call MD for:  temperature >100.4   Complete by: As directed    Diet - low sodium heart healthy   Complete by: As directed    Discharge instructions   Complete by: As directed    Ok to shower 48 hours post-procedure. Recommend showering with bandage on, remove bandage immediately after showering Lauren pat area dry. No further dressing changes needed after this. No submerging (swimming, bathing) for 7 days post-procedure.   Increase activity slowly   Complete by: As directed    Remove dressing in 24 hours   Complete by: As directed    Following first shower. See discharge instructions for further information.     Allergies as of 10/03/2019      Reactions   Other Other (See Comments)   Cat gut sutures get infected Cat gut sutures get infected   Penicillins Swelling, Rash   Did it involve swelling of the face/tongue/throat, SOB, or low BP? Unknown Did it involve  sudden or severe rash/hives, skin peeling, or any reaction on the inside of your mouth or nose? No Did you need to seek medical attention at a hospital or doctor's office? Yes When did it last happen?30 + years  If all above answers are "NO", may proceed with cephalosporin use.   Sulfa Antibiotics Nausea Lauren Vomiting      Medication List    TAKE these medications   albuterol 108 (90 Base) MCG/ACT inhaler Commonly known as: VENTOLIN HFA Inhale 1-2 puffs into the lungs every 6 (six) hours as needed for wheezing or shortness of breath.   ascorbic acid 100 MG tablet Commonly known as: VITAMIN C Take 100 mg by mouth daily.   Calcium 600+D 600-800 MG-UNIT Tabs Generic drug: Calcium Carb-Cholecalciferol Take 2 tablets by  mouth daily.   COLLAGEN PO Take 1,000 mg by mouth daily.   cyclobenzaprine 5 MG tablet Commonly known as: FLEXERIL Take 1 tablet (5 mg total) by mouth 3 (three) times daily as needed for muscle spasms.   D3 Maximum Strength 125 MCG (5000 UT) capsule Generic drug: Cholecalciferol Take 5,000 Units by mouth daily.   diclofenac Sodium 1 % Gel Commonly known as: VOLTAREN Apply 2 g topically 4 (four) times daily as needed (pain).   fexofenadine 180 MG tablet Commonly known as: ALLEGRA Take 180 mg by mouth daily as needed for allergies.   fluticasone 50 MCG/ACT nasal spray Commonly known as: FLONASE Lauren Page 2 sprays into both nostrils daily as needed for allergies or rhinitis.   Fluticasone-Salmeterol 250-50 MCG/DOSE Aepb Commonly known as: ADVAIR Inhale 1 puff into the lungs 2 (two) times daily as needed (for respiratory issues.).   HAIR/SKIN/NAILS PO Take 1 tablet by mouth daily.   lansoprazole 30 MG capsule Commonly known as: PREVACID Take 30 mg by mouth daily.   lidocaine-prilocaine cream Commonly known as: EMLA Apply 1 application topically as needed. Apply generously over the Mediport 45 minutes prior to chemotherapy.   losartan 25 MG tablet Commonly known as: COZAAR Take 25 mg by mouth at bedtime.   metoCLOPramide 10 MG tablet Commonly known as: REGLAN Take 1 tablet (10 mg total) by mouth every 8 (eight) hours as needed for nausea.   ondansetron 8 MG tablet Commonly known as: ZOFRAN Take 1 tablet (8 mg total) by mouth every 8 (eight) hours as needed for nausea or vomiting (start 3 days; after chemo).   osimertinib mesylate 80 MG tablet Commonly known as: Tagrisso Take 1 tablet (80 mg total) by mouth daily.   polyethylene glycol 17 g packet Commonly known as: MIRALAX / GLYCOLAX Take 17 g by mouth daily as needed for mild constipation.   Premarin 1.25 MG tablet Generic drug: estrogens (conjugated) Take 1.25 mg by mouth daily.   prochlorperazine 10 MG  tablet Commonly known as: COMPAZINE Take 1 tablet (10 mg total) by mouth every 6 (six) hours as needed for nausea or vomiting.   triamcinolone 0.1 % paste Commonly known as: KENALOG Use as directed 1 application in the mouth or throat 2 (two) times daily.   VITAMIN B COMPLEX PO Take 1 tablet by mouth daily.   zinc gluconate 50 MG tablet Take 50 mg by mouth daily.      Follow-up Information    Aletta Edouard, MD Follow up in 4 week(s).   Specialties: Interventional Radiology, Radiology Why: Please follow-up with Dr. Kathlene Cote in clinic for televisit 4 weeks after discharge. Our office will call you to set up this appointment. Contact information: [address] [place] [address] [phone]            Electronically Signed: Earley Abide, PA-C 10/03/2019, 9:09 AM   I have spent Less Than 30 Minutes discharging [name].

## 2019-10-03 NOTE — Discharge Instructions (Signed)
Thermal ablation, Care After This sheet gives you information about how to care for yourself after your procedure. Your health care provider may also give you more specific instructions. If you have problems or questions, contact your health care provider. What can I expect after the procedure? After the procedure, it is common to have:  Soreness around the treatment area.  Mild pain and swelling in the treatment area. Follow these instructions at home: Treatment area care  Follow instructions from your health care provider about how to take care of your incision. Make sure you: ? Wash your hands with soap and water before touching your incisions.  Check your treatment area every day for signs of infection. Check for: ? More redness, swelling, or pain. ? More fluid or blood. ? Warmth. ? Pus or a bad smell.  Keep the treated area clean and dry until it has healed. Clean the area with soap and water.  Ok to shower 48 hours post-procedure, no submerging (swimming, bathing) for 7 days post-procedure. Activity  Ok to return to normal activity at this time. General instructions  Keep all follow-up visits as told by your health care provider. This is important. Contact a health care provider if:  You do not have a bowel movement for 2 days.  You have nausea or vomiting.  You have more redness, swelling, or pain around your treatment area.  You have more fluid or blood coming from your treatment area.  Your treatment area feels warm to the touch.  You have pus or a bad smell coming from your treatment area.  You have a fever. Get help right away if:  You have severe pain.  You have trouble swallowing or breathing.  You have severe weakness or dizziness.  You have chest pain or shortness of breath. This information is not intended to replace advice given to you by your health care provider. Make sure you discuss any questions you have with your health care  provider. Document Revised: 06/02/2017 Document Reviewed: 11/18/2015 Elsevier Patient Education  [place].

## 2019-10-04 ENCOUNTER — Encounter: Payer: Self-pay | Admitting: Genetic Counselor

## 2019-10-04 ENCOUNTER — Ambulatory Visit: Payer: Self-pay | Admitting: Genetic Counselor

## 2019-10-04 ENCOUNTER — Telehealth: Payer: Self-pay | Admitting: Genetic Counselor

## 2019-10-04 DIAGNOSIS — Z1379 Encounter for other screening for genetic and chromosomal anomalies: Secondary | ICD-10-CM | POA: Insufficient documentation

## 2019-10-04 NOTE — Progress Notes (Signed)
HPI:  Lauren Page was previously seen in the [facility] due to a family history of breast cancer and concerns regarding a hereditary predisposition to cancer. Please refer to our prior cancer genetics clinic note for more information regarding our discussion, assessment and recommendations, at the time. Lauren Page recent genetic test results were disclosed to her, as were recommendations warranted by these results. These results and recommendations are discussed in more detail below.  CANCER HISTORY:  Oncology History Overview Note  # July-AUG 2018- RECURRENT STAGE III Adeno Ca [EBUS; Right paratrach LN [Dr.Ram & Dr.Oaks] insuff tissue for testing; check liquid Bx]; PET- no distant mets.  # AUG 13th 2018- Carbo-Taxol with RT [finish oct 12th 2018]  ------------------------------------------------------------------------------   # MAY 2015- .Marland KitchenA right middle lobe adenocarcinoma s/p resection [Dr.Oaks].  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  may of 2015 4.Patient has finished 4 cycles of chemotherapy on January 23, 2014 Now patient would be randomized   to  our [facility] protocol for Tarceva vs. placebo because of EGFR mutation 3.patient desires to get off protocol because of progressive side effect even after reducing dose.  (May 13, 2014) 5.Patient is starting  maintenance investigationally approach with Tarceva vs. placebo September of 2015. 6.Patient came off protocol therapy because of significant GI side effect May 13, 2014   Patient is nonsmoker;      Primary cancer of right middle lobe of lung ([place])    FAMILY HISTORY:  We obtained a detailed, 4-generation family history.  Significant diagnoses are listed below: Family History  Problem Relation Age of Onset  . Breast cancer Maternal Aunt 60  . Alzheimer's disease Maternal Aunt   . Breast  cancer Paternal Aunt        dx. in her late 2s  . Breast cancer Maternal Grandmother        dx. in her 39s  . Cervical cancer Maternal Grandmother 60  . Colon cancer Maternal Grandmother        dx. in her 110s  . Breast cancer Maternal Aunt 60  . Alzheimer's disease Maternal Aunt   . Breast cancer Paternal Aunt        dx. in her late 85s  . Heart disease Paternal Aunt   . Healthy Mother   . Healthy Father   . Colon cancer Father 66  . Skin cancer Father   . Heart attack Father   . Heart attack Paternal Grandfather   . Skin cancer Paternal Aunt   . Heart attack Paternal Uncle 32  . Heart attack Paternal Uncle    Ms. Cavagnaro has one son (age 66) and one granddaughter (age 86). She has a sister who is 44 and may have had skin cancer.   Ms. Kalish mother died at age 75 and did not have cancer. Ms. Waiters mother had two maternal half-sisters and two paternal half-brothers. Both of Ms. Weigand's maternal aunts had breast cancer diagnosed around the age of 62, as well as Alzheimer's disease. Her maternal grandmother died at age 76 and was diagnosed with cervical cancer at age 45, as well as colon cancer and breast cancer at unknown ages. Her maternal grandfather died in his 4s and had a tremor that she believes may have been due to Parkinsons or another neurological disorder.  Ms. Taborn father died at age 54 from a heart attack, and had a history  of colon cancer diagnosed at age 62 and skin cancer. She had three paternal uncle and three paternal aunts. Two of her aunts developed breast cancer in their late 57s. Additionally, two of her uncles died from heart attacks, one at the age of 61. Ms. Knarr paternal grandmother died at age 30, and her paternal grandfather died at age 38 from a heart attack.   Ms. Ebers is unaware of previous family history of genetic testing for hereditary cancer risks. Her maternal ancestors are of unknown descent, and paternal ancestors are of [place] descent.  There is no reported Ashkenazi Jewish ancestry. There is no known consanguinity.  GENETIC TEST RESULTS: Genetic testing reported out on 10/01/2019 through the [facility] CustomNext-Cancer + RNAinsight panel. No pathogenic variants were detected.   The CustomNext-Cancer+RNAinsight panel offered by [facility] includes sequencing and/or deletion/duplication analysis for up to 91 genes, which included the following 48 genes for Ms. Starkel:  APC*, ATM*, AXIN2, BARD1, BMPR1A, BRCA1*, BRCA2*, BRIP1*, CDH1*, CDK4, CDKN2A, CHEK2*, DICER1, EGFR, EPCAM, GREM1, HOXB13, MEN1, MLH1*, MSH2*, MSH3, MSH6*, MUTYH*, NBN, NF1*, NF2, NTHL1, PALB2*, PMS2*, POLD1, POLE, PTEN*, RAD51C*, RAD51D*, RECQL, RET, SDHA, SDHAF2, SDHB, SDHC, SDHD, SMAD4, SMARCA4, STK11, TP53*, TSC1, TSC2, and VHL.  DNA and RNA analyses performed for * genes. . The test report will be scanned into EPIC and located under the Molecular Pathology section of the Results Review tab.  A portion of the result report is included below for reference.     We discussed with Ms. Kagel that because current genetic testing is not perfect, it is possible there may be a gene mutation in one of these genes that current testing cannot detect, but that chance is small.  We also discussed, that there could be another gene that has not yet been discovered, or that we have not yet tested, that is responsible for the cancer diagnoses in the family. It is also possible there is a hereditary cause for the cancer in the family that Ms. Lacombe did not inherit and therefore was not identified in her testing.  Therefore, it is important to remain in touch with cancer genetics in the future so that we can continue to offer Ms. Samara the most up to date genetic testing.   CANCER SCREENING RECOMMENDATIONS: Ms. Cannady test result is considered negative (normal).  This means that we have not identified a hereditary cause for her personal and family history of cancer at this time. While  reassuring, this does not definitively rule out a hereditary predisposition to cancer. It is still possible that there could be genetic mutations that are undetectable by current technology. There could be genetic mutations in genes that have not been tested or identified to increase cancer risk.  Therefore, it is recommended she continue to follow the cancer management and screening guidelines provided by her oncology and primary healthcare providers.   An individual's cancer risk and medical management are not determined by genetic test results alone. Overall cancer risk assessment incorporates additional factors, including personal medical history, family history, and any available genetic information that may result in a personalized plan for cancer prevention and surveillance.  Based on Ms. Wilhelmi's personal and family history, as well as her genetic test results, a statistical model Midwife) was used to estimate her risk of developing breast cancer. Tyrer-Cuzick estimates her lifetime risk of developing breast cancer to be approximately 17.8%.  This lifetime breast cancer risk is a preliminary estimate based on available information using one of several models  endorsed by the [place] (ACS). The ACS recommends consideration of breast MRI screening as an adjunct to mammography for patients at high risk (defined as 20% or greater lifetime risk).    RECOMMENDATIONS FOR FAMILY MEMBERS:  Individuals in this family might be at some increased risk of developing cancer, over the general population risk, simply due to the family history of cancer.  We recommended women in this family have a yearly mammogram beginning at age 18, or 47 years younger than the earliest onset of cancer, an annual clinical breast exam, and perform monthly breast self-exams. Women in this family should also have a gynecological exam as recommended by their primary provider. All family members should have a  colonoscopy by age 3.  It is also possible there is a hereditary cause for the cancer in Ms. Pollan's family that she did not inherit and therefore was not identified in her.  Based on Ms. Mccamy's family history, we recommended her sister have genetic counseling and testing. Ms. Leise will let us know if we can be of any assistance in coordinating genetic counseling and/or testing for this family member.   FOLLOW-UP: Lastly, we discussed with Ms. Spiers that cancer genetics is a rapidly advancing field and it is possible that new genetic tests will be appropriate for her and/or her family members in the future. We encouraged her to remain in contact with cancer genetics on an annual basis so we can update her personal and family histories and let her know of advances in cancer genetics that may benefit this family.   Our contact number was provided. Ms. Krysiak questions were answered to her satisfaction, and she knows she is welcome to call us at anytime with additional questions or concerns.   Clint Guy, MS, [facility] Genetic Counselor [email or web address] Phone: [phone]

## 2019-10-04 NOTE — Telephone Encounter (Signed)
Revealed negative genetic testing.  Discussed that we do not know why there is cancer in the family.  It is possible that there could be a genetic mutation in the family that she did not inherit.  It could also be due to a different gene that we are not testing, or our current technology may not be able detect certain mutations.  It will be a good idea for her to keep in contact with genetics to keep up with whether additional testing may be appropriate in the future.

## 2019-10-07 ENCOUNTER — Other Ambulatory Visit: Payer: Self-pay | Admitting: Oncology

## 2019-10-10 ENCOUNTER — Inpatient Hospital Stay: Payer: Medicare Other

## 2019-10-30 ENCOUNTER — Encounter: Payer: Self-pay | Admitting: *Deleted

## 2019-10-30 ENCOUNTER — Other Ambulatory Visit: Payer: Self-pay | Admitting: Interventional Radiology

## 2019-10-30 ENCOUNTER — Other Ambulatory Visit: Payer: Self-pay

## 2019-10-30 ENCOUNTER — Ambulatory Visit
Admission: RE | Admit: 2019-10-30 | Discharge: 2019-10-30 | Disposition: A | Discharge status: Home / Self Care | Payer: Medicare Other | Source: Ambulatory Visit | Attending: Student | Admitting: Student

## 2019-10-30 DIAGNOSIS — N2889 Other specified disorders of kidney and ureter: Secondary | ICD-10-CM

## 2019-10-30 HISTORY — PX: IR RADIOLOGIST EVAL & MGMT: IMG5224

## 2019-10-30 NOTE — Progress Notes (Signed)
Chief Complaint: Patient was consulted remotely today (TeleHealth) for follow up after thermal ablation of a left renal metastasis.  History of Present Illness: Lauren Page is a 69 y.o. 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 She did state that it took a couple of weeks to fully recover from anesthesia. She has not had significant pain but did have a couple episodes of left flank pain recently while planting bushes that went away after rest. She denies any urinary symptoms. She does have some dyspnea with exertion. She is scheduled to have restaging CT studies on 11/14/2019 and will be following up with Dr. Grayland Ormond shortly after that.  Past Medical History:  Diagnosis Date   Abnormal echocardiogram    Acid reflux    Allergic rhinitis    Anemia    HAD TO HAVE IRON INFUSIONS BACK IN 2015   Arthritis    bil hands and back   Asthma    Bloody discharge from left nipple    Breast mass    bilateral    Cancer of right lung ([place]) 2015   lung cancer - chemo, Dr Genevive Bi removed RML Lobectomy.    Colon polyps    COPD (chronic obstructive pulmonary disease) (HCC)    Dyspnea    WITH EXERTION   Family history of breast cancer    Family history of cervical cancer    Family history of colon cancer    Family history of skin cancer    Hypertension    Malignant neoplasm of middle lobe of right lung ([place])    Mitral valve prolapse    Mitral valve prolapse    Non-small cell lung cancer ([place])    Personal history of chemotherapy    F/U Lung Cancer   Personal history of radiation therapy    Lung cancer   Primary cancer of right middle lobe of lung ([place]) 03/11/2016   Stage IV adenocarcinoma of lung, unspecified laterality ([place])     Past Surgical History:  Procedure Laterality Date   ABDOMINAL HYSTERECTOMY     BAND HEMORRHOIDECTOMY     BREAST BIOPSY Left  03/01/2019   Procedure: LEFT BREAST EXCISIONAL BIOPSY WITH NEEDLE LOCALIZATION;  Surgeon: Herbert Pun, MD;  Location: [facility] ORS;  Service: General;  Laterality: Left;   BREAST EXCISIONAL BIOPSY Left 1994 (-), 2004 (-)   benign   BREAST EXCISIONAL BIOPSY Left 03/01/2019   path pending x 2   COLONOSCOPY  2008    Dr. Donnella Sham   COLONOSCOPY WITH PROPOFOL N/A 04/12/2019   Procedure: COLONOSCOPY WITH PROPOFOL;  Surgeon: Lollie Sails, MD;  Location: [facility] ENDOSCOPY;  Service: Endoscopy;  Laterality: N/A;   IR RADIOLOGIST EVAL & MGMT  08/28/2019   IR RADIOLOGIST EVAL & MGMT  10/30/2019   KNEE SURGERY  2007   LUNG LOBECTOMY Right    middle lobe   PORTACATH PLACEMENT Right 02/09/2017   Procedure: INSERTION PORT-A-CATH;  Surgeon: Nestor Lewandowsky, MD;  Location: [facility] ORS;  Service: General;  Laterality: Right;   RADIOLOGY WITH ANESTHESIA N/A 10/02/2019   Procedure: MICROWAVE THERMA ABLATION;  Surgeon: Aletta Edouard, MD;  Location: [facility] ORS;  Service: Radiology;  Laterality: N/A;   SALIVARY GLAND SURGERY Left 1983   with lymph node removal also   SALIVARY GLAND SURGERY     salivary gland removal    UPPER GI ENDOSCOPY  2008   VIDEO BRONCHOSCOPY WITH ENDOBRONCHIAL  ULTRASOUND Right 01/24/2017   Procedure: VIDEO BRONCHOSCOPY WITH ENDOBRONCHIAL ULTRASOUND;  Surgeon: [name], MD;  Location: [facility] ORS;  Service: Pulmonary;  Laterality: Right;    Allergies: Other, Penicillins, and Sulfa antibiotics  Medications: Prior to Admission medications   Medication Sig Start Date End Date Taking? Authorizing Provider  albuterol (PROVENTIL HFA;VENTOLIN HFA) 108 (90 Base) MCG/ACT inhaler Inhale 1-2 puffs into the lungs every 6 (six) hours as needed for wheezing or shortness of breath.    [provider]  ascorbic acid (VITAMIN C) 100 MG tablet Take 100 mg by mouth daily.     [provider]  B Complex Vitamins (VITAMIN B COMPLEX PO) Take 1 tablet by mouth daily.      [provider]  Biotin w/ Vitamins C & E (HAIR/SKIN/NAILS PO) Take 1 tablet by mouth daily.    [provider]  Calcium Carb-Cholecalciferol (CALCIUM 600+D) 600-800 MG-UNIT TABS Take 2 tablets by mouth daily.    [provider]  Cholecalciferol (D3 MAXIMUM STRENGTH) 5000 units capsule Take 5,000 Units by mouth daily.    [provider]  COLLAGEN PO Take 1,000 mg by mouth daily.    [provider]  cyclobenzaprine (FLEXERIL) 5 MG tablet Take 1 tablet (5 mg total) by mouth 3 (three) times daily as needed for muscle spasms. Patient not taking: Reported on 09/24/2019 08/20/19   Lloyd Huger, MD  diclofenac Sodium (VOLTAREN) 1 % GEL Apply 2 g topically 4 (four) times daily as needed (pain).  07/08/19 07/07/20  [provider]  fexofenadine (ALLEGRA) 180 MG tablet Take 180 mg by mouth daily as needed for allergies.     [provider]  fluticasone (FLONASE) 50 MCG/ACT nasal spray Place 2 sprays into both nostrils daily as needed for allergies or rhinitis.    [provider]  Fluticasone-Salmeterol (ADVAIR) 250-50 MCG/DOSE AEPB Inhale 1 puff into the lungs 2 (two) times daily as needed (for respiratory issues.).    [provider]  lansoprazole (PREVACID) 30 MG capsule Take 30 mg by mouth daily.     [provider]  lidocaine-prilocaine (EMLA) cream Apply 1 application topically as needed. Apply generously over the Mediport 45 minutes prior to chemotherapy. Patient not taking: Reported on 09/24/2019 02/13/17   Cammie Sickle, MD  losartan (COZAAR) 25 MG tablet Take 25 mg by mouth at bedtime.  12/08/16   [provider]  metoCLOPramide (REGLAN) 10 MG tablet Take 1 tablet (10 mg total) by mouth every 8 (eight) hours as needed for nausea. Patient not taking: Reported on 09/24/2019 02/13/17   Cammie Sickle, MD  ondansetron (ZOFRAN) 8 MG tablet Take 1 tablet (8 mg total) by mouth every 8 (eight) hours  as needed for nausea or vomiting (start 3 days; after chemo). Patient not taking: Reported on 09/24/2019 02/13/17   Cammie Sickle, MD  osimertinib mesylate (TAGRISSO) 80 MG tablet Take 1 tablet (80 mg total) by mouth daily. 07/22/19   Lloyd Huger, MD  polyethylene glycol (MIRALAX / Floria Raveling) packet Take 17 g by mouth daily as needed for mild constipation.    [provider]  PREMARIN 1.25 MG tablet Take 1.25 mg by mouth daily.  08/02/17   [provider]  prochlorperazine (COMPAZINE) 10 MG tablet TAKE 1 TABLET (10 MG TOTAL) BY MOUTH EVERY 6 (SIX) HOURS AS NEEDED FOR NAUSEA OR VOMITING. 10/07/19   Lloyd Huger, MD  triamcinolone (KENALOG) 0.1 % paste Use as directed 1 application in  the mouth or throat 2 (two) times daily. Patient not taking: Reported on 09/24/2019 07/24/18   Lloyd Huger, MD  zinc gluconate 50 MG tablet Take 50 mg by mouth daily.    [provider]     Family History  Problem Relation Age of Onset   Breast cancer Maternal Aunt 66   Alzheimer's disease Maternal Aunt    Breast cancer Paternal Aunt        dx. in her late 71s   Breast cancer Maternal Grandmother        dx. in her 47s   Cervical cancer Maternal Grandmother 60   Colon cancer Maternal Grandmother        dx. in her 105s   Breast cancer Maternal Aunt 60   Alzheimer's disease Maternal Aunt    Breast cancer Paternal Aunt        dx. in her late 29s   Heart disease Paternal 6    Healthy Mother    Healthy Father    Colon cancer Father 29   Skin cancer Father    Heart attack Father    Heart attack Paternal Grandfather    Skin cancer Paternal Aunt    Heart attack Paternal Uncle 83   Heart attack Paternal Uncle     Social History   Socioeconomic History   Marital status: Married    Spouse name: Not on file   Number of children: Not on file   Years of education: Not on file   Highest education level: Not on file  Occupational  History   Not on file  Tobacco Use   Smoking status: Never Smoker   Smokeless tobacco: Never Used  Substance and Sexual Activity   Alcohol use: Yes    Alcohol/week: 7.0 standard drinks    Types: 7 Glasses of wine per week   Drug use: No   Sexual activity: Not on file  Other Topics Concern   Not on file  Social History Narrative   Not on file   Social Determinants of Health   Financial Resource Strain:    Difficulty of Paying Living Expenses:   Food Insecurity:    Worried About Charity fundraiser in the Last Year:    Arboriculturist in the Last Year:   Transportation Needs:    Film/video editor (Medical):    Lack of Transportation (Non-Medical):   Physical Activity:    Days of Exercise per Week:    Minutes of Exercise per Session:   Stress:    Feeling of Stress :   Social Connections:    Frequency of Communication with Friends and Family:    Frequency of Social Gatherings with Friends and Family:    Attends Religious Services:    Active Member of Clubs or Organizations:    Attends Music therapist:    Marital Status:     ECOG Status: 1 - Symptomatic but completely ambulatory  Review of Systems  Constitutional: Negative.   Respiratory: Positive for shortness of breath.   Cardiovascular: Negative.   Gastrointestinal: Negative.   Genitourinary: Positive for flank pain. Negative for difficulty urinating, dysuria, frequency and hematuria.  Neurological: Negative.     Review of Systems: A 12 point ROS discussed and pertinent positives are indicated in the HPI above.  All other systems are negative.  Physical Exam No direct physical exam was performed (except for noted visual exam findings with Video Visits).    Vital Signs: There were no  vitals taken for this visit.  Imaging: DG Chest 1 View  Result Date: 10/02/2019 CLINICAL DATA:  Preop for CT ablation. History right lung cancer prior right middle lobe lobectomy. EXAM:  CHEST  1 VIEW COMPARISON:  Chest x-ray 07/26/2019 and chest CT 06/20/2019 FINDINGS: The right IJ power port is stable. The cardiac silhouette is within normal limits and stable. Stable right hilar density consistent with treated tumor and radiation fibrosis. Stable loss of volume in the right hemithorax with tenting of the right hemidiaphragm. No pleural effusion. No worrisome pulmonary nodules. IMPRESSION: Stable postoperative and post radiation changes involving the right hemithorax. No acute findings. Electronically Signed   By: Marijo Sanes M.D.   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 The patient presents for thermal ablation of the metastatic lesion. EXAM: CT-GUIDED PERCUTANEOUS THERMAL ABLATION OF LEFT RENAL MASS COMPARISON:  CT of the abdomen on 07/26/2019 ANESTHESIA/SEDATION: Anesthesia:  General Medications: 1 gram IV vancomycin. The antibiotic was administered in an appropriate time interval prior to needle puncture of the skin. Vancomycin was given due to an antibiotic allergy. CONTRAST:  None. PROCEDURE: The procedure, risks, benefits, and alternatives were explained to the patient. Questions regarding the procedure were encouraged and answered. The patient understands and consents to the procedure. The patient was placed under general anesthesia. A time-out was performed. Initial unenhanced CT was performed in a prone position to localize the left kidney. Ultrasound was also performed of the left kidney to localize the left renal mass. The left flank region was prepped with chlorhexidine in a sterile fashion, and a sterile drape was applied covering the operative field. A sterile gown and sterile gloves were used for the procedure. Hydrodissection fluid was created by mixing 250 mL of sterile saline with 5 mL of Omnipaque 300 contrast. Under ultrasound  guidance, a 15 cm length NeuWave XT ablation probe was advanced into the left renal mass. Probe positioning was confirmed by CT prior to ablation. Prior to ablation, hydrodissection was performed via a 22 gauge spinal needle advanced lateral to the left kidney between the kidney and a segment of the descending colon. Approximately 200 mL of the hydrodissection fluid was ultimately injected and dispersion of fluid confirmed by CT prior to ablation. Thermal ablation was performed through the ablation probe. A 6 minutes cycle of thermal ablation was performed at 65 watts. Cautery cycle was performed on completion as the probe was retracted. Post-procedural CT was performed. COMPLICATIONS: None FINDINGS: By ultrasound, the mid left renal metastatic mass lesion shows slight enlargement since the prior ultrasound-guided biopsy on 08/15/2019. The mass now measures approximately 2.2 x 2.5 cm. Based on size of the lesion, a 6 minutes cycle of ablation was chosen. Hydrodissection was performed to move the descending colon laterally and far enough way from the ablation site to avoid any colonic injury. IMPRESSION: CT guided percutaneous thermal ablation of left renal metastatic mass. The patient will be observed overnight. Initial follow-up will be performed in approximately 4 weeks. Electronically Signed   By: Aletta Edouard M.D.   On: 10/02/2019 16:42   IR Radiologist Eval & Mgmt  Result Date: 10/30/2019 Please refer to notes tab for details about interventional procedure. (Op Note)   Labs:  CBC: Recent Labs    04/12/19 1307 06/20/19 0958 08/15/19 0940 10/02/19 1030  WBC 3.1* 3.2* 3.1* 4.1  HGB 11.5* 11.2*  12.0 11.8*  HCT 35.5* 34.4* 35.6* 36.0  PLT 175 164 186 222    COAGS: Recent Labs    04/12/19 1307 08/15/19 0940 10/02/19 1030  INR 1.0 0.9 1.0    BMP: Recent Labs    01/28/19 1157 03/19/19 1142 06/20/19 0958 10/02/19 1030  NA 135 132* 136 139  K 4.1 4.3 4.0 3.8  CL 103 99 104 106    CO2 24 24 25 24   GLUCOSE 97 98 94 98  BUN 18 19 16 16   CALCIUM 8.6* 8.9 8.6* 9.1  CREATININE 0.79 0.80 0.87 0.90  GFRNONAA >60 >60 >60 >60  GFRAA >60 >60 >60 >60    LIVER FUNCTION TESTS: Recent Labs    01/28/19 1157 03/19/19 1142 06/20/19 0958  BILITOT 0.5 0.7 0.6  AST 34 28 30  ALT 29 21 25   ALKPHOS 64 57 56  PROT 6.5 7.3 6.4*  ALBUMIN 3.5 3.7 3.5    TUMOR MARKERS: No results for input(s): AFPTM, CEA, CA199, CHROMGRNA in the last 8760 hours.  Assessment and Plan:  Mrs. Thau has recovered after ablation of a left renal metastasis. She will be having restaging imaging soon and the abdominal CT portion will be done with a multiphase protocol to adequately evaluate the ablation site. I will follow up with her after her CT studies are performed.  Electronically Signed: Azzie Roup 10/30/2019, 4:47 PM     I spent a total of 15 Minutes in remote  clinical consultation, greater than 50% of which was counseling/coordinating care post left renal ablation.    Visit type: Audio only (telephone). Audio (no video) only due to malfunction of telemed software.. Alternative for in-person consultation at [facility], [address] [place], [place], [place]. This visit type was conducted due to national recommendations for restrictions regarding the COVID-19 Pandemic (e.g. social distancing).  This format is felt to be most appropriate for this patient at this time.  All issues noted in this document were discussed and addressed.

## 2019-11-04 ENCOUNTER — Other Ambulatory Visit: Payer: Self-pay

## 2019-11-04 ENCOUNTER — Ambulatory Visit (INDEPENDENT_AMBULATORY_CARE_PROVIDER_SITE_OTHER): Payer: Medicare Other | Admitting: Dermatology

## 2019-11-04 DIAGNOSIS — D692 Other nonthrombocytopenic purpura: Secondary | ICD-10-CM

## 2019-11-04 DIAGNOSIS — L820 Inflamed seborrheic keratosis: Secondary | ICD-10-CM

## 2019-11-04 DIAGNOSIS — L821 Other seborrheic keratosis: Secondary | ICD-10-CM

## 2019-11-04 DIAGNOSIS — D485 Neoplasm of uncertain behavior of skin: Secondary | ICD-10-CM

## 2019-11-04 DIAGNOSIS — C44729 Squamous cell carcinoma of skin of left lower limb, including hip: Secondary | ICD-10-CM | POA: Diagnosis not present

## 2019-11-04 DIAGNOSIS — L578 Other skin changes due to chronic exposure to nonionizing radiation: Secondary | ICD-10-CM

## 2019-11-04 NOTE — Patient Instructions (Signed)

## 2019-11-04 NOTE — Progress Notes (Signed)
Follow-Up Visit   Subjective  Lauren Page is a 69 y.o. female who presents for the following: Other (Spots on left leg that she hits shaving and some scaly spots of rigth arm.).    The following portions of the chart were reviewed this encounter and updated as appropriate:  Tobacco  Allergies  Meds  Problems  Med Hx  Surg Hx  Fam Hx      Review of Systems:  No other skin or systemic complaints except as noted in HPI or Assessment and Plan.  Objective  Well appearing patient in no apparent distress; mood and affect are within normal limits.  A focused examination was performed including face, arms/hands, legs, back. Relevant physical exam findings are noted in the Assessment and Plan.  Objective  Right Forearm, back, left lower leg (4): Erythematous keratotic or waxy stuck-on papule or plaque.   Objective  Left pretibial: 0.7 cm hyperkeratotic papule   Assessment & Plan    Actinic Damage - diffuse scaly erythematous macules with underlying dyspigmentation - Recommend daily broad spectrum sunscreen SPF 30+ to sun-exposed areas, reapply every 2 hours as needed.  - Call for new or changing lesions.  Purpura - Violaceous macules and patches - Benign - Related to age, sun damage and/or use of blood thinners - Observe - Can use OTC arnica containing moisturizer such as Dermend Bruise Formula if desired - Call for worsening or other concerns  Seborrheic Keratoses - Stuck-on, waxy, tan-brown papules and plaques  - Discussed benign etiology and prognosis. - Observe - Call for any changes    Inflamed seborrheic keratosis (4) Right Forearm, back, left lower leg  Destruction of lesion - Right Forearm, back, left lower leg Complexity: simple   Destruction method: cryotherapy   Informed consent: discussed and consent obtained   Timeout:  patient name, date of birth, surgical site, and procedure verified Lesion destroyed using liquid nitrogen: Yes   Region  frozen until ice ball extended beyond lesion: Yes   Outcome: patient tolerated procedure well with no complications   Post-procedure details: wound care instructions given    Neoplasm of uncertain behavior of skin Left pretibial  Epidermal / dermal shaving  Lesion length (cm):  0.7 Lesion width (cm):  0.7 Margin per side (cm):  0.2 Total excision diameter (cm):  1.1 Informed consent: discussed and consent obtained   Timeout: patient name, date of birth, surgical site, and procedure verified   Procedure prep:  Patient was prepped and draped in usual sterile fashion Prep type:  Isopropyl alcohol Anesthesia: the lesion was anesthetized in a standard fashion   Anesthetic:  1% lidocaine w/ epinephrine 1-100,000 buffered w/ 8.4% NaHCO3 Instrument used: flexible razor blade   Hemostasis achieved with: pressure, aluminum chloride and electrodesiccation   Outcome: patient tolerated procedure well   Post-procedure details: sterile dressing applied and wound care instructions given   Dressing type: bandage and petrolatum    Destruction of lesion Complexity: extensive   Destruction method: electrodesiccation and curettage   Informed consent: discussed and consent obtained   Timeout:  patient name, date of birth, surgical site, and procedure verified Procedure prep:  Patient was prepped and draped in usual sterile fashion Prep type:  Isopropyl alcohol Anesthesia: the lesion was anesthetized in a standard fashion   Anesthetic:  1% lidocaine w/ epinephrine 1-100,000 buffered w/ 8.4% NaHCO3 Curettage performed in three different directions: Yes   Electrodesiccation performed over the curetted area: Yes   Lesion length (cm):  0.7 Lesion  width (cm):  0.7 Margin per side (cm):  0.2 Final wound size (cm):  1.1 Hemostasis achieved with:  pressure, aluminum chloride and electrodesiccation Outcome: patient tolerated procedure well with no complications   Post-procedure details: sterile dressing  applied and wound care instructions given   Dressing type: bandage and petrolatum    Specimen 1 - Surgical pathology Differential Diagnosis: SCC vs other  Check Margins: No 0.7 cm hyperkeratotic papule  SCC (squamous cell carcinoma), leg, left  Return in about 6 months (around 05/06/2020) for TBSE.   I, Ashok Cordia, CMA, am acting as scribe for Sarina Ser, MD .    Documentation: I have reviewed the above documentation for accuracy and completeness, and I agree with the above.  Sarina Ser, MD

## 2019-11-06 ENCOUNTER — Encounter: Payer: Self-pay | Admitting: Dermatology

## 2019-11-07 ENCOUNTER — Telehealth: Payer: Self-pay

## 2019-11-07 NOTE — Telephone Encounter (Signed)
LM on VM to return my call (pathology results) 

## 2019-11-13 ENCOUNTER — Other Ambulatory Visit: Payer: Self-pay | Admitting: Emergency Medicine

## 2019-11-13 ENCOUNTER — Telehealth: Payer: Self-pay

## 2019-11-13 DIAGNOSIS — C7900 Secondary malignant neoplasm of unspecified kidney and renal pelvis: Secondary | ICD-10-CM

## 2019-11-13 NOTE — Telephone Encounter (Signed)
Discussed biopsy results with pt  °

## 2019-11-14 ENCOUNTER — Other Ambulatory Visit: Payer: Self-pay

## 2019-11-14 ENCOUNTER — Ambulatory Visit
Admission: RE | Admit: 2019-11-14 | Discharge: 2019-11-14 | Disposition: A | Discharge status: Home / Self Care | Payer: Medicare Other | Source: Ambulatory Visit | Attending: Oncology | Admitting: Oncology

## 2019-11-14 ENCOUNTER — Inpatient Hospital Stay: Payer: Medicare Other | Attending: Oncology

## 2019-11-14 DIAGNOSIS — C7902 Secondary malignant neoplasm of left kidney and renal pelvis: Secondary | ICD-10-CM | POA: Insufficient documentation

## 2019-11-14 DIAGNOSIS — R197 Diarrhea, unspecified: Secondary | ICD-10-CM | POA: Diagnosis not present

## 2019-11-14 DIAGNOSIS — C3490 Malignant neoplasm of unspecified part of unspecified bronchus or lung: Secondary | ICD-10-CM | POA: Insufficient documentation

## 2019-11-14 DIAGNOSIS — Z452 Encounter for adjustment and management of vascular access device: Secondary | ICD-10-CM | POA: Diagnosis not present

## 2019-11-14 DIAGNOSIS — J90 Pleural effusion, not elsewhere classified: Secondary | ICD-10-CM | POA: Diagnosis not present

## 2019-11-14 DIAGNOSIS — C7900 Secondary malignant neoplasm of unspecified kidney and renal pelvis: Secondary | ICD-10-CM

## 2019-11-14 LAB — CBC WITH DIFFERENTIAL/PLATELET
Abs Immature Granulocytes: 0.01 10*3/uL (ref 0.00–0.07)
Basophils Absolute: 0 10*3/uL (ref 0.0–0.1)
Basophils Relative: 1 %
Eosinophils Absolute: 0.2 10*3/uL (ref 0.0–0.5)
Eosinophils Relative: 4 %
HCT: 32 % — ABNORMAL LOW (ref 36.0–46.0)
Hemoglobin: 10.9 g/dL — ABNORMAL LOW (ref 12.0–15.0)
Immature Granulocytes: 0 %
Lymphocytes Relative: 13 %
Lymphs Abs: 0.6 10*3/uL — ABNORMAL LOW (ref 0.7–4.0)
MCH: 29.5 pg (ref 26.0–34.0)
MCHC: 34.1 g/dL (ref 30.0–36.0)
MCV: 86.5 fL (ref 80.0–100.0)
Monocytes Absolute: 0.4 10*3/uL (ref 0.1–1.0)
Monocytes Relative: 9 %
Neutro Abs: 3.4 10*3/uL (ref 1.7–7.7)
Neutrophils Relative %: 73 %
Platelets: 332 10*3/uL (ref 150–400)
RBC: 3.7 MIL/uL — ABNORMAL LOW (ref 3.87–5.11)
RDW: 13.7 % (ref 11.5–15.5)
WBC: 4.6 10*3/uL (ref 4.0–10.5)
nRBC: 0 % (ref 0.0–0.2)

## 2019-11-14 LAB — COMPREHENSIVE METABOLIC PANEL
ALT: 24 U/L (ref 0–44)
AST: 30 U/L (ref 15–41)
Albumin: 3.3 g/dL — ABNORMAL LOW (ref 3.5–5.0)
Alkaline Phosphatase: 71 U/L (ref 38–126)
Anion gap: 9 (ref 5–15)
BUN: 12 mg/dL (ref 8–23)
CO2: 23 mmol/L (ref 22–32)
Calcium: 8.6 mg/dL — ABNORMAL LOW (ref 8.9–10.3)
Chloride: 99 mmol/L (ref 98–111)
Creatinine, Ser: 0.91 mg/dL (ref 0.44–1.00)
GFR calc Af Amer: >60 mL/min (ref >60)
GFR calc non Af Amer: >60 mL/min (ref >60)
Glucose, Bld: 120 mg/dL — ABNORMAL HIGH (ref 70–99)
Potassium: 3.9 mmol/L (ref 3.5–5.1)
Sodium: 131 mmol/L — ABNORMAL LOW (ref 135–145)
Total Bilirubin: 0.6 mg/dL (ref 0.3–1.2)
Total Protein: 6.9 g/dL (ref 6.5–8.1)

## 2019-11-14 MED ORDER — SODIUM CHLORIDE 0.9% FLUSH
10.0000 mL | INTRAVENOUS | Status: DC | PRN
  Administered 2019-11-14: 10 mL via INTRAVENOUS
  Filled 2019-11-14: qty 10

## 2019-11-14 MED ORDER — IOHEXOL 300 MG/ML  SOLN
100.0000 mL | Freq: Once | INTRAMUSCULAR | Status: AC | PRN
  Administered 2019-11-14: 100 mL via INTRAVENOUS

## 2019-11-14 MED ORDER — HEPARIN SOD (PORK) LOCK FLUSH 100 UNIT/ML IV SOLN
500.0000 [IU] | Freq: Once | INTRAVENOUS | Status: AC
  Administered 2019-11-14: 500 [IU] via INTRAVENOUS
  Filled 2019-11-14: qty 5

## 2019-11-15 ENCOUNTER — Other Ambulatory Visit: Payer: Self-pay | Admitting: Emergency Medicine

## 2019-11-15 ENCOUNTER — Encounter: Payer: Self-pay | Admitting: Oncology

## 2019-11-15 DIAGNOSIS — C7900 Secondary malignant neoplasm of unspecified kidney and renal pelvis: Secondary | ICD-10-CM

## 2019-11-17 NOTE — Progress Notes (Signed)
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  INTERVAL HISTORY: Patient returns to clinic today for further evaluation and discussion of her imaging results.  She has recurrent pleural effusion and has some mild shortness of breath, but otherwise feels well.  She is tolerating Tagrisso without significant side effects.  She has no neurologic complaints.  She denies any recent fevers or illnesses.  She has a good appetite and denies weight loss.  She denies any chest pain, shortness of breath, cough, or hemoptysis.  She denies any nausea, vomiting, constipation, or diarrhea.  She has no urinary complaints.  Patient offers no further specific complaints today.  REVIEW OF SYSTEMS:   Review of Systems  Constitutional: Negative.  Negative for fever, malaise/fatigue and weight loss.  Respiratory: Negative.  Negative for cough, hemoptysis and shortness of breath.   Cardiovascular: Negative.  Negative for chest pain and leg swelling.  Gastrointestinal: Negative for abdominal pain, constipation, diarrhea and nausea.  Genitourinary: Negative.  Negative for dysuria and flank pain.  Musculoskeletal: Negative.  Negative for back pain and joint pain.  Skin: Negative.  Negative for rash.  Neurological: Negative.  Negative for sensory change, focal weakness, weakness and headaches.  Psychiatric/Behavioral: Negative.  The patient is not nervous/anxious.     As per HPI. Otherwise, a complete review of systems is negative.  PAST MEDICAL HISTORY: Past Medical History:  Diagnosis Date  . Abnormal echocardiogram   . Acid reflux   . Allergic rhinitis   . Anemia    HAD TO HAVE IRON  INFUSIONS BACK IN 2015  . Arthritis    bil hands and back  . Asthma   . Bloody discharge from left nipple   . Breast mass    bilateral   . Cancer of right lung ([place]) 2015   lung cancer - chemo, Dr Genevive Bi removed RML Lobectomy.   . Colon polyps   . COPD (chronic obstructive pulmonary disease) ([place])   . Dyspnea    WITH EXERTION  . Family history of breast cancer   . Family history of cervical cancer   . Family history of colon cancer   . Family history of skin cancer   . Hypertension   . Malignant neoplasm of middle lobe of right lung ([place])   . Mitral valve prolapse   . Mitral valve prolapse   . Non-small cell lung cancer ([place])   . Personal history of chemotherapy    F/U Lung Cancer  . Personal history of radiation therapy    Lung cancer  . Primary cancer of right middle lobe of lung ([place]) 03/11/2016  . Squamous cell carcinoma of skin 07/07/2009   right supraclavicular/EDC  . Stage IV adenocarcinoma of lung, unspecified laterality ([place])     PAST SURGICAL HISTORY: Past Surgical History:  Procedure Laterality Date  . ABDOMINAL HYSTERECTOMY    . BAND HEMORRHOIDECTOMY    . BREAST BIOPSY Left 03/01/2019   Procedure: LEFT BREAST EXCISIONAL BIOPSY WITH NEEDLE LOCALIZATION;  Surgeon: Herbert Pun, MD;  Location: [facility] ORS;  Service: General;  Laterality: Left;  . BREAST EXCISIONAL BIOPSY Left 1994 (-), 2004 (-)   benign  . BREAST EXCISIONAL BIOPSY Left 03/01/2019   path pending x 2  .  COLONOSCOPY  2008    Dr. Donnella Sham  . COLONOSCOPY WITH PROPOFOL N/A 04/12/2019   Procedure: COLONOSCOPY WITH PROPOFOL;  Surgeon: Lollie Sails, MD;  Location: [facility] ENDOSCOPY;  Service: Endoscopy;  Laterality: N/A;  . IR RADIOLOGIST EVAL & MGMT  08/28/2019  . IR RADIOLOGIST EVAL & MGMT  10/30/2019  . KNEE SURGERY  2007  . LUNG LOBECTOMY Right    middle lobe  . PORTACATH PLACEMENT Right 02/09/2017   Procedure: INSERTION PORT-A-CATH;  Surgeon: Nestor Lewandowsky, MD;  Location: [facility] ORS;  Service:  General;  Laterality: Right;  . RADIOLOGY WITH ANESTHESIA N/A 10/02/2019   Procedure: MICROWAVE THERMA ABLATION;  Surgeon: Aletta Edouard, MD;  Location: [facility] ORS;  Service: Radiology;  Laterality: N/A;  . SALIVARY GLAND SURGERY Left 1983   with lymph node removal also  . SALIVARY GLAND SURGERY     salivary gland removal   . UPPER GI ENDOSCOPY  2008  . VIDEO BRONCHOSCOPY WITH ENDOBRONCHIAL ULTRASOUND Right 01/24/2017   Procedure: VIDEO BRONCHOSCOPY WITH ENDOBRONCHIAL ULTRASOUND;  Surgeon: [name], MD;  Location: [facility] ORS;  Service: Pulmonary;  Laterality: Right;    FAMILY HISTORY: Family History  Problem Relation Age of Onset  . Breast cancer Maternal Aunt 60  . Alzheimer's disease Maternal Aunt   . Breast cancer Paternal Aunt        dx. in her late 76s  . Breast cancer Maternal Grandmother        dx. in her 60s  . Cervical cancer Maternal Grandmother 60  . Colon cancer Maternal Grandmother        dx. in her 29s  . Breast cancer Maternal Aunt 60  . Alzheimer's disease Maternal Aunt   . Breast cancer Paternal Aunt        dx. in her late 23s  . Heart disease Paternal Aunt   . Healthy Mother   . Healthy Father   . Colon cancer Father 32  . Skin cancer Father   . Heart attack Father   . Heart attack Paternal Grandfather   . Skin cancer Paternal Aunt   . Heart attack Paternal Uncle 74  . Heart attack Paternal Uncle     ADVANCED DIRECTIVES (Y/N):  N  HEALTH MAINTENANCE: Social History   Tobacco Use  . Smoking status: Never Smoker  . Smokeless tobacco: Never Used  Substance Use Topics  . Alcohol use: Yes    Alcohol/week: 7.0 standard drinks    Types: 7 Glasses of wine per week  . Drug use: No     Colonoscopy:  PAP:  Bone density:  Lipid panel:  Allergies  Allergen Reactions  . Other Other (See Comments)    Cat gut sutures get infected Cat gut sutures get infected  . Penicillins Swelling and Rash    Did it involve swelling of the  face/tongue/throat, SOB, or low BP? Unknown Did it involve sudden or severe rash/hives, skin peeling, or any reaction on the inside of your mouth or nose? No Did you need to seek medical attention at a hospital or doctor's office? Yes When did it last happen?30 + years  If all above answers are "NO", may proceed with cephalosporin use.    . Sulfa Antibiotics Nausea And Vomiting    Current Outpatient Medications  Medication Sig Dispense Refill  . albuterol (PROVENTIL HFA;VENTOLIN HFA) 108 (90 Base) MCG/ACT inhaler Inhale 1-2 puffs into the lungs every 6 (six) hours as needed for wheezing or shortness of breath.    Marland Kitchen ascorbic  acid (VITAMIN C) 100 MG tablet Take 100 mg by mouth daily.     . B Complex Vitamins (VITAMIN B COMPLEX PO) Take 1 tablet by mouth daily.     . Biotin w/ Vitamins C & E (HAIR/SKIN/NAILS PO) Take 1 tablet by mouth daily.    . Calcium Carb-Cholecalciferol (CALCIUM 600+D) 600-800 MG-UNIT TABS Take 2 tablets by mouth daily.    . Cholecalciferol (D3 MAXIMUM STRENGTH) 5000 units capsule Take 5,000 Units by mouth daily.    . COLLAGEN PO Take 1,000 mg by mouth daily.    . diclofenac Sodium (VOLTAREN) 1 % GEL Apply 2 g topically 4 (four) times daily as needed (pain).     . fexofenadine (ALLEGRA) 180 MG tablet Take 180 mg by mouth daily as needed for allergies.     . fluticasone (FLONASE) 50 MCG/ACT nasal spray Place 2 sprays into both nostrils daily as needed for allergies or rhinitis.    . Fluticasone-Salmeterol (ADVAIR) 250-50 MCG/DOSE AEPB Inhale 1 puff into the lungs 2 (two) times daily as needed (for respiratory issues.).    Marland Kitchen lansoprazole (PREVACID) 30 MG capsule Take 30 mg by mouth daily.     Marland Kitchen losartan (COZAAR) 25 MG tablet Take 25 mg by mouth at bedtime.     Marland Kitchen osimertinib mesylate (TAGRISSO) 80 MG tablet Take 1 tablet (80 mg total) by mouth daily. 30 tablet 5  . polyethylene glycol (MIRALAX / GLYCOLAX) packet Take 17 g by mouth daily as needed for mild constipation.     Marland Kitchen PREMARIN 1.25 MG tablet Take 1.25 mg by mouth daily.   1  . prochlorperazine (COMPAZINE) 10 MG tablet TAKE 1 TABLET (10 MG TOTAL) BY MOUTH EVERY 6 (SIX) HOURS AS NEEDED FOR NAUSEA OR VOMITING. 40 tablet 1  . zinc gluconate 50 MG tablet Take 50 mg by mouth daily.    . cyclobenzaprine (FLEXERIL) 5 MG tablet Take 1 tablet (5 mg total) by mouth 3 (three) times daily as needed for muscle spasms. (Patient not taking: Reported on 11/21/2019) 30 tablet 0  . lidocaine-prilocaine (EMLA) cream Apply 1 application topically as needed. Apply generously over the Mediport 45 minutes prior to chemotherapy. (Patient not taking: Reported on 09/24/2019) 30 g 0  . metoCLOPramide (REGLAN) 10 MG tablet Take 1 tablet (10 mg total) by mouth every 8 (eight) hours as needed for nausea. (Patient not taking: Reported on 09/24/2019) 40 tablet 3  . ondansetron (ZOFRAN) 8 MG tablet Take 1 tablet (8 mg total) by mouth every 8 (eight) hours as needed for nausea or vomiting (start 3 days; after chemo). (Patient not taking: Reported on 09/24/2019) 40 tablet 1  . triamcinolone (KENALOG) 0.1 % paste Use as directed 1 application in the mouth or throat 2 (two) times daily. (Patient not taking: Reported on 09/24/2019) 5 g 12   No current facility-administered medications for this visit.   Facility-Administered Medications Ordered in Other Visits  Medication Dose Route Frequency Provider Last Rate Last Admin  . heparin lock flush 100 unit/mL  500 Units Intravenous Once Lloyd Huger, MD      . sodium chloride flush (NS) 0.9 % injection 10 mL  10 mL Intravenous PRN Lloyd Huger, MD      . sodium chloride flush (NS) 0.9 % injection 10 mL  10 mL Intravenous PRN Lloyd Huger, MD   10 mL at 01/22/18 1419    OBJECTIVE: Vitals:   11/21/19 1451  BP: 119/78  Pulse: 100  Resp: 20  Temp: 98.8 F (37.1 C)  SpO2: 99%     Body mass index is 23.63 kg/m.    ECOG FS:0 - Asymptomatic  General: Well-developed,  well-nourished, no acute distress. Eyes: Pink conjunctiva, anicteric sclera. HEENT: Normocephalic, moist mucous membranes. Lungs: No audible wheezing or coughing. Heart: Regular rate and rhythm. Abdomen: Soft, nontender, no obvious distention. Musculoskeletal: No edema, cyanosis, or clubbing. Neuro: Alert, answering all questions appropriately. Cranial nerves grossly intact. Skin: No rashes or petechiae noted. Psych: Normal affect.  LAB RESULTS:  Lab Results  Component Value Date   NA 131 (L) 11/14/2019   K 3.9 11/14/2019   CL 99 11/14/2019   CO2 23 11/14/2019   GLUCOSE 120 (H) 11/14/2019   BUN 12 11/14/2019   CREATININE 0.91 11/14/2019   CALCIUM 8.6 (L) 11/14/2019   PROT 6.9 11/14/2019   ALBUMIN 3.3 (L) 11/14/2019   AST 30 11/14/2019   ALT 24 11/14/2019   ALKPHOS 71 11/14/2019   BILITOT 0.6 11/14/2019   GFRNONAA >60 11/14/2019   GFRAA >60 11/14/2019    Lab Results  Component Value Date   WBC 4.6 11/14/2019   NEUTROABS 3.4 11/14/2019   HGB 10.9 (L) 11/14/2019   HCT 32.0 (L) 11/14/2019   MCV 86.5 11/14/2019   PLT 332 11/14/2019     STUDIES: CT ABDOMEN PELVIS W WO CONTRAST  Result Date: 11/15/2019 CLINICAL DATA:  Follow-up right lung cancer, diagnosed 2015. Status post left renal mass ablation in March 2021. EXAM: CT CHEST WITH CONTRAST CT ABDOMEN AND PELVIS WITH AND WITHOUT CONTRAST TECHNIQUE: Multidetector CT imaging of the chest was performed during intravenous contrast administration. Multidetector CT imaging of the abdomen and pelvis was performed following the standard protocol before and during bolus administration of intravenous contrast. CONTRAST:  153m OMNIPAQUE IOHEXOL 300 MG/ML  SOLN COMPARISON:  CT abdomen dated 07/26/2019. PET-CT dated 07/09/2019. CT chest dated 06/20/2019. FINDINGS: CT CHEST FINDINGS Cardiovascular: The heart is normal in size. No pericardial effusion. No evidence of thoracic aortic aneurysm. Mild atherosclerotic calcifications of the  aortic arch. Right chest port terminates in the lower SVC. Mediastinum/Nodes: No suspicious mediastinal lymphadenopathy. Visualized thyroid is unremarkable. Lungs/Pleura: Radiation changes in the medial right upper lobe/suprahilar region. Patchy subpleural ground-glass opacities, left lung predominant, suggesting atypical infection/pneumonia. Large right pleural effusion, mildly progressive. Mild irregular pleural thickening (axial image 79), correlate for pleural-based metastases. No pneumothorax. Musculoskeletal: Mild degenerative changes of the mid thoracic spine. CT ABDOMEN AND PELVIS FINDINGS Hepatobiliary: Subcentimeter cyst in the anterior right hepatic lobe (axial image 94). Gallbladder is unremarkable. No intrahepatic or extrahepatic ductal dilatation. Pancreas: Within normal limits. Spleen: Within normal limits. Adrenals/Urinary Tract: Adrenal glands are within normal limits. Status post left renal cryoablation in the posterior interpolar region (series 11/image 49). No associated arterial enhancement to suggest viable tumor. Scattered subcentimeter cysts bilaterally. No hydronephrosis. Bladder is within normal limits. Stomach/Bowel: Stomach is within normal limits. No evidence of bowel obstruction. Normal appendix (series 11/image 108). Vascular/Lymphatic: No evidence of abdominal aortic aneurysm. Mild atherosclerotic calcifications the abdominal aorta. No suspicious abdominopelvic lymphadenopathy. Reproductive: Status post hysterectomy. Left bilateral ovaries are unremarkable. Other: No abdominopelvic ascites. Musculoskeletal: Mild degenerative changes of the lumbar spine. IMPRESSION: Radiation changes in the right upper lobe/suprahilar region. Large right pleural effusion, mildly progressive. Mild irregular right pleural nodularity, correlate for pleural-based metastases. Status post left renal cryoablation. No enhancement to suggest residual/viable tumor. Patchy subpleural ground-glass opacities, left  lung predominant, suggesting atypical infection/pneumonia. Electronically Signed   By: SHenderson NewcomerD.  On: 11/15/2019 09:46   CT Chest W Contrast  Result Date: 11/15/2019 CLINICAL DATA:  Follow-up right lung cancer, diagnosed 2015. Status post left renal mass ablation in March 2021. EXAM: CT CHEST WITH CONTRAST CT ABDOMEN AND PELVIS WITH AND WITHOUT CONTRAST TECHNIQUE: Multidetector CT imaging of the chest was performed during intravenous contrast administration. Multidetector CT imaging of the abdomen and pelvis was performed following the standard protocol before and during bolus administration of intravenous contrast. CONTRAST:  141m OMNIPAQUE IOHEXOL 300 MG/ML  SOLN COMPARISON:  CT abdomen dated 07/26/2019. PET-CT dated 07/09/2019. CT chest dated 06/20/2019. FINDINGS: CT CHEST FINDINGS Cardiovascular: The heart is normal in size. No pericardial effusion. No evidence of thoracic aortic aneurysm. Mild atherosclerotic calcifications of the aortic arch. Right chest port terminates in the lower SVC. Mediastinum/Nodes: No suspicious mediastinal lymphadenopathy. Visualized thyroid is unremarkable. Lungs/Pleura: Radiation changes in the medial right upper lobe/suprahilar region. Patchy subpleural ground-glass opacities, left lung predominant, suggesting atypical infection/pneumonia. Large right pleural effusion, mildly progressive. Mild irregular pleural thickening (axial image 79), correlate for pleural-based metastases. No pneumothorax. Musculoskeletal: Mild degenerative changes of the mid thoracic spine. CT ABDOMEN AND PELVIS FINDINGS Hepatobiliary: Subcentimeter cyst in the anterior right hepatic lobe (axial image 94). Gallbladder is unremarkable. No intrahepatic or extrahepatic ductal dilatation. Pancreas: Within normal limits. Spleen: Within normal limits. Adrenals/Urinary Tract: Adrenal glands are within normal limits. Status post left renal cryoablation in the posterior interpolar region (series  11/image 49). No associated arterial enhancement to suggest viable tumor. Scattered subcentimeter cysts bilaterally. No hydronephrosis. Bladder is within normal limits. Stomach/Bowel: Stomach is within normal limits. No evidence of bowel obstruction. Normal appendix (series 11/image 108). Vascular/Lymphatic: No evidence of abdominal aortic aneurysm. Mild atherosclerotic calcifications the abdominal aorta. No suspicious abdominopelvic lymphadenopathy. Reproductive: Status post hysterectomy. Left bilateral ovaries are unremarkable. Other: No abdominopelvic ascites. Musculoskeletal: Mild degenerative changes of the lumbar spine. IMPRESSION: Radiation changes in the right upper lobe/suprahilar region. Large right pleural effusion, mildly progressive. Mild irregular right pleural nodularity, correlate for pleural-based metastases. Status post left renal cryoablation. No enhancement to suggest residual/viable tumor. Patchy subpleural ground-glass opacities, left lung predominant, suggesting atypical infection/pneumonia. Electronically Signed   By: SJulian HyM.D.   On: 11/15/2019 09:46   IR Radiologist Eval & Mgmt  Result Date: 10/30/2019 Please refer to notes tab for details about interventional procedure. (Op Note)   ONCOLOGY HISTORY: Patient initially underwent resection in May 2015 and was noted to have the EGFR mutation.  She initially received 4 cycles of adjuvant cisplatin and Alimta completing on January 23, 2014.  Patient was on the [facility] protocol for maintenance Tarceva versus placebo, but discontinued treatment in November 2015 because of significant side effects.  Although blinded, presumption was she was receiving Tarceva.  She recently was noted to have a recurrence confirmed by right paratracheal lymph node biopsy on January 24, 2017.  PET scan on February 03, 2017 did not reveal any distant metastasis.  She underwent treatment with weekly carboplatinum and Taxol along with daily XRT finishing in  October 2018.  She initiated maintenance durvalumab on June 29, 2017.  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  Patient started on Tagrisso in April 2019.   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  PLAN:   1. 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  Patient initiated Tagrisso in April 2019.  PET  scan results from July 09, 2019 reviewed independently which may show mild progression of disease.  Patient noted to have an isolated metastasis in left kidney and has since undergone ablation.  CT scan results from Nov 14, 2019 reviewed independently and report as above with no significant progression of disease.  Patient noted to have an enlarging pleural effusion which possibly could be malignant.  She has thoracentesis scheduled for next week.  Continue Tagrisso 80 mg daily at this time.  Return to clinic in 3 months with repeat imaging and further evaluation.   2.  Diarrhea: Patient does not complain of this today.  Continue Lomotil as prescribed. 3.  Cough: Chronic and unchanged, monitor. 4.  Fingernail cracking: Patient does not complain of this today.  Possibly secondary to [place]. 5.  Breast lesion: Benign papilloma.  Patient underwent lumpectomy on March 01, 2019. 6.  Right pleural effusion: Recurrent.  Patient has a repeat thoracentesis scheduled for next week.   Patient expressed understanding and was in agreement with this plan. She also understands that She can call clinic at any time with any questions, concerns, or complaints.    Lloyd Huger, MD   11/22/2019 12:39 PM

## 2019-11-19 ENCOUNTER — Other Ambulatory Visit: Payer: Self-pay | Admitting: Emergency Medicine

## 2019-11-19 DIAGNOSIS — C3490 Malignant neoplasm of unspecified part of unspecified bronchus or lung: Secondary | ICD-10-CM

## 2019-11-21 ENCOUNTER — Inpatient Hospital Stay: Payer: Medicare Other | Admitting: Pharmacist

## 2019-11-21 ENCOUNTER — Encounter: Payer: Self-pay | Admitting: Oncology

## 2019-11-21 ENCOUNTER — Other Ambulatory Visit: Payer: Self-pay

## 2019-11-21 ENCOUNTER — Inpatient Hospital Stay (HOSPITAL_BASED_OUTPATIENT_CLINIC_OR_DEPARTMENT_OTHER): Payer: Medicare Other | Admitting: Oncology

## 2019-11-21 VITALS — BP 119/78 | HR 100 | Temp 98.8°F | Resp 20 | Wt 133.4 lb

## 2019-11-21 DIAGNOSIS — C3490 Malignant neoplasm of unspecified part of unspecified bronchus or lung: Secondary | ICD-10-CM | POA: Diagnosis not present

## 2019-11-21 DIAGNOSIS — C342 Malignant neoplasm of middle lobe, bronchus or lung: Secondary | ICD-10-CM

## 2019-11-21 NOTE — Progress Notes (Signed)
[place]  Telephone:([phone] [phone]  Patient Care Team: [name], MD as PCP - General (Internal Medicine)   Name of the patient: Lauren Page  [id number]  1951/02/03   Date of visit: 11/21/19  HPI: Patient is a 69 y.o. 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  Reason for Consult: Oral chemotherapy follow-up for osimertinib therapy.   PAST MEDICAL HISTORY: Past Medical History:  Diagnosis Date  . Abnormal echocardiogram   . Acid reflux   . Allergic rhinitis   . Anemia    HAD TO HAVE IRON INFUSIONS BACK IN 2015  . Arthritis    bil hands and back  . Asthma   . Bloody discharge from left nipple   . Breast mass    bilateral   . Cancer of right lung ([place]) 2015   lung cancer - chemo, Dr Genevive Bi removed RML Lobectomy.   . Colon polyps   . COPD (chronic obstructive pulmonary disease) ([place])   . Dyspnea    WITH EXERTION  . Family history of breast cancer   . Family history of cervical cancer   . Family history of colon cancer   . Family history of skin cancer   . Hypertension   . Malignant neoplasm of middle lobe of right lung ([place])   . Mitral valve prolapse   . Mitral valve prolapse   . Non-small cell lung cancer ([place])   . Personal history of chemotherapy    F/U Lung Cancer  . Personal history of radiation therapy    Lung cancer  . Primary cancer of right middle lobe of lung ([place]) 03/11/2016  . Squamous cell carcinoma of skin 07/07/2009   right supraclavicular/EDC  . Stage IV adenocarcinoma of lung, unspecified laterality ([place])     PAST SURGICAL HISTORY:  Past Surgical History:  Procedure Laterality Date  . ABDOMINAL HYSTERECTOMY    . BAND HEMORRHOIDECTOMY    . BREAST BIOPSY Left 03/01/2019   Procedure: LEFT BREAST EXCISIONAL BIOPSY WITH NEEDLE LOCALIZATION;  Surgeon: Herbert Pun, MD;  Location: [facility] ORS;   Service: General;  Laterality: Left;  . BREAST EXCISIONAL BIOPSY Left 1994 (-), 2004 (-)   benign  . BREAST EXCISIONAL BIOPSY Left 03/01/2019   path pending x 2  . COLONOSCOPY  2008    Dr. Donnella Sham  . COLONOSCOPY WITH PROPOFOL N/A 04/12/2019   Procedure: COLONOSCOPY WITH PROPOFOL;  Surgeon: Lollie Sails, MD;  Location: [facility] ENDOSCOPY;  Service: Endoscopy;  Laterality: N/A;  . IR RADIOLOGIST EVAL & MGMT  08/28/2019  . IR RADIOLOGIST EVAL & MGMT  10/30/2019  . KNEE SURGERY  2007  . LUNG LOBECTOMY Right    middle lobe  . PORTACATH PLACEMENT Right 02/09/2017   Procedure: INSERTION PORT-A-CATH;  Surgeon: Nestor Lewandowsky, MD;  Location: [facility] ORS;  Service: General;  Laterality: Right;  . RADIOLOGY WITH ANESTHESIA N/A 10/02/2019   Procedure: MICROWAVE THERMA ABLATION;  Surgeon: Aletta Edouard, MD;  Location: [facility] ORS;  Service: Radiology;  Laterality: N/A;  . SALIVARY GLAND SURGERY Left 1983   with lymph node removal also  . SALIVARY GLAND SURGERY     salivary gland removal   . UPPER GI ENDOSCOPY  2008  . VIDEO BRONCHOSCOPY WITH ENDOBRONCHIAL ULTRASOUND Right 01/24/2017   Procedure: VIDEO BRONCHOSCOPY WITH ENDOBRONCHIAL ULTRASOUND;  Surgeon: [name], MD;  Location: [facility] ORS;  Service: Pulmonary;  Laterality: Right;    HEMATOLOGY/ONCOLOGY HISTORY:  Oncology History Overview Note  # July-AUG 2018- RECURRENT STAGE III Adeno Ca [EBUS; Right paratrach LN [Dr.Ram & Dr.Oaks] insuff tissue for testing; check liquid Bx]; PET- no distant mets.  # AUG 13th 2018- Carbo-Taxol with RT [finish oct 12th 2018]  ------------------------------------------------------------------------------   # MAY 2015- .Marland KitchenA right middle lobe adenocarcinoma s/p resection [Dr.Oaks].  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  may of 2015 4.Patient has finished 4 cycles of chemotherapy on January 23, 2014 Now  patient would be randomized   to  our [facility] protocol for Tarceva vs. placebo because of EGFR mutation 3.patient desires to get off protocol because of progressive side effect even after reducing dose.  (May 13, 2014) 5.Patient is starting  maintenance investigationally approach with Tarceva vs. placebo September of 2015. 6.Patient came off protocol therapy because of significant GI side effect May 13, 2014   Patient is nonsmoker;      Primary cancer of right middle lobe of lung ([place])    ALLERGIES:  is allergic to other; penicillins; and sulfa antibiotics.  MEDICATIONS:  Current Outpatient Medications  Medication Sig Dispense Refill  . albuterol (PROVENTIL HFA;VENTOLIN HFA) 108 (90 Base) MCG/ACT inhaler Inhale 1-2 puffs into the lungs every 6 (six) hours as needed for wheezing or shortness of breath.    Marland Kitchen ascorbic acid (VITAMIN C) 100 MG tablet Take 100 mg by mouth daily.     . B Complex Vitamins (VITAMIN B COMPLEX PO) Take 1 tablet by mouth daily.     . Biotin w/ Vitamins C & E (HAIR/SKIN/NAILS PO) Take 1 tablet by mouth daily.    . Calcium Carb-Cholecalciferol (CALCIUM 600+D) 600-800 MG-UNIT TABS Take 2 tablets by mouth daily.    . Cholecalciferol (D3 MAXIMUM STRENGTH) 5000 units capsule Take 5,000 Units by mouth daily.    . COLLAGEN PO Take 1,000 mg by mouth daily.    . cyclobenzaprine (FLEXERIL) 5 MG tablet Take 1 tablet (5 mg total) by mouth 3 (three) times daily as needed for muscle spasms. (Patient not taking: Reported on 11/21/2019) 30 tablet 0  . diclofenac Sodium (VOLTAREN) 1 % GEL Apply 2 g topically 4 (four) times daily as needed (pain).     . fexofenadine (ALLEGRA) 180 MG tablet Take 180 mg by mouth daily as needed for allergies.     . fluticasone (FLONASE) 50 MCG/ACT nasal spray Place 2 sprays into both nostrils daily as needed for allergies or rhinitis.    . Fluticasone-Salmeterol (ADVAIR) 250-50 MCG/DOSE AEPB Inhale 1 puff into the lungs 2 (two) times daily as  needed (for respiratory issues.).    Marland Kitchen lansoprazole (PREVACID) 30 MG capsule Take 30 mg by mouth daily.     Marland Kitchen lidocaine-prilocaine (EMLA) cream Apply 1 application topically as needed. Apply generously over the Mediport 45 minutes prior to chemotherapy. (Patient not taking: Reported on 09/24/2019) 30 g 0  . losartan (COZAAR) 25 MG tablet Take 25 mg by mouth at bedtime.     . metoCLOPramide (REGLAN) 10 MG tablet Take 1 tablet (10 mg total) by mouth every 8 (eight) hours as needed for nausea. (Patient not taking: Reported on 09/24/2019) 40 tablet 3  . ondansetron (ZOFRAN) 8 MG tablet Take 1 tablet (8 mg total) by mouth every 8 (eight) hours as needed for nausea or vomiting (start 3 days; after chemo). (Patient not taking: Reported on 09/24/2019) 40 tablet 1  . osimertinib mesylate (  TAGRISSO) 80 MG tablet Take 1 tablet (80 mg total) by mouth daily. 30 tablet 5  . polyethylene glycol (MIRALAX / GLYCOLAX) packet Take 17 g by mouth daily as needed for mild constipation.    Marland Kitchen PREMARIN 1.25 MG tablet Take 1.25 mg by mouth daily.   1  . prochlorperazine (COMPAZINE) 10 MG tablet TAKE 1 TABLET (10 MG TOTAL) BY MOUTH EVERY 6 (SIX) HOURS AS NEEDED FOR NAUSEA OR VOMITING. 40 tablet 1  . triamcinolone (KENALOG) 0.1 % paste Use as directed 1 application in the mouth or throat 2 (two) times daily. (Patient not taking: Reported on 09/24/2019) 5 g 12  . zinc gluconate 50 MG tablet Take 50 mg by mouth daily.     No current facility-administered medications for this visit.   Facility-Administered Medications Ordered in Other Visits  Medication Dose Route Frequency Provider Last Rate Last Admin  . heparin lock flush 100 unit/mL  500 Units Intravenous Once Lloyd Huger, MD      . sodium chloride flush (NS) 0.9 % injection 10 mL  10 mL Intravenous PRN Lloyd Huger, MD      . sodium chloride flush (NS) 0.9 % injection 10 mL  10 mL Intravenous PRN Lloyd Huger, MD   10 mL at 01/22/18 1419    VITAL  SIGNS: There were no vitals taken for this visit. There were no vitals filed for this visit.  Estimated body mass index is 23.63 kg/m as calculated from the following:   Height as of 10/03/19: '5\' 3"'  (1.6 m).   Weight as of an earlier encounter on 11/21/19: 60.5 kg (133 lb 6.4 oz).  LABS: CBC:    Component Value Date/Time   WBC 4.6 11/14/2019 1259   HGB 10.9 (L) 11/14/2019 1259   HGB 13.0 06/03/2014 1039   HCT 32.0 (L) 11/14/2019 1259   HCT 39.0 06/03/2014 1039   PLT 332 11/14/2019 1259   PLT 269 06/03/2014 1039   MCV 86.5 11/14/2019 1259   MCV 92 06/03/2014 1039   NEUTROABS 3.4 11/14/2019 1259   NEUTROABS 2.4 06/03/2014 1039   LYMPHSABS 0.6 (L) 11/14/2019 1259   LYMPHSABS 1.1 06/03/2014 1039   MONOABS 0.4 11/14/2019 1259   MONOABS 0.3 06/03/2014 1039   EOSABS 0.2 11/14/2019 1259   EOSABS 0.1 06/03/2014 1039   BASOSABS 0.0 11/14/2019 1259   BASOSABS 0.0 06/03/2014 1039   Comprehensive Metabolic Panel:    Component Value Date/Time   NA 131 (L) 11/14/2019 1259   NA 141 06/03/2014 1039   K 3.9 11/14/2019 1259   K 4.1 06/03/2014 1039   CL 99 11/14/2019 1259   CL 105 06/03/2014 1039   CO2 23 11/14/2019 1259   CO2 28 06/03/2014 1039   BUN 12 11/14/2019 1259   BUN 14 06/03/2014 1039   CREATININE 0.91 11/14/2019 1259   CREATININE 0.68 06/03/2014 1039   GLUCOSE 120 (H) 11/14/2019 1259   GLUCOSE 88 06/03/2014 1039   CALCIUM 8.6 (L) 11/14/2019 1259   CALCIUM 8.7 06/03/2014 1039   AST 30 11/14/2019 1259   AST 36 06/03/2014 1039   ALT 24 11/14/2019 1259   ALT 34 06/03/2014 1039   ALKPHOS 71 11/14/2019 1259   ALKPHOS 86 06/03/2014 1039   BILITOT 0.6 11/14/2019 1259   BILITOT 0.4 06/03/2014 1039   PROT 6.9 11/14/2019 1259   PROT 7.6 06/03/2014 1039   ALBUMIN 3.3 (L) 11/14/2019 1259   ALBUMIN 3.7 06/03/2014 1039    RADIOGRAPHIC STUDIES: CT ABDOMEN  PELVIS W WO CONTRAST  Result Date: 11/15/2019 CLINICAL DATA:  Follow-up right lung cancer, diagnosed 2015. Status post left  renal mass ablation in March 2021. EXAM: CT CHEST WITH CONTRAST CT ABDOMEN AND PELVIS WITH AND WITHOUT CONTRAST TECHNIQUE: Multidetector CT imaging of the chest was performed during intravenous contrast administration. Multidetector CT imaging of the abdomen and pelvis was performed following the standard protocol before and during bolus administration of intravenous contrast. CONTRAST:  1109m OMNIPAQUE IOHEXOL 300 MG/ML  SOLN COMPARISON:  CT abdomen dated 07/26/2019. PET-CT dated 07/09/2019. CT chest dated 06/20/2019. FINDINGS: CT CHEST FINDINGS Cardiovascular: The heart is normal in size. No pericardial effusion. No evidence of thoracic aortic aneurysm. Mild atherosclerotic calcifications of the aortic arch. Right chest port terminates in the lower SVC. Mediastinum/Nodes: No suspicious mediastinal lymphadenopathy. Visualized thyroid is unremarkable. Lungs/Pleura: Radiation changes in the medial right upper lobe/suprahilar region. Patchy subpleural ground-glass opacities, left lung predominant, suggesting atypical infection/pneumonia. Large right pleural effusion, mildly progressive. Mild irregular pleural thickening (axial image 79), correlate for pleural-based metastases. No pneumothorax. Musculoskeletal: Mild degenerative changes of the mid thoracic spine. CT ABDOMEN AND PELVIS FINDINGS Hepatobiliary: Subcentimeter cyst in the anterior right hepatic lobe (axial image 94). Gallbladder is unremarkable. No intrahepatic or extrahepatic ductal dilatation. Pancreas: Within normal limits. Spleen: Within normal limits. Adrenals/Urinary Tract: Adrenal glands are within normal limits. Status post left renal cryoablation in the posterior interpolar region (series 11/image 49). No associated arterial enhancement to suggest viable tumor. Scattered subcentimeter cysts bilaterally. No hydronephrosis. Bladder is within normal limits. Stomach/Bowel: Stomach is within normal limits. No evidence of bowel obstruction. Normal  appendix (series 11/image 108). Vascular/Lymphatic: No evidence of abdominal aortic aneurysm. Mild atherosclerotic calcifications the abdominal aorta. No suspicious abdominopelvic lymphadenopathy. Reproductive: Status post hysterectomy. Left bilateral ovaries are unremarkable. Other: No abdominopelvic ascites. Musculoskeletal: Mild degenerative changes of the lumbar spine. IMPRESSION: Radiation changes in the right upper lobe/suprahilar region. Large right pleural effusion, mildly progressive. Mild irregular right pleural nodularity, correlate for pleural-based metastases. Status post left renal cryoablation. No enhancement to suggest residual/viable tumor. Patchy subpleural ground-glass opacities, left lung predominant, suggesting atypical infection/pneumonia. Electronically Signed   By: SJulian HyM.D.   On: 11/15/2019 09:46   CT Chest W Contrast  Result Date: 11/15/2019 CLINICAL DATA:  Follow-up right lung cancer, diagnosed 2015. Status post left renal mass ablation in March 2021. EXAM: CT CHEST WITH CONTRAST CT ABDOMEN AND PELVIS WITH AND WITHOUT CONTRAST TECHNIQUE: Multidetector CT imaging of the chest was performed during intravenous contrast administration. Multidetector CT imaging of the abdomen and pelvis was performed following the standard protocol before and during bolus administration of intravenous contrast. CONTRAST:  1094mOMNIPAQUE IOHEXOL 300 MG/ML  SOLN COMPARISON:  CT abdomen dated 07/26/2019. PET-CT dated 07/09/2019. CT chest dated 06/20/2019. FINDINGS: CT CHEST FINDINGS Cardiovascular: The heart is normal in size. No pericardial effusion. No evidence of thoracic aortic aneurysm. Mild atherosclerotic calcifications of the aortic arch. Right chest port terminates in the lower SVC. Mediastinum/Nodes: No suspicious mediastinal lymphadenopathy. Visualized thyroid is unremarkable. Lungs/Pleura: Radiation changes in the medial right upper lobe/suprahilar region. Patchy subpleural  ground-glass opacities, left lung predominant, suggesting atypical infection/pneumonia. Large right pleural effusion, mildly progressive. Mild irregular pleural thickening (axial image 79), correlate for pleural-based metastases. No pneumothorax. Musculoskeletal: Mild degenerative changes of the mid thoracic spine. CT ABDOMEN AND PELVIS FINDINGS Hepatobiliary: Subcentimeter cyst in the anterior right hepatic lobe (axial image 94). Gallbladder is unremarkable. No intrahepatic or extrahepatic ductal dilatation. Pancreas: Within normal limits. Spleen: Within  normal limits. Adrenals/Urinary Tract: Adrenal glands are within normal limits. Status post left renal cryoablation in the posterior interpolar region (series 11/image 49). No associated arterial enhancement to suggest viable tumor. Scattered subcentimeter cysts bilaterally. No hydronephrosis. Bladder is within normal limits. Stomach/Bowel: Stomach is within normal limits. No evidence of bowel obstruction. Normal appendix (series 11/image 108). Vascular/Lymphatic: No evidence of abdominal aortic aneurysm. Mild atherosclerotic calcifications the abdominal aorta. No suspicious abdominopelvic lymphadenopathy. Reproductive: Status post hysterectomy. Left bilateral ovaries are unremarkable. Other: No abdominopelvic ascites. Musculoskeletal: Mild degenerative changes of the lumbar spine. IMPRESSION: Radiation changes in the right upper lobe/suprahilar region. Large right pleural effusion, mildly progressive. Mild irregular right pleural nodularity, correlate for pleural-based metastases. Status post left renal cryoablation. No enhancement to suggest residual/viable tumor. Patchy subpleural ground-glass opacities, left lung predominant, suggesting atypical infection/pneumonia. Electronically Signed   By: Julian Hy M.D.   On: 11/15/2019 09:46   IR Radiologist Eval & Mgmt  Result Date: 10/30/2019 Please refer to notes tab for details about interventional  procedure. (Op Note)    Assessment and Plan-  Patient to continue on osimertinib   Oral Chemotherapy Side Effect/Intolerance: Patient did not report any intolerable side effects. She has noticed that he hair is more brittle and her nail beds are occasionally sore. She also has a mouth ulcer a couple times a week. She reported that the triamcinolone paste prescribed to her was expensive but the found an alterative that works similarly but does not start as well on the mouth sores.   I had Bethena Roys, patient advocate, look at the triamcinolone paste cost and it appears that some of the cost of the paste is going towards the patients insurance deductible.   Oral Chemotherapy Adherence: Patient reported no missed doses this month  Medication Access Issues: Patient enrolled in [employer] manufacturer assistance. She reports no recent issue with medication access but when she moved the medication was sent to the wrong address, the program resent her medication and the issue was corrected. I refill for Tagrisso was sent to [employer].  Patient expressed understanding and was in agreement with this plan. She also understands that She can call clinic at any time with any questions, concerns, or complaints.   Thank you for allowing me to participate in the care of this very pleasant patient.   Time Total: 15 mins  Visit consisted of counseling and education on dealing with issues of symptom management in the setting of serious and potentially life-threatening illness.Greater than 50%  of this time was spent counseling and coordinating care related to the above assessment and plan.  Signed by: Darl Pikes, PharmD, BCPS, Salley Slaughter, CPP Hematology/Oncology Clinical Pharmacist Practitioner [facility] [phone]  11/21/2019 3:11 PM

## 2019-11-24 ENCOUNTER — Encounter: Payer: Self-pay | Admitting: Oncology

## 2019-11-25 ENCOUNTER — Other Ambulatory Visit: Payer: Medicare Other

## 2019-11-25 ENCOUNTER — Other Ambulatory Visit: Payer: Self-pay | Admitting: Emergency Medicine

## 2019-11-25 DIAGNOSIS — C3490 Malignant neoplasm of unspecified part of unspecified bronchus or lung: Secondary | ICD-10-CM

## 2019-11-25 MED ORDER — TRIAMCINOLONE ACETONIDE 0.1 % MT PSTE: 1.0000 "application " | PASTE | Freq: Two times a day (BID) | OROMUCOSAL | 12 refills | Status: AC

## 2019-11-25 MED ORDER — TRIAMCINOLONE ACETONIDE 0.1 % MT PSTE: 1.0000 "application " | PASTE | Freq: Two times a day (BID) | OROMUCOSAL | 12 refills | Status: DC

## 2019-11-25 MED ORDER — OSIMERTINIB MESYLATE 80 MG PO TABS: 80.0000 mg | ORAL_TABLET | Freq: Every day | ORAL | 5 refills | Status: DC

## 2019-11-25 MED ORDER — MAGIC MOUTHWASH: 5.0000 mL | Freq: Four times a day (QID) | ORAL | 0 refills | Status: AC | PRN

## 2019-11-26 ENCOUNTER — Ambulatory Visit
Admission: RE | Admit: 2019-11-26 | Discharge: 2019-11-26 | Disposition: A | Discharge status: Home / Self Care | Payer: Medicare Other | Source: Ambulatory Visit | Attending: Interventional Radiology | Admitting: Interventional Radiology

## 2019-11-26 ENCOUNTER — Other Ambulatory Visit: Payer: Medicare Other

## 2019-11-26 ENCOUNTER — Other Ambulatory Visit: Payer: Self-pay

## 2019-11-26 ENCOUNTER — Encounter: Payer: Self-pay | Admitting: *Deleted

## 2019-11-26 DIAGNOSIS — N2889 Other specified disorders of kidney and ureter: Secondary | ICD-10-CM

## 2019-11-26 HISTORY — PX: IR RADIOLOGIST EVAL & MGMT: IMG5224

## 2019-11-26 NOTE — Progress Notes (Signed)
Chief Complaint: Patient was consulted remotely today (TeleHealth) for follow up after left renal ablation.  History of Present Illness: Lauren Page is a 69 y.o. 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  She had restaging imaging performed on 11/14/2019.  Recently she was not feeling well with shortness of breath and cough.  Due to evidence of atypical infection/pneumonia on the restaging CT, especially in the left lung, she was placed on Levaquin by Dr. Doy Hutching and feels improved.  She does state that she still is not quite back to normal.  She has had COVID vaccination.  She is scheduled for an ultrasound-guided right thoracentesis next week on 12/04/2019.  She is then scheduled to leave for [place] shortly after that for a little more than a week prior to having to return for her husband to undergo esophagectomy at [facility] in mid June.  Past Medical History:  Diagnosis Date  . Abnormal echocardiogram   . Acid reflux   . Allergic rhinitis   . Anemia    HAD TO HAVE IRON INFUSIONS BACK IN 2015  . Arthritis    bil hands and back  . Asthma   . Bloody discharge from left nipple   . Breast mass    bilateral   . Cancer of right lung ([place]) 2015   lung cancer - chemo, Dr Genevive Bi removed RML Lobectomy.   . Colon polyps   . COPD (chronic obstructive pulmonary disease) ([place])   . Dyspnea    WITH EXERTION  . Family history of breast cancer   . Family history of cervical cancer   . Family history of colon cancer   . Family history of skin cancer   . Hypertension   . Malignant neoplasm of middle lobe of right lung ([place])   . Mitral valve prolapse   . Mitral valve prolapse   . Non-small cell lung cancer ([place])   . Personal history of chemotherapy    F/U Lung Cancer  . Personal history of radiation therapy    Lung cancer  . Primary cancer of right middle lobe of lung ([place]) 03/11/2016  . Squamous cell carcinoma of skin  07/07/2009   right supraclavicular/EDC  . Stage IV adenocarcinoma of lung, unspecified laterality [facility])     Past Surgical History:  Procedure Laterality Date  . ABDOMINAL HYSTERECTOMY    . BAND HEMORRHOIDECTOMY    . BREAST BIOPSY Left 03/01/2019   Procedure: LEFT BREAST EXCISIONAL BIOPSY WITH NEEDLE LOCALIZATION;  Surgeon: Herbert Pun, MD;  Location: [facility] ORS;  Service: General;  Laterality: Left;  . BREAST EXCISIONAL BIOPSY Left 1994 (-), 2004 (-)   benign  . BREAST EXCISIONAL BIOPSY Left 03/01/2019   path pending x 2  . COLONOSCOPY  2008    Dr. Donnella Sham  . COLONOSCOPY WITH PROPOFOL N/A 04/12/2019   Procedure: COLONOSCOPY WITH PROPOFOL;  Surgeon: Lollie Sails, MD;  Location: [facility] ENDOSCOPY;  Service: Endoscopy;  Laterality: N/A;  . IR RADIOLOGIST EVAL & MGMT  08/28/2019  . IR RADIOLOGIST EVAL & MGMT  10/30/2019  . KNEE SURGERY  2007  . LUNG LOBECTOMY Right    middle lobe  . PORTACATH PLACEMENT Right 02/09/2017   Procedure: INSERTION PORT-A-CATH;  Surgeon: Nestor Lewandowsky, MD;  Location: [facility] ORS;  Service: General;  Laterality: Right;  . RADIOLOGY WITH ANESTHESIA N/A 10/02/2019   Procedure: MICROWAVE THERMA ABLATION;  Surgeon: Aletta Edouard, MD;  Location: [facility] ORS;  Service: Radiology;  Laterality: N/A;  . SALIVARY GLAND SURGERY Left 1983   with lymph node removal also  . SALIVARY GLAND SURGERY     salivary gland removal   . UPPER GI ENDOSCOPY  2008  . VIDEO BRONCHOSCOPY WITH ENDOBRONCHIAL ULTRASOUND Right 01/24/2017   Procedure: VIDEO BRONCHOSCOPY WITH ENDOBRONCHIAL ULTRASOUND;  Surgeon: [name], MD;  Location: [facility] ORS;  Service: Pulmonary;  Laterality: Right;    Allergies: Other, Penicillins, and Sulfa antibiotics  Medications: Prior to Admission medications   Medication Sig Start Date End Date Taking? Authorizing Provider  albuterol (PROVENTIL HFA;VENTOLIN HFA) 108 (90 Base) MCG/ACT inhaler Inhale 1-2 puffs into the lungs every 6 (six) hours as  needed for wheezing or shortness of breath.    [provider]  ascorbic acid (VITAMIN C) 100 MG tablet Take 100 mg by mouth daily.     [provider]  B Complex Vitamins (VITAMIN B COMPLEX PO) Take 1 tablet by mouth daily.     [provider]  Biotin w/ Vitamins C & E (HAIR/SKIN/NAILS PO) Take 1 tablet by mouth daily.    [provider]  Calcium Carb-Cholecalciferol (CALCIUM 600+D) 600-800 MG-UNIT TABS Take 2 tablets by mouth daily.    [provider]  Cholecalciferol (D3 MAXIMUM STRENGTH) 5000 units capsule Take 5,000 Units by mouth daily.    [provider]  COLLAGEN PO Take 1,000 mg by mouth daily.    [provider]  cyclobenzaprine (FLEXERIL) 5 MG tablet Take 1 tablet (5 mg total) by mouth 3 (three) times daily as needed for muscle spasms. Patient not taking: Reported on 11/21/2019 08/20/19   Lloyd Huger, MD  diclofenac Sodium (VOLTAREN) 1 % GEL Apply 2 g topically 4 (four) times daily as needed (pain).  07/08/19 07/07/20  [provider]  fexofenadine (ALLEGRA) 180 MG tablet Take 180 mg by mouth daily as needed for allergies.     [provider]  fluticasone (FLONASE) 50 MCG/ACT nasal spray Place 2 sprays into both nostrils daily as needed for allergies or rhinitis.    [provider]  Fluticasone-Salmeterol (ADVAIR) 250-50 MCG/DOSE AEPB Inhale 1 puff into the lungs 2 (two) times daily as needed (for respiratory issues.).    [provider]  lansoprazole (PREVACID) 30 MG capsule Take 30 mg by mouth daily.     [provider]  lidocaine-prilocaine (EMLA) cream Apply 1 application topically as needed. Apply generously over the Mediport 45 minutes prior to chemotherapy. Patient not taking: Reported on 09/24/2019 02/13/17   Cammie Sickle, MD  losartan (COZAAR) 25 MG tablet Take 25 mg by mouth at bedtime.  12/08/16   [provider]  magic mouthwash SOLN Take 5-10 mLs by  mouth 4 (four) times daily as needed for mouth pain. 11/25/19   Lloyd Huger, MD  ondansetron (ZOFRAN) 8 MG tablet Take 1 tablet (8 mg total) by mouth every 8 (eight) hours as needed for nausea or vomiting (start 3 days; after chemo). Patient not taking: Reported on 09/24/2019 02/13/17   Cammie Sickle, MD  osimertinib mesylate (TAGRISSO) 80 MG tablet Take 1 tablet (80 mg total) by mouth daily. 11/25/19   Darl Pikes, RPH-CPP  polyethylene glycol (MIRALAX / GLYCOLAX) packet Take 17 g by mouth daily as needed for mild constipation.    [provider]  PREMARIN 1.25 MG tablet Take 1.25 mg by mouth daily.  08/02/17   [provider]  prochlorperazine (COMPAZINE) 10 MG tablet TAKE 1  TABLET (10 MG TOTAL) BY MOUTH EVERY 6 (SIX) HOURS AS NEEDED FOR NAUSEA OR VOMITING. 10/07/19   Lloyd Huger, MD  triamcinolone (KENALOG) 0.1 % paste Use as directed 1 application in the mouth or throat 2 (two) times daily. 11/25/19   Lloyd Huger, MD  zinc gluconate 50 MG tablet Take 50 mg by mouth daily.    [provider]     Family History  Problem Relation Age of Onset  . Breast cancer Maternal Aunt 60  . Alzheimer's disease Maternal Aunt   . Breast cancer Paternal Aunt        dx. in her late 78s  . Breast cancer Maternal Grandmother        dx. in her 92s  . Cervical cancer Maternal Grandmother 60  . Colon cancer Maternal Grandmother        dx. in her 76s  . Breast cancer Maternal Aunt 60  . Alzheimer's disease Maternal Aunt   . Breast cancer Paternal Aunt        dx. in her late 32s  . Heart disease Paternal Aunt   . Healthy Mother   . Healthy Father   . Colon cancer Father 2  . Skin cancer Father   . Heart attack Father   . Heart attack Paternal Grandfather   . Skin cancer Paternal Aunt   . Heart attack Paternal Uncle 19  . Heart attack Paternal Uncle     Social History   Socioeconomic History  . Marital status: Married    Spouse name: Not on  file  . Number of children: Not on file  . Years of education: Not on file  . Highest education level: Not on file  Occupational History  . Not on file  Tobacco Use  . Smoking status: Never Smoker  . Smokeless tobacco: Never Used  Substance and Sexual Activity  . Alcohol use: Yes    Alcohol/week: 7.0 standard drinks    Types: 7 Glasses of wine per week  . Drug use: No  . Sexual activity: Not on file  Other Topics Concern  . Not on file  Social History Narrative  . Not on file   Social Determinants of Health   Financial Resource Strain:   . Difficulty of Paying Living Expenses:   Food Insecurity:   . Worried About Charity fundraiser in the Last Year:   . Arboriculturist in the Last Year:   Transportation Needs:   . Film/video editor (Medical):   Marland Kitchen Lack of Transportation (Non-Medical):   Physical Activity:   . Days of Exercise per Week:   . Minutes of Exercise per Session:   Stress:   . Feeling of Stress :   Social Connections:   . Frequency of Communication with Friends and Family:   . Frequency of Social Gatherings with Friends and Family:   . Attends Religious Services:   . Active Member of Clubs or Organizations:   . Attends Archivist Meetings:   Marland Kitchen Marital Status:     ECOG Status: 1 - Symptomatic but completely ambulatory  Review of Systems  Constitutional: Positive for fatigue.  Respiratory: Positive for cough and shortness of breath.   Cardiovascular: Negative.   Gastrointestinal: Negative.   Genitourinary: Negative.   Musculoskeletal: Negative.   Neurological: Negative.     Review of Systems: A 12 point ROS discussed and pertinent positives are indicated in the HPI above.  All other systems are negative.  Physical Exam No direct physical exam was performed (except for noted visual exam findings with Video Visits).    Vital Signs: There were no vitals taken for this visit.  Imaging: CT ABDOMEN PELVIS W WO CONTRAST  Result Date:  11/15/2019 CLINICAL DATA:  Follow-up right lung cancer, diagnosed 2015. Status post left renal mass ablation in March 2021. EXAM: CT CHEST WITH CONTRAST CT ABDOMEN AND PELVIS WITH AND WITHOUT CONTRAST TECHNIQUE: Multidetector CT imaging of the chest was performed during intravenous contrast administration. Multidetector CT imaging of the abdomen and pelvis was performed following the standard protocol before and during bolus administration of intravenous contrast. CONTRAST:  125mL OMNIPAQUE IOHEXOL 300 MG/ML  SOLN COMPARISON:  CT abdomen dated 07/26/2019. PET-CT dated 07/09/2019. CT chest dated 06/20/2019. FINDINGS: CT CHEST FINDINGS Cardiovascular: The heart is normal in size. No pericardial effusion. No evidence of thoracic aortic aneurysm. Mild atherosclerotic calcifications of the aortic arch. Right chest port terminates in the lower SVC. Mediastinum/Nodes: No suspicious mediastinal lymphadenopathy. Visualized thyroid is unremarkable. Lungs/Pleura: Radiation changes in the medial right upper lobe/suprahilar region. Patchy subpleural ground-glass opacities, left lung predominant, suggesting atypical infection/pneumonia. Large right pleural effusion, mildly progressive. Mild irregular pleural thickening (axial image 79), correlate for pleural-based metastases. No pneumothorax. Musculoskeletal: Mild degenerative changes of the mid thoracic spine. CT ABDOMEN AND PELVIS FINDINGS Hepatobiliary: Subcentimeter cyst in the anterior right hepatic lobe (axial image 94). Gallbladder is unremarkable. No intrahepatic or extrahepatic ductal dilatation. Pancreas: Within normal limits. Spleen: Within normal limits. Adrenals/Urinary Tract: Adrenal glands are within normal limits. Status post left renal cryoablation in the posterior interpolar region (series 11/image 49). No associated arterial enhancement to suggest viable tumor. Scattered subcentimeter cysts bilaterally. No hydronephrosis. Bladder is within normal limits.  Stomach/Bowel: Stomach is within normal limits. No evidence of bowel obstruction. Normal appendix (series 11/image 108). Vascular/Lymphatic: No evidence of abdominal aortic aneurysm. Mild atherosclerotic calcifications the abdominal aorta. No suspicious abdominopelvic lymphadenopathy. Reproductive: Status post hysterectomy. Left bilateral ovaries are unremarkable. Other: No abdominopelvic ascites. Musculoskeletal: Mild degenerative changes of the lumbar spine. IMPRESSION: Radiation changes in the right upper lobe/suprahilar region. Large right pleural effusion, mildly progressive. Mild irregular right pleural nodularity, correlate for pleural-based metastases. Status post left renal cryoablation. No enhancement to suggest residual/viable tumor. Patchy subpleural ground-glass opacities, left lung predominant, suggesting atypical infection/pneumonia. Electronically Signed   By: Julian Hy M.D.   On: 11/15/2019 09:46   CT Chest W Contrast  Result Date: 11/15/2019 CLINICAL DATA:  Follow-up right lung cancer, diagnosed 2015. Status post left renal mass ablation in March 2021. EXAM: CT CHEST WITH CONTRAST CT ABDOMEN AND PELVIS WITH AND WITHOUT CONTRAST TECHNIQUE: Multidetector CT imaging of the chest was performed during intravenous contrast administration. Multidetector CT imaging of the abdomen and pelvis was performed following the standard protocol before and during bolus administration of intravenous contrast. CONTRAST:  151mL OMNIPAQUE IOHEXOL 300 MG/ML  SOLN COMPARISON:  CT abdomen dated 07/26/2019. PET-CT dated 07/09/2019. CT chest dated 06/20/2019. FINDINGS: CT CHEST FINDINGS Cardiovascular: The heart is normal in size. No pericardial effusion. No evidence of thoracic aortic aneurysm. Mild atherosclerotic calcifications of the aortic arch. Right chest port terminates in the lower SVC. Mediastinum/Nodes: No suspicious mediastinal lymphadenopathy. Visualized thyroid is unremarkable. Lungs/Pleura:  Radiation changes in the medial right upper lobe/suprahilar region. Patchy subpleural ground-glass opacities, left lung predominant, suggesting atypical infection/pneumonia. Large right pleural effusion, mildly progressive. Mild irregular pleural thickening (axial image 79), correlate for pleural-based metastases. No pneumothorax. Musculoskeletal: Mild degenerative changes of the mid  thoracic spine. CT ABDOMEN AND PELVIS FINDINGS Hepatobiliary: Subcentimeter cyst in the anterior right hepatic lobe (axial image 94). Gallbladder is unremarkable. No intrahepatic or extrahepatic ductal dilatation. Pancreas: Within normal limits. Spleen: Within normal limits. Adrenals/Urinary Tract: Adrenal glands are within normal limits. Status post left renal cryoablation in the posterior interpolar region (series 11/image 49). No associated arterial enhancement to suggest viable tumor. Scattered subcentimeter cysts bilaterally. No hydronephrosis. Bladder is within normal limits. Stomach/Bowel: Stomach is within normal limits. No evidence of bowel obstruction. Normal appendix (series 11/image 108). Vascular/Lymphatic: No evidence of abdominal aortic aneurysm. Mild atherosclerotic calcifications the abdominal aorta. No suspicious abdominopelvic lymphadenopathy. Reproductive: Status post hysterectomy. Left bilateral ovaries are unremarkable. Other: No abdominopelvic ascites. Musculoskeletal: Mild degenerative changes of the lumbar spine. IMPRESSION: Radiation changes in the right upper lobe/suprahilar region. Large right pleural effusion, mildly progressive. Mild irregular right pleural nodularity, correlate for pleural-based metastases. Status post left renal cryoablation. No enhancement to suggest residual/viable tumor. Patchy subpleural ground-glass opacities, left lung predominant, suggesting atypical infection/pneumonia. Electronically Signed   By: Julian Hy M.D.   On: 11/15/2019 09:46   IR Radiologist Eval &  Mgmt  Result Date: 10/30/2019 Please refer to notes tab for details about interventional procedure. (Op Note)   Labs:  CBC: Recent Labs    06/20/19 0958 08/15/19 0940 10/02/19 1030 11/14/19 1259  WBC 3.2* 3.1* 4.1 4.6  HGB 11.2* 12.0 11.8* 10.9*  HCT 34.4* 35.6* 36.0 32.0*  PLT 164 186 222 332    COAGS: Recent Labs    04/12/19 1307 08/15/19 0940 10/02/19 1030  INR 1.0 0.9 1.0    BMP: Recent Labs    03/19/19 1142 06/20/19 0958 10/02/19 1030 11/14/19 1259  NA 132* 136 139 131*  K 4.3 4.0 3.8 3.9  CL 99 104 106 99  CO2 24 25 24 23   GLUCOSE 98 94 98 120*  BUN 19 16 16 12   CALCIUM 8.9 8.6* 9.1 8.6*  CREATININE 0.80 0.87 0.90 0.91  GFRNONAA >60 >60 >60 >60  GFRAA >60 >60 >60 >60    LIVER FUNCTION TESTS: Recent Labs    01/28/19 1157 03/19/19 1142 06/20/19 0958 11/14/19 1259  BILITOT 0.5 0.7 0.6 0.6  AST 34 28 30 30   ALT 29 21 25 24   ALKPHOS 64 57 56 71  PROT 6.5 7.3 6.4* 6.9  ALBUMIN 3.5 3.7 3.5 3.3*     Assessment and Plan:  I reviewed imaging findings with Lauren Page.  The initial CT appearance of the ablation defect at the level of the posterior left renal metastasis appears appropriately treated with a larger zone of ablation compared to the lesion and no evidence of contrast enhancement.  There is no evidence of complication following the procedure.  No new metastatic disease is identified in the abdomen.  Right pleural effusion appears larger in volume compared to prior imaging.  There is suggestion of potentially pleural enhancement and nodularity suggesting underlying pleural metastatic disease.  New ground glass pulmonary infiltrates are present in both lungs, left greater than right, with a peripheral predominance.  This pattern is suggestive of potential COVID-19 pneumonia despite prior vaccination.  A COVID-19 test will have to be performed prior to thoracentesis next week due to aerosolizing nature of that procedure.  We will plan to proceed  with thoracentesis next Wednesday which may help with some of her symptoms of dyspnea and cough.  Electronically Signed: Azzie Roup 11/26/2019, 2:39 PM     I spent a total of 15  Minutes in remote  clinical consultation, greater than 50% of which was counseling/coordinating care post ablation of a left renal metastasis.    Visit type: Audio and video Neurosurgeon).   Alternative for in-person consultation at [facility], [address] [place], [place], [place]. This visit type was conducted due to national recommendations for restrictions regarding the COVID-19 Pandemic (e.g. social distancing).  This format is felt to be most appropriate for this patient at this time.  All issues noted in this document were discussed and addressed.

## 2019-11-27 ENCOUNTER — Ambulatory Visit: Payer: Medicare Other

## 2019-12-03 ENCOUNTER — Other Ambulatory Visit
Admission: RE | Admit: 2019-12-03 | Discharge: 2019-12-03 | Disposition: A | Discharge status: Home / Self Care | Payer: Medicare Other | Source: Ambulatory Visit | Attending: Oncology | Admitting: Oncology

## 2019-12-03 ENCOUNTER — Other Ambulatory Visit: Payer: Self-pay

## 2019-12-03 DIAGNOSIS — Z20822 Contact with and (suspected) exposure to covid-19: Secondary | ICD-10-CM | POA: Diagnosis not present

## 2019-12-03 DIAGNOSIS — Z01812 Encounter for preprocedural laboratory examination: Secondary | ICD-10-CM | POA: Insufficient documentation

## 2019-12-04 ENCOUNTER — Ambulatory Visit
Admission: RE | Admit: 2019-12-04 | Discharge: 2019-12-04 | Disposition: A | Discharge status: Home / Self Care | Payer: Medicare Other | Source: Ambulatory Visit | Attending: Interventional Radiology | Admitting: Interventional Radiology

## 2019-12-04 ENCOUNTER — Ambulatory Visit
Admission: RE | Admit: 2019-12-04 | Discharge: 2019-12-04 | Disposition: A | Discharge status: Home / Self Care | Payer: Medicare Other | Source: Ambulatory Visit | Attending: Oncology | Admitting: Oncology

## 2019-12-04 DIAGNOSIS — C7900 Secondary malignant neoplasm of unspecified kidney and renal pelvis: Secondary | ICD-10-CM | POA: Insufficient documentation

## 2019-12-04 DIAGNOSIS — C3490 Malignant neoplasm of unspecified part of unspecified bronchus or lung: Secondary | ICD-10-CM | POA: Diagnosis not present

## 2019-12-04 DIAGNOSIS — J90 Pleural effusion, not elsewhere classified: Secondary | ICD-10-CM | POA: Diagnosis present

## 2019-12-04 DIAGNOSIS — Z9889 Other specified postprocedural states: Secondary | ICD-10-CM

## 2019-12-04 LAB — SARS CORONAVIRUS 2 (TAT 6-24 HRS): SARS Coronavirus 2: NEGATIVE

## 2019-12-04 NOTE — Procedures (Signed)
Interventional Radiology Procedure Note  Procedure: US guided right thoracentesis  Complications: None  Estimated Blood Loss: None  Findings: 1.3 L of amber fluid removed from right pleural space. Post CXR pending.  Venetia Night. Kathlene Cote, M.D Pager:  [phone]

## 2019-12-05 ENCOUNTER — Inpatient Hospital Stay: Payer: Medicare Other

## 2019-12-30 ENCOUNTER — Telehealth: Payer: Self-pay | Admitting: Cardiothoracic Surgery

## 2019-12-30 NOTE — Telephone Encounter (Signed)
Patient is calling and is asking if you would give her a call, did not say what she would like to discuss. Please call patient and advise.

## 2020-01-13 NOTE — Telephone Encounter (Signed)
Hey,Has this been taken care of, didn't see a phone message added to my call or any new phone messages. Thank you

## 2020-01-20 ENCOUNTER — Other Ambulatory Visit: Payer: Self-pay

## 2020-01-20 ENCOUNTER — Ambulatory Visit: Payer: Medicare Other | Attending: Ophthalmology | Admitting: Ophthalmology

## 2020-01-20 ENCOUNTER — Ambulatory Visit (HOSPITAL_COMMUNITY): Payer: Medicare Other

## 2020-01-20 ENCOUNTER — Ambulatory Visit (INDEPENDENT_AMBULATORY_CARE_PROVIDER_SITE_OTHER): Payer: Medicare Other

## 2020-01-20 ENCOUNTER — Encounter (INDEPENDENT_AMBULATORY_CARE_PROVIDER_SITE_OTHER): Payer: Self-pay | Admitting: Ophthalmology

## 2020-01-20 DIAGNOSIS — H35372 Puckering of macula, left eye: Secondary | ICD-10-CM

## 2020-01-20 DIAGNOSIS — H029 Unspecified disorder of eyelid: Secondary | ICD-10-CM | POA: Insufficient documentation

## 2020-01-20 DIAGNOSIS — D2310 Other benign neoplasm of skin of unspecified eyelid, including canthus: Secondary | ICD-10-CM

## 2020-01-20 DIAGNOSIS — Z87891 Personal history of nicotine dependence: Secondary | ICD-10-CM | POA: Insufficient documentation

## 2020-01-20 DIAGNOSIS — H2513 Age-related nuclear cataract, bilateral: Secondary | ICD-10-CM | POA: Insufficient documentation

## 2020-01-20 DIAGNOSIS — D22111 Melanocytic nevi of right upper eyelid, including canthus: Secondary | ICD-10-CM | POA: Insufficient documentation

## 2020-01-20 NOTE — Progress Notes (Addendum)
[facility] [place], [place]  [place] [address]  Operated by [facility] Note    01/20/2020    Patient Name: Carrie Schmidt    Date of Birth:  June 01, 1951    Referring Provider:  Blaine Hamper, [place],  [place] [address]           HPI     Dr. Carrolyn Meiers referred for papilloma eval of the right outer canthus. 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 Vision OU seems OK. Does have ERM OS.    Last edited by Sallee Lange, [place] on 01/20/2020  3:27 PM. (History)          ROS     Positive for: Eyes ( Papilloma OD)    Negative for: Constitutional, Gastrointestinal, Neurological, Skin, Genitourinary, Musculoskeletal, HENT, Endocrine, Cardiovascular, Respiratory, Psychiatric, Allergic/Imm, Heme/Lymph    Last edited by Sallee Lange, [place] on 01/20/2020  3:27 PM. (History)          History of Sun Exposure:yes If yes: skin cancer  Excessive sun burn: yes   History radiation to head: No  History of skin cancer:  yes      Past Ocular history:   Past Surgical History:   Procedure Laterality Date    CESAREAN SECTION      X2    FOOT FRACTURE SURGERY      KNEE ARTHROSCOPY Left          Sallee Lange, COA 01/20/2020, 15:44    MD Addition to HPI:    The patient is a 69 y.o. 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 Lesion of right upper eyelid  H02.9    3. Age-related nuclear cataract of both eyes  H25.13    4. Epiretinal membrane (ERM) of left eye  H35.372        Plan(s)/Recommendation(s):    RUL lesions with growth and irritation  Photo  Review r/b/a of excision and pt  elects to proceed  E-mycin TID x 1 week    [place] OU  Not visually significant  Continue current Rx  F/U with Dr. Geoffry Paradise    ERM OS  Mild   Observe    HTN without retinopathy  BP control per PCP  Due to the unprecedented pandemic (COVID-19), the patient was counseled on RBA of treatment. Discussed proper hygiene, hand washing, social distancing. CDC guidelines were followed. Pt was told signs and symptoms of COVID-19, and was told to contact me or the Emergency Department if any appear.    Imaging (CT/MRI) Reviewed:   Results Reviewed:  Reviewed Dr. Carrolyn Meiers record         I saw and examined the patient.  I reviewed the technician/resident's note.  I agree with the findings and plan of care as documented in the resident's note.  Any exceptions/additions are edited/noted.    [name], MD 01/20/2020  15:44      Eye Examination:     Neuro/Psych     Oriented x3: Yes    Mood/Affect: Normal        Visual Acuity     Visual Acuity (Snellen - Linear)       Right Left    Dist cc 20/20 +2 20/20 -1 +2          Edited by: Sallee Lange, COA            Wearing Rx     Wearing Rx       Sphere Cylinder Axis Add    Right -2.50 +0.50 157 +2.50    Left -2.00 +0.25 055 +2.50    Age: 1m          Edited by: Sallee Lange, COA             Not recorded         Not recorded        Confrontational Visual Fields     Visual Fields       Right Left     Full Full          Edited by: Sallee Lange, COA            Pupils       Pupils APD    Right PERRL None    Left PERRL None        Extraocular Movement     Extraocular Movement       Right Left     Ortho Ortho     -- -- --   --  --   -- -- --    -- -- --   --  --   -- -- --             Edited by: Sallee Lange, COA                Lid edema TW:SFKC OS: None Conj chem OD: None LE:XNTZ   Lid inject OD: None OS: None Conj inject OD: None OS: None             PF   MRD                       LF   LC                       USS   LSS                    LAG               Main Ophthalmology Exam     External  Exam       Right Left    External Normal Normal          Slit Lamp Exam       Right Left    Lids/Lashes Dermatochalasis - upper lid, 5x35mm margin lesion RUL and 4x45mm medial RUL Dermatochalasis - upper lid    Conjunctiva/Sclera White and quiet White and quiet    Cornea Clear Clear    Anterior Chamber Deep and quiet Deep and quiet    Iris Round and reactive Round and reactive    Lens Nuclear sclerosis Nuclear sclerosis          Fundus Exam       Right Left    Vitreous Normal Normal    Disc Normal Normal  Macula Normal Membrane    Vessels Normal Normal                  IOP straight Tonometry     Tonometry (Tonopen, 3:41 PM)       Right Left    Pressure 13 14          Edited by: Sallee Lange, COA                Upgaze     5 down      Past Medical / Surgical / Family / Social History:      has a past medical history of Arthropathy, Cancer (CMS [place]), and HTN (hypertension).    Past Surgical History:   Procedure Laterality Date    CESAREAN SECTION      X2    FOOT FRACTURE SURGERY      KNEE ARTHROSCOPY Left            Family Medical History:     Problem Relation (Age of Onset)    Cancer Maternal Grandmother    Cataract Mother, Father    Diabetes Mother              Social History     Tobacco Use    Smoking status: Former Smoker    Smokeless tobacco: Never Used   Substance Use Topics    Alcohol use: Not on file     Comment: PRN    Drug use: Not on file       I have seen and examined the above patient. I discussed the above diagnoses listed in the assessment and the above ophthalmic plan of care with the patient and patient's family. All questions were answered. I reviewed and, when necessary, made changes to the technician/resident note, documented ophthalmology exam, chief complaint, history of present illness, allergies, review of systems, past medical, past surgical, family and social history.    [name], MD 01/20/2020  15:44

## 2020-01-23 ENCOUNTER — Other Ambulatory Visit: Payer: Self-pay | Admitting: Internal Medicine

## 2020-01-23 DIAGNOSIS — Z1231 Encounter for screening mammogram for malignant neoplasm of breast: Secondary | ICD-10-CM

## 2020-01-23 LAB — SURGICAL PATHOLOGY SPECIMEN

## 2020-01-24 ENCOUNTER — Telehealth: Payer: Self-pay | Admitting: Oncology

## 2020-01-24 ENCOUNTER — Other Ambulatory Visit: Payer: Self-pay | Admitting: Internal Medicine

## 2020-01-24 NOTE — Telephone Encounter (Signed)
Patient phoned on this date and left voicemail stating that she wanted to cancel her port flush on 01-30-20 but would attend on 02-17-20. Appts changed per patient's request.

## 2020-01-30 ENCOUNTER — Inpatient Hospital Stay: Payer: Medicare Other

## 2020-02-10 ENCOUNTER — Ambulatory Visit
Admission: RE | Admit: 2020-02-10 | Discharge: 2020-02-10 | Disposition: A | Discharge status: Home / Self Care | Payer: Medicare Other | Source: Ambulatory Visit | Attending: Internal Medicine | Admitting: Internal Medicine

## 2020-02-10 ENCOUNTER — Other Ambulatory Visit: Payer: Self-pay

## 2020-02-10 DIAGNOSIS — Z1231 Encounter for screening mammogram for malignant neoplasm of breast: Secondary | ICD-10-CM | POA: Diagnosis not present

## 2020-02-14 ENCOUNTER — Other Ambulatory Visit: Payer: Self-pay | Admitting: Internal Medicine

## 2020-02-14 DIAGNOSIS — R928 Other abnormal and inconclusive findings on diagnostic imaging of breast: Secondary | ICD-10-CM

## 2020-02-14 DIAGNOSIS — N6310 Unspecified lump in the right breast, unspecified quadrant: Secondary | ICD-10-CM

## 2020-02-15 NOTE — Procedures (Signed)
Date of Service:  01/20/2020    Name:  Carrie Schmidt  Chart#:  [id number]  DOB:  May 12, 1951    MINOR ROOM OPERATIVE REPORT  Informed Consent Obtained?  Yes  Surgical Pause:  Yes Time:  22:44  Prep:  Betadine  Local Anesthesia:   31ml of 2% and Lidocaine w/ epinephrine      Procedure Note  Pre-op Diagnosis:      ICD-10-CM    1. 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 Lesion of right upper eyelid  H02.9      Post-Op Diagnosis:      ICD-10-CM    1. 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 Lesion of right upper eyelid  H02.9      Procedure:  Excision of right upper lesion  Faculty Surgeon:  [name], MD  Lesion size:  5 x 28mm and 4x79mm    Excision includes:     Lid margin      Palpebral conjunctiva  Complications:   None  Post-Op Ointment/Drop:  Erythromycin oitment    [name], MD 02/15/2020, 22:44    DESCRIPTION OF PROCEDURE:  After obtaining informed consent, the patient was brought to the operating suite and laid supine on the operating table.  2% lidocaine with 1:200,000 dilution of epinephrine were infiltrated into the area of the lesion(s).  The face was prepped and draped in a sterile ophthalmic fashion.     The lesion was grasped with 0.5 forcep and excised with [place] scissor.  Hemostasis was maintained with bipolar cautery.      Antibiotic ointment was applied over the wounds.  The patient tolerated the procedure well and was taken to the recovery room in stable condition.  The patient was instructed to use antibiotic ointment on the wounds q.i.d. for the next 7 days.  The patient will return to Dr. Lamont [facility] clinic in 1 week or sooner p.r.n.        Post-Procedure Note  Bleeding:    without any  bleeding  Patient is stable, and ready for discharge from the clinic.  Follow-up in 2 weeks, sooner PRN     [name], MD 02/15/2020, 22:44

## 2020-02-17 ENCOUNTER — Inpatient Hospital Stay: Payer: Medicare Other | Attending: Oncology

## 2020-02-17 ENCOUNTER — Ambulatory Visit
Admission: RE | Admit: 2020-02-17 | Discharge: 2020-02-17 | Disposition: A | Discharge status: Home / Self Care | Payer: Medicare Other | Source: Ambulatory Visit | Attending: Oncology | Admitting: Oncology

## 2020-02-17 ENCOUNTER — Other Ambulatory Visit: Payer: Self-pay

## 2020-02-17 ENCOUNTER — Other Ambulatory Visit: Payer: Self-pay | Admitting: *Deleted

## 2020-02-17 DIAGNOSIS — C3490 Malignant neoplasm of unspecified part of unspecified bronchus or lung: Secondary | ICD-10-CM | POA: Diagnosis not present

## 2020-02-17 DIAGNOSIS — C7902 Secondary malignant neoplasm of left kidney and renal pelvis: Secondary | ICD-10-CM | POA: Insufficient documentation

## 2020-02-17 DIAGNOSIS — J910 Malignant pleural effusion: Secondary | ICD-10-CM | POA: Diagnosis not present

## 2020-02-17 DIAGNOSIS — C3492 Malignant neoplasm of unspecified part of left bronchus or lung: Secondary | ICD-10-CM | POA: Diagnosis present

## 2020-02-17 DIAGNOSIS — Z95828 Presence of other vascular implants and grafts: Secondary | ICD-10-CM

## 2020-02-17 LAB — CBC WITH DIFFERENTIAL/PLATELET
Abs Immature Granulocytes: 0.03 10*3/uL (ref 0.00–0.07)
Basophils Absolute: 0 10*3/uL (ref 0.0–0.1)
Basophils Relative: 1 %
Eosinophils Absolute: 0.2 10*3/uL (ref 0.0–0.5)
Eosinophils Relative: 3 %
HCT: 30.7 % — ABNORMAL LOW (ref 36.0–46.0)
Hemoglobin: 10.3 g/dL — ABNORMAL LOW (ref 12.0–15.0)
Immature Granulocytes: 1 %
Lymphocytes Relative: 12 %
Lymphs Abs: 0.7 10*3/uL (ref 0.7–4.0)
MCH: 27 pg (ref 26.0–34.0)
MCHC: 33.6 g/dL (ref 30.0–36.0)
MCV: 80.4 fL (ref 80.0–100.0)
Monocytes Absolute: 0.5 10*3/uL (ref 0.1–1.0)
Monocytes Relative: 9 %
Neutro Abs: 4 10*3/uL (ref 1.7–7.7)
Neutrophils Relative %: 74 %
Platelets: 274 10*3/uL (ref 150–400)
RBC: 3.82 MIL/uL — ABNORMAL LOW (ref 3.87–5.11)
RDW: 14.7 % (ref 11.5–15.5)
WBC: 5.3 10*3/uL (ref 4.0–10.5)
nRBC: 0 % (ref 0.0–0.2)

## 2020-02-17 LAB — COMPREHENSIVE METABOLIC PANEL
ALT: 18 U/L (ref 0–44)
AST: 21 U/L (ref 15–41)
Albumin: 3.5 g/dL (ref 3.5–5.0)
Alkaline Phosphatase: 84 U/L (ref 38–126)
Anion gap: 9 (ref 5–15)
BUN: 16 mg/dL (ref 8–23)
CO2: 24 mmol/L (ref 22–32)
Calcium: 8.4 mg/dL — ABNORMAL LOW (ref 8.9–10.3)
Chloride: 101 mmol/L (ref 98–111)
Creatinine, Ser: 0.92 mg/dL (ref 0.44–1.00)
GFR calc Af Amer: >60 mL/min (ref >60)
GFR calc non Af Amer: >60 mL/min (ref >60)
Glucose, Bld: 88 mg/dL (ref 70–99)
Potassium: 4.1 mmol/L (ref 3.5–5.1)
Sodium: 134 mmol/L — ABNORMAL LOW (ref 135–145)
Total Bilirubin: 0.5 mg/dL (ref 0.3–1.2)
Total Protein: 7.5 g/dL (ref 6.5–8.1)

## 2020-02-17 LAB — PHOSPHORUS: Phosphorus: 4.6 mg/dL (ref 2.5–4.6)

## 2020-02-17 LAB — MAGNESIUM: Magnesium: 1.9 mg/dL (ref 1.7–2.4)

## 2020-02-17 MED ORDER — SODIUM CHLORIDE 0.9% FLUSH
10.0000 mL | Freq: Once | INTRAVENOUS | Status: AC
  Administered 2020-02-17: 10 mL via INTRAVENOUS
  Filled 2020-02-17: qty 10

## 2020-02-17 MED ORDER — HEPARIN SOD (PORK) LOCK FLUSH 100 UNIT/ML IV SOLN
500.0000 [IU] | Freq: Once | INTRAVENOUS | Status: AC
  Administered 2020-02-17: 500 [IU] via INTRAVENOUS
  Filled 2020-02-17: qty 5

## 2020-02-17 MED ORDER — IOHEXOL 300 MG/ML  SOLN
80.0000 mL | Freq: Once | INTRAMUSCULAR | Status: AC | PRN
  Administered 2020-02-17: 80 mL via INTRAVENOUS

## 2020-02-17 MED ORDER — HEPARIN SOD (PORK) LOCK FLUSH 100 UNIT/ML IV SOLN
INTRAVENOUS | Status: AC
  Filled 2020-02-17: qty 5

## 2020-02-20 NOTE — Progress Notes (Signed)
[place]  [phone] [phone]  ID: Lauren Page OB: 05-06-1951  MR#: [id number]  XKG#:[id number]  Patient Care Team: [name], MD as PCP - General (Internal Medicine) Lloyd Huger, MD as Consulting Physician (Oncology)  I connected with Lauren Page on 02/25/20 at  2:15 PM EDT by video enabled telemedicine visit and verified that I am speaking with the correct person using two identifiers.   I discussed the limitations, risks, security and privacy concerns of performing an evaluation and management service by telemedicine and the availability of in-person appointments. I also discussed with the patient that there may be a patient responsible charge related to this service. The patient expressed understanding and agreed to proceed.   Other persons participating in the visit and their role in the encounter: Patient, MD.  Patient's location: Clinic. Provider's location: Home.  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  INTERVAL HISTORY: Patient agreed to video assisted telemedicine visit for further evaluation and discussion of her imaging results.  Her husband recently had surgery and spent nearly a month in the hospital and patient has become his primary caretaker.  She has increased stress and anxiety from this, but otherwise feels well.  She does not complain of shortness of breath.  She continues to tolerate Tagrisso without significant side effects.  She has no neurologic complaints.  She denies any recent fevers or illnesses.  She has a good appetite and denies weight loss.  She denies any chest pain, cough, or hemoptysis.  She denies any nausea, vomiting, constipation, or diarrhea.  She has no urinary complaints.  Patient offers no further specific complaints today.  REVIEW OF SYSTEMS:   Review of Systems  Constitutional: Positive for malaise/fatigue. Negative for fever and  weight loss.  Respiratory: Negative.  Negative for cough, hemoptysis and shortness of breath.   Cardiovascular: Negative.  Negative for chest pain and leg swelling.  Gastrointestinal: Negative for abdominal pain, constipation, diarrhea and nausea.  Genitourinary: Negative.  Negative for dysuria and flank pain.  Musculoskeletal: Negative.  Negative for back pain and joint pain.  Skin: Negative.  Negative for rash.  Neurological: Negative.  Negative for sensory change, focal weakness, weakness and headaches.  Psychiatric/Behavioral: The patient is nervous/anxious.     As per HPI. Otherwise, a complete review of systems is negative.  PAST MEDICAL HISTORY: Past Medical History:  Diagnosis Date  . Abnormal echocardiogram   . Acid reflux   . Allergic rhinitis   . Anemia    HAD TO HAVE IRON INFUSIONS BACK IN 2015  . Arthritis    bil hands and back  . Asthma   . Bloody discharge from left nipple   . Cancer of right lung ([place]) 2015   lung cancer - chemo, Dr Genevive Bi removed RML Lobectomy.   . Colon polyps   . COPD (chronic obstructive pulmonary disease) ([place])   . Dyspnea    WITH EXERTION  . Family history of breast cancer   . Family history of cervical cancer   . Family history of colon cancer   . Family history of skin cancer   . Hypertension   . Malignant neoplasm of middle lobe of right lung ([place])   . Mitral valve prolapse   . Mitral valve prolapse   . Non-small cell lung cancer ([place])   . Personal history of chemotherapy    F/U Lung Cancer  . Personal history of radiation therapy  Lung cancer  . Primary cancer of right middle lobe of lung ([place]) 03/11/2016  . Squamous cell carcinoma of skin 07/07/2009   right supraclavicular/EDC  . Stage IV adenocarcinoma of lung, unspecified laterality ([place])     PAST SURGICAL HISTORY: Past Surgical History:  Procedure Laterality Date  . ABDOMINAL HYSTERECTOMY    . BAND HEMORRHOIDECTOMY    . BREAST BIOPSY Left 03/01/2019   Procedure: LEFT  BREAST EXCISIONAL BIOPSY WITH NEEDLE LOCALIZATION;  Surgeon: Herbert Pun, MD;  Location: [facility] ORS;  Service: General;  Laterality: Left;  . BREAST EXCISIONAL BIOPSY Left 1994 (-), 2004 (-)   benign  . BREAST EXCISIONAL BIOPSY Left 03/01/2019   - INTRADUCTAL PAPILLOMA WITH SCLEROSIS.   Marland Kitchen COLONOSCOPY  2008    Dr. Donnella Sham  . COLONOSCOPY WITH PROPOFOL N/A 04/12/2019   Procedure: COLONOSCOPY WITH PROPOFOL;  Surgeon: Lollie Sails, MD;  Location[facility] ENDOSCOPY;  Service: Endoscopy;  Laterality: N/A;  . IR RADIOLOGIST EVAL & MGMT  08/28/2019  . IR RADIOLOGIST EVAL & MGMT  10/30/2019  . IR RADIOLOGIST EVAL & MGMT  11/26/2019  . KNEE SURGERY  2007  . LUNG LOBECTOMY Right    middle lobe  . PORTACATH PLACEMENT Right 02/09/2017   Procedure: INSERTION PORT-A-CATH;  Surgeon: Nestor Lewandowsky, MD;  Location: [facility] ORS;  Service: General;  Laterality: Right;  . RADIOLOGY WITH ANESTHESIA N/A 10/02/2019   Procedure: MICROWAVE THERMA ABLATION;  Surgeon: Aletta Edouard, MD;  Location: [facility] ORS;  Service: Radiology;  Laterality: N/A;  . SALIVARY GLAND SURGERY Left 1983   with lymph node removal also  . SALIVARY GLAND SURGERY     salivary gland removal   . UPPER GI ENDOSCOPY  2008  . VIDEO BRONCHOSCOPY WITH ENDOBRONCHIAL ULTRASOUND Right 01/24/2017   Procedure: VIDEO BRONCHOSCOPY WITH ENDOBRONCHIAL ULTRASOUND;  Surgeon: [name], MD;  Location: [facility] ORS;  Service: Pulmonary;  Laterality: Right;    FAMILY HISTORY: Family History  Problem Relation Age of Onset  . Breast cancer Maternal Aunt 60  . Alzheimer's disease Maternal Aunt   . Breast cancer Paternal Aunt        dx. in her late 55s  . Breast cancer Maternal Grandmother        dx. in her 61s  . Cervical cancer Maternal Grandmother 60  . Colon cancer Maternal Grandmother        dx. in her 60s  . Breast cancer Maternal Aunt 60  . Alzheimer's disease Maternal Aunt   . Breast cancer Paternal Aunt        dx. in her late 8s  .  Heart disease Paternal Aunt   . Healthy Mother   . Healthy Father   . Colon cancer Father 55  . Skin cancer Father   . Heart attack Father   . Heart attack Paternal Grandfather   . Skin cancer Paternal Aunt   . Heart attack Paternal Uncle 2  . Heart attack Paternal Uncle     ADVANCED DIRECTIVES (Y/N):  N  HEALTH MAINTENANCE: Social History   Tobacco Use  . Smoking status: Never Smoker  . Smokeless tobacco: Never Used  Vaping Use  . Vaping Use: Never used  Substance Use Topics  . Alcohol use: Yes    Alcohol/week: 7.0 standard drinks    Types: 7 Glasses of wine per week  . Drug use: No     Colonoscopy:  PAP:  Bone density:  Lipid panel:  Allergies  Allergen Reactions  . Other Other (See Comments)  Cat gut sutures get infected Cat gut sutures get infected  . Penicillins Swelling and Rash    Did it involve swelling of the face/tongue/throat, SOB, or low BP? Unknown Did it involve sudden or severe rash/hives, skin peeling, or any reaction on the inside of your mouth or nose? No Did you need to seek medical attention at a hospital or doctor's office? Yes When did it last happen?30 + years  If all above answers are "NO", may proceed with cephalosporin use.    . Sulfa Antibiotics Nausea And Vomiting    Current Outpatient Medications  Medication Sig Dispense Refill  . albuterol (PROVENTIL HFA;VENTOLIN HFA) 108 (90 Base) MCG/ACT inhaler Inhale 1-2 puffs into the lungs every 6 (six) hours as needed for wheezing or shortness of breath.    Marland Kitchen ascorbic acid (VITAMIN C) 100 MG tablet Take 100 mg by mouth daily.     . B Complex Vitamins (VITAMIN B COMPLEX PO) Take 1 tablet by mouth daily.     . Biotin w/ Vitamins C & E (HAIR/SKIN/NAILS PO) Take 1 tablet by mouth daily.    . Calcium Carb-Cholecalciferol (CALCIUM 600+D) 600-800 MG-UNIT TABS Take 2 tablets by mouth daily.    . Cholecalciferol (D3 MAXIMUM STRENGTH) 5000 units capsule Take 5,000 Units by mouth daily.    .  COLLAGEN PO Take 1,000 mg by mouth daily.    . diclofenac Sodium (VOLTAREN) 1 % GEL Apply 2 g topically 4 (four) times daily as needed (pain).     . fexofenadine (ALLEGRA) 180 MG tablet Take 180 mg by mouth daily as needed for allergies.     . fluticasone (FLONASE) 50 MCG/ACT nasal spray Place 2 sprays into both nostrils daily as needed for allergies or rhinitis.    . Fluticasone-Salmeterol (ADVAIR) 250-50 MCG/DOSE AEPB Inhale 1 puff into the lungs 2 (two) times daily as needed (for respiratory issues.).    Marland Kitchen lansoprazole (PREVACID) 30 MG capsule Take 30 mg by mouth daily.     Marland Kitchen losartan (COZAAR) 25 MG tablet Take 25 mg by mouth at bedtime.     . magic mouthwash SOLN Take 5-10 mLs by mouth 4 (four) times daily as needed for mouth pain. 480 mL 0  . osimertinib mesylate (TAGRISSO) 80 MG tablet Take 1 tablet (80 mg total) by mouth daily. 30 tablet 5  . polyethylene glycol (MIRALAX / GLYCOLAX) packet Take 17 g by mouth daily as needed for mild constipation.    Marland Kitchen PREMARIN 1.25 MG tablet Take 1.25 mg by mouth daily.   1  . prochlorperazine (COMPAZINE) 10 MG tablet TAKE 1 TABLET (10 MG TOTAL) BY MOUTH EVERY 6 (SIX) HOURS AS NEEDED FOR NAUSEA OR VOMITING. 40 tablet 1  . triamcinolone (KENALOG) 0.1 % paste Use as directed 1 application in the mouth or throat 2 (two) times daily. 15 g 12  . zinc gluconate 50 MG tablet Take 50 mg by mouth daily.    . cyclobenzaprine (FLEXERIL) 5 MG tablet Take 1 tablet (5 mg total) by mouth 3 (three) times daily as needed for muscle spasms. (Patient not taking: Reported on 11/21/2019) 30 tablet 0  . lidocaine-prilocaine (EMLA) cream Apply 1 application topically as needed. Apply generously over the Mediport 45 minutes prior to chemotherapy. (Patient not taking: Reported on 09/24/2019) 30 g 0  . ondansetron (ZOFRAN) 8 MG tablet Take 1 tablet (8 mg total) by mouth every 8 (eight) hours as needed for nausea or vomiting (start 3 days; after chemo). (Patient not taking:  Reported on  09/24/2019) 40 tablet 1   No current facility-administered medications for this visit.   Facility-Administered Medications Ordered in Other Visits  Medication Dose Route Frequency Provider Last Rate Last Admin  . heparin lock flush 100 unit/mL  500 Units Intravenous Once Lloyd Huger, MD      . sodium chloride flush (NS) 0.9 % injection 10 mL  10 mL Intravenous PRN Lloyd Huger, MD      . sodium chloride flush (NS) 0.9 % injection 10 mL  10 mL Intravenous PRN Lloyd Huger, MD   10 mL at 01/22/18 1419    OBJECTIVE: Vitals:   02/24/20 1419  BP: 126/84  Pulse: 98  Resp: 19  Temp: 98.9 F (37.2 C)  SpO2: 94%     There is no height or weight on file to calculate BMI.    ECOG FS:1 - Symptomatic but completely ambulatory  General: Well-developed, well-nourished, no acute distress. HEENT: Normocephalic. Neuro: Alert, answering all questions appropriately. Cranial nerves grossly intact. Psych: Normal affect.   LAB RESULTS:  Lab Results  Component Value Date   NA 134 (L) 02/17/2020   K 4.1 02/17/2020   CL 101 02/17/2020   CO2 24 02/17/2020   GLUCOSE 88 02/17/2020   BUN 16 02/17/2020   CREATININE 0.92 02/17/2020   CALCIUM 8.4 (L) 02/17/2020   PROT 7.5 02/17/2020   ALBUMIN 3.5 02/17/2020   AST 21 02/17/2020   ALT 18 02/17/2020   ALKPHOS 84 02/17/2020   BILITOT 0.5 02/17/2020   GFRNONAA >60 02/17/2020   GFRAA >60 02/17/2020    Lab Results  Component Value Date   WBC 5.3 02/17/2020   NEUTROABS 4.0 02/17/2020   HGB 10.3 (L) 02/17/2020   HCT 30.7 (L) 02/17/2020   MCV 80.4 02/17/2020   PLT 274 02/17/2020     STUDIES: CT Chest W Contrast  Result Date: 02/17/2020 CLINICAL DATA:  Follow-up of RIGHT lung cancer diagnosed in 2015. History of LEFT renal mass ablation. EXAM: CT CHEST, ABDOMEN, AND PELVIS WITH CONTRAST TECHNIQUE: Multidetector CT imaging of the chest, abdomen and pelvis was performed following the standard protocol during bolus  administration of intravenous contrast. CONTRAST:  23m OMNIPAQUE IOHEXOL 300 MG/ML  SOLN COMPARISON:  Nov 14, 2019 FINDINGS: CT CHEST FINDINGS Cardiovascular: Mild dilation of the ascending thoracic aorta with scattered atheromatous changes in the thoracic aorta, similar to the previous exam. RIGHT-sided Port-A-Cath terminates in the distal superior vena cava with similar appearance as well. Distortion of RIGHT hilum due to post treatment changes and disease in the chest. Central pulmonary vasculature is otherwise unremarkable. Mediastinum/Nodes: No axillary lymphadenopathy. No thoracic inlet lymphadenopathy. No hilar adenopathy on the LEFT with diffuse soft tissue about the RIGHT hilum contiguous with pleural metastatic disease. No discrete mediastinal adenopathy. Lungs/Pleura: Since the prior study there has been moderate decrease in RIGHT-sided pleural fluid now mainly sub pulmonic but associated with a nodular pleural rind is circumferential and worse at the RIGHT lung base. Post treatment changes about the RIGHT hilum are contiguous with these findings. No LEFT-sided effusion. Small LEFT lower lobe pulmonary nodule is present measuring approximately 4 mm on image 104 of series 3 but there is resolution of the patchy ground-glass opacities that were seen on the previous exam. Airways are patent in the LEFT chest and narrowed at the RIGHT hilum as before. An example of pleural thickening is seen with 7 mm thickening along the lateral and posterior RIGHT chest (image 46 of series 2 at  7 mm. Musculoskeletal: See below for full musculoskeletal detail. CT ABDOMEN PELVIS FINDINGS Hepatobiliary: No focal, suspicious lesion in the liver with stable low-density focus in the RIGHT hepatic lobe less than a cm. No pericholecystic stranding or sign of biliary duct distension. The portal vein is patent. Pancreas: Pancreas is normal without ductal dilation or inflammation. Spleen: Spleen normal in size and contour.  Adrenals/Urinary Tract: Adrenal glands are normal. Symmetric renal enhancement. Evidence of prior LEFT renal mass ablation with decreasing size of the ablation zone measuring approximately 1.5 x 1.6 cm in greatest axial dimension previously approximately 2.1 cm. Despite lack of precontrast imaging there are no signs of disease recurrence within the ablated area the urinary bladder is decompressed limiting assessment. No hydronephrosis. Stomach/Bowel: No acute gastrointestinal process to the extent evaluated. Colonic diverticulosis without diverticulitis. Vascular/Lymphatic: Calcified atheromatous plaque of the abdominal aorta, minimal without aneurysmal dilation. No adenopathy in the retroperitoneum. No adenopathy in the upper abdomen. Reproductive: Post hysterectomy.  No adnexal masses. Other: No ascites. Musculoskeletal: No acute bone finding or destructive bone process. IMPRESSION: 1. Marked interval worsening of nodular and circumferential pleural involvement in the RIGHT chest. 2. Interval reduction volume of pleural fluid since the previous study likely relates to thoracentesis or other intervention. 3. Resolution of patchy opacities, likely related to pneumonitis either infectious or inflammatory. 4. Post renal ablation on the LEFT without current signs of disease recurrence. Attention on follow-up. Electronically Signed   By: Zetta Bills M.D.   On: 02/17/2020 17:12   CT Abdomen Pelvis W Contrast  Result Date: 02/17/2020 CLINICAL DATA:  Follow-up of RIGHT lung cancer diagnosed in 2015. History of LEFT renal mass ablation. EXAM: CT CHEST, ABDOMEN, AND PELVIS WITH CONTRAST TECHNIQUE: Multidetector CT imaging of the chest, abdomen and pelvis was performed following the standard protocol during bolus administration of intravenous contrast. CONTRAST:  29m OMNIPAQUE IOHEXOL 300 MG/ML  SOLN COMPARISON:  Nov 14, 2019 FINDINGS: CT CHEST FINDINGS Cardiovascular: Mild dilation of the ascending thoracic aorta  with scattered atheromatous changes in the thoracic aorta, similar to the previous exam. RIGHT-sided Port-A-Cath terminates in the distal superior vena cava with similar appearance as well. Distortion of RIGHT hilum due to post treatment changes and disease in the chest. Central pulmonary vasculature is otherwise unremarkable. Mediastinum/Nodes: No axillary lymphadenopathy. No thoracic inlet lymphadenopathy. No hilar adenopathy on the LEFT with diffuse soft tissue about the RIGHT hilum contiguous with pleural metastatic disease. No discrete mediastinal adenopathy. Lungs/Pleura: Since the prior study there has been moderate decrease in RIGHT-sided pleural fluid now mainly sub pulmonic but associated with a nodular pleural rind is circumferential and worse at the RIGHT lung base. Post treatment changes about the RIGHT hilum are contiguous with these findings. No LEFT-sided effusion. Small LEFT lower lobe pulmonary nodule is present measuring approximately 4 mm on image 104 of series 3 but there is resolution of the patchy ground-glass opacities that were seen on the previous exam. Airways are patent in the LEFT chest and narrowed at the RIGHT hilum as before. An example of pleural thickening is seen with 7 mm thickening along the lateral and posterior RIGHT chest (image 46 of series 2 at 7 mm. Musculoskeletal: See below for full musculoskeletal detail. CT ABDOMEN PELVIS FINDINGS Hepatobiliary: No focal, suspicious lesion in the liver with stable low-density focus in the RIGHT hepatic lobe less than a cm. No pericholecystic stranding or sign of biliary duct distension. The portal vein is patent. Pancreas: Pancreas is normal without ductal dilation or inflammation.  Spleen: Spleen normal in size and contour. Adrenals/Urinary Tract: Adrenal glands are normal. Symmetric renal enhancement. Evidence of prior LEFT renal mass ablation with decreasing size of the ablation zone measuring approximately 1.5 x 1.6 cm in greatest  axial dimension previously approximately 2.1 cm. Despite lack of precontrast imaging there are no signs of disease recurrence within the ablated area the urinary bladder is decompressed limiting assessment. No hydronephrosis. Stomach/Bowel: No acute gastrointestinal process to the extent evaluated. Colonic diverticulosis without diverticulitis. Vascular/Lymphatic: Calcified atheromatous plaque of the abdominal aorta, minimal without aneurysmal dilation. No adenopathy in the retroperitoneum. No adenopathy in the upper abdomen. Reproductive: Post hysterectomy.  No adnexal masses. Other: No ascites. Musculoskeletal: No acute bone finding or destructive bone process. IMPRESSION: 1. Marked interval worsening of nodular and circumferential pleural involvement in the RIGHT chest. 2. Interval reduction volume of pleural fluid since the previous study likely relates to thoracentesis or other intervention. 3. Resolution of patchy opacities, likely related to pneumonitis either infectious or inflammatory. 4. Post renal ablation on the LEFT without current signs of disease recurrence. Attention on follow-up. Electronically Signed   By: Zetta Bills M.D.   On: 02/17/2020 17:12   MM 3D SCREEN BREAST BILATERAL  Result Date: 02/10/2020 CLINICAL DATA:  Screening. EXAM: DIGITAL SCREENING BILATERAL MAMMOGRAM WITH TOMO AND CAD COMPARISON:  Previous exam(s). ACR Breast Density Category c: The breast tissue is heterogeneously dense, which may obscure small masses. FINDINGS: In the right breast, a possible mass warrants further evaluation. In the left breast, no findings suspicious for malignancy. Images were processed with CAD. IMPRESSION: Further evaluation is suggested for possible mass in the right breast. RECOMMENDATION: Diagnostic mammogram and possibly ultrasound of the right breast. (Code:[id number]) The patient will be contacted regarding the findings, and additional imaging will be scheduled. BI-RADS CATEGORY  0: Incomplete.  Need additional imaging evaluation and/or prior mammograms for comparison. Electronically Signed   By: Ammie Ferrier M.D.   On: 02/10/2020 16:56    ONCOLOGY HISTORY: Patient initially underwent resection in May 2015 and was noted to have the EGFR mutation.  She initially received 4 cycles of adjuvant cisplatin and Alimta completing on January 23, 2014.  Patient was on the [facility] protocol for maintenance Tarceva versus placebo, but discontinued treatment in November 2015 because of significant side effects.  Although blinded, presumption was she was receiving Tarceva.  She recently was noted to have a recurrence confirmed by right paratracheal lymph node biopsy on January 24, 2017.  PET scan on February 03, 2017 did not reveal any distant metastasis.  She underwent treatment with weekly carboplatinum and Taxol along with daily XRT finishing in October 2018.  She initiated maintenance durvalumab on June 29, 2017.  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  Patient started on Tagrisso in April 2019.   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  PLAN:   1. 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  Patient initiated Tagrisso in April 2019.  PET scan results from July 09, 2019 reviewed independently which may show mild progression of disease.  Patient noted to have an isolated metastasis in left kidney and has since undergone ablation.  She also underwent thoracentesis in May 2021 for malignant pleural effusion.  CT scan results from February 17, 2020 reviewed independently and reported as above with what appears to be continue progression of disease.  Prior  to changing treatments, will get PET scan to further evaluate.  Continue Tagrisso 80 mg daily at this time.  Return to clinic in 2 weeks after her PET scan for video assisted telemedicine visit  to discuss the results and treatment planning if necessary. 2.  Diarrhea: Patient does not complain of this today.  Continue Lomotil as prescribed. 3.  Cough: Chronic and unchanged, monitor. 4.  Fingernail cracking: Patient does not complain of this today.  Possibly secondary to [place]. 5.  Breast lesion: Benign papilloma.  Patient underwent lumpectomy on March 01, 2019.  Repeat mammogram on February 10, 2020 was reported as BI-RADS 0.  Patient will require additional imaging which is scheduled for March 03, 2020. 6.  Right pleural effusion: Malignant.  Patient had repeat thoracentesis in May 2021.  I provided 20 minutes of face-to-face video visit time during this encounter which included chart review, counseling, and coordination of care as documented above.   Patient expressed understanding and was in agreement with this plan. She also understands that She can call clinic at any time with any questions, concerns, or complaints.    Lloyd Huger, MD   02/25/2020 9:36 AM

## 2020-02-21 ENCOUNTER — Encounter: Payer: Self-pay | Admitting: Oncology

## 2020-02-21 NOTE — Progress Notes (Signed)
Patient was called for pre assessment. She states nothing has changed since she was last seen and denies any concerns or questions at this time.

## 2020-02-24 ENCOUNTER — Other Ambulatory Visit: Payer: Self-pay

## 2020-02-24 ENCOUNTER — Inpatient Hospital Stay (HOSPITAL_BASED_OUTPATIENT_CLINIC_OR_DEPARTMENT_OTHER): Payer: Medicare Other | Admitting: Oncology

## 2020-02-24 VITALS — BP 126/84 | HR 98 | Temp 98.9°F | Resp 19

## 2020-02-24 DIAGNOSIS — C3490 Malignant neoplasm of unspecified part of unspecified bronchus or lung: Secondary | ICD-10-CM | POA: Diagnosis not present

## 2020-02-24 NOTE — Progress Notes (Signed)
Pt states she has a mild pain that is intermittent close to bottom of R anterior rib cage.Rare dyspnea with walking up stairs, inhaler helps with that. Appetite is 50% normal due to caring for her husband at [facility].He was Inpatient at [facility] for 29 days after esphogeal surgery.Here today for CT results.

## 2020-02-27 ENCOUNTER — Telehealth: Payer: Self-pay | Admitting: *Deleted

## 2020-02-27 NOTE — Telephone Encounter (Signed)
Call placed to patient to discuss information needed for cancer policy to be completed. Cancer policy has been completed at this time, message has been sent to request needed medical records on patients behalf.

## 2020-02-29 ENCOUNTER — Encounter: Payer: Self-pay | Admitting: Oncology

## 2020-03-02 ENCOUNTER — Other Ambulatory Visit: Payer: Self-pay | Admitting: *Deleted

## 2020-03-02 MED ORDER — CLOBETASOL PROPIONATE 0.05 % EX OINT: 1.0000 "application " | TOPICAL_OINTMENT | Freq: Two times a day (BID) | CUTANEOUS | 0 refills | Status: AC

## 2020-03-03 ENCOUNTER — Ambulatory Visit
Admission: RE | Admit: 2020-03-03 | Discharge: 2020-03-03 | Disposition: A | Discharge status: Home / Self Care | Payer: Medicare Other | Source: Ambulatory Visit | Attending: Internal Medicine | Admitting: Internal Medicine

## 2020-03-03 ENCOUNTER — Other Ambulatory Visit: Payer: Self-pay

## 2020-03-03 ENCOUNTER — Ambulatory Visit: Payer: Medicare Other | Attending: Internal Medicine

## 2020-03-03 DIAGNOSIS — R928 Other abnormal and inconclusive findings on diagnostic imaging of breast: Secondary | ICD-10-CM

## 2020-03-03 DIAGNOSIS — N6310 Unspecified lump in the right breast, unspecified quadrant: Secondary | ICD-10-CM | POA: Insufficient documentation

## 2020-03-03 DIAGNOSIS — Z23 Encounter for immunization: Secondary | ICD-10-CM

## 2020-03-03 NOTE — Progress Notes (Signed)
   Covid-19 Vaccination Clinic  Name:  CARRIANNE HYUN    MRN: [id number] DOB: 07/12/1950  03/03/2020  Ms. Norment was observed post Covid-19 immunization for 15 minutes without incident. She was provided with Vaccine Information Sheet and instruction to access the V-Safe system.   Ms. Rosenfield was instructed to call 911 with any severe reactions post vaccine: Marland Kitchen Difficulty breathing  . Swelling of face and throat  . A fast heartbeat  . A bad rash all over body  . Dizziness and weakness

## 2020-03-05 ENCOUNTER — Encounter
Admission: RE | Admit: 2020-03-05 | Discharge: 2020-03-05 | Disposition: A | Discharge status: Home / Self Care | Payer: Medicare Other | Source: Ambulatory Visit | Attending: Oncology | Admitting: Oncology

## 2020-03-05 ENCOUNTER — Other Ambulatory Visit: Payer: Self-pay

## 2020-03-05 DIAGNOSIS — C3490 Malignant neoplasm of unspecified part of unspecified bronchus or lung: Secondary | ICD-10-CM | POA: Insufficient documentation

## 2020-03-07 NOTE — Progress Notes (Deleted)
[place]  [phone] [phone]  ID: Lauren Page OB: 1951-03-09  MR#: [id number]  AUQ#:[id number]  Patient Care Team: [name], MD as PCP - General (Internal Medicine) Lloyd Huger, MD as Consulting Physician (Oncology)  I connected with Lauren Page on 03/07/20 at  2:00 PM EDT by {Blank single:19197::"video enabled telemedicine visit","telephone visit"} and verified that I am speaking with the correct person using two identifiers.   I discussed the limitations, risks, security and privacy concerns of performing an evaluation and management service by telemedicine and the availability of in-person appointments. I also discussed with the patient that there may be a patient responsible charge related to this service. The patient expressed understanding and agreed to proceed.   Other persons participating in the visit and their role in the encounter: Patient, MD.  Patient's location: Home. Provider's location: Clinic.  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  INTERVAL HISTORY: Patient agreed to video assisted telemedicine visit for further evaluation and discussion of her imaging results.  Her husband recently had surgery and spent nearly a month in the hospital and patient has become his primary caretaker.  She has increased stress and anxiety from this, but otherwise feels well.  She does not complain of shortness of breath.  She continues to tolerate [place] without significant side effects.  She has no neurologic complaints.  She denies any recent fevers or illnesses.  She has a good appetite and denies weight loss.  She denies any chest pain, cough, or hemoptysis.  She denies any nausea, vomiting, constipation, or diarrhea.  She has no urinary complaints.  Patient offers no further specific complaints today.  REVIEW OF SYSTEMS:   Review of Systems  Constitutional: Positive for  malaise/fatigue. Negative for fever and weight loss.  Respiratory: Negative.  Negative for cough, hemoptysis and shortness of breath.   Cardiovascular: Negative.  Negative for chest pain and leg swelling.  Gastrointestinal: Negative for abdominal pain, constipation, diarrhea and nausea.  Genitourinary: Negative.  Negative for dysuria and flank pain.  Musculoskeletal: Negative.  Negative for back pain and joint pain.  Skin: Negative.  Negative for rash.  Neurological: Negative.  Negative for sensory change, focal weakness, weakness and headaches.  Psychiatric/Behavioral: The patient is nervous/anxious.     As per HPI. Otherwise, a complete review of systems is negative.  PAST MEDICAL HISTORY: Past Medical History:  Diagnosis Date  . Abnormal echocardiogram   . Acid reflux   . Allergic rhinitis   . Anemia    HAD TO HAVE IRON INFUSIONS BACK IN 2015  . Arthritis    bil hands and back  . Asthma   . Bloody discharge from left nipple   . Cancer of right lung ([place]) 2015   lung cancer - chemo, Dr Genevive Bi removed RML Lobectomy.   . Colon polyps   . COPD (chronic obstructive pulmonary disease) ([place])   . Dyspnea    WITH EXERTION  . Family history of breast cancer   . Family history of cervical cancer   . Family history of colon cancer   . Family history of skin cancer   . Hypertension   . Malignant neoplasm of middle lobe of right lung ([place])   . Mitral valve prolapse   . Mitral valve prolapse   . Non-small cell lung cancer ([place])   . Personal history of chemotherapy    F/U Lung Cancer  . Personal history of radiation  therapy    Lung cancer  . Primary cancer of right middle lobe of lung ([place]) 03/11/2016  . Squamous cell carcinoma of skin 07/07/2009   right supraclavicular/EDC  . Stage IV adenocarcinoma of lung, unspecified laterality ([place])     PAST SURGICAL HISTORY: Past Surgical History:  Procedure Laterality Date  . ABDOMINAL HYSTERECTOMY    . BAND HEMORRHOIDECTOMY    . BREAST  BIOPSY Left 03/01/2019   Procedure: LEFT BREAST EXCISIONAL BIOPSY WITH NEEDLE LOCALIZATION;  Surgeon: Herbert Pun, MD;  Location: [facility] ORS;  Service: General;  Laterality: Left;  . BREAST EXCISIONAL BIOPSY Left 1994 (-), 2004 (-)   benign  . BREAST EXCISIONAL BIOPSY Left 03/01/2019   - INTRADUCTAL PAPILLOMA WITH SCLEROSIS.   Marland Kitchen COLONOSCOPY  2008    Dr. Donnella Sham  . COLONOSCOPY WITH PROPOFOL N/A 04/12/2019   Procedure: COLONOSCOPY WITH PROPOFOL;  Surgeon: Lollie Sails, MD;  Location: [facility] ENDOSCOPY;  Service: Endoscopy;  Laterality: N/A;  . IR RADIOLOGIST EVAL & MGMT  08/28/2019  . IR RADIOLOGIST EVAL & MGMT  10/30/2019  . IR RADIOLOGIST EVAL & MGMT  11/26/2019  . KNEE SURGERY  2007  . LUNG LOBECTOMY Right    middle lobe  . PORTACATH PLACEMENT Right 02/09/2017   Procedure: INSERTION PORT-A-CATH;  Surgeon: Nestor Lewandowsky, MD;  Location: [facility] ORS;  Service: General;  Laterality: Right;  . RADIOLOGY WITH ANESTHESIA N/A 10/02/2019   Procedure: MICROWAVE THERMA ABLATION;  Surgeon: Aletta Edouard, MD;  Location: [facility] ORS;  Service: Radiology;  Laterality: N/A;  . SALIVARY GLAND SURGERY Left 1983   with lymph node removal also  . SALIVARY GLAND SURGERY     salivary gland removal   . UPPER GI ENDOSCOPY  2008  . VIDEO BRONCHOSCOPY WITH ENDOBRONCHIAL ULTRASOUND Right 01/24/2017   Procedure: VIDEO BRONCHOSCOPY WITH ENDOBRONCHIAL ULTRASOUND;  Surgeon: [name], MD;  Location: [facility] ORS;  Service: Pulmonary;  Laterality: Right;    FAMILY HISTORY: Family History  Problem Relation Age of Onset  . Breast cancer Maternal Aunt 60  . Alzheimer's disease Maternal Aunt   . Breast cancer Paternal Aunt        dx. in her late 57s  . Breast cancer Maternal Grandmother        dx. in her 22s  . Cervical cancer Maternal Grandmother 60  . Colon cancer Maternal Grandmother        dx. in her 63s  . Breast cancer Maternal Aunt 60  . Alzheimer's disease Maternal Aunt   . Breast cancer Paternal  Aunt        dx. in her late 42s  . Heart disease Paternal Aunt   . Healthy Mother   . Healthy Father   . Colon cancer Father 10  . Skin cancer Father   . Heart attack Father   . Heart attack Paternal Grandfather   . Skin cancer Paternal Aunt   . Heart attack Paternal Uncle 50  . Heart attack Paternal Uncle     ADVANCED DIRECTIVES (Y/N):  N  HEALTH MAINTENANCE: Social History   Tobacco Use  . Smoking status: Never Smoker  . Smokeless tobacco: Never Used  Vaping Use  . Vaping Use: Never used  Substance Use Topics  . Alcohol use: Yes    Alcohol/week: 7.0 standard drinks    Types: 7 Glasses of wine per week  . Drug use: No     Colonoscopy:  PAP:  Bone density:  Lipid panel:  Allergies  Allergen Reactions  . Other  Other (See Comments)    Cat gut sutures get infected Cat gut sutures get infected  . Penicillins Swelling and Rash    Did it involve swelling of the face/tongue/throat, SOB, or low BP? Unknown Did it involve sudden or severe rash/hives, skin peeling, or any reaction on the inside of your mouth or nose? No Did you need to seek medical attention at a hospital or doctor's office? Yes When did it last happen?30 + years  If all above answers are "NO", may proceed with cephalosporin use.    . Sulfa Antibiotics Nausea And Vomiting    Current Outpatient Medications  Medication Sig Dispense Refill  . albuterol (PROVENTIL HFA;VENTOLIN HFA) 108 (90 Base) MCG/ACT inhaler Inhale 1-2 puffs into the lungs every 6 (six) hours as needed for wheezing or shortness of breath.    Marland Kitchen ascorbic acid (VITAMIN C) 100 MG tablet Take 100 mg by mouth daily.     . B Complex Vitamins (VITAMIN B COMPLEX PO) Take 1 tablet by mouth daily.     . Biotin w/ Vitamins C & E (HAIR/SKIN/NAILS PO) Take 1 tablet by mouth daily.    . Calcium Carb-Cholecalciferol (CALCIUM 600+D) 600-800 MG-UNIT TABS Take 2 tablets by mouth daily.    . Cholecalciferol (D3 MAXIMUM STRENGTH) 5000 units capsule  Take 5,000 Units by mouth daily.    . clobetasol ointment (TEMOVATE) 2.50 % Apply 1 application topically 2 (two) times daily. Dry area and apply to mouth sores twice daily as needed for up to 2 weeks. 15 g 0  . COLLAGEN PO Take 1,000 mg by mouth daily.    . cyclobenzaprine (FLEXERIL) 5 MG tablet Take 1 tablet (5 mg total) by mouth 3 (three) times daily as needed for muscle spasms. (Patient not taking: Reported on 11/21/2019) 30 tablet 0  . diclofenac Sodium (VOLTAREN) 1 % GEL Apply 2 g topically 4 (four) times daily as needed (pain).     . fexofenadine (ALLEGRA) 180 MG tablet Take 180 mg by mouth daily as needed for allergies.     . fluticasone (FLONASE) 50 MCG/ACT nasal spray Place 2 sprays into both nostrils daily as needed for allergies or rhinitis.    . Fluticasone-Salmeterol (ADVAIR) 250-50 MCG/DOSE AEPB Inhale 1 puff into the lungs 2 (two) times daily as needed (for respiratory issues.).    Marland Kitchen lansoprazole (PREVACID) 30 MG capsule Take 30 mg by mouth daily.     Marland Kitchen lidocaine-prilocaine (EMLA) cream Apply 1 application topically as needed. Apply generously over the Mediport 45 minutes prior to chemotherapy. (Patient not taking: Reported on 09/24/2019) 30 g 0  . losartan (COZAAR) 25 MG tablet Take 25 mg by mouth at bedtime.     . magic mouthwash SOLN Take 5-10 mLs by mouth 4 (four) times daily as needed for mouth pain. 480 mL 0  . ondansetron (ZOFRAN) 8 MG tablet Take 1 tablet (8 mg total) by mouth every 8 (eight) hours as needed for nausea or vomiting (start 3 days; after chemo). (Patient not taking: Reported on 09/24/2019) 40 tablet 1  . osimertinib mesylate (TAGRISSO) 80 MG tablet Take 1 tablet (80 mg total) by mouth daily. 30 tablet 5  . polyethylene glycol (MIRALAX / GLYCOLAX) packet Take 17 g by mouth daily as needed for mild constipation.    Marland Kitchen PREMARIN 1.25 MG tablet Take 1.25 mg by mouth daily.   1  . prochlorperazine (COMPAZINE) 10 MG tablet TAKE 1 TABLET (10 MG TOTAL) BY MOUTH EVERY 6 (SIX)  HOURS AS  NEEDED FOR NAUSEA OR VOMITING. 40 tablet 1  . triamcinolone (KENALOG) 0.1 % paste Use as directed 1 application in the mouth or throat 2 (two) times daily. 15 g 12  . zinc gluconate 50 MG tablet Take 50 mg by mouth daily.     No current facility-administered medications for this visit.   Facility-Administered Medications Ordered in Other Visits  Medication Dose Route Frequency Provider Last Rate Last Admin  . heparin lock flush 100 unit/mL  500 Units Intravenous Once Lloyd Huger, MD      . sodium chloride flush (NS) 0.9 % injection 10 mL  10 mL Intravenous PRN Lloyd Huger, MD      . sodium chloride flush (NS) 0.9 % injection 10 mL  10 mL Intravenous PRN Lloyd Huger, MD   10 mL at 01/22/18 1419    OBJECTIVE: There were no vitals filed for this visit.   There is no height or weight on file to calculate BMI.    ECOG FS:1 - Symptomatic but completely ambulatory  General: Well-developed, well-nourished, no acute distress. HEENT: Normocephalic. Neuro: Alert, answering all questions appropriately. Cranial nerves grossly intact. Psych: Normal affect.   LAB RESULTS:  Lab Results  Component Value Date   NA 134 (L) 02/17/2020   K 4.1 02/17/2020   CL 101 02/17/2020   CO2 24 02/17/2020   GLUCOSE 88 02/17/2020   BUN 16 02/17/2020   CREATININE 0.92 02/17/2020   CALCIUM 8.4 (L) 02/17/2020   PROT 7.5 02/17/2020   ALBUMIN 3.5 02/17/2020   AST 21 02/17/2020   ALT 18 02/17/2020   ALKPHOS 84 02/17/2020   BILITOT 0.5 02/17/2020   GFRNONAA >60 02/17/2020   GFRAA >60 02/17/2020    Lab Results  Component Value Date   WBC 5.3 02/17/2020   NEUTROABS 4.0 02/17/2020   HGB 10.3 (L) 02/17/2020   HCT 30.7 (L) 02/17/2020   MCV 80.4 02/17/2020   PLT 274 02/17/2020     STUDIES: CT Chest W Contrast  Result Date: 02/17/2020 CLINICAL DATA:  Follow-up of RIGHT lung cancer diagnosed in 2015. History of LEFT renal mass ablation. EXAM: CT CHEST, ABDOMEN, AND PELVIS  WITH CONTRAST TECHNIQUE: Multidetector CT imaging of the chest, abdomen and pelvis was performed following the standard protocol during bolus administration of intravenous contrast. CONTRAST:  53m OMNIPAQUE IOHEXOL 300 MG/ML  SOLN COMPARISON:  Nov 14, 2019 FINDINGS: CT CHEST FINDINGS Cardiovascular: Mild dilation of the ascending thoracic aorta with scattered atheromatous changes in the thoracic aorta, similar to the previous exam. RIGHT-sided Port-A-Cath terminates in the distal superior vena cava with similar appearance as well. Distortion of RIGHT hilum due to post treatment changes and disease in the chest. Central pulmonary vasculature is otherwise unremarkable. Mediastinum/Nodes: No axillary lymphadenopathy. No thoracic inlet lymphadenopathy. No hilar adenopathy on the LEFT with diffuse soft tissue about the RIGHT hilum contiguous with pleural metastatic disease. No discrete mediastinal adenopathy. Lungs/Pleura: Since the prior study there has been moderate decrease in RIGHT-sided pleural fluid now mainly sub pulmonic but associated with a nodular pleural rind is circumferential and worse at the RIGHT lung base. Post treatment changes about the RIGHT hilum are contiguous with these findings. No LEFT-sided effusion. Small LEFT lower lobe pulmonary nodule is present measuring approximately 4 mm on image 104 of series 3 but there is resolution of the patchy ground-glass opacities that were seen on the previous exam. Airways are patent in the LEFT chest and narrowed at the RIGHT hilum as before. An  example of pleural thickening is seen with 7 mm thickening along the lateral and posterior RIGHT chest (image 46 of series 2 at 7 mm. Musculoskeletal: See below for full musculoskeletal detail. CT ABDOMEN PELVIS FINDINGS Hepatobiliary: No focal, suspicious lesion in the liver with stable low-density focus in the RIGHT hepatic lobe less than a cm. No pericholecystic stranding or sign of biliary duct distension. The  portal vein is patent. Pancreas: Pancreas is normal without ductal dilation or inflammation. Spleen: Spleen normal in size and contour. Adrenals/Urinary Tract: Adrenal glands are normal. Symmetric renal enhancement. Evidence of prior LEFT renal mass ablation with decreasing size of the ablation zone measuring approximately 1.5 x 1.6 cm in greatest axial dimension previously approximately 2.1 cm. Despite lack of precontrast imaging there are no signs of disease recurrence within the ablated area the urinary bladder is decompressed limiting assessment. No hydronephrosis. Stomach/Bowel: No acute gastrointestinal process to the extent evaluated. Colonic diverticulosis without diverticulitis. Vascular/Lymphatic: Calcified atheromatous plaque of the abdominal aorta, minimal without aneurysmal dilation. No adenopathy in the retroperitoneum. No adenopathy in the upper abdomen. Reproductive: Post hysterectomy.  No adnexal masses. Other: No ascites. Musculoskeletal: No acute bone finding or destructive bone process. IMPRESSION: 1. Marked interval worsening of nodular and circumferential pleural involvement in the RIGHT chest. 2. Interval reduction volume of pleural fluid since the previous study likely relates to thoracentesis or other intervention. 3. Resolution of patchy opacities, likely related to pneumonitis either infectious or inflammatory. 4. Post renal ablation on the LEFT without current signs of disease recurrence. Attention on follow-up. Electronically Signed   By: Zetta Bills M.D.   On: 02/17/2020 17:12   CT Abdomen Pelvis W Contrast  Result Date: 02/17/2020 CLINICAL DATA:  Follow-up of RIGHT lung cancer diagnosed in 2015. History of LEFT renal mass ablation. EXAM: CT CHEST, ABDOMEN, AND PELVIS WITH CONTRAST TECHNIQUE: Multidetector CT imaging of the chest, abdomen and pelvis was performed following the standard protocol during bolus administration of intravenous contrast. CONTRAST:  28m OMNIPAQUE IOHEXOL  300 MG/ML  SOLN COMPARISON:  Nov 14, 2019 FINDINGS: CT CHEST FINDINGS Cardiovascular: Mild dilation of the ascending thoracic aorta with scattered atheromatous changes in the thoracic aorta, similar to the previous exam. RIGHT-sided Port-A-Cath terminates in the distal superior vena cava with similar appearance as well. Distortion of RIGHT hilum due to post treatment changes and disease in the chest. Central pulmonary vasculature is otherwise unremarkable. Mediastinum/Nodes: No axillary lymphadenopathy. No thoracic inlet lymphadenopathy. No hilar adenopathy on the LEFT with diffuse soft tissue about the RIGHT hilum contiguous with pleural metastatic disease. No discrete mediastinal adenopathy. Lungs/Pleura: Since the prior study there has been moderate decrease in RIGHT-sided pleural fluid now mainly sub pulmonic but associated with a nodular pleural rind is circumferential and worse at the RIGHT lung base. Post treatment changes about the RIGHT hilum are contiguous with these findings. No LEFT-sided effusion. Small LEFT lower lobe pulmonary nodule is present measuring approximately 4 mm on image 104 of series 3 but there is resolution of the patchy ground-glass opacities that were seen on the previous exam. Airways are patent in the LEFT chest and narrowed at the RIGHT hilum as before. An example of pleural thickening is seen with 7 mm thickening along the lateral and posterior RIGHT chest (image 46 of series 2 at 7 mm. Musculoskeletal: See below for full musculoskeletal detail. CT ABDOMEN PELVIS FINDINGS Hepatobiliary: No focal, suspicious lesion in the liver with stable low-density focus in the RIGHT hepatic lobe less than a cm.  No pericholecystic stranding or sign of biliary duct distension. The portal vein is patent. Pancreas: Pancreas is normal without ductal dilation or inflammation. Spleen: Spleen normal in size and contour. Adrenals/Urinary Tract: Adrenal glands are normal. Symmetric renal enhancement.  Evidence of prior LEFT renal mass ablation with decreasing size of the ablation zone measuring approximately 1.5 x 1.6 cm in greatest axial dimension previously approximately 2.1 cm. Despite lack of precontrast imaging there are no signs of disease recurrence within the ablated area the urinary bladder is decompressed limiting assessment. No hydronephrosis. Stomach/Bowel: No acute gastrointestinal process to the extent evaluated. Colonic diverticulosis without diverticulitis. Vascular/Lymphatic: Calcified atheromatous plaque of the abdominal aorta, minimal without aneurysmal dilation. No adenopathy in the retroperitoneum. No adenopathy in the upper abdomen. Reproductive: Post hysterectomy.  No adnexal masses. Other: No ascites. Musculoskeletal: No acute bone finding or destructive bone process. IMPRESSION: 1. Marked interval worsening of nodular and circumferential pleural involvement in the RIGHT chest. 2. Interval reduction volume of pleural fluid since the previous study likely relates to thoracentesis or other intervention. 3. Resolution of patchy opacities, likely related to pneumonitis either infectious or inflammatory. 4. Post renal ablation on the LEFT without current signs of disease recurrence. Attention on follow-up. Electronically Signed   By: Zetta Bills M.D.   On: 02/17/2020 17:12   US BREAST LTD UNI RIGHT INC AXILLA  Result Date: 03/03/2020 CLINICAL DATA:  Recall from screening mammography with tomosynthesis, possible asymmetry in the LOWER subareolar RIGHT breast visible only on the MLO images. Current history of lung cancer metastatic to the kidney. EXAM: DIGITAL DIAGNOSTIC RIGHT MAMMOGRAM WITH CAD AND TOMO ULTRASOUND RIGHT BREAST COMPARISON:  Previous exam(s). ACR Breast Density Category c: The breast tissue is heterogeneously dense, which may obscure small masses. FINDINGS: Tomosynthesis and synthesized spot-compression MLO view of the area of concern in the RIGHT breast and a tomosynthesis  and synthesized full field mediolateral view of the RIGHT breast were obtained. The asymmetry in the lower subareolar location questioned on screening mammography disperses with compression, indicating overlapping normal dense fibroglandular tissue. There is no visible underlying mass or architectural distortion. Dense fibroglandular tissue is present in this location. Note is made of a circumscribed isodense mass measuring approximately 8-9 mm which localizes to the [place] on the screening CC images and which has been stable over multiple prior mammograms, indicating benignity. The full field mediolateral image was processed with CAD. Targeted RIGHT breast ultrasound is performed, showing normal dense fibroglandular tissue in the lower subareolar location with imaging from 4 o'clock through 6 o'clock to 8 o'clock. The circumscribed oval parallel nearly anechoic mass with scattered internal echoes at the 8 o'clock position approximately 4 cm from the nipple measuring approximately 8 x 4 x 7 mm corresponds to the mammographically stable mass. No suspicious solid mass or abnormal acoustic shadowing is identified. IMPRESSION: No mammographic or sonographic evidence of malignancy involving the RIGHT breast. RECOMMENDATION: Screening mammogram in one year.(Code:SM-B-01Y) I have discussed the findings and recommendations with the patient. If applicable, a reminder letter will be sent to the patient regarding the next appointment. BI-RADS CATEGORY  2: Benign. Electronically Signed   By: Evangeline Dakin M.D.   On: 03/03/2020 11:56   MM DIAG BREAST TOMO UNI RIGHT  Result Date: 03/03/2020 CLINICAL DATA:  Recall from screening mammography with tomosynthesis, possible asymmetry in the LOWER subareolar RIGHT breast visible only on the MLO images. Current history of lung cancer metastatic to the kidney. EXAM: DIGITAL DIAGNOSTIC RIGHT MAMMOGRAM WITH CAD  AND TOMO ULTRASOUND RIGHT BREAST COMPARISON:  Previous  exam(s). ACR Breast Density Category c: The breast tissue is heterogeneously dense, which may obscure small masses. FINDINGS: Tomosynthesis and synthesized spot-compression MLO view of the area of concern in the RIGHT breast and a tomosynthesis and synthesized full field mediolateral view of the RIGHT breast were obtained. The asymmetry in the lower subareolar location questioned on screening mammography disperses with compression, indicating overlapping normal dense fibroglandular tissue. There is no visible underlying mass or architectural distortion. Dense fibroglandular tissue is present in this location. Note is made of a circumscribed isodense mass measuring approximately 8-9 mm which localizes to the [place] on the screening CC images and which has been stable over multiple prior mammograms, indicating benignity. The full field mediolateral image was processed with CAD. Targeted RIGHT breast ultrasound is performed, showing normal dense fibroglandular tissue in the lower subareolar location with imaging from 4 o'clock through 6 o'clock to 8 o'clock. The circumscribed oval parallel nearly anechoic mass with scattered internal echoes at the 8 o'clock position approximately 4 cm from the nipple measuring approximately 8 x 4 x 7 mm corresponds to the mammographically stable mass. No suspicious solid mass or abnormal acoustic shadowing is identified. IMPRESSION: No mammographic or sonographic evidence of malignancy involving the RIGHT breast. RECOMMENDATION: Screening mammogram in one year.(Code:[id number]-B-01Y) I have discussed the findings and recommendations with the patient. If applicable, a reminder letter will be sent to the patient regarding the next appointment. BI-RADS CATEGORY  2: Benign. Electronically Signed   By: Evangeline Dakin M.D.   On: 03/03/2020 11:56   MM 3D SCREEN BREAST BILATERAL  Result Date: 02/10/2020 CLINICAL DATA:  Screening. EXAM: DIGITAL SCREENING BILATERAL MAMMOGRAM WITH TOMO  AND CAD COMPARISON:  Previous exam(s). ACR Breast Density Category c: The breast tissue is heterogeneously dense, which may obscure small masses. FINDINGS: In the right breast, a possible mass warrants further evaluation. In the left breast, no findings suspicious for malignancy. Images were processed with CAD. IMPRESSION: Further evaluation is suggested for possible mass in the right breast. RECOMMENDATION: Diagnostic mammogram and possibly ultrasound of the right breast. (Code:[id number]) The patient will be contacted regarding the findings, and additional imaging will be scheduled. BI-RADS CATEGORY  0: Incomplete. Need additional imaging evaluation and/or prior mammograms for comparison. Electronically Signed   By: Ammie Ferrier M.D.   On: 02/10/2020 16:56    ONCOLOGY HISTORY: Patient initially underwent resection in May 2015 and was noted to have the EGFR mutation.  She initially received 4 cycles of adjuvant cisplatin and Alimta completing on January 23, 2014.  Patient was on the [facility] protocol for maintenance Tarceva versus placebo, but discontinued treatment in November 2015 because of significant side effects.  Although blinded, presumption was she was receiving Tarceva.  She recently was noted to have a recurrence confirmed by right paratracheal lymph node biopsy on January 24, 2017.  PET scan on February 03, 2017 did not reveal any distant metastasis.  She underwent treatment with weekly carboplatinum and Taxol along with daily XRT finishing in October 2018.  She initiated maintenance durvalumab on June 29, 2017.  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  Patient started on Tagrisso in April 2019.   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  PLAN:   1. 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  Patient initiated Tagrisso in  April  2019.  PET scan results from July 09, 2019 reviewed independently which may show mild progression of disease.  Patient noted to have an isolated metastasis in left kidney and has since undergone ablation.  She also underwent thoracentesis in May 2021 for malignant pleural effusion.  CT scan results from February 17, 2020 reviewed independently and reported as above with what appears to be continue progression of disease.  Prior to changing treatments, will get PET scan to further evaluate.  Continue Tagrisso 80 mg daily at this time.  Return to clinic in 2 weeks after her PET scan for video assisted telemedicine visit to discuss the results and treatment planning if necessary. 2.  Diarrhea: Patient does not complain of this today.  Continue Lomotil as prescribed. 3.  Cough: Chronic and unchanged, monitor. 4.  Fingernail cracking: Patient does not complain of this today.  Possibly secondary to [place]. 5.  Breast lesion: Benign papilloma.  Patient underwent lumpectomy on March 01, 2019.  Repeat mammogram on February 10, 2020 was reported as BI-RADS 0.  Patient will require additional imaging which is scheduled for March 03, 2020. 6.  Right pleural effusion: Malignant.  Patient had repeat thoracentesis in May 2021.   I provided *** minutes of {Blank single:19197::"face-to-face video visit time","non face-to-face telephone visit time"} during this encounter which included chart review, counseling, and coordination of care as documented above.   Patient expressed understanding and was in agreement with this plan. She also understands that She can call clinic at any time with any questions, concerns, or complaints.    Lloyd Huger, MD   03/07/2020 10:45 AM

## 2020-03-10 ENCOUNTER — Inpatient Hospital Stay: Payer: Medicare Other | Admitting: Oncology

## 2020-03-10 ENCOUNTER — Encounter: Payer: Self-pay | Admitting: *Deleted

## 2020-03-11 ENCOUNTER — Encounter
Admission: RE | Admit: 2020-03-11 | Discharge: 2020-03-11 | Disposition: A | Discharge status: Home / Self Care | Payer: Medicare Other | Source: Ambulatory Visit | Attending: Oncology | Admitting: Oncology

## 2020-03-11 ENCOUNTER — Telehealth: Payer: Self-pay | Admitting: Oncology

## 2020-03-11 ENCOUNTER — Other Ambulatory Visit: Payer: Self-pay

## 2020-03-11 DIAGNOSIS — R599 Enlarged lymph nodes, unspecified: Secondary | ICD-10-CM | POA: Insufficient documentation

## 2020-03-11 DIAGNOSIS — I700 Atherosclerosis of aorta: Secondary | ICD-10-CM | POA: Diagnosis not present

## 2020-03-11 DIAGNOSIS — K5730 Diverticulosis of large intestine without perforation or abscess without bleeding: Secondary | ICD-10-CM | POA: Insufficient documentation

## 2020-03-11 DIAGNOSIS — I517 Cardiomegaly: Secondary | ICD-10-CM | POA: Insufficient documentation

## 2020-03-11 DIAGNOSIS — C3490 Malignant neoplasm of unspecified part of unspecified bronchus or lung: Secondary | ICD-10-CM | POA: Insufficient documentation

## 2020-03-11 LAB — GLUCOSE, CAPILLARY: Glucose-Capillary: 91 mg/dL (ref 70–99)

## 2020-03-11 MED ORDER — FLUDEOXYGLUCOSE F - 18 (FDG) INJECTION
6.9000 | Freq: Once | INTRAVENOUS | Status: AC | PRN
  Administered 2020-03-11: 7 via INTRAVENOUS

## 2020-03-11 NOTE — Telephone Encounter (Signed)
Patient phoned on this date and stated that she wanted to move her appt to the morning due to a scheduling conflict in the afternoon. Appt on 03-13-20 moved to the morning per patient's request.

## 2020-03-13 ENCOUNTER — Inpatient Hospital Stay: Payer: Medicare Other | Attending: Oncology | Admitting: Oncology

## 2020-03-13 ENCOUNTER — Encounter: Payer: Self-pay | Admitting: Pharmacist

## 2020-03-13 ENCOUNTER — Encounter: Payer: Self-pay | Admitting: Oncology

## 2020-03-13 ENCOUNTER — Inpatient Hospital Stay: Payer: Medicare Other | Admitting: Oncology

## 2020-03-13 ENCOUNTER — Other Ambulatory Visit: Payer: Self-pay

## 2020-03-13 DIAGNOSIS — Z5111 Encounter for antineoplastic chemotherapy: Secondary | ICD-10-CM | POA: Insufficient documentation

## 2020-03-13 DIAGNOSIS — D649 Anemia, unspecified: Secondary | ICD-10-CM | POA: Insufficient documentation

## 2020-03-13 DIAGNOSIS — C3490 Malignant neoplasm of unspecified part of unspecified bronchus or lung: Secondary | ICD-10-CM | POA: Insufficient documentation

## 2020-03-13 DIAGNOSIS — C7902 Secondary malignant neoplasm of left kidney and renal pelvis: Secondary | ICD-10-CM | POA: Insufficient documentation

## 2020-03-13 DIAGNOSIS — R197 Diarrhea, unspecified: Secondary | ICD-10-CM | POA: Insufficient documentation

## 2020-03-13 DIAGNOSIS — J910 Malignant pleural effusion: Secondary | ICD-10-CM | POA: Insufficient documentation

## 2020-03-14 ENCOUNTER — Other Ambulatory Visit: Payer: Self-pay | Admitting: Oncology

## 2020-03-14 MED ORDER — PROCHLORPERAZINE MALEATE 10 MG PO TABS: 10.0000 mg | ORAL_TABLET | Freq: Four times a day (QID) | ORAL | 1 refills | Status: DC | PRN

## 2020-03-14 MED ORDER — ONDANSETRON HCL 8 MG PO TABS: 8.0000 mg | ORAL_TABLET | Freq: Two times a day (BID) | ORAL | 1 refills | Status: DC | PRN

## 2020-03-14 MED ORDER — FOLIC ACID 1 MG PO TABS: 1.0000 mg | ORAL_TABLET | Freq: Every day | ORAL | 3 refills | Status: DC

## 2020-03-14 MED ORDER — LIDOCAINE-PRILOCAINE 2.5-2.5 % EX CREA: TOPICAL_CREAM | CUTANEOUS | 3 refills | Status: DC

## 2020-03-14 NOTE — Progress Notes (Signed)
DISCONTINUE OFF PATHWAY REGIMEN - Non-Small Cell Lung   [id number]:Carboplatin AUC 2 + Paclitaxel 50 mg/m2 weekly + RT:   Administer weekly during RT:     Paclitaxel      Carboplatin   **Always confirm dose/schedule in your pharmacy ordering system**  REASON: Disease Progression PRIOR TREATMENT: Off Pathway: Carboplatin AUC 2 + Paclitaxel 50 mg/m2 weekly + RT TREATMENT RESPONSE: Stable Disease (SD)  START ON PATHWAY REGIMEN - Non-Small Cell Lung     A cycle is every 21 days:     Pemetrexed   **Always confirm dose/schedule in your pharmacy ordering system**  Patient Characteristics: Stage IV Metastatic, Nonsquamous, Third Line - Chemotherapy/Immunotherapy, PS = 0, 1, Prior PD-1/PD-L1 Inhibitor or No Prior PD-1/PD-L1 Inhibitor and Not a Candidate for Immunotherapy Therapeutic Status: Stage IV Metastatic Histology: Nonsquamous Cell ROS1 Rearrangement Status: Negative Other Mutations/Biomarkers: No Other Actionable Mutations Chemotherapy/Immunotherapy LOT: Third Line Chemotherapy/Immunotherapy Molecular Targeted Therapy: Not Appropriate KRAS G12C Mutation Status: Negative MET Exon 14 Mutation Status: Negative RET Gene Fusion Status: Negative EGFR Mutation Status: Positive - Common (Exon 19 Deletion or Exon 21 [id number] Substitution) NTRK Gene Fusion Status: Negative PD-L1 Expression Status: PD-L1 Negative ALK Rearrangement Status: Negative BRAF [id number] Mutation Status: Negative ECOG Performance Status: 0 Immunotherapy Candidate Status: Not a Candidate for Immunotherapy Prior Immunotherapy Status: Prior PD-1/PD-L1 Inhibitor Intent of Therapy: Non-Curative / Palliative Intent, Discussed with Patient

## 2020-03-14 NOTE — Progress Notes (Signed)
Lauren Page  [phone] [phone]  ID: Lauren Page OB: [birth year]  MR#: [id number]  QQP#:[id number]  Patient Care Team: [name], MD as PCP - General (Internal Medicine) Lloyd Huger, MD as Consulting Physician (Oncology)  I connected with Lauren Page on 03/14/20 at 10:45 AM EDT by video enabled telemedicine visit and verified that I am speaking with the correct person using two identifiers.   I discussed the limitations, risks, security and privacy concerns of performing an evaluation and management service by telemedicine and the availability of in-person appointments. I also discussed with the patient that there may be a patient responsible charge related to this service. The patient expressed understanding and agreed to proceed.   Other persons participating in the visit and their role in the encounter: Patient, MD.  Patients location: Home. Providers location: Clinic.  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  INTERVAL HISTORY: Patient agreed to video assisted telemedicine visit for further evaluation and discussion of her PET scan results.  Her husband's health is improving and she admits to less stress and anxiety. She does not complain of shortness of breath.  She has no neurologic complaints.  She denies any recent fevers or illnesses.  She has a good appetite and denies weight loss.  She denies any chest pain, cough, or hemoptysis.  She denies any nausea, vomiting, constipation, or diarrhea.  She has no urinary complaints.  Patient offers no specific complaints today.   REVIEW OF SYSTEMS:   Review of Systems  Constitutional: Negative.  Negative for fever, malaise/fatigue and weight loss.  Respiratory: Negative.  Negative for cough, hemoptysis and shortness of breath.   Cardiovascular: Negative.  Negative for chest pain and leg swelling.  Gastrointestinal: Negative  for abdominal pain, constipation, diarrhea and nausea.  Genitourinary: Negative.  Negative for dysuria and flank pain.  Musculoskeletal: Negative.  Negative for back pain and joint pain.  Skin: Negative.  Negative for rash.  Neurological: Negative.  Negative for sensory change, focal weakness, weakness and headaches.  Psychiatric/Behavioral: Negative.  The patient is not nervous/anxious.     As per HPI. Otherwise, a complete review of systems is negative.  PAST MEDICAL HISTORY: Past Medical History:  Diagnosis Date   Abnormal echocardiogram    Acid reflux    Allergic rhinitis    Anemia    HAD TO HAVE IRON INFUSIONS BACK IN 2015   Arthritis    bil hands and back   Asthma    Bloody discharge from left nipple    Cancer of right lung ([place]) 2015   lung cancer - chemo, Dr Genevive Bi removed RML Lobectomy.    Colon polyps    COPD (chronic obstructive pulmonary disease) (HCC)    Dyspnea    WITH EXERTION   Family history of breast cancer    Family history of cervical cancer    Family history of colon cancer    Family history of skin cancer    Hypertension    Malignant neoplasm of middle lobe of right lung ([place])    Mitral valve prolapse    Mitral valve prolapse    Non-small cell lung cancer ([place])    Personal history of chemotherapy    F/U Lung Cancer   Personal history of radiation therapy    Lung cancer   Primary cancer of right middle lobe of lung ([place]) 03/11/2016   Squamous cell carcinoma of skin 07/07/2009   right supraclavicular/EDC  Stage IV adenocarcinoma of lung, unspecified laterality ([place])     PAST SURGICAL HISTORY: Past Surgical History:  Procedure Laterality Date   ABDOMINAL HYSTERECTOMY     BAND HEMORRHOIDECTOMY     BREAST BIOPSY Left 03/01/2019   Procedure: LEFT BREAST EXCISIONAL BIOPSY WITH NEEDLE LOCALIZATION;  Surgeon: Herbert Pun, MD;  Location: [facility] ORS;  Service: General;  Laterality: Left;   BREAST EXCISIONAL BIOPSY Left  1994 (-), 2004 (-)   benign   BREAST EXCISIONAL BIOPSY Left 03/01/2019   - INTRADUCTAL PAPILLOMA WITH SCLEROSIS.    COLONOSCOPY  2008    Dr. Donnella Sham   COLONOSCOPY WITH PROPOFOL N/A 04/12/2019   Procedure: COLONOSCOPY WITH PROPOFOL;  Surgeon: Lollie Sails, MD;  Location: [facility] ENDOSCOPY;  Service: Endoscopy;  Laterality: N/A;   IR RADIOLOGIST EVAL & MGMT  08/28/2019   IR RADIOLOGIST EVAL & MGMT  10/30/2019   IR RADIOLOGIST EVAL & MGMT  11/26/2019   KNEE SURGERY  2007   LUNG LOBECTOMY Right    middle lobe   PORTACATH PLACEMENT Right 02/09/2017   Procedure: INSERTION PORT-A-CATH;  Surgeon: Nestor Lewandowsky, MD;  Location: [facility] ORS;  Service: General;  Laterality: Right;   RADIOLOGY WITH ANESTHESIA N/A 10/02/2019   Procedure: MICROWAVE THERMA ABLATION;  Surgeon: Aletta Edouard, MD;  Location: [facility] ORS;  Service: Radiology;  Laterality: N/A;   SALIVARY GLAND SURGERY Left 1983   with lymph node removal also   SALIVARY GLAND SURGERY     salivary gland removal    UPPER GI ENDOSCOPY  2008   VIDEO BRONCHOSCOPY WITH ENDOBRONCHIAL ULTRASOUND Right 01/24/2017   Procedure: VIDEO BRONCHOSCOPY WITH ENDOBRONCHIAL ULTRASOUND;  Surgeon: [name], MD;  Location: [facility] ORS;  Service: Pulmonary;  Laterality: Right;    FAMILY HISTORY: Family History  Problem Relation Age of Onset   Breast cancer Maternal Aunt 10   Alzheimer's disease Maternal Aunt    Breast cancer Paternal Aunt        dx. in her late 28s   Breast cancer Maternal Grandmother        dx. in her 68s   Cervical cancer Maternal Grandmother 60   Colon cancer Maternal Grandmother        dx. in her 63s   Breast cancer Maternal Aunt 60   Alzheimer's disease Maternal Aunt    Breast cancer Paternal Aunt        dx. in her late 58s   Heart disease Paternal 3    Healthy Mother    Healthy Father    Colon cancer Father 58   Skin cancer Father    Heart attack Father    Heart attack Paternal Grandfather     Skin cancer Paternal Aunt    Heart attack Paternal Uncle 48   Heart attack Paternal Uncle     ADVANCED DIRECTIVES (Y/N):  N  HEALTH MAINTENANCE: Social History   Tobacco Use   Smoking status: Never Smoker   Smokeless tobacco: Never Used  Vaping Use   Vaping Use: Never used  Substance Use Topics   Alcohol use: Yes    Alcohol/week: 7.0 standard drinks    Types: 7 Glasses of wine per week   Drug use: No     Colonoscopy:  PAP:  Bone density:  Lipid panel:  Allergies  Allergen Reactions   Other Other (See Comments)    Cat gut sutures get infected Cat gut sutures get infected   Penicillins Swelling and Rash    Did it involve swelling of the face/tongue/throat,  SOB, or low BP? Unknown Did it involve sudden or severe rash/hives, skin peeling, or any reaction on the inside of your mouth or nose? No Did you need to seek medical attention at a hospital or doctor's office? Yes When did it last happen?30 + years  If all above answers are NO, may proceed with cephalosporin use.     Sulfa Antibiotics Nausea And Vomiting    Current Outpatient Medications  Medication Sig Dispense Refill   albuterol (PROVENTIL HFA;VENTOLIN HFA) 108 (90 Base) MCG/ACT inhaler Inhale 1-2 puffs into the lungs every 6 (six) hours as needed for wheezing or shortness of breath.     ascorbic acid (VITAMIN C) 100 MG tablet Take 100 mg by mouth daily.      B Complex Vitamins (VITAMIN B COMPLEX PO) Take 1 tablet by mouth daily.      Biotin w/ Vitamins C & E (HAIR/SKIN/NAILS PO) Take 1 tablet by mouth daily.     Calcium Carb-Cholecalciferol (CALCIUM 600+D) 600-800 MG-UNIT TABS Take 2 tablets by mouth daily.     Cholecalciferol (D3 MAXIMUM STRENGTH) 5000 units capsule Take 5,000 Units by mouth daily.     clobetasol ointment (TEMOVATE) 2.40 % Apply 1 application topically 2 (two) times daily. Dry area and apply to mouth sores twice daily as needed for up to 2 weeks. 15 g 0   COLLAGEN  PO Take 1,000 mg by mouth daily.     cyclobenzaprine (FLEXERIL) 5 MG tablet Take 1 tablet (5 mg total) by mouth 3 (three) times daily as needed for muscle spasms. 30 tablet 0   diclofenac Sodium (VOLTAREN) 1 % GEL Apply 2 g topically 4 (four) times daily as needed (pain).      fexofenadine (ALLEGRA) 180 MG tablet Take 180 mg by mouth daily as needed for allergies.      fluticasone (FLONASE) 50 MCG/ACT nasal spray Place 2 sprays into both nostrils daily as needed for allergies or rhinitis.     Fluticasone-Salmeterol (ADVAIR) 250-50 MCG/DOSE AEPB Inhale 1 puff into the lungs 2 (two) times daily as needed (for respiratory issues.).     lansoprazole (PREVACID) 30 MG capsule Take 30 mg by mouth daily.      lidocaine-prilocaine (EMLA) cream Apply 1 application topically as needed. Apply generously over the Mediport 45 minutes prior to chemotherapy. 30 g 0   losartan (COZAAR) 25 MG tablet Take 25 mg by mouth at bedtime.      magic mouthwash SOLN Take 5-10 mLs by mouth 4 (four) times daily as needed for mouth pain. 480 mL 0   ondansetron (ZOFRAN) 8 MG tablet Take 1 tablet (8 mg total) by mouth every 8 (eight) hours as needed for nausea or vomiting (start 3 days; after chemo). 40 tablet 1   osimertinib mesylate (TAGRISSO) 80 MG tablet Take 1 tablet (80 mg total) by mouth daily. 30 tablet 5   polyethylene glycol (MIRALAX / GLYCOLAX) packet Take 17 g by mouth daily as needed for mild constipation.     PREMARIN 1.25 MG tablet Take 1.25 mg by mouth daily.   1   prochlorperazine (COMPAZINE) 10 MG tablet TAKE 1 TABLET (10 MG TOTAL) BY MOUTH EVERY 6 (SIX) HOURS AS NEEDED FOR NAUSEA OR VOMITING. 40 tablet 1   triamcinolone (KENALOG) 0.1 % paste Use as directed 1 application in the mouth or throat 2 (two) times daily. 15 g 12   zinc gluconate 50 MG tablet Take 50 mg by mouth daily.     No current  facility-administered medications for this visit.   Facility-Administered Medications Ordered in Other  Visits  Medication Dose Route Frequency Provider Last Rate Last Admin   heparin lock flush 100 unit/mL  500 Units Intravenous Once Grayland Ormond, Kathlene November, MD       sodium chloride flush (NS) 0.9 % injection 10 mL  10 mL Intravenous PRN Lloyd Huger, MD       sodium chloride flush (NS) 0.9 % injection 10 mL  10 mL Intravenous PRN Lloyd Huger, MD   10 mL at 01/22/18 1419    OBJECTIVE: There were no vitals filed for this visit.   There is no height or weight on file to calculate BMI.    ECOG FS:1 - Symptomatic but completely ambulatory  General: Well-developed, well-nourished, no acute distress. HEENT: Normocephalic. Neuro: Alert, answering all questions appropriately. Cranial nerves grossly intact. Psych: Normal affect.   LAB RESULTS:  Lab Results  Component Value Date   NA 134 (L) 02/17/2020   K 4.1 02/17/2020   CL 101 02/17/2020   CO2 24 02/17/2020   GLUCOSE 88 02/17/2020   BUN 16 02/17/2020   CREATININE 0.92 02/17/2020   CALCIUM 8.4 (L) 02/17/2020   PROT 7.5 02/17/2020   ALBUMIN 3.5 02/17/2020   AST 21 02/17/2020   ALT 18 02/17/2020   ALKPHOS 84 02/17/2020   BILITOT 0.5 02/17/2020   GFRNONAA >60 02/17/2020   GFRAA >60 02/17/2020    Lab Results  Component Value Date   WBC 5.3 02/17/2020   NEUTROABS 4.0 02/17/2020   HGB 10.3 (L) 02/17/2020   HCT 30.7 (L) 02/17/2020   MCV 80.4 02/17/2020   PLT 274 02/17/2020     STUDIES: CT Chest W Contrast  Result Date: 02/17/2020 CLINICAL DATA:  Follow-up of RIGHT lung cancer diagnosed in 2015. History of LEFT renal mass ablation. EXAM: CT CHEST, ABDOMEN, AND PELVIS WITH CONTRAST TECHNIQUE: Multidetector CT imaging of the chest, abdomen and pelvis was performed following the standard protocol during bolus administration of intravenous contrast. CONTRAST:  33m OMNIPAQUE IOHEXOL 300 MG/ML  SOLN COMPARISON:  Nov 14, 2019 FINDINGS: CT CHEST FINDINGS Cardiovascular: Mild dilation of the ascending thoracic aorta with  scattered atheromatous changes in the thoracic aorta, similar to the previous exam. RIGHT-sided Port-A-Cath terminates in the distal superior vena cava with similar appearance as well. Distortion of RIGHT hilum due to post treatment changes and disease in the chest. Central pulmonary vasculature is otherwise unremarkable. Mediastinum/Nodes: No axillary lymphadenopathy. No thoracic inlet lymphadenopathy. No hilar adenopathy on the LEFT with diffuse soft tissue about the RIGHT hilum contiguous with pleural metastatic disease. No discrete mediastinal adenopathy. Lungs/Pleura: Since the prior study there has been moderate decrease in RIGHT-sided pleural fluid now mainly sub pulmonic but associated with a nodular pleural rind is circumferential and worse at the RIGHT lung base. Post treatment changes about the RIGHT hilum are contiguous with these findings. No LEFT-sided effusion. Small LEFT lower lobe pulmonary nodule is present measuring approximately 4 mm on image 104 of series 3 but there is resolution of the patchy ground-glass opacities that were seen on the previous exam. Airways are patent in the LEFT chest and narrowed at the RIGHT hilum as before. An example of pleural thickening is seen with 7 mm thickening along the lateral and posterior RIGHT chest (image 46 of series 2 at 7 mm. Musculoskeletal: See below for full musculoskeletal detail. CT ABDOMEN PELVIS FINDINGS Hepatobiliary: No focal, suspicious lesion in the liver with stable low-density focus in the  RIGHT hepatic lobe less than a cm. No pericholecystic stranding or sign of biliary duct distension. The portal vein is patent. Pancreas: Pancreas is normal without ductal dilation or inflammation. Spleen: Spleen normal in size and contour. Adrenals/Urinary Tract: Adrenal glands are normal. Symmetric renal enhancement. Evidence of prior LEFT renal mass ablation with decreasing size of the ablation zone measuring approximately 1.5 x 1.6 cm in greatest axial  dimension previously approximately 2.1 cm. Despite lack of precontrast imaging there are no signs of disease recurrence within the ablated area the urinary bladder is decompressed limiting assessment. No hydronephrosis. Stomach/Bowel: No acute gastrointestinal process to the extent evaluated. Colonic diverticulosis without diverticulitis. Vascular/Lymphatic: Calcified atheromatous plaque of the abdominal aorta, minimal without aneurysmal dilation. No adenopathy in the retroperitoneum. No adenopathy in the upper abdomen. Reproductive: Post hysterectomy.  No adnexal masses. Other: No ascites. Musculoskeletal: No acute bone finding or destructive bone process. IMPRESSION: 1. Marked interval worsening of nodular and circumferential pleural involvement in the RIGHT chest. 2. Interval reduction volume of pleural fluid since the previous study likely relates to thoracentesis or other intervention. 3. Resolution of patchy opacities, likely related to pneumonitis either infectious or inflammatory. 4. Post renal ablation on the LEFT without current signs of disease recurrence. Attention on follow-up. Electronically Signed   By: Zetta Bills M.D.   On: 02/17/2020 17:12   CT Abdomen Pelvis W Contrast  Result Date: 02/17/2020 CLINICAL DATA:  Follow-up of RIGHT lung cancer diagnosed in 2015. History of LEFT renal mass ablation. EXAM: CT CHEST, ABDOMEN, AND PELVIS WITH CONTRAST TECHNIQUE: Multidetector CT imaging of the chest, abdomen and pelvis was performed following the standard protocol during bolus administration of intravenous contrast. CONTRAST:  1m OMNIPAQUE IOHEXOL 300 MG/ML  SOLN COMPARISON:  Nov 14, 2019 FINDINGS: CT CHEST FINDINGS Cardiovascular: Mild dilation of the ascending thoracic aorta with scattered atheromatous changes in the thoracic aorta, similar to the previous exam. RIGHT-sided Port-A-Cath terminates in the distal superior vena cava with similar appearance as well. Distortion of RIGHT hilum due to  post treatment changes and disease in the chest. Central pulmonary vasculature is otherwise unremarkable. Mediastinum/Nodes: No axillary lymphadenopathy. No thoracic inlet lymphadenopathy. No hilar adenopathy on the LEFT with diffuse soft tissue about the RIGHT hilum contiguous with pleural metastatic disease. No discrete mediastinal adenopathy. Lungs/Pleura: Since the prior study there has been moderate decrease in RIGHT-sided pleural fluid now mainly sub pulmonic but associated with a nodular pleural rind is circumferential and worse at the RIGHT lung base. Post treatment changes about the RIGHT hilum are contiguous with these findings. No LEFT-sided effusion. Small LEFT lower lobe pulmonary nodule is present measuring approximately 4 mm on image 104 of series 3 but there is resolution of the patchy ground-glass opacities that were seen on the previous exam. Airways are patent in the LEFT chest and narrowed at the RIGHT hilum as before. An example of pleural thickening is seen with 7 mm thickening along the lateral and posterior RIGHT chest (image 46 of series 2 at 7 mm. Musculoskeletal: See below for full musculoskeletal detail. CT ABDOMEN PELVIS FINDINGS Hepatobiliary: No focal, suspicious lesion in the liver with stable low-density focus in the RIGHT hepatic lobe less than a cm. No pericholecystic stranding or sign of biliary duct distension. The portal vein is patent. Pancreas: Pancreas is normal without ductal dilation or inflammation. Spleen: Spleen normal in size and contour. Adrenals/Urinary Tract: Adrenal glands are normal. Symmetric renal enhancement. Evidence of prior LEFT renal mass ablation with decreasing size of  the ablation zone measuring approximately 1.5 x 1.6 cm in greatest axial dimension previously approximately 2.1 cm. Despite lack of precontrast imaging there are no signs of disease recurrence within the ablated area the urinary bladder is decompressed limiting assessment. No hydronephrosis.  Stomach/Bowel: No acute gastrointestinal process to the extent evaluated. Colonic diverticulosis without diverticulitis. Vascular/Lymphatic: Calcified atheromatous plaque of the abdominal aorta, minimal without aneurysmal dilation. No adenopathy in the retroperitoneum. No adenopathy in the upper abdomen. Reproductive: Post hysterectomy.  No adnexal masses. Other: No ascites. Musculoskeletal: No acute bone finding or destructive bone process. IMPRESSION: 1. Marked interval worsening of nodular and circumferential pleural involvement in the RIGHT chest. 2. Interval reduction volume of pleural fluid since the previous study likely relates to thoracentesis or other intervention. 3. Resolution of patchy opacities, likely related to pneumonitis either infectious or inflammatory. 4. Post renal ablation on the LEFT without current signs of disease recurrence. Attention on follow-up. Electronically Signed   By: Zetta Bills M.D.   On: 02/17/2020 17:12   NM PET Image Restage (PS) Skull Base to Thigh  Result Date: 03/11/2020 CLINICAL DATA:  Subsequent treatment strategy for non-small cell lung cancer. EXAM: NUCLEAR MEDICINE PET SKULL BASE TO THIGH TECHNIQUE: 7.0 mCi F-18 FDG was injected intravenously. Full-ring PET imaging was performed from the skull base to thigh after the radiotracer. CT data was obtained and used for attenuation correction and anatomic localization. Fasting blood glucose: 91 mg/dl COMPARISON:  Multiple exams, including CT scan 02/17/2020 and PET-CT from 07/09/2019 FINDINGS: Mediastinal blood pool activity: SUV max 2.5 Liver activity: SUV max N/A NECK: No significant abnormal hypermetabolic activity in this region. Incidental CT findings: none CHEST: Moderate pleural effusion mainly loculated along the right lung base, with hypermetabolic pleural thickening on the right such that malignant pleural effusion is a distinct possibility. One representative right posterolateral pleural segment has a maximum  SUV of 4.6. Right perihilar activity which could be an collapse along or along the pleural margin of the hila has maximum SUV of 5.7, previously 3.8. Newly hypermetabolic left supraclavicular lymph node measuring 0.8 cm in short axis on image 54/3 (previously 0.5 cm) has maximum SUV of 4.8 (previously not hypermetabolic). A region of consolidation along the pleural margin adjacent to the right heart border along the right middle lobe has maximum SUV of 7.4, possibly from radiation pneumonitis although malignant involvement is not excluded. Incidental CT findings: Atherosclerotic thoracic aorta. Ectatic ascending thoracic aorta at 3.8 cm in diameter on image 92/3. Mild cardiomegaly. Right perihilar consolidation, some of which may be from radiation pneumonitis. ABDOMEN/PELVIS: Hypermetabolic left periaortic adenopathy including a 1.3 cm lymph node and a 1.2 cm adjacent lymph node on images 153 in 154 of series 3. Conglomerate maximum SUV 11.9, compatible with malignancy. Left mid kidney ablation site posterolaterally. Incidental CT findings: Mild sigmoid colon diverticulosis. SKELETON: No significant abnormal hypermetabolic activity in this region. Incidental CT findings: Cervical, thoracic, and lumbar spondylosis. IMPRESSION: 1. Newly hypermetabolic left periaortic adenopathy compatible with malignancy. 2. New hypermetabolic left supraclavicular adenopathy compatible with malignancy. 3. Thick pleural rind on the right is hypermetabolic and suspicious for malignant effusion. There are regions of accentuated density in the right paramediastinal and right perihilar consolidation which could be from radiation therapy or malignancy. 4. Other imaging findings of potential clinical significance: Aortic Atherosclerosis (ICD10-I70.0). Mild cardiomegaly. Mild sigmoid colon diverticulosis. Spondylosis. Electronically Signed   By: Van Clines M.D.   On: 03/11/2020 15:54   US BREAST LTD UNI RIGHT INC AXILLA  Result  Date: 03/03/2020 CLINICAL DATA:  Recall from screening mammography with tomosynthesis, possible asymmetry in the LOWER subareolar RIGHT breast visible only on the MLO images. Current history of lung cancer metastatic to the kidney. EXAM: DIGITAL DIAGNOSTIC RIGHT MAMMOGRAM WITH CAD AND TOMO ULTRASOUND RIGHT BREAST COMPARISON:  Previous exam(s). ACR Breast Density Category c: The breast tissue is heterogeneously dense, which may obscure small masses. FINDINGS: Tomosynthesis and synthesized spot-compression MLO view of the area of concern in the RIGHT breast and a tomosynthesis and synthesized full field mediolateral view of the RIGHT breast were obtained. The asymmetry in the lower subareolar location questioned on screening mammography disperses with compression, indicating overlapping normal dense fibroglandular tissue. There is no visible underlying mass or architectural distortion. Dense fibroglandular tissue is present in this location. Note is made of a circumscribed isodense mass measuring approximately 8-9 mm which localizes to the [place] on the screening CC images and which has been stable over multiple prior mammograms, indicating benignity. The full field mediolateral image was processed with CAD. Targeted RIGHT breast ultrasound is performed, showing normal dense fibroglandular tissue in the lower subareolar location with imaging from 4 o'clock through 6 o'clock to 8 o'clock. The circumscribed oval parallel nearly anechoic mass with scattered internal echoes at the 8 o'clock position approximately 4 cm from the nipple measuring approximately 8 x 4 x 7 mm corresponds to the mammographically stable mass. No suspicious solid mass or abnormal acoustic shadowing is identified. IMPRESSION: No mammographic or sonographic evidence of malignancy involving the RIGHT breast. RECOMMENDATION: Screening mammogram in one year.(Code:[id number]-B-01Y) I have discussed the findings and recommendations with the  patient. If applicable, a reminder letter will be sent to the patient regarding the next appointment. BI-RADS CATEGORY  2: Benign. Electronically Signed   By: Evangeline Dakin M.D.   On: 03/03/2020 11:56   MM DIAG BREAST TOMO UNI RIGHT  Result Date: 03/03/2020 CLINICAL DATA:  Recall from screening mammography with tomosynthesis, possible asymmetry in the LOWER subareolar RIGHT breast visible only on the MLO images. Current history of lung cancer metastatic to the kidney. EXAM: DIGITAL DIAGNOSTIC RIGHT MAMMOGRAM WITH CAD AND TOMO ULTRASOUND RIGHT BREAST COMPARISON:  Previous exam(s). ACR Breast Density Category c: The breast tissue is heterogeneously dense, which may obscure small masses. FINDINGS: Tomosynthesis and synthesized spot-compression MLO view of the area of concern in the RIGHT breast and a tomosynthesis and synthesized full field mediolateral view of the RIGHT breast were obtained. The asymmetry in the lower subareolar location questioned on screening mammography disperses with compression, indicating overlapping normal dense fibroglandular tissue. There is no visible underlying mass or architectural distortion. Dense fibroglandular tissue is present in this location. Note is made of a circumscribed isodense mass measuring approximately 8-9 mm which localizes to the [place] on the screening CC images and which has been stable over multiple prior mammograms, indicating benignity. The full field mediolateral image was processed with CAD. Targeted RIGHT breast ultrasound is performed, showing normal dense fibroglandular tissue in the lower subareolar location with imaging from 4 o'clock through 6 o'clock to 8 o'clock. The circumscribed oval parallel nearly anechoic mass with scattered internal echoes at the 8 o'clock position approximately 4 cm from the nipple measuring approximately 8 x 4 x 7 mm corresponds to the mammographically stable mass. No suspicious solid mass or abnormal acoustic  shadowing is identified. IMPRESSION: No mammographic or sonographic evidence of malignancy involving the RIGHT breast. RECOMMENDATION: Screening mammogram in one year.(Code:[id number]) I have discussed the  findings and recommendations with the patient. If applicable, a reminder letter will be sent to the patient regarding the next appointment. BI-RADS CATEGORY  2: Benign. Electronically Signed   By: Evangeline Dakin M.D.   On: 03/03/2020 11:56    ONCOLOGY HISTORY: Patient initially underwent resection in May 2015 and was noted to have the EGFR mutation.  She initially received 4 cycles of adjuvant cisplatin and Alimta completing on January 23, 2014.  Patient was on the [facility] protocol for maintenance Tarceva versus placebo, but discontinued treatment in November 2015 because of significant side effects.  Although blinded, presumption was she was receiving Tarceva.  She recently was noted to have a recurrence confirmed by right paratracheal lymph node biopsy on January 24, 2017.  PET scan on February 03, 2017 did not reveal any distant metastasis.  She underwent treatment with weekly carboplatinum and Taxol along with daily XRT finishing in October 2018.  She initiated maintenance durvalumab on June 29, 2017.  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  Patient received Tagrisso from April 2019 through September 2021.  She then initiated single agent pemetrexed and September 2021.    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  PLAN:   1. 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  PET scan results from July 09, 2019 reviewed independently which revealed mild progression of disease in her lungs with an isolated left kidney metastasis that has undergone ablation.  Her most recent PET scan on March 11, 2020 reviewed independently and reported as above  revealed continued progression of disease in her lungs.  Discontinue Tagrisso and initiate single agent pemetrexed.  If there is not an adequate response, can consider adding in cisplatin or carboplatin.  Return to clinic in 1 week to initiate cycle 1.   2.  Diarrhea: Patient does not complain of this today.  Continue Lomotil as prescribed. 3.  Cough: Chronic and unchanged, monitor. 4.  Breast lesion: Benign papilloma.  Patient underwent lumpectomy on March 01, 2019.  Her most recent mammogram on March 03, 2020 was reported as BI-RADS 2.   5.  Right pleural effusion: Malignant.  Patient had repeat thoracentesis in May 2021.  Pemetrexed as above.  I provided 30 minutes of face-to-face video visit time during this encounter which included chart review, counseling, and coordination of care as documented above.    Patient expressed understanding and was in agreement with this plan. She also understands that She can call clinic at any time with any questions, concerns, or complaints.    Lloyd Huger, MD   03/14/2020 7:56 AM

## 2020-03-20 NOTE — Progress Notes (Signed)
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  INTERVAL HISTORY: Patient returns to clinic today for further evaluation and initiation of cycle 1 of single agent pemetrexed.  She continues to have weakness and fatigue.  She also is complaining of occasional nausea.  She does not complain of shortness of breath. She has no neurologic complaints.  She denies any recent fevers or illnesses.  She has a good appetite and denies weight loss.  She denies any chest pain, cough, or hemoptysis.  She denies any vomiting, constipation, or diarrhea.  She has no urinary complaints.  Patient offers no further specific complaints today.  REVIEW OF SYSTEMS:   Review of Systems  Constitutional: Positive for malaise/fatigue. Negative for fever and weight loss.  Respiratory: Negative.  Negative for cough, hemoptysis and shortness of breath.   Cardiovascular: Negative.  Negative for chest pain and leg swelling.  Gastrointestinal: Positive for nausea. Negative for abdominal pain, constipation and diarrhea.  Genitourinary: Negative.  Negative for dysuria and flank pain.  Musculoskeletal: Negative.  Negative for back pain and joint pain.  Skin: Negative.  Negative for rash.  Neurological: Positive for weakness. Negative for sensory change, focal weakness and headaches.  Psychiatric/Behavioral: The patient is nervous/anxious.     As per HPI. Otherwise, a complete review of systems is negative.  PAST MEDICAL HISTORY: Past Medical History:  Diagnosis Date   Abnormal echocardiogram    Acid reflux    Allergic rhinitis    Anemia    HAD TO HAVE IRON INFUSIONS BACK IN 2015    Arthritis    bil hands and back   Asthma    Bloody discharge from left nipple    Cancer of right lung ([place]) 2015   lung cancer - chemo, Dr Genevive Bi removed RML Lobectomy.    Colon polyps    COPD (chronic obstructive pulmonary disease) (HCC)    Dyspnea    WITH EXERTION   Family history of breast cancer    Family history of cervical cancer    Family history of colon cancer    Family history of skin cancer    Hypertension    Malignant neoplasm of middle lobe of right lung ([place])    Mitral valve prolapse    Mitral valve prolapse    Non-small cell lung cancer ([place])    Personal history of chemotherapy    F/U Lung Cancer   Personal history of radiation therapy    Lung cancer   Primary cancer of right middle lobe of lung ([place]) 03/11/2016   Squamous cell carcinoma of skin 07/07/2009   right supraclavicular/EDC   Stage IV adenocarcinoma of lung, unspecified laterality ([place])     PAST SURGICAL HISTORY: Past Surgical History:  Procedure Laterality Date   ABDOMINAL HYSTERECTOMY     BAND HEMORRHOIDECTOMY     BREAST BIOPSY Left 03/01/2019   Procedure: LEFT BREAST EXCISIONAL BIOPSY WITH NEEDLE LOCALIZATION;  Surgeon: Herbert Pun, MD;  Location: [facility] OR[facility];  Service: General;  Laterality: Left;   BREAST EXCISIONAL BIOPSY Left 1994 (-), 2004 (-)   benign   BREAST EXCISIONAL BIOPSY Left 03/01/2019   - INTRADUCTAL PAPILLOMA WITH SCLEROSIS.    COLONOSCOPY  2008    Dr.  COLONOSCOPY WITH PROPOFOL N/A 04/12/2019  ° Procedure: COLONOSCOPY WITH PROPOFOL;  Surgeon: Skulskie, [name] U, MD;  Location: [facility];  Service: Endoscopy;  Laterality: N/A;  °• IR RADIOLOGIST EVAL & MGMT  08/28/2019  °• IR RADIOLOGIST EVAL & MGMT  10/30/2019  °• IR RADIOLOGIST EVAL & MGMT  11/26/2019  °• KNEE SURGERY  2007  °• LUNG LOBECTOMY Right   ° middle lobe  °• PORTACATH PLACEMENT Right 02/09/2017  ° Procedure: INSERTION PORT-A-CATH;  Surgeon: Oaks, , MD;  Location: [facility];   Service: General;  Laterality: Right;  °• RADIOLOGY WITH ANESTHESIA N/A 10/02/2019  ° Procedure: MICROWAVE THERMA ABLATION;  Surgeon: Yamagata, Glenn, MD;  Location: [facility];  Service: Radiology;  Laterality: N/A;  °• SALIVARY GLAND SURGERY Left 1983  ° with lymph node removal also  °• SALIVARY GLAND SURGERY    ° salivary gland removal   °• UPPER GI ENDOSCOPY  2008  °• VIDEO BRONCHOSCOPY WITH ENDOBRONCHIAL ULTRASOUND Right 01/24/2017  ° Procedure: VIDEO BRONCHOSCOPY WITH ENDOBRONCHIAL ULTRASOUND;  Surgeon: Ramachandran, Pradeep, MD;  Location: [facility];  Service: Pulmonary;  Laterality: Right;  ° ° °FAMILY HISTORY: °Family History  °Problem Relation Age of Onset  °• Breast cancer Maternal Aunt 60  °• Alzheimer's disease Maternal Aunt   °• Breast cancer Paternal Aunt   °     dx. in her late 50s  °• Breast cancer Maternal Grandmother   °     dx. in her 60s  °• Cervical cancer Maternal Grandmother 60  °• Colon cancer Maternal Grandmother   °     dx. in her 60s  °• Breast cancer Maternal Aunt 60  °• Alzheimer's disease Maternal Aunt   °• Breast cancer Paternal Aunt   °     dx. in her late 50s  °• Heart disease Paternal Aunt   °• Healthy Mother   °• Healthy Father   °• Colon cancer Father 60  °• Skin cancer Father   °• Heart attack Father   °• Heart attack Paternal Grandfather   °• Skin cancer Paternal Aunt   °• Heart attack Paternal Uncle 32  °• Heart attack Paternal Uncle   ° ° °ADVANCED DIRECTIVES (Y/N):  N ° °HEALTH MAINTENANCE: °Social History  ° °Tobacco Use  °• Smoking status: Never Smoker  °• Smokeless tobacco: Never Used  °Vaping Use  °• Vaping Use: Never used  °Substance Use Topics  °• Alcohol use: Yes  °  Alcohol/week: 7.0 standard drinks  °  Types: 7 Glasses of wine per week  °• Drug use: No  ° ° ° Colonoscopy: ° PAP: ° Bone density: ° Lipid panel: ° °Allergies  °Allergen Reactions  °• Other Other (See Comments)  °  Cat gut sutures get infected °Cat gut sutures get infected  °• Penicillins Swelling and Rash  °   Did it involve swelling of the face/tongue/throat, SOB, or low BP? Unknown °Did it involve sudden or severe rash/hives, skin peeling, or any reaction on the inside of your mouth or nose? No °Did you need to seek medical attention at a hospital or doctor's office? Yes °When did it last happen?      30 + years  °If all above answers are “NO”, may proceed with cephalosporin use. ° °  °• Sulfa Antibiotics Nausea And Vomiting  ° ° °Current Outpatient Medications  °Medication Sig Dispense Refill  °• albuterol (PROVENTIL HFA;VENTOLIN HFA) 108 (90 Base) MCG/ACT inhaler Inhale 1-2 puffs into the lungs every 6 (six) hours as needed for wheezing or   shortness of breath.    °• ascorbic acid (VITAMIN C) 100 MG tablet Take 100 mg by mouth daily.     °• B Complex Vitamins (VITAMIN B COMPLEX PO) Take 1 tablet by mouth daily.     °• Biotin w/ Vitamins C & E (HAIR/SKIN/NAILS PO) Take 1 tablet by mouth daily.    °• Calcium Carb-Cholecalciferol (CALCIUM 600+D) 600-800 MG-UNIT TABS Take 2 tablets by mouth daily.    °• Cholecalciferol (D3 MAXIMUM STRENGTH) 5000 units capsule Take 5,000 Units by mouth daily.    °• clobetasol ointment (TEMOVATE) 0.05 % Apply 1 application topically 2 (two) times daily. Dry area and apply to mouth sores twice daily as needed for up to 2 weeks. 15 g 0  °• COLLAGEN PO Take 1,000 mg by mouth daily.    °• cyclobenzaprine (FLEXERIL) 5 MG tablet Take 1 tablet (5 mg total) by mouth 3 (three) times daily as needed for muscle spasms. 30 tablet 0  °• diclofenac Sodium (VOLTAREN) 1 % GEL Apply 2 g topically 4 (four) times daily as needed (pain).     °• fexofenadine (ALLEGRA) 180 MG tablet Take 180 mg by mouth daily as needed for allergies.     °• fluticasone (FLONASE) 50 MCG/ACT nasal spray Place 2 sprays into both nostrils daily as needed for allergies or rhinitis.    °• Fluticasone-Salmeterol (ADVAIR) 250-50 MCG/DOSE AEPB Inhale 1 puff into the lungs 2 (two) times daily as needed (for respiratory issues.).    °•  folic acid (FOLVITE) 1 MG tablet Take 1 tablet (1 mg total) by mouth daily. Start 5-7 days before Alimta chemotherapy. Continue until 21 days after Alimta completed. 100 tablet 3  °• lansoprazole (PREVACID) 30 MG capsule Take 30 mg by mouth daily.     °• lidocaine-prilocaine (EMLA) cream Apply 1 application topically as needed. Apply generously over the Mediport 45 minutes prior to chemotherapy. 30 g 0  °• lidocaine-prilocaine (EMLA) cream Apply to affected area once 30 g 3  °• losartan (COZAAR) 25 MG tablet Take 25 mg by mouth at bedtime.     °• magic mouthwash SOLN Take 5-10 mLs by mouth 4 (four) times daily as needed for mouth pain. 480 mL 0  °• ondansetron (ZOFRAN) 8 MG tablet Take 1 tablet (8 mg total) by mouth every 8 (eight) hours as needed for nausea or vomiting (start 3 days; after chemo). 40 tablet 1  °• ondansetron (ZOFRAN) 8 MG tablet Take 1 tablet (8 mg total) by mouth 2 (two) times daily as needed (Nausea or vomiting). 60 tablet 1  °• osimertinib mesylate (TAGRISSO) 80 MG tablet Take 1 tablet (80 mg total) by mouth daily. 30 tablet 5  °• polyethylene glycol (MIRALAX / GLYCOLAX) packet Take 17 g by mouth daily as needed for mild constipation.    °• PREMARIN 1.25 MG tablet Take 1.25 mg by mouth daily.   1  °• prochlorperazine (COMPAZINE) 10 MG tablet TAKE 1 TABLET (10 MG TOTAL) BY MOUTH EVERY 6 (SIX) HOURS AS NEEDED FOR NAUSEA OR VOMITING. 40 tablet 1  °• prochlorperazine (COMPAZINE) 10 MG tablet Take 1 tablet (10 mg total) by mouth every 6 (six) hours as needed (Nausea or vomiting). 60 tablet 1  °• triamcinolone (KENALOG) 0.1 % paste Use as directed 1 application in the mouth or throat 2 (two) times daily. 15 g 12  °• zinc gluconate 50 MG tablet Take 50 mg by mouth daily.    ° °No current facility-administered medications for this visit.  ° °  for this visit.   Facility-Administered Medications Ordered in Other Visits  Medication Dose Route Frequency Provider Last Rate Last Admin   heparin lock flush 100 unit/mL  500  Units Intravenous Once Grayland Ormond, Kathlene November, MD       heparin lock flush 100 unit/mL  500 Units Intravenous Once Grayland Ormond, Kathlene November, MD       heparin lock flush 100 unit/mL  500 Units Intracatheter Once PRN Lloyd Huger, MD       PEMEtrexed (ALIMTA) 800 mg in sodium chloride 0.9 % 100 mL chemo infusion  500 mg/m2 (Order-Specific) Intravenous Once Lloyd Huger, MD 792 mL/hr at 03/23/20 1235 800 mg at 03/23/20 1235   sodium chloride flush (NS) 0.9 % injection 10 mL  10 mL Intravenous PRN Lloyd Huger, MD       sodium chloride flush (NS) 0.9 % injection 10 mL  10 mL Intravenous PRN Lloyd Huger, MD   10 mL at 01/22/18 1419   sodium chloride flush (NS) 0.9 % injection 10 mL  10 mL Intravenous PRN Lloyd Huger, MD   10 mL at 03/23/20 1050    OBJECTIVE: Vitals:   03/23/20 1146  BP: (!) 129/94  Pulse: (!) 102  Resp: 20  Temp: 99 F (37.2 C)  SpO2: 100%     Body mass index is 22.62 kg/m.    ECOG FS:1 - Symptomatic but completely ambulatory  General: Well-developed, well-nourished, no acute distress. Eyes: Pink conjunctiva, anicteric sclera. HEENT: Normocephalic, moist mucous membranes. Lungs: No audible wheezing or coughing. Heart: Regular rate and rhythm. Abdomen: Soft, nontender, no obvious distention. Musculoskeletal: No edema, cyanosis, or clubbing. Neuro: Alert, answering all questions appropriately. Cranial nerves grossly intact. Skin: No rashes or petechiae noted. Psych: Normal affect.   LAB RESULTS:  Lab Results  Component Value Date   NA 136 03/23/2020   K 4.0 03/23/2020   CL 103 03/23/2020   CO2 24 03/23/2020   GLUCOSE 101 (H) 03/23/2020   BUN 15 03/23/2020   CREATININE 0.75 03/23/2020   CALCIUM 8.6 (L) 03/23/2020   PROT 7.2 03/23/2020   ALBUMIN 3.1 (L) 03/23/2020   AST 23 03/23/2020   ALT 18 03/23/2020   ALKPHOS 89 03/23/2020   BILITOT 0.5 03/23/2020   GFRNONAA >60 03/23/2020   GFRAA >60 03/23/2020    Lab Results   Component Value Date   WBC 6.6 03/23/2020   NEUTROABS 5.0 03/23/2020   HGB 10.0 (L) 03/23/2020   HCT 29.2 (L) 03/23/2020   MCV 79.3 (L) 03/23/2020   PLT 274 03/23/2020     STUDIES: NM PET Image Restage (PS) Skull Base to Thigh  Result Date: 03/11/2020 CLINICAL DATA:  Subsequent treatment strategy for non-small cell lung cancer. EXAM: NUCLEAR MEDICINE PET SKULL BASE TO THIGH TECHNIQUE: 7.0 mCi F-18 FDG was injected intravenously. Full-ring PET imaging was performed from the skull base to thigh after the radiotracer. CT data was obtained and used for attenuation correction and anatomic localization. Fasting blood glucose: 91 mg/dl COMPARISON:  Multiple exams, including CT scan 02/17/2020 and PET-CT from 07/09/2019 FINDINGS: Mediastinal blood pool activity: SUV max 2.5 Liver activity: SUV max N/A NECK: No significant abnormal hypermetabolic activity in this region. Incidental CT findings: none CHEST: Moderate pleural effusion mainly loculated along the right lung base, with hypermetabolic pleural thickening on the right such that malignant pleural effusion is a distinct possibility. One representative right posterolateral pleural segment has a maximum SUV of 4.6. Right perihilar activity which could be  an collapse along or along the pleural margin of the hila has maximum SUV of 5.7, previously 3.8. Newly hypermetabolic left supraclavicular lymph node measuring 0.8 cm in short axis on image 54/3 (previously 0.5 cm) has maximum SUV of 4.8 (previously not hypermetabolic). A region of consolidation along the pleural margin adjacent to the right heart border along the right middle lobe has maximum SUV of 7.4, possibly from radiation pneumonitis although malignant involvement is not excluded. Incidental CT findings: Atherosclerotic thoracic aorta. Ectatic ascending thoracic aorta at 3.8 cm in diameter on image 92/3. Mild cardiomegaly. Right perihilar consolidation, some of which may be from radiation  pneumonitis. ABDOMEN/PELVIS: Hypermetabolic left periaortic adenopathy including a 1.3 cm lymph node and a 1.2 cm adjacent lymph node on images 153 in 154 of series 3. Conglomerate maximum SUV 11.9, compatible with malignancy. Left mid kidney ablation site posterolaterally. Incidental CT findings: Mild sigmoid colon diverticulosis. SKELETON: No significant abnormal hypermetabolic activity in this region. Incidental CT findings: Cervical, thoracic, and lumbar spondylosis. IMPRESSION: 1. Newly hypermetabolic left periaortic adenopathy compatible with malignancy. 2. New hypermetabolic left supraclavicular adenopathy compatible with malignancy. 3. Thick pleural rind on the right is hypermetabolic and suspicious for malignant effusion. There are regions of accentuated density in the right paramediastinal and right perihilar consolidation which could be from radiation therapy or malignancy. 4. Other imaging findings of potential clinical significance: Aortic Atherosclerosis (ICD10-I70.0). Mild cardiomegaly. Mild sigmoid colon diverticulosis. Spondylosis. Electronically Signed   By: Van Clines M.D.   On: 03/11/2020 15:54   US BREAST LTD UNI RIGHT INC AXILLA  Result Date: 03/03/2020 CLINICAL DATA:  Recall from screening mammography with tomosynthesis, possible asymmetry in the LOWER subareolar RIGHT breast visible only on the MLO images. Current history of lung cancer metastatic to the kidney. EXAM: DIGITAL DIAGNOSTIC RIGHT MAMMOGRAM WITH CAD AND TOMO ULTRASOUND RIGHT BREAST COMPARISON:  Previous exam(s). ACR Breast Density Category c: The breast tissue is heterogeneously dense, which may obscure small masses. FINDINGS: Tomosynthesis and synthesized spot-compression MLO view of the area of concern in the RIGHT breast and a tomosynthesis and synthesized full field mediolateral view of the RIGHT breast were obtained. The asymmetry in the lower subareolar location questioned on screening mammography disperses with  compression, indicating overlapping normal dense fibroglandular tissue. There is no visible underlying mass or architectural distortion. Dense fibroglandular tissue is present in this location. Note is made of a circumscribed isodense mass measuring approximately 8-9 mm which localizes to the [place] on the screening CC images and which has been stable over multiple prior mammograms, indicating benignity. The full field mediolateral image was processed with CAD. Targeted RIGHT breast ultrasound is performed, showing normal dense fibroglandular tissue in the lower subareolar location with imaging from 4 o'clock through 6 o'clock to 8 o'clock. The circumscribed oval parallel nearly anechoic mass with scattered internal echoes at the 8 o'clock position approximately 4 cm from the nipple measuring approximately 8 x 4 x 7 mm corresponds to the mammographically stable mass. No suspicious solid mass or abnormal acoustic shadowing is identified. IMPRESSION: No mammographic or sonographic evidence of malignancy involving the RIGHT breast. RECOMMENDATION: Screening mammogram in one year.(Code:[id number]-B-01Y) I have discussed the findings and recommendations with the patient. If applicable, a reminder letter will be sent to the patient regarding the next appointment. BI-RADS CATEGORY  2: Benign. Electronically Signed   By: Evangeline Dakin M.D.   On: 03/03/2020 11:56   MM DIAG BREAST TOMO UNI RIGHT  Result Date: 03/03/2020 CLINICAL DATA:  tomosynthesis, possible asymmetry in the LOWER subareolar RIGHT breast visible only on the MLO images. Current history of lung cancer metastatic to the kidney. EXAM: DIGITAL DIAGNOSTIC RIGHT MAMMOGRAM WITH CAD AND TOMO ULTRASOUND RIGHT BREAST COMPARISON:  Previous exam(s). ACR Breast Density Category c: The breast tissue is heterogeneously dense, which may obscure small masses. FINDINGS: Tomosynthesis and synthesized spot-compression MLO view  of the area of concern in the RIGHT breast and a tomosynthesis and synthesized full field mediolateral view of the RIGHT breast were obtained. The asymmetry in the lower subareolar location questioned on screening mammography disperses with compression, indicating overlapping normal dense fibroglandular tissue. There is no visible underlying mass or architectural distortion. Dense fibroglandular tissue is present in this location. Note is made of a circumscribed isodense mass measuring approximately 8-9 mm which localizes to the LOWER OUTER QUADRANT on the screening CC images and which has been stable over multiple prior mammograms, indicating benignity. The full field mediolateral image was processed with CAD. Targeted RIGHT breast ultrasound is performed, showing normal dense fibroglandular tissue in the lower subareolar location with imaging from 4 o'clock through 6 o'clock to 8 o'clock. The circumscribed oval parallel nearly anechoic mass with scattered internal echoes at the 8 o'clock position approximately 4 cm from the nipple measuring approximately 8 x 4 x 7 mm corresponds to the mammographically stable mass. No suspicious solid mass or abnormal acoustic shadowing is identified. IMPRESSION: No mammographic or sonographic evidence of malignancy involving the RIGHT breast. RECOMMENDATION: Screening mammogram in one year.(Code:[id number]) I have discussed the findings and recommendations with the patient. If applicable, a reminder letter will be sent to the patient regarding the next appointment. BI-RADS CATEGORY  2: Benign. Electronically Signed   By: Thomas  Lawrence M.D.   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  She initially received 4 cycles of adjuvant cisplatin and Alimta completing on January 23, 2014.  Patient was on the [facility] protocol for maintenance Tarceva versus placebo, but discontinued treatment in  November 2015 because of significant side effects.  Although blinded, presumption was she was receiving Tarceva.  She recently was noted to have a recurrence confirmed by right paratracheal lymph node biopsy on January 24, 2017.  PET scan on February 03, 2017 did not reveal any distant metastasis.  She underwent treatment with weekly carboplatinum and Taxol along with daily XRT finishing in October 2018.  She initiated maintenance durvalumab on June 29, 2017.  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  Patient received Tagrisso from April 2019 through September 2021.  She then initiated single agent pemetrexed on March 23, 2020.   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 °  °PLAN:  °  °1. 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  PET scan results from July 09, 2019 reviewed independently which revealed mild progression of disease in her lungs with an isolated left kidney metastasis that has undergone ablation.  Her most recent PET scan on March 11, 2020 reviewed independently and reported as above revealed continued progression of disease in her lungs.  Discontinue Tagrisso and initiate single agent pemetrexed.  If there is not an adequate response, can consider adding in cisplatin or carboplatin.  Will also send recent pathology results for Omnisec testing to assess whether any bypass mutations are evident.    Proceed with cycle 1 of pemetrexed today.  Return to clinic in 1 week for laboratory work and video assisted telemedicine visit and then in 3 weeks for further evaluation and consideration of cycle 2.   °2.  Diarrhea: Patient does not complain of this today.  Continue Lomotil as prescribed. °3.  Cough: Patient does not complain of this today.  Chronic and unchanged, monitor. °4.  Breast lesion: Benign papilloma.  Patient underwent  lumpectomy on March 01, 2019.  Her most recent mammogram on March 03, 2020 was reported as BI-RADS 2.   °5.  Right pleural effusion: Malignant.  Patient had repeat thoracentesis in May 2021.  Pemetrexed as above. °6.  Nausea: Continue current antiemetics as needed. °7.  Anemia: Patient's hemoglobin is 10.0 today.  Monitor. ° °Patient expressed understanding and was in agreement with this plan. She also understands that She can call clinic at any time with any questions, concerns, or complaints.  ° ° ° J , MD   03/23/2020 12:44 PM ° ° ° ° °

## 2020-03-23 ENCOUNTER — Other Ambulatory Visit: Payer: Self-pay

## 2020-03-23 ENCOUNTER — Inpatient Hospital Stay (HOSPITAL_BASED_OUTPATIENT_CLINIC_OR_DEPARTMENT_OTHER): Payer: Medicare Other | Admitting: Oncology

## 2020-03-23 ENCOUNTER — Inpatient Hospital Stay: Payer: Medicare Other

## 2020-03-23 ENCOUNTER — Encounter: Payer: Self-pay | Admitting: Oncology

## 2020-03-23 VITALS — BP 129/94 | HR 102 | Temp 99.0°F | Resp 20 | Ht 63.0 in | Wt 127.7 lb

## 2020-03-23 DIAGNOSIS — Z5111 Encounter for antineoplastic chemotherapy: Secondary | ICD-10-CM | POA: Diagnosis not present

## 2020-03-23 DIAGNOSIS — C3490 Malignant neoplasm of unspecified part of unspecified bronchus or lung: Secondary | ICD-10-CM | POA: Diagnosis present

## 2020-03-23 DIAGNOSIS — R197 Diarrhea, unspecified: Secondary | ICD-10-CM | POA: Diagnosis not present

## 2020-03-23 DIAGNOSIS — J910 Malignant pleural effusion: Secondary | ICD-10-CM | POA: Diagnosis not present

## 2020-03-23 DIAGNOSIS — C7902 Secondary malignant neoplasm of left kidney and renal pelvis: Secondary | ICD-10-CM | POA: Diagnosis not present

## 2020-03-23 DIAGNOSIS — D649 Anemia, unspecified: Secondary | ICD-10-CM | POA: Diagnosis not present

## 2020-03-23 LAB — COMPREHENSIVE METABOLIC PANEL
ALT: 18 U/L (ref 0–44)
AST: 23 U/L (ref 15–41)
Albumin: 3.1 g/dL — ABNORMAL LOW (ref 3.5–5.0)
Alkaline Phosphatase: 89 U/L (ref 38–126)
Anion gap: 9 (ref 5–15)
BUN: 15 mg/dL (ref 8–23)
CO2: 24 mmol/L (ref 22–32)
Calcium: 8.6 mg/dL — ABNORMAL LOW (ref 8.9–10.3)
Chloride: 103 mmol/L (ref 98–111)
Creatinine, Ser: 0.75 mg/dL (ref 0.44–1.00)
GFR calc Af Amer: >60 mL/min (ref >60)
GFR calc non Af Amer: >60 mL/min (ref >60)
Glucose, Bld: 101 mg/dL — ABNORMAL HIGH (ref 70–99)
Potassium: 4 mmol/L (ref 3.5–5.1)
Sodium: 136 mmol/L (ref 135–145)
Total Bilirubin: 0.5 mg/dL (ref 0.3–1.2)
Total Protein: 7.2 g/dL (ref 6.5–8.1)

## 2020-03-23 LAB — CBC WITH DIFFERENTIAL/PLATELET
Abs Immature Granulocytes: 0.02 10*3/uL (ref 0.00–0.07)
Basophils Absolute: 0 10*3/uL (ref 0.0–0.1)
Basophils Relative: 1 %
Eosinophils Absolute: 0.2 10*3/uL (ref 0.0–0.5)
Eosinophils Relative: 3 %
HCT: 29.2 % — ABNORMAL LOW (ref 36.0–46.0)
Hemoglobin: 10 g/dL — ABNORMAL LOW (ref 12.0–15.0)
Immature Granulocytes: 0 %
Lymphocytes Relative: 13 %
Lymphs Abs: 0.8 10*3/uL (ref 0.7–4.0)
MCH: 27.2 pg (ref 26.0–34.0)
MCHC: 34.2 g/dL (ref 30.0–36.0)
MCV: 79.3 fL — ABNORMAL LOW (ref 80.0–100.0)
Monocytes Absolute: 0.5 10*3/uL (ref 0.1–1.0)
Monocytes Relative: 8 %
Neutro Abs: 5 10*3/uL (ref 1.7–7.7)
Neutrophils Relative %: 75 %
Platelets: 274 10*3/uL (ref 150–400)
RBC: 3.68 MIL/uL — ABNORMAL LOW (ref 3.87–5.11)
RDW: 15.5 % (ref 11.5–15.5)
WBC: 6.6 10*3/uL (ref 4.0–10.5)
nRBC: 0 % (ref 0.0–0.2)

## 2020-03-23 LAB — PHOSPHORUS: Phosphorus: 3.4 mg/dL (ref 2.5–4.6)

## 2020-03-23 LAB — MAGNESIUM: Magnesium: 2 mg/dL (ref 1.7–2.4)

## 2020-03-23 MED ORDER — CYANOCOBALAMIN 1000 MCG/ML IJ SOLN
1000.0000 ug | Freq: Once | INTRAMUSCULAR | Status: AC
  Administered 2020-03-23: 1000 ug via INTRAMUSCULAR
  Filled 2020-03-23: qty 1

## 2020-03-23 MED ORDER — HEPARIN SOD (PORK) LOCK FLUSH 100 UNIT/ML IV SOLN
500.0000 [IU] | Freq: Once | INTRAVENOUS | Status: DC | PRN
  Filled 2020-03-23: qty 5

## 2020-03-23 MED ORDER — SODIUM CHLORIDE 0.9% FLUSH
10.0000 mL | INTRAVENOUS | Status: DC | PRN
  Administered 2020-03-23: 10 mL via INTRAVENOUS
  Filled 2020-03-23: qty 10

## 2020-03-23 MED ORDER — SODIUM CHLORIDE 0.9 % IV SOLN
500.0000 mg/m2 | Freq: Once | INTRAVENOUS | Status: AC
  Administered 2020-03-23: 800 mg via INTRAVENOUS
  Filled 2020-03-23: qty 12

## 2020-03-23 MED ORDER — HEPARIN SOD (PORK) LOCK FLUSH 100 UNIT/ML IV SOLN
500.0000 [IU] | Freq: Once | INTRAVENOUS | Status: AC
  Administered 2020-03-23: 500 [IU] via INTRAVENOUS
  Filled 2020-03-23: qty 5

## 2020-03-23 MED ORDER — SODIUM CHLORIDE 0.9 % IV SOLN
Freq: Once | INTRAVENOUS | Status: AC
  Filled 2020-03-23: qty 250

## 2020-03-23 MED ORDER — PROCHLORPERAZINE MALEATE 10 MG PO TABS
10.0000 mg | ORAL_TABLET | Freq: Once | ORAL | Status: AC
  Administered 2020-03-23: 10 mg via ORAL
  Filled 2020-03-23: qty 1

## 2020-03-23 NOTE — Progress Notes (Signed)
Per Argentina Donovan RN per Dr. Grayland Ormond okay to proceed with Alimta treatment as scheduled with B/P 129/94 and HR 102.  1252: Pt tolertaed infusion well. No s/s of distress or reaction noted. Pt stable at discharge.

## 2020-03-25 ENCOUNTER — Encounter: Payer: Self-pay | Admitting: Radiation Oncology

## 2020-03-25 ENCOUNTER — Ambulatory Visit
Admission: RE | Admit: 2020-03-25 | Discharge: 2020-03-25 | Disposition: A | Discharge status: Home / Self Care | Payer: Medicare Other | Source: Ambulatory Visit | Attending: Radiation Oncology | Admitting: Radiation Oncology

## 2020-03-25 ENCOUNTER — Other Ambulatory Visit: Payer: Self-pay

## 2020-03-25 VITALS — BP 143/85 | HR 125 | Temp 100.7°F | Resp 18 | Wt 129.0 lb

## 2020-03-25 DIAGNOSIS — C342 Malignant neoplasm of middle lobe, bronchus or lung: Secondary | ICD-10-CM

## 2020-03-25 NOTE — Progress Notes (Signed)
Radiation Oncology Follow up Note  Name: Lauren Page   Date:   03/25/2020 MRN:  [id number] DOB: [birth year]    This 69 y.o. 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  She is seen today in routine follow-up.  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  She also has by PET/CT criteria.  Hypermetabolic left periaortic adenopathy as well as left supraclavicular adenopathy compatible with malignancy she also have a thick pleural rind on the right which is hypermetabolic suspicious for malignant effusion.  She has been started on pemetrexed as single agent palliation by medical oncology.  She has no specific areas that would require radiation at this time.  She also has some cough and dyspnea on exertion.  Patient does complain of some right flank pain this may be related to the malignant appearing pleural rind in the right chest.  COMPLICATIONS OF TREATMENT: none  FOLLOW UP COMPLIANCE: keeps appointments   PHYSICAL EXAM:  BP (!) 143/85   Pulse (!) 125   Temp (!) 100.7 F (38.2 C) (Tympanic)   Resp 18   Wt 129 lb (58.5 kg)   SpO2 99%   BMI 22.85 kg/m  Thin cachectic female in NAD.  Well-developed well-nourished patient in NAD. HEENT reveals PERLA, EOMI, discs not visualized.  Oral cavity is clear. No oral mucosal lesions are identified. Neck is clear without evidence of cervical or supraclavicular adenopathy. Lungs are clear to A&P. Cardiac examination is essentially unremarkable with regular rate and rhythm without murmur rub or thrill. Abdomen is benign with no organomegaly or masses noted. Motor sensory and DTR levels are equal and symmetric in the upper  and lower extremities. Cranial nerves II through XII are grossly intact. Proprioception is intact. No peripheral adenopathy or edema is identified. No motor or sensory levels are noted. Crude visual fields are within normal range.  RADIOLOGY RESULTS: PET CT scan reviewed compatible with above-stated findings  PLAN: Present time she is on palliative single agent chemotherapy under medical oncology's direction.  Do not see any real area for palliation at this time.  I would be happy to reevaluate her at any time for palliative radiation therapy.  I have asked to see her back in 6 months for follow-up.  Start with seizure anytime should palliative treatment be indicated.  I would like to take this opportunity to thank you for allowing me to participate in the care of your patient.Noreene Filbert, MD

## 2020-03-26 ENCOUNTER — Telehealth: Payer: Self-pay | Admitting: *Deleted

## 2020-03-26 NOTE — Telephone Encounter (Addendum)
Patient called reporting that yesterday her temp was over 100 and she just rechecked it and it is 101.9. Tylenol brings it down, but it goes back up. She had treatment Monday.She is asking what to do. Please advise

## 2020-03-26 NOTE — Telephone Encounter (Signed)
Per verbal order Dr Grayland Ormond, patient to take Tylenol tonight and come in for [facility] in AM and if temp keeps going up, she should go to ER tonight.  Call returned to patient and she absolutely refuses to come in tomorrow stating that her temp was 99 Monday before treatment, and that he was notified yesterday when it was over 100 and that he did not want to do anything about it then and now that it is 101.9 she wants a video visit and some antibiotics called in. She states she does not have transportation to get to office and that her husband also had chemotherapy Monday and in bathroom now vomiting. She states that there is no way she is going to ER mainly due to the wait time as well as transportation issue. I spoke with Lorretta Harp, NP and she has agreed to do a virtual visit with patient tomorrow morning and patient accepts appointment for 9 AM and gave me her email address [email or web address]

## 2020-03-27 ENCOUNTER — Telehealth: Payer: Self-pay | Admitting: *Deleted

## 2020-03-27 ENCOUNTER — Encounter: Payer: Self-pay | Admitting: Oncology

## 2020-03-27 ENCOUNTER — Inpatient Hospital Stay: Payer: Medicare Other | Admitting: Oncology

## 2020-03-27 DIAGNOSIS — J3489 Other specified disorders of nose and nasal sinuses: Secondary | ICD-10-CM

## 2020-03-27 DIAGNOSIS — R059 Cough, unspecified: Secondary | ICD-10-CM

## 2020-03-27 DIAGNOSIS — C3490 Malignant neoplasm of unspecified part of unspecified bronchus or lung: Secondary | ICD-10-CM

## 2020-03-27 DIAGNOSIS — Z20822 Contact with and (suspected) exposure to covid-19: Secondary | ICD-10-CM

## 2020-03-27 MED ORDER — PREDNISONE 10 MG (21) PO TBPK: ORAL_TABLET | ORAL | 0 refills | Status: DC

## 2020-03-27 MED ORDER — LEVOFLOXACIN 500 MG PO TABS: 500.0000 mg | ORAL_TABLET | Freq: Every day | ORAL | 0 refills | Status: DC

## 2020-03-27 NOTE — Telephone Encounter (Signed)
Manuella Ghazi, NP 03/27/2020 1:04 PM

## 2020-03-27 NOTE — Progress Notes (Signed)
Virtual Visit via Video Note  I connected with Lauren Page on 03/24/20 at  9:00 AM EDT by a video enabled telemedicine application and verified that I am speaking with the correct person using two identifiers.  Location: Patient: Home Provider: Clinic    I discussed the limitations of evaluation and management by telemedicine and the availability of in person appointments. The patient expressed understanding and agreed to proceed.  History of Present Illness: Lauren Page is a 69 year old female with past medical history significant for mitral valve prolapse, hypertension, asthma, GERD, hyperlipidemia, vitamin D deficiency and stage IV adenocarcinoma of the lung.  She is followed by Dr. Grayland Ormond and presents today for Covid-like symptoms.  She is currently undergoing chemotherapy with Alimta last given on 03/23/2020.  Labs were stable at that time.  Symptom onset was 03/24/2020.   Observations/Objective: Review of Systems  Constitutional: Positive for fever and malaise/fatigue.  HENT: Positive for sinus pain.   Respiratory: Positive for cough and shortness of breath.    Physical Exam Constitutional:      Appearance: Normal appearance.  Pulmonary:     Effort: Pulmonary effort is normal.  Neurological:     Mental Status: She is alert.    Assessment and Plan:  Stage IV lung cancer: -Status post cycle 1 Alimta given on 03/23/2020. -Labs were stable at that time.  Covid-like symptoms: -Cough, fever, congestion, sinus pain, malaise and shortness of breath. -Has been vaccinated x2 -Given her symptoms would recommend Covid testing. -If positive, will refer for monoclonal antibody infusion. -Given she is immune compromised, will start antibiotic and steroids.  Prescription sent for Levaquin 500 mg daily x7 days and prednisone taper.   Red flag symptoms discussed. She will call back after her Covid testing.  Follow Up Instructions: -RTC as scheduled unless Covid positive.   I  discussed the assessment and treatment plan with the patient. The patient was provided an opportunity to ask questions and all were answered. The patient agreed with the plan and demonstrated an understanding of the instructions.   The patient was advised to call back or seek an in-person evaluation if the symptoms worsen or if the condition fails to improve as anticipated.  I provided 15 minutes of non-face-to-face time during this encounter.  Greater than 50% was spent in counseling and coordination of care with this patient including but not limited to discussion of the relevant topics above (See A&P) including, but not limited to diagnosis and management of acute and chronic medical conditions.   Jacquelin Hawking, NP

## 2020-03-27 NOTE — Telephone Encounter (Signed)
Patient called to say that she was in a line for COVID test that was 3 blocks long and she got tired of waiting. She wil try to find an over the counter test and wanted to let you know that she tried

## 2020-03-27 NOTE — Progress Notes (Signed)
[place]  Telephone:([phone] Fax:(336) [phone]  ID: TERISSA HAFFEY OB: 06/15/1951  MR#: [id number]  SAY#:[id number]  Patient Care Team: [name], MD as PCP - General (Internal Medicine) Lloyd Huger, MD as Consulting Physician (Oncology)  I connected with [name] on 03/31/20 at  3:30 PM EDT by video enabled telemedicine visit and verified that I am speaking with the correct person using two identifiers.   I discussed the limitations, risks, security and privacy concerns of performing an evaluation and management service by telemedicine and the availability of in-person appointments. I also discussed with the patient that there may be a patient responsible charge related to this service. The patient expressed understanding and agreed to proceed.   Other persons participating in the visit and their role in the encounter: Patient, MD.  Patient's location: Home. Provider's location: Clinic.  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  INTERVAL HISTORY: Patient agreed to video assisted telemedicine visit for further evaluation and to assess her toleration of single agent pemetrexed.  Although she tolerated her treatment well, patient noted to have increasing fevers and cough this past week and was found to be Covid positive.  She received monoclonal antibodies yesterday.  She continues to have chronic weakness and fatigue.  She had no neurologic complaints. She has a good appetite and denies weight loss.  She denies any chest pain, shortness of breath, or hemoptysis.  She denies any vomiting, constipation, or diarrhea.  She has no urinary complaints.  Patient offers no further specific complaints today.  REVIEW OF SYSTEMS:   Review of Systems  Constitutional: Positive for fever and malaise/fatigue. Negative for weight loss.  Respiratory: Positive for cough. Negative for hemoptysis and shortness of  breath.   Cardiovascular: Negative.  Negative for chest pain and leg swelling.  Gastrointestinal: Negative.  Negative for abdominal pain, constipation, diarrhea and nausea.  Genitourinary: Negative.  Negative for dysuria and flank pain.  Musculoskeletal: Negative.  Negative for back pain and joint pain.  Skin: Negative.  Negative for rash.  Neurological: Positive for weakness. Negative for sensory change, focal weakness and headaches.  Psychiatric/Behavioral: The patient is nervous/anxious.     As per HPI. Otherwise, a complete review of systems is negative.  PAST MEDICAL HISTORY: Past Medical History:  Diagnosis Date  . Abnormal echocardiogram   . Acid reflux   . Allergic rhinitis   . Anemia    HAD TO HAVE IRON INFUSIONS BACK IN 2015  . Arthritis    bil hands and back  . Asthma   . Bloody discharge from left nipple   . Cancer of right lung ([place]) 2015   lung cancer - chemo, Dr Genevive Bi removed RML Lobectomy.   . Colon polyps   . COPD (chronic obstructive pulmonary disease) ([place])   . Dyspnea    WITH EXERTION  . Family history of breast cancer   . Family history of cervical cancer   . Family history of colon cancer   . Family history of skin cancer   . Hypertension   . Malignant neoplasm of middle lobe of right lung ([place])   . Mitral valve prolapse   . Mitral valve prolapse   . Non-small cell lung cancer ([place])   . Personal history of chemotherapy    F/U Lung Cancer  . Personal history of radiation therapy    Lung cancer  . Primary cancer of right middle lobe of lung ([place]) 03/11/2016  .  Squamous cell carcinoma of skin 07/07/2009   right supraclavicular/EDC  . Stage IV adenocarcinoma of lung, unspecified laterality ([place])     PAST SURGICAL HISTORY: Past Surgical History:  Procedure Laterality Date  . ABDOMINAL HYSTERECTOMY    . BAND HEMORRHOIDECTOMY    . BREAST BIOPSY Left 03/01/2019   Procedure: LEFT BREAST EXCISIONAL BIOPSY WITH NEEDLE LOCALIZATION;  Surgeon: Herbert Pun, MD;  Location: [facility] ORS;  Service: General;  Laterality: Left;  . BREAST EXCISIONAL BIOPSY Left 1994 (-), 2004 (-)   benign  . BREAST EXCISIONAL BIOPSY Left 03/01/2019   - INTRADUCTAL PAPILLOMA WITH SCLEROSIS.   Marland Kitchen COLONOSCOPY  2008    Dr. Donnella Sham  . COLONOSCOPY WITH PROPOFOL N/A 04/12/2019   Procedure: COLONOSCOPY WITH PROPOFOL;  Surgeon: Lollie Sails, MD;  Location: [facility] ENDOSCOPY;  Service: Endoscopy;  Laterality: N/A;  . IR RADIOLOGIST EVAL & MGMT  08/28/2019  . IR RADIOLOGIST EVAL & MGMT  10/30/2019  . IR RADIOLOGIST EVAL & MGMT  11/26/2019  . KNEE SURGERY  2007  . LUNG LOBECTOMY Right    middle lobe  . PORTACATH PLACEMENT Right 02/09/2017   Procedure: INSERTION PORT-A-CATH;  Surgeon: Nestor Lewandowsky, MD;  Location: [facility] ORS;  Service: General;  Laterality: Right;  . RADIOLOGY WITH ANESTHESIA N/A 10/02/2019   Procedure: MICROWAVE THERMA ABLATION;  Surgeon: Aletta Edouard, MD;  Location: [facility] ORS;  Service: Radiology;  Laterality: N/A;  . SALIVARY GLAND SURGERY Left 1983   with lymph node removal also  . SALIVARY GLAND SURGERY     salivary gland removal   . UPPER GI ENDOSCOPY  2008  . VIDEO BRONCHOSCOPY WITH ENDOBRONCHIAL ULTRASOUND Right 01/24/2017   Procedure: VIDEO BRONCHOSCOPY WITH ENDOBRONCHIAL ULTRASOUND;  Surgeon: [name], MD;  Location: [facility] ORS;  Service: Pulmonary;  Laterality: Right;    FAMILY HISTORY: Family History  Problem Relation Age of Onset  . Breast cancer Maternal Aunt 60  . Alzheimer's disease Maternal Aunt   . Breast cancer Paternal Aunt        dx. in her late 72s  . Breast cancer Maternal Grandmother        dx. in her 41s  . Cervical cancer Maternal Grandmother 60  . Colon cancer Maternal Grandmother        dx. in her 108s  . Breast cancer Maternal Aunt 60  . Alzheimer's disease Maternal Aunt   . Breast cancer Paternal Aunt        dx. in her late 73s  . Heart disease Paternal Aunt   . Healthy Mother   . Healthy Father   . Colon  cancer Father 54  . Skin cancer Father   . Heart attack Father   . Heart attack Paternal Grandfather   . Skin cancer Paternal Aunt   . Heart attack Paternal Uncle 26  . Heart attack Paternal Uncle     ADVANCED DIRECTIVES (Y/N):  N  HEALTH MAINTENANCE: Social History   Tobacco Use  . Smoking status: Never Smoker  . Smokeless tobacco: Never Used  Vaping Use  . Vaping Use: Never used  Substance Use Topics  . Alcohol use: Yes    Alcohol/week: 7.0 standard drinks    Types: 7 Glasses of wine per week  . Drug use: No     Colonoscopy:  PAP:  Bone density:  Lipid panel:  Allergies  Allergen Reactions  . Other Other (See Comments)    Cat gut sutures get infected Cat gut sutures get infected  . Penicillins Swelling  and Rash    Did it involve swelling of the face/tongue/throat, SOB, or low BP? Unknown Did it involve sudden or severe rash/hives, skin peeling, or any reaction on the inside of your mouth or nose? No Did you need to seek medical attention at a hospital or doctor's office? Yes When did it last happen?30 + years  If all above answers are "NO", may proceed with cephalosporin use.    . Sulfa Antibiotics Nausea And Vomiting    Current Outpatient Medications  Medication Sig Dispense Refill  . albuterol (PROVENTIL HFA;VENTOLIN HFA) 108 (90 Base) MCG/ACT inhaler Inhale 1-2 puffs into the lungs every 6 (six) hours as needed for wheezing or shortness of breath.    Marland Kitchen ascorbic acid (VITAMIN C) 100 MG tablet Take 100 mg by mouth daily.     . B Complex Vitamins (VITAMIN B COMPLEX PO) Take 1 tablet by mouth daily.     . Biotin w/ Vitamins C & E (HAIR/SKIN/NAILS PO) Take 1 tablet by mouth daily.    . Calcium Carb-Cholecalciferol (CALCIUM 600+D) 600-800 MG-UNIT TABS Take 2 tablets by mouth daily.    . Cholecalciferol (D3 MAXIMUM STRENGTH) 5000 units capsule Take 5,000 Units by mouth daily.    . clobetasol ointment (TEMOVATE) 8.13 % Apply 1 application topically 2 (two)  times daily. Dry area and apply to mouth sores twice daily as needed for up to 2 weeks. 15 g 0  . COLLAGEN PO Take 1,000 mg by mouth daily.    . cyclobenzaprine (FLEXERIL) 5 MG tablet Take 1 tablet (5 mg total) by mouth 3 (three) times daily as needed for muscle spasms. 30 tablet 0  . diclofenac Sodium (VOLTAREN) 1 % GEL Apply 2 g topically 4 (four) times daily as needed (pain).     . fexofenadine (ALLEGRA) 180 MG tablet Take 180 mg by mouth daily as needed for allergies.     . fluticasone (FLONASE) 50 MCG/ACT nasal spray Place 2 sprays into both nostrils daily as needed for allergies or rhinitis.    . Fluticasone-Salmeterol (ADVAIR) 250-50 MCG/DOSE AEPB Inhale 1 puff into the lungs 2 (two) times daily as needed (for respiratory issues.).    Marland Kitchen folic acid (FOLVITE) 1 MG tablet Take 1 tablet (1 mg total) by mouth daily. Start 5-7 days before Alimta chemotherapy. Continue until 21 days after Alimta completed. 100 tablet 3  . lansoprazole (PREVACID) 30 MG capsule Take 30 mg by mouth daily.     Marland Kitchen levofloxacin (LEVAQUIN) 500 MG tablet Take 1 tablet (500 mg total) by mouth daily. 7 tablet 0  . lidocaine-prilocaine (EMLA) cream Apply 1 application topically as needed. Apply generously over the Mediport 45 minutes prior to chemotherapy. 30 g 0  . lidocaine-prilocaine (EMLA) cream Apply to affected area once 30 g 3  . losartan (COZAAR) 25 MG tablet Take 25 mg by mouth at bedtime.     . magic mouthwash SOLN Take 5-10 mLs by mouth 4 (four) times daily as needed for mouth pain. 480 mL 0  . ondansetron (ZOFRAN) 8 MG tablet Take 1 tablet (8 mg total) by mouth every 8 (eight) hours as needed for nausea or vomiting (start 3 days; after chemo). 40 tablet 1  . ondansetron (ZOFRAN) 8 MG tablet Take 1 tablet (8 mg total) by mouth 2 (two) times daily as needed (Nausea or vomiting). 60 tablet 1  . osimertinib mesylate (TAGRISSO) 80 MG tablet Take 1 tablet (80 mg total) by mouth daily. 30 tablet 5  . polyethylene  glycol  (MIRALAX / GLYCOLAX) packet Take 17 g by mouth daily as needed for mild constipation.    . predniSONE (STERAPRED UNI-PAK 21 TAB) 10 MG (21) TBPK tablet Take as directed. 21 tablet 0  . PREMARIN 1.25 MG tablet Take 1.25 mg by mouth daily.   1  . prochlorperazine (COMPAZINE) 10 MG tablet TAKE 1 TABLET (10 MG TOTAL) BY MOUTH EVERY 6 (SIX) HOURS AS NEEDED FOR NAUSEA OR VOMITING. 40 tablet 1  . prochlorperazine (COMPAZINE) 10 MG tablet Take 1 tablet (10 mg total) by mouth every 6 (six) hours as needed (Nausea or vomiting). 60 tablet 1  . triamcinolone (KENALOG) 0.1 % paste Use as directed 1 application in the mouth or throat 2 (two) times daily. 15 g 12  . zinc gluconate 50 MG tablet Take 50 mg by mouth daily.     No current facility-administered medications for this visit.   Facility-Administered Medications Ordered in Other Visits  Medication Dose Route Frequency Provider Last Rate Last Admin  . heparin lock flush 100 unit/mL  500 Units Intravenous Once Lloyd Huger, MD      . sodium chloride flush (NS) 0.9 % injection 10 mL  10 mL Intravenous PRN Lloyd Huger, MD      . sodium chloride flush (NS) 0.9 % injection 10 mL  10 mL Intravenous PRN Lloyd Huger, MD   10 mL at 01/22/18 1419    OBJECTIVE: There were no vitals filed for this visit.   There is no height or weight on file to calculate BMI.    ECOG FS:1 - Symptomatic but completely ambulatory  General: Well-developed, well-nourished, no acute distress. HEENT: Normocephalic. Neuro: Alert, answering all questions appropriately. Cranial nerves grossly intact. Psych: Normal affect.  LAB RESULTS:  Lab Results  Component Value Date   NA 136 03/23/2020   K 4.0 03/23/2020   CL 103 03/23/2020   CO2 24 03/23/2020   GLUCOSE 101 (H) 03/23/2020   BUN 15 03/23/2020   CREATININE 0.75 03/23/2020   CALCIUM 8.6 (L) 03/23/2020   PROT 7.2 03/23/2020   ALBUMIN 3.1 (L) 03/23/2020   AST 23 03/23/2020   ALT 18 03/23/2020    ALKPHOS 89 03/23/2020   BILITOT 0.5 03/23/2020   GFRNONAA >60 03/23/2020   GFRAA >60 03/23/2020    Lab Results  Component Value Date   WBC 6.6 03/23/2020   NEUTROABS 5.0 03/23/2020   HGB 10.0 (L) 03/23/2020   HCT 29.2 (L) 03/23/2020   MCV 79.3 (L) 03/23/2020   PLT 274 03/23/2020     STUDIES: NM PET Image Restage (PS) Skull Base to Thigh  Result Date: 03/11/2020 CLINICAL DATA:  Subsequent treatment strategy for non-small cell lung cancer. EXAM: NUCLEAR MEDICINE PET SKULL BASE TO THIGH TECHNIQUE: 7.0 mCi F-18 FDG was injected intravenously. Full-ring PET imaging was performed from the skull base to thigh after the radiotracer. CT data was obtained and used for attenuation correction and anatomic localization. Fasting blood glucose: 91 mg/dl COMPARISON:  Multiple exams, including CT scan 02/17/2020 and PET-CT from 07/09/2019 FINDINGS: Mediastinal blood pool activity: SUV max 2.5 Liver activity: SUV max N/A NECK: No significant abnormal hypermetabolic activity in this region. Incidental CT findings: none CHEST: Moderate pleural effusion mainly loculated along the right lung base, with hypermetabolic pleural thickening on the right such that malignant pleural effusion is a distinct possibility. One representative right posterolateral pleural segment has a maximum SUV of 4.6. Right perihilar activity which could be an collapse along or  along the pleural margin of the hila has maximum SUV of 5.7, previously 3.8. Newly hypermetabolic left supraclavicular lymph node measuring 0.8 cm in short axis on image 54/3 (previously 0.5 cm) has maximum SUV of 4.8 (previously not hypermetabolic). A region of consolidation along the pleural margin adjacent to the right heart border along the right middle lobe has maximum SUV of 7.4, possibly from radiation pneumonitis although malignant involvement is not excluded. Incidental CT findings: Atherosclerotic thoracic aorta. Ectatic ascending thoracic aorta at 3.8 cm in  diameter on image 92/3. Mild cardiomegaly. Right perihilar consolidation, some of which may be from radiation pneumonitis. ABDOMEN/PELVIS: Hypermetabolic left periaortic adenopathy including a 1.3 cm lymph node and a 1.2 cm adjacent lymph node on images 153 in 154 of series 3. Conglomerate maximum SUV 11.9, compatible with malignancy. Left mid kidney ablation site posterolaterally. Incidental CT findings: Mild sigmoid colon diverticulosis. SKELETON: No significant abnormal hypermetabolic activity in this region. Incidental CT findings: Cervical, thoracic, and lumbar spondylosis. IMPRESSION: 1. Newly hypermetabolic left periaortic adenopathy compatible with malignancy. 2. New hypermetabolic left supraclavicular adenopathy compatible with malignancy. 3. Thick pleural rind on the right is hypermetabolic and suspicious for malignant effusion. There are regions of accentuated density in the right paramediastinal and right perihilar consolidation which could be from radiation therapy or malignancy. 4. Other imaging findings of potential clinical significance: Aortic Atherosclerosis (ICD10-I70.0). Mild cardiomegaly. Mild sigmoid colon diverticulosis. Spondylosis. Electronically Signed   By: Van Clines M.D.   On: 03/11/2020 15:54   US BREAST LTD UNI RIGHT INC AXILLA  Result Date: 03/03/2020 CLINICAL DATA:  Recall from screening mammography with tomosynthesis, possible asymmetry in the LOWER subareolar RIGHT breast visible only on the MLO images. Current history of lung cancer metastatic to the kidney. EXAM: DIGITAL DIAGNOSTIC RIGHT MAMMOGRAM WITH CAD AND TOMO ULTRASOUND RIGHT BREAST COMPARISON:  Previous exam(s). ACR Breast Density Category c: The breast tissue is heterogeneously dense, which may obscure small masses. FINDINGS: Tomosynthesis and synthesized spot-compression MLO view of the area of concern in the RIGHT breast and a tomosynthesis and synthesized full field mediolateral view of the RIGHT breast  were obtained. The asymmetry in the lower subareolar location questioned on screening mammography disperses with compression, indicating overlapping normal dense fibroglandular tissue. There is no visible underlying mass or architectural distortion. Dense fibroglandular tissue is present in this location. Note is made of a circumscribed isodense mass measuring approximately 8-9 mm which localizes to the [place] on the screening CC images and which has been stable over multiple prior mammograms, indicating benignity. The full field mediolateral image was processed with CAD. Targeted RIGHT breast ultrasound is performed, showing normal dense fibroglandular tissue in the lower subareolar location with imaging from 4 o'clock through 6 o'clock to 8 o'clock. The circumscribed oval parallel nearly anechoic mass with scattered internal echoes at the 8 o'clock position approximately 4 cm from the nipple measuring approximately 8 x 4 x 7 mm corresponds to the mammographically stable mass. No suspicious solid mass or abnormal acoustic shadowing is identified. IMPRESSION: No mammographic or sonographic evidence of malignancy involving the RIGHT breast. RECOMMENDATION: Screening mammogram in one year.(Code:SM-B-01Y) I have discussed the findings and recommendations with the patient. If applicable, a reminder letter will be sent to the patient regarding the next appointment. BI-RADS CATEGORY  2: Benign. Electronically Signed   By: Evangeline Dakin M.D.   On: 03/03/2020 11:56   MM DIAG BREAST TOMO UNI RIGHT  Result Date: 03/03/2020 CLINICAL DATA:  Recall from screening mammography  with tomosynthesis, possible asymmetry in the LOWER subareolar RIGHT breast visible only on the MLO images. Current history of lung cancer metastatic to the kidney. EXAM: DIGITAL DIAGNOSTIC RIGHT MAMMOGRAM WITH CAD AND TOMO ULTRASOUND RIGHT BREAST COMPARISON:  Previous exam(s). ACR Breast Density Category c: The breast tissue is  heterogeneously dense, which may obscure small masses. FINDINGS: Tomosynthesis and synthesized spot-compression MLO view of the area of concern in the RIGHT breast and a tomosynthesis and synthesized full field mediolateral view of the RIGHT breast were obtained. The asymmetry in the lower subareolar location questioned on screening mammography disperses with compression, indicating overlapping normal dense fibroglandular tissue. There is no visible underlying mass or architectural distortion. Dense fibroglandular tissue is present in this location. Note is made of a circumscribed isodense mass measuring approximately 8-9 mm which localizes to the [place] on the screening CC images and which has been stable over multiple prior mammograms, indicating benignity. The full field mediolateral image was processed with CAD. Targeted RIGHT breast ultrasound is performed, showing normal dense fibroglandular tissue in the lower subareolar location with imaging from 4 o'clock through 6 o'clock to 8 o'clock. The circumscribed oval parallel nearly anechoic mass with scattered internal echoes at the 8 o'clock position approximately 4 cm from the nipple measuring approximately 8 x 4 x 7 mm corresponds to the mammographically stable mass. No suspicious solid mass or abnormal acoustic shadowing is identified. IMPRESSION: No mammographic or sonographic evidence of malignancy involving the RIGHT breast. RECOMMENDATION: Screening mammogram in one year.(Code:[id number]) I have discussed the findings and recommendations with the patient. If applicable, a reminder letter will be sent to the patient regarding the next appointment. BI-RADS CATEGORY  2: Benign. Electronically Signed   By: Evangeline Dakin M.D.   On: 03/03/2020 11:56    ONCOLOGY HISTORY: Patient initially underwent resection in May 2015 and was noted to have the EGFR mutation.  She initially received 4 cycles of adjuvant cisplatin and Alimta completing on January 23, 2014.  Patient was on the [facility] protocol for maintenance Tarceva versus placebo, but discontinued treatment in November 2015 because of significant side effects.  Although blinded, presumption was she was receiving Tarceva.  She recently was noted to have a recurrence confirmed by right paratracheal lymph node biopsy on January 24, 2017.  PET scan on February 03, 2017 did not reveal any distant metastasis.  She underwent treatment with weekly carboplatinum and Taxol along with daily XRT finishing in October 2018.  She initiated maintenance durvalumab on June 29, 2017.  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  Patient received Tagrisso from April 2019 through September 2021.  She then initiated single agent pemetrexed on March 23, 2020.    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  PLAN:   1. 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  PET scan results from July 09, 2019 reviewed independently which revealed mild progression of disease in her lungs with an isolated left kidney metastasis that has undergone ablation.  Her most recent PET scan on March 11, 2020 reviewed independently revealed continued progression of disease in her lungs.  Discontinue Tagrisso and initiate single agent pemetrexed.  If there is not an adequate response, can consider adding in cisplatin or carboplatin.  Will also send recent pathology results for Omnisec testing to assess whether any bypass mutations are evident.  Patient tolerated  cycle 1 well although recovery was complicated by Covid infection.  Patient will require to remain quarantined for 14 days, but she will likely be able to receive her second infusion in 2 weeks.  Return to clinic as previously scheduled for consideration of cycle 2.  2.  Diarrhea: Patient does not complain of this today.   Continue Lomotil as prescribed. 3.  Cough:  Chronic and unchanged, monitor. 4.  Breast lesion: Benign papilloma.  Patient underwent lumpectomy on March 01, 2019.  Her most recent mammogram on March 03, 2020 was reported as BI-RADS 2.   5.  Right pleural effusion: Malignant.  Patient had repeat thoracentesis in May 2021.  Pemetrexed as above. 6.  Nausea: Continue current antiemetics as needed. 7.  Anemia: Patient's most recent hemoglobin was 10.0.  Monitor. 8.  Covid positivity: Patient received monoclonal antibody infusion yesterday.  Continue symptomatic treatment and quarantine for 14 days.  If patient becomes symptomatic, she can keep her previously scheduled appointment in 2 weeks for her next infusion.  I provided 20 minutes of face-to-face video visit time during this encounter which included chart review, counseling, and coordination of care as documented above.   Patient expressed understanding and was in agreement with this plan. She also understands that She can call clinic at any time with any questions, concerns, or complaints.    Lloyd Huger, MD   03/31/2020 4:36 PM

## 2020-03-28 ENCOUNTER — Telehealth: Payer: Self-pay | Admitting: Oncology

## 2020-03-28 NOTE — Telephone Encounter (Signed)
Patient called to report being tested positive for Covid.  She denies any significant more shortness of breath than her baseline, she had a fever a few days ago, no fever today.  She was seen by Faythe Casa yesterday.  She has questionable monoclonal antibody infusion.  I will send this message to Anderson Malta who can help to arrange monoclonal antibody infusion for her. Recommend patient to monitor temperature and oxygen saturation. She has blood work on Monday morning 03/30/2020.

## 2020-03-29 ENCOUNTER — Other Ambulatory Visit: Payer: Self-pay | Admitting: Oncology

## 2020-03-29 ENCOUNTER — Ambulatory Visit (HOSPITAL_COMMUNITY)
Admission: RE | Admit: 2020-03-29 | Discharge: 2020-03-29 | Disposition: A | Discharge status: Home / Self Care | Payer: Medicare Other | Source: Ambulatory Visit | Attending: Pulmonary Disease | Admitting: Pulmonary Disease

## 2020-03-29 ENCOUNTER — Encounter: Payer: Self-pay | Admitting: Oncology

## 2020-03-29 DIAGNOSIS — Z23 Encounter for immunization: Secondary | ICD-10-CM | POA: Diagnosis not present

## 2020-03-29 DIAGNOSIS — U071 COVID-19: Secondary | ICD-10-CM | POA: Diagnosis present

## 2020-03-29 MED ORDER — METHYLPREDNISOLONE SODIUM SUCC 125 MG IJ SOLR: 125.0000 mg | Freq: Once | INTRAMUSCULAR | Status: DC | PRN

## 2020-03-29 MED ORDER — ALBUTEROL SULFATE HFA 108 (90 BASE) MCG/ACT IN AERS: 2.0000 | INHALATION_SPRAY | Freq: Once | RESPIRATORY_TRACT | Status: DC | PRN

## 2020-03-29 MED ORDER — FAMOTIDINE IN NACL 20-0.9 MG/50ML-% IV SOLN: 20.0000 mg | Freq: Once | INTRAVENOUS | Status: DC | PRN

## 2020-03-29 MED ORDER — SODIUM CHLORIDE 0.9 % IV SOLN: INTRAVENOUS | Status: DC | PRN

## 2020-03-29 MED ORDER — SODIUM CHLORIDE 0.9 % IV SOLN
1200.0000 mg | Freq: Once | INTRAVENOUS | Status: AC
  Administered 2020-03-29: 1200 mg via INTRAVENOUS

## 2020-03-29 MED ORDER — ACETAMINOPHEN 325 MG PO TABS
650.0000 mg | ORAL_TABLET | Freq: Four times a day (QID) | ORAL | Status: DC | PRN
  Administered 2020-03-29: 650 mg via ORAL

## 2020-03-29 MED ORDER — DIPHENHYDRAMINE HCL 50 MG/ML IJ SOLN: 50.0000 mg | Freq: Once | INTRAMUSCULAR | Status: DC | PRN

## 2020-03-29 MED ORDER — EPINEPHRINE 0.3 MG/0.3ML IJ SOAJ: 0.3000 mg | Freq: Once | INTRAMUSCULAR | Status: DC | PRN

## 2020-03-29 NOTE — Progress Notes (Signed)
  Diagnosis: COVID-19  Physician: Dr. Joya Gaskins   Procedure: Covid Infusion Clinic Med: casirivimab\imdevimab infusion - Provided patient with casirivimab\imdevimab fact sheet for patients, parents and caregivers prior to infusion.  Complications: No immediate complications noted.  Discharge: Discharged home   Claudia Desanctis 03/29/2020

## 2020-03-29 NOTE — Discharge Instructions (Signed)

## 2020-03-29 NOTE — Progress Notes (Signed)
I connected by phone with Mrs. Climer to discuss the potential use of an new treatment for mild to moderate COVID-19 viral infection in non-hospitalized patients.   This patient is a age/sex that meets the FDA criteria for Emergency Use Authorization of casirivimab\imdevimab.  Has a (+) direct SARS-CoV-2 viral test result 1. Has mild or moderate COVID-19  2. Is ? 70 years of age and weighs ? 40 kg 3. Is NOT hospitalized due to COVID-19 4. Is NOT requiring oxygen therapy or requiring an increase in baseline oxygen flow rate due to COVID-19 5. Is within 10 days of symptom onset 6. Has at least one of the high risk factor(s) for progression to severe COVID-19 and/or hospitalization as defined in EUA. Specific high risk criteria : Past Medical History:  Diagnosis Date  . Abnormal echocardiogram   . Acid reflux   . Allergic rhinitis   . Anemia    HAD TO HAVE IRON INFUSIONS BACK IN 2015  . Arthritis    bil hands and back  . Asthma   . Bloody discharge from left nipple   . Cancer of right lung ([place]) 2015   lung cancer - chemo, Dr Genevive Bi removed RML Lobectomy.   . Colon polyps   . COPD (chronic obstructive pulmonary disease) ([place])   . Dyspnea    WITH EXERTION  . Family history of breast cancer   . Family history of cervical cancer   . Family history of colon cancer   . Family history of skin cancer   . Hypertension   . Malignant neoplasm of middle lobe of right lung ([place])   . Mitral valve prolapse   . Mitral valve prolapse   . Non-small cell lung cancer ([place])   . Personal history of chemotherapy    F/U Lung Cancer  . Personal history of radiation therapy    Lung cancer  . Primary cancer of right middle lobe of lung ([place]) 03/11/2016  . Squamous cell carcinoma of skin 07/07/2009   right supraclavicular/EDC  . Stage IV adenocarcinoma of lung, unspecified laterality ([place])   ?  ?    Symptom onset  03/24/20   I have spoken and communicated the following to the patient or  parent/caregiver:   1. FDA has authorized the emergency use of casirivimab\imdevimab for the treatment of mild to moderate COVID-19 in adults and pediatric patients with positive results of direct SARS-CoV-2 viral testing who are 53 years of age and older weighing at least 40 kg, and who are at high risk for progressing to severe COVID-19 and/or hospitalization.   2. The significant known and potential risks and benefits of casirivimab\imdevimab, and the extent to which such potential risks and benefits are unknown.   3. Information on available alternative treatments and the risks and benefits of those alternatives, including clinical trials.   4. Patients treated with casirivimab\imdevimab should continue to self-isolate and use infection control measures (e.g., wear mask, isolate, social distance, avoid sharing personal items, clean and disinfect "high touch" surfaces, and frequent handwashing) according to CDC guidelines.    5. The patient or parent/caregiver has the option to accept or refuse casirivimab\imdevimab .   After reviewing this information with the patient, The patient agreed to proceed with receiving casirivimab\imdevimab infusion and will be provided a copy of the Fact sheet prior to receiving the infusion.Rulon Abide, AGNP-C [phone] ([place])

## 2020-03-29 NOTE — Telephone Encounter (Signed)
Called and spoke to patient. Her and her husband will both be getting MAB this afternoon.   Faythe Casa, NP 03/29/2020 10:17 AM

## 2020-03-30 ENCOUNTER — Telehealth: Payer: Self-pay | Admitting: *Deleted

## 2020-03-30 ENCOUNTER — Encounter: Payer: Self-pay | Admitting: Oncology

## 2020-03-30 ENCOUNTER — Inpatient Hospital Stay: Payer: Medicare Other

## 2020-03-30 ENCOUNTER — Inpatient Hospital Stay (HOSPITAL_BASED_OUTPATIENT_CLINIC_OR_DEPARTMENT_OTHER): Payer: Medicare Other | Admitting: Oncology

## 2020-03-30 DIAGNOSIS — C3490 Malignant neoplasm of unspecified part of unspecified bronchus or lung: Secondary | ICD-10-CM

## 2020-03-30 NOTE — Telephone Encounter (Signed)
Thank you both. We will need to delay her next treatment.

## 2020-03-30 NOTE — Progress Notes (Signed)
Patient states she has no questions or concerns today for provider. States she recently contracted COVID and she just had an infusion treatment for covid. She states infusion treatment has helped tremendously.

## 2020-03-30 NOTE — Telephone Encounter (Signed)
pt called and stated that she tested Positive for Covid19 Her sched 03/30/20 Mychart visit is still sched  for today.

## 2020-03-31 ENCOUNTER — Ambulatory Visit: Payer: Medicare Other | Admitting: Internal Medicine

## 2020-04-01 ENCOUNTER — Telehealth: Payer: Self-pay | Admitting: Oncology

## 2020-04-01 NOTE — Telephone Encounter (Signed)
Patient phoned on this date and stated that she did not need port flush on 04-06-20 due to restarting chemo on 04-14-20. 04-06-20 port flush cancelled per patient's request.

## 2020-04-06 ENCOUNTER — Encounter: Payer: Self-pay | Admitting: Oncology

## 2020-04-06 ENCOUNTER — Inpatient Hospital Stay: Payer: Medicare Other

## 2020-04-07 ENCOUNTER — Encounter: Payer: Self-pay | Admitting: Oncology

## 2020-04-08 ENCOUNTER — Other Ambulatory Visit: Payer: Self-pay | Admitting: Oncology

## 2020-04-08 ENCOUNTER — Other Ambulatory Visit: Payer: Self-pay | Admitting: *Deleted

## 2020-04-08 ENCOUNTER — Telehealth: Payer: Self-pay | Admitting: *Deleted

## 2020-04-08 MED ORDER — TRAMADOL HCL 50 MG PO TABS
50.0000 mg | ORAL_TABLET | Freq: Four times a day (QID) | ORAL | 0 refills | Status: DC | PRN
Start: 2020-04-08 — End: 2020-04-28

## 2020-04-08 NOTE — Telephone Encounter (Signed)
Call returned to patient, advised of prescription sent and offered virtual [facility] appointment. She declined virtual appointment stating she will discuss at her appointment Tuesday with Dr Grayland Ormond. I advised that if she does not get relief with Tramadol to call back and we will decide what to do from there. She was in agreement with this

## 2020-04-08 NOTE — Telephone Encounter (Signed)
Patient called reporting that the pain she has had since late July has gotten worse and she feels she needs something for pain. She states that she has mentioned this pain at her last appointment at the clinic with Dr Grayland Ormond and nothing was done about it. She reports that the pain is at the bottom of her right ribs from front all around to and including her back lower rib cage. She states the only time she does not have pain is lying down, but any time she sits or stands, she has sharp pain. She does report that she had COVID, but the pain predates this. She has a cough which has not changed and she also reports that she has shortness of breath when she walks a longer distance like to the mailbox, but none walking around the inside the house. She states that she has tried over the counter ibuprofen and tylenol as well as some expired Demerol that she had left over and it has not helped. She also has tried Tramadol that was prescribed for her husband and he had left over and it seems to have helped somewhat. She states that she cannot take Hydrocodone and would like to have some Tramadol or if needed, come in to be seen for this. Please advise

## 2020-04-08 NOTE — Telephone Encounter (Signed)
Ok to send Tramadol per Dr. Grayland Ormond, order sent for approval. Patient cannot be seen in clinic at this time due to date of positive covid test but if she wants to be seen can set her up for virtual visit with [facility].

## 2020-04-09 NOTE — Progress Notes (Signed)
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  INTERVAL HISTORY: Patient returns to clinic today for further evaluation and reinitiation of pemetrexed.  She has been on home quarantine for having a positive Covid test.  She no longer is having fevers and does not complain of cough today.  She continues to have right flank pain that is well controlled with tramadol.  She otherwise feels well.  She has no neurologic complaints. She has a good appetite and denies weight loss.  She denies any chest pain, shortness of breath, or hemoptysis.  She denies any vomiting, constipation, or diarrhea.  She has no urinary complaints.  Patient offers no further specific complaints today.  REVIEW OF SYSTEMS:   Review of Systems  Constitutional: Negative.  Negative for fever, malaise/fatigue and weight loss.  Respiratory: Negative.  Negative for cough, hemoptysis and shortness of breath.   Cardiovascular: Negative.  Negative for chest pain and leg swelling.  Gastrointestinal: Negative.  Negative for abdominal pain, constipation, diarrhea and nausea.  Genitourinary: Positive for flank pain. Negative for dysuria.  Musculoskeletal: Negative for back pain and joint pain.  Skin: Negative.  Negative for rash.  Neurological: Negative.  Negative for dizziness, sensory change, focal weakness, weakness and headaches.  Psychiatric/Behavioral: Negative.  The patient is not nervous/anxious.     As per HPI. Otherwise, a complete review of systems is negative.  PAST MEDICAL HISTORY: Past Medical History:  Diagnosis Date  . Abnormal echocardiogram   . Acid reflux   . Allergic rhinitis   .  Anemia    HAD TO HAVE IRON INFUSIONS BACK IN 2015  . Arthritis    bil hands and back  . Asthma   . Bloody discharge from left nipple   . Cancer of right lung ([place]) 2015   lung cancer - chemo, Dr Genevive Bi removed RML Lobectomy.   . Colon polyps   . COPD (chronic obstructive pulmonary disease) ([place])   . Dyspnea    WITH EXERTION  . Family history of breast cancer   . Family history of cervical cancer   . Family history of colon cancer   . Family history of skin cancer   . Hypertension   . Malignant neoplasm of middle lobe of right lung ([place])   . Mitral valve prolapse   . Mitral valve prolapse   . Non-small cell lung cancer ([place])   . Personal history of chemotherapy    F/U Lung Cancer  . Personal history of radiation therapy    Lung cancer  . Primary cancer of right middle lobe of lung ([place]) 03/11/2016  . Squamous cell carcinoma of skin 07/07/2009   right supraclavicular/EDC  . Stage IV adenocarcinoma of lung, unspecified laterality ([place])     PAST SURGICAL HISTORY: Past Surgical History:  Procedure Laterality Date  . ABDOMINAL HYSTERECTOMY    . BAND HEMORRHOIDECTOMY    . BREAST BIOPSY Left 03/01/2019   Procedure: LEFT BREAST EXCISIONAL BIOPSY WITH NEEDLE LOCALIZATION;  Surgeon: Herbert Pun, MD;  Location: [facility] ORS;  Service: General;  Laterality: Left;  . BREAST EXCISIONAL BIOPSY Left 1994 (-), 2004 (-)   benign  . BREAST EXCISIONAL BIOPSY Left 03/01/2019   - INTRADUCTAL PAPILLOMA WITH SCLEROSIS.   Marland Kitchen  COLONOSCOPY  2008    Dr. Donnella Sham  . COLONOSCOPY WITH PROPOFOL N/A 04/12/2019   Procedure: COLONOSCOPY WITH PROPOFOL;  Surgeon: Lollie Sails, MD;  [facility]: [facility] ENDOSCOPY;  Service: Endoscopy;  Laterality: N/A;  . IR RADIOLOGIST EVAL & MGMT  08/28/2019  . IR RADIOLOGIST EVAL & MGMT  10/30/2019  . IR RADIOLOGIST EVAL & MGMT  11/26/2019  . KNEE SURGERY  2007  . LUNG LOBECTOMY Right    middle lobe  . PORTACATH PLACEMENT Right 02/09/2017   Procedure: INSERTION PORT-A-CATH;   Surgeon: Nestor Lewandowsky, MD;  Location: [facility] ORS;  Service: General;  Laterality: Right;  . RADIOLOGY WITH ANESTHESIA N/A 10/02/2019   Procedure: MICROWAVE THERMA ABLATION;  Surgeon: Aletta Edouard, MD;  Location: [facility] ORS;  Service: Radiology;  Laterality: N/A;  . SALIVARY GLAND SURGERY Left 1983   with lymph node removal also  . SALIVARY GLAND SURGERY     salivary gland removal   . UPPER GI ENDOSCOPY  2008  . VIDEO BRONCHOSCOPY WITH ENDOBRONCHIAL ULTRASOUND Right 01/24/2017   Procedure: VIDEO BRONCHOSCOPY WITH ENDOBRONCHIAL ULTRASOUND;  Surgeon: [name], MD;  Location: [facility] ORS;  Service: Pulmonary;  Laterality: Right;    FAMILY HISTORY: Family History  Problem Relation Age of Onset  . Breast cancer Maternal Aunt 60  . Alzheimer's disease Maternal Aunt   . Breast cancer Paternal Aunt        dx. in her late 19s  . Breast cancer Maternal Grandmother        dx. in her 1s  . Cervical cancer Maternal Grandmother 60  . Colon cancer Maternal Grandmother        dx. in her 53s  . Breast cancer Maternal Aunt 60  . Alzheimer's disease Maternal Aunt   . Breast cancer Paternal Aunt        dx. in her late 51s  . Heart disease Paternal Aunt   . Healthy Mother   . Healthy Father   . Colon cancer Father 10  . Skin cancer Father   . Heart attack Father   . Heart attack Paternal Grandfather   . Skin cancer Paternal Aunt   . Heart attack Paternal Uncle 59  . Heart attack Paternal Uncle     ADVANCED DIRECTIVES (Y/N):  N  HEALTH MAINTENANCE: Social History   Tobacco Use  . Smoking status: Never Smoker  . Smokeless tobacco: Never Used  Vaping Use  . Vaping Use: Never used  Substance Use Topics  . Alcohol use: Yes    Alcohol/week: 7.0 standard drinks    Types: 7 Glasses of wine per week  . Drug use: No     Colonoscopy:  PAP:  Bone density:  Lipid panel:  Allergies  Allergen Reactions  . Other Other (See Comments)    Cat gut sutures get infected Cat gut sutures  get infected  . Penicillins Swelling and Rash    Did it involve swelling of the face/tongue/throat, SOB, or low BP? Unknown Did it involve sudden or severe rash/hives, skin peeling, or any reaction on the inside of your mouth or nose? No Did you need to seek medical attention at a hospital or doctor's office? Yes When did it last happen?30 + years  If all above answers are "NO", may proceed with cephalosporin use.    . Sulfa Antibiotics Nausea And Vomiting    Current Outpatient Medications  Medication Sig Dispense Refill  . albuterol (PROVENTIL HFA;VENTOLIN HFA) 108 (90 Base) MCG/ACT inhaler Inhale 1-2 puffs into the  lungs every 6 (six) hours as needed for wheezing or shortness of breath.    Marland Kitchen ascorbic acid (VITAMIN C) 100 MG tablet Take 100 mg by mouth daily.     . B Complex Vitamins (VITAMIN B COMPLEX PO) Take 1 tablet by mouth daily.     . Biotin w/ Vitamins C & E (HAIR/SKIN/NAILS PO) Take 1 tablet by mouth daily.    . Calcium Carb-Cholecalciferol (CALCIUM 600+D) 600-800 MG-UNIT TABS Take 2 tablets by mouth daily.    . Cholecalciferol (D3 MAXIMUM STRENGTH) 5000 units capsule Take 5,000 Units by mouth daily.    . clobetasol ointment (TEMOVATE) 6.14 % Apply 1 application topically 2 (two) times daily. Dry area and apply to mouth sores twice daily as needed for up to 2 weeks. 15 g 0  . COLLAGEN PO Take 1,000 mg by mouth daily.    . cyclobenzaprine (FLEXERIL) 5 MG tablet Take 1 tablet (5 mg total) by mouth 3 (three) times daily as needed for muscle spasms. 30 tablet 0  . diclofenac Sodium (VOLTAREN) 1 % GEL Apply 2 g topically 4 (four) times daily as needed (pain).     . fexofenadine (ALLEGRA) 180 MG tablet Take 180 mg by mouth daily as needed for allergies.     . fluticasone (FLONASE) 50 MCG/ACT nasal spray Place 2 sprays into both nostrils daily as needed for allergies or rhinitis.    . Fluticasone-Salmeterol (ADVAIR) 250-50 MCG/DOSE AEPB Inhale 1 puff into the lungs 2 (two) times  daily as needed (for respiratory issues.).    Marland Kitchen folic acid (FOLVITE) 1 MG tablet Take 1 tablet (1 mg total) by mouth daily. Start 5-7 days before Alimta chemotherapy. Continue until 21 days after Alimta completed. 100 tablet 3  . lansoprazole (PREVACID) 30 MG capsule Take 30 mg by mouth daily.     Marland Kitchen levofloxacin (LEVAQUIN) 500 MG tablet Take 1 tablet (500 mg total) by mouth daily. 7 tablet 0  . lidocaine-prilocaine (EMLA) cream Apply 1 application topically as needed. Apply generously over the Mediport 45 minutes prior to chemotherapy. 30 g 0  . lidocaine-prilocaine (EMLA) cream Apply to affected area once 30 g 3  . losartan (COZAAR) 25 MG tablet Take 25 mg by mouth at bedtime.     . magic mouthwash SOLN Take 5-10 mLs by mouth 4 (four) times daily as needed for mouth pain. 480 mL 0  . ondansetron (ZOFRAN) 8 MG tablet Take 1 tablet (8 mg total) by mouth every 8 (eight) hours as needed for nausea or vomiting (start 3 days; after chemo). 40 tablet 1  . ondansetron (ZOFRAN) 8 MG tablet Take 1 tablet (8 mg total) by mouth 2 (two) times daily as needed (Nausea or vomiting). 60 tablet 1  . polyethylene glycol (MIRALAX / GLYCOLAX) packet Take 17 g by mouth daily as needed for mild constipation.    . predniSONE (STERAPRED UNI-PAK 21 TAB) 10 MG (21) TBPK tablet Take as directed. 21 tablet 0  . PREMARIN 1.25 MG tablet Take 1.25 mg by mouth daily.   1  . prochlorperazine (COMPAZINE) 10 MG tablet TAKE 1 TABLET (10 MG TOTAL) BY MOUTH EVERY 6 (SIX) HOURS AS NEEDED FOR NAUSEA OR VOMITING. 40 tablet 1  . prochlorperazine (COMPAZINE) 10 MG tablet Take 1 tablet (10 mg total) by mouth every 6 (six) hours as needed (Nausea or vomiting). 60 tablet 1  . traMADol (ULTRAM) 50 MG tablet Take 1 tablet (50 mg total) by mouth every 6 (six) hours as needed.  30 tablet 0  . triamcinolone (KENALOG) 0.1 % paste Use as directed 1 application in the mouth or throat 2 (two) times daily. 15 g 12  . zinc gluconate 50 MG tablet Take 50 mg  by mouth daily.    . fluconazole (DIFLUCAN) 100 MG tablet Take 1 tablet (100 mg total) by mouth daily. 1 tablet 0   No current facility-administered medications for this visit.   Facility-Administered Medications Ordered in Other Visits  Medication Dose Route Frequency Provider Last Rate Last Admin  . heparin lock flush 100 unit/mL  500 Units Intravenous Once Lloyd Huger, MD      . heparin lock flush 100 unit/mL  500 Units Intravenous Once Lloyd Huger, MD      . PEMEtrexed (ALIMTA) 800 mg in sodium chloride 0.9 % 100 mL chemo infusion  500 mg/m2 (Order-Specific) Intravenous Once Lloyd Huger, MD      . sodium chloride flush (NS) 0.9 % injection 10 mL  10 mL Intravenous PRN Lloyd Huger, MD      . sodium chloride flush (NS) 0.9 % injection 10 mL  10 mL Intravenous PRN Lloyd Huger, MD   10 mL at 01/22/18 1419    OBJECTIVE: Vitals:   04/14/20 1115  BP: 132/89  Pulse: (!) 114  Resp: 20  Temp: 99.8 F (37.7 C)     Body mass index is 22.18 kg/m.    ECOG FS:1 - Symptomatic but completely ambulatory  General: Well-developed, well-nourished, no acute distress. Eyes: Pink conjunctiva, anicteric sclera. HEENT: Normocephalic, moist mucous membranes. Lungs: No audible wheezing or coughing. Heart: Regular rate and rhythm. Abdomen: Soft, nontender, no obvious distention. Musculoskeletal: No edema, cyanosis, or clubbing. Neuro: Alert, answering all questions appropriately. Cranial nerves grossly intact. Skin: No rashes or petechiae noted. Psych: Normal affect.   LAB RESULTS:  Lab Results  Component Value Date   NA 137 04/14/2020   K 4.1 04/14/2020   CL 101 04/14/2020   CO2 26 04/14/2020   GLUCOSE 104 (H) 04/14/2020   BUN 12 04/14/2020   CREATININE 0.87 04/14/2020   CALCIUM 8.8 (L) 04/14/2020   PROT 7.5 04/14/2020   ALBUMIN 3.0 (L) 04/14/2020   AST 26 04/14/2020   ALT 22 04/14/2020   ALKPHOS 135 (H) 04/14/2020   BILITOT 0.6 04/14/2020    GFRNONAA >60 04/14/2020   GFRAA >60 03/23/2020    Lab Results  Component Value Date   WBC 5.9 04/14/2020   NEUTROABS 4.2 04/14/2020   HGB 9.8 (L) 04/14/2020   HCT 29.8 (L) 04/14/2020   MCV 81.2 04/14/2020   PLT 694 (H) 04/14/2020     STUDIES: No results found.  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  She initially received 4 cycles of adjuvant cisplatin and Alimta completing on January 23, 2014.  Patient was on the [facility] protocol for maintenance Tarceva versus placebo, but discontinued treatment in November 2015 because of significant side effects.  Although blinded, presumption was she was receiving Tarceva.  She recently was noted to have a recurrence confirmed by right paratracheal lymph node biopsy on January 24, 2017.  PET scan on February 03, 2017 did not reveal any distant metastasis.  She underwent treatment with weekly carboplatinum and Taxol along with daily XRT finishing in October 2018.  She initiated maintenance durvalumab on June 29, 2017.  This was discontinued on October 02, 2017 due to progressive disease with a malignant pleural  effusion.  Patient received Tagrisso from April 2019 through September 2021.  She then initiated single agent pemetrexed on March 23, 2020.    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  PLAN:   1. 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  PET scan results from July 09, 2019 reviewed independently which revealed mild progression of disease in her lungs with an isolated left kidney metastasis that has undergone ablation.  Her most recent PET scan on March 11, 2020 reviewed independently revealed continued progression of disease in her lungs.  Discontinue Tagrisso and initiate single agent pemetrexed.  If there is not an adequate response, can consider adding in cisplatin  or carboplatin.  Omnisec testing revealed a ERBB2 gain as well as a high tumor mutational burden.  Because of this, afatinib and Beryle Flock may be options in the future to assess whether any bypass mutations are evident.  Patient tolerated cycle 1 well although recovery was complicated by Covid infection.  Proceed with cycle 2 today.  Return to clinic in 3 weeks for further evaluation and consideration of cycle 3. 2.  Diarrhea: Patient does not complain of this today.  Continue Lomotil as prescribed. 3.  Cough:  Chronic and unchanged, monitor. 4.  Breast lesion: Benign papilloma.  Patient underwent lumpectomy on March 01, 2019.  Her most recent mammogram on March 03, 2020 was reported as BI-RADS 2.   5.  Right pleural effusion: Malignant.  Patient had repeat thoracentesis in May 2021.  Pemetrexed as above. 6.  Nausea: Patient does not complain of this today.  Continue current antiemetics as needed. 7.  Anemia: Chronic and unchanged.  Patient's hemoglobin is 9.8. 8.  Covid positivity: Patient received monoclonal antibody infusion.  She has now completed quarantine.  Proceed with treatment as above. 9.  Thrombocytosis: Likely reactive, monitor.     Patient expressed understanding and was in agreement with this plan. She also understands that She can call clinic at any time with any questions, concerns, or complaints.    Lloyd Huger, MD   04/14/2020 12:51 PM

## 2020-04-13 ENCOUNTER — Other Ambulatory Visit: Payer: Medicare Other

## 2020-04-13 ENCOUNTER — Ambulatory Visit: Payer: Medicare Other

## 2020-04-13 ENCOUNTER — Ambulatory Visit: Payer: Medicare Other | Admitting: Oncology

## 2020-04-14 ENCOUNTER — Other Ambulatory Visit: Payer: Medicare Other

## 2020-04-14 ENCOUNTER — Ambulatory Visit: Payer: Medicare Other

## 2020-04-14 ENCOUNTER — Inpatient Hospital Stay: Payer: Medicare Other | Attending: Oncology

## 2020-04-14 ENCOUNTER — Ambulatory Visit: Payer: Medicare Other | Admitting: Oncology

## 2020-04-14 ENCOUNTER — Encounter: Payer: Self-pay | Admitting: Oncology

## 2020-04-14 ENCOUNTER — Inpatient Hospital Stay: Payer: Medicare Other

## 2020-04-14 ENCOUNTER — Inpatient Hospital Stay (HOSPITAL_BASED_OUTPATIENT_CLINIC_OR_DEPARTMENT_OTHER): Payer: Medicare Other | Admitting: Oncology

## 2020-04-14 ENCOUNTER — Other Ambulatory Visit: Payer: Self-pay

## 2020-04-14 ENCOUNTER — Other Ambulatory Visit: Payer: Self-pay | Admitting: Oncology

## 2020-04-14 VITALS — BP 132/89 | HR 114 | Temp 99.8°F | Resp 20 | Wt 125.2 lb

## 2020-04-14 DIAGNOSIS — C3491 Malignant neoplasm of unspecified part of right bronchus or lung: Secondary | ICD-10-CM | POA: Insufficient documentation

## 2020-04-14 DIAGNOSIS — C7902 Secondary malignant neoplasm of left kidney and renal pelvis: Secondary | ICD-10-CM | POA: Diagnosis not present

## 2020-04-14 DIAGNOSIS — Z95828 Presence of other vascular implants and grafts: Secondary | ICD-10-CM

## 2020-04-14 DIAGNOSIS — D649 Anemia, unspecified: Secondary | ICD-10-CM | POA: Diagnosis not present

## 2020-04-14 DIAGNOSIS — C3490 Malignant neoplasm of unspecified part of unspecified bronchus or lung: Secondary | ICD-10-CM | POA: Diagnosis not present

## 2020-04-14 DIAGNOSIS — J910 Malignant pleural effusion: Secondary | ICD-10-CM | POA: Diagnosis not present

## 2020-04-14 DIAGNOSIS — Z5111 Encounter for antineoplastic chemotherapy: Secondary | ICD-10-CM | POA: Diagnosis present

## 2020-04-14 DIAGNOSIS — Z23 Encounter for immunization: Secondary | ICD-10-CM | POA: Diagnosis not present

## 2020-04-14 LAB — CBC WITH DIFFERENTIAL/PLATELET
Abs Immature Granulocytes: 0.01 10*3/uL (ref 0.00–0.07)
Basophils Absolute: 0.1 10*3/uL (ref 0.0–0.1)
Basophils Relative: 1 %
Eosinophils Absolute: 0.2 10*3/uL (ref 0.0–0.5)
Eosinophils Relative: 3 %
HCT: 29.8 % — ABNORMAL LOW (ref 36.0–46.0)
Hemoglobin: 9.8 g/dL — ABNORMAL LOW (ref 12.0–15.0)
Immature Granulocytes: 0 %
Lymphocytes Relative: 14 %
Lymphs Abs: 0.8 10*3/uL (ref 0.7–4.0)
MCH: 26.7 pg (ref 26.0–34.0)
MCHC: 32.9 g/dL (ref 30.0–36.0)
MCV: 81.2 fL (ref 80.0–100.0)
Monocytes Absolute: 0.7 10*3/uL (ref 0.1–1.0)
Monocytes Relative: 11 %
Neutro Abs: 4.2 10*3/uL (ref 1.7–7.7)
Neutrophils Relative %: 71 %
Platelets: 694 10*3/uL — ABNORMAL HIGH (ref 150–400)
RBC: 3.67 MIL/uL — ABNORMAL LOW (ref 3.87–5.11)
RDW: 16.4 % — ABNORMAL HIGH (ref 11.5–15.5)
WBC: 5.9 10*3/uL (ref 4.0–10.5)
nRBC: 0 % (ref 0.0–0.2)

## 2020-04-14 LAB — COMPREHENSIVE METABOLIC PANEL
ALT: 22 U/L (ref 0–44)
AST: 26 U/L (ref 15–41)
Albumin: 3 g/dL — ABNORMAL LOW (ref 3.5–5.0)
Alkaline Phosphatase: 135 U/L — ABNORMAL HIGH (ref 38–126)
Anion gap: 10 (ref 5–15)
BUN: 12 mg/dL (ref 8–23)
CO2: 26 mmol/L (ref 22–32)
Calcium: 8.8 mg/dL — ABNORMAL LOW (ref 8.9–10.3)
Chloride: 101 mmol/L (ref 98–111)
Creatinine, Ser: 0.87 mg/dL (ref 0.44–1.00)
GFR, Estimated: >60 mL/min (ref >60)
Glucose, Bld: 104 mg/dL — ABNORMAL HIGH (ref 70–99)
Potassium: 4.1 mmol/L (ref 3.5–5.1)
Sodium: 137 mmol/L (ref 135–145)
Total Bilirubin: 0.6 mg/dL (ref 0.3–1.2)
Total Protein: 7.5 g/dL (ref 6.5–8.1)

## 2020-04-14 LAB — PHOSPHORUS: Phosphorus: 4.2 mg/dL (ref 2.5–4.6)

## 2020-04-14 LAB — MAGNESIUM: Magnesium: 2 mg/dL (ref 1.7–2.4)

## 2020-04-14 MED ORDER — HEPARIN SOD (PORK) LOCK FLUSH 100 UNIT/ML IV SOLN
INTRAVENOUS | Status: AC
  Filled 2020-04-14: qty 5

## 2020-04-14 MED ORDER — INFLUENZA VAC A&B SA ADJ QUAD 0.5 ML IM PRSY
0.5000 mL | PREFILLED_SYRINGE | Freq: Once | INTRAMUSCULAR | Status: AC
  Administered 2020-04-14: 0.5 mL via INTRAMUSCULAR
  Filled 2020-04-14: qty 0.5

## 2020-04-14 MED ORDER — SODIUM CHLORIDE 0.9 % IV SOLN
Freq: Once | INTRAVENOUS | Status: AC
  Filled 2020-04-14: qty 250

## 2020-04-14 MED ORDER — HEPARIN SOD (PORK) LOCK FLUSH 100 UNIT/ML IV SOLN
500.0000 [IU] | Freq: Once | INTRAVENOUS | Status: AC
  Administered 2020-04-14: 500 [IU] via INTRAVENOUS
  Filled 2020-04-14: qty 5

## 2020-04-14 MED ORDER — PROCHLORPERAZINE MALEATE 10 MG PO TABS
10.0000 mg | ORAL_TABLET | Freq: Once | ORAL | Status: AC
  Administered 2020-04-14: 10 mg via ORAL
  Filled 2020-04-14: qty 1

## 2020-04-14 MED ORDER — FLUCONAZOLE 100 MG PO TABS: 100.0000 mg | ORAL_TABLET | Freq: Every day | ORAL | 0 refills | Status: DC

## 2020-04-14 MED ORDER — SODIUM CHLORIDE 0.9% FLUSH
10.0000 mL | Freq: Once | INTRAVENOUS | Status: AC
  Administered 2020-04-14: 10 mL via INTRAVENOUS
  Filled 2020-04-14: qty 10

## 2020-04-14 MED ORDER — SODIUM CHLORIDE 0.9 % IV SOLN
500.0000 mg/m2 | Freq: Once | INTRAVENOUS | Status: AC
  Administered 2020-04-14: 800 mg via INTRAVENOUS
  Filled 2020-04-14: qty 20

## 2020-04-14 NOTE — Progress Notes (Signed)
Patient here today for follow up and treatment consideration regarding lung cancer. Patient reports ongoing pain, is taking Tramadol 50 mg but it does not seem strong enough at this time.

## 2020-04-14 NOTE — Progress Notes (Signed)
Pulse Rate: 114. MD, Dr. Grayland Ormond, notified and aware. Per MD order: proceed with scheduled Alimta treatment today.

## 2020-04-28 ENCOUNTER — Encounter: Payer: Self-pay | Admitting: Oncology

## 2020-04-28 ENCOUNTER — Other Ambulatory Visit: Payer: Self-pay | Admitting: Oncology

## 2020-04-28 MED ORDER — TRAMADOL HCL 50 MG PO TABS: 50.0000 mg | ORAL_TABLET | Freq: Four times a day (QID) | ORAL | 0 refills | Status: DC | PRN

## 2020-04-29 ENCOUNTER — Other Ambulatory Visit: Payer: Self-pay | Admitting: Internal Medicine

## 2020-05-02 NOTE — Progress Notes (Signed)
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  INTERVAL HISTORY: Patient returns to clinic today for further evaluation and consideration of cycle 3 of pemetrexed.  She currently feels well.  She does not complain of right flank pain today.  She has no neurologic complaints. She has a good appetite and denies weight loss.  She denies any chest pain, shortness of breath, cough, or hemoptysis.  She denies any nausea, vomiting, constipation, or diarrhea.  She has no urinary complaints.  Patient offers no further specific complaints today.  REVIEW OF SYSTEMS:   Review of Systems  Constitutional: Negative.  Negative for fever, malaise/fatigue and weight loss.  Respiratory: Negative.  Negative for cough, hemoptysis and shortness of breath.   Cardiovascular: Negative.  Negative for chest pain and leg swelling.  Gastrointestinal: Negative.  Negative for abdominal pain, constipation, diarrhea and nausea.  Genitourinary: Negative.  Negative for dysuria and flank pain.  Musculoskeletal: Negative for back pain and joint pain.  Skin: Negative.  Negative for rash.  Neurological: Negative.  Negative for dizziness, sensory change, focal weakness, weakness and headaches.  Psychiatric/Behavioral: Negative.  The patient is not nervous/anxious.     As per HPI. Otherwise, a complete review of systems is negative.  PAST MEDICAL HISTORY: Past Medical History:  Diagnosis Date  . Abnormal echocardiogram   . Acid reflux   . Allergic rhinitis   . Anemia    HAD TO HAVE IRON INFUSIONS BACK IN 2015  . Arthritis    bil hands and back  . Asthma   . Bloody discharge from left nipple   .  Cancer of right lung ([place]) 2015   lung cancer - chemo, Dr Genevive Bi removed RML Lobectomy.   . Colon polyps   . COPD (chronic obstructive pulmonary disease) ([place])   . Dyspnea    WITH EXERTION  . Family history of breast cancer   . Family history of cervical cancer   . Family history of colon cancer   . Family history of skin cancer   . Hypertension   . Malignant neoplasm of middle lobe of right lung ([place])   . Mitral valve prolapse   . Mitral valve prolapse   . Non-small cell lung cancer ([place])   . Personal history of chemotherapy    F/U Lung Cancer  . Personal history of radiation therapy    Lung cancer  . Primary cancer of right middle lobe of lung ([place]) 03/11/2016  . Squamous cell carcinoma of skin 07/07/2009   right supraclavicular/EDC  . Stage IV adenocarcinoma of lung, unspecified laterality ([place])     PAST SURGICAL HISTORY: Past Surgical History:  Procedure Laterality Date  . ABDOMINAL HYSTERECTOMY    . BAND HEMORRHOIDECTOMY    . BREAST BIOPSY Left 03/01/2019   Procedure: LEFT BREAST EXCISIONAL BIOPSY WITH NEEDLE LOCALIZATION;  Surgeon: Herbert Pun, MD;  Location: [facility] ORS;  Service: General;  Laterality: Left;  . BREAST EXCISIONAL BIOPSY Left 1994 (-), 2004 (-)   benign  . BREAST EXCISIONAL BIOPSY Left 03/01/2019   - INTRADUCTAL PAPILLOMA WITH SCLEROSIS.   Marland Kitchen COLONOSCOPY  2008    Dr. Donnella Sham  . COLONOSCOPY WITH PROPOFOL N/A 04/12/2019   Procedure: COLONOSCOPY WITH PROPOFOL;  Surgeon:  Lollie Sails, MD;  Location: [facility] ENDOSCOPY;  Service: Endoscopy;  Laterality: N/A;  . IR RADIOLOGIST EVAL & MGMT  08/28/2019  . IR RADIOLOGIST EVAL & MGMT  10/30/2019  . IR RADIOLOGIST EVAL & MGMT  11/26/2019  . KNEE SURGERY  2007  . LUNG LOBECTOMY Right    middle lobe  . PORTACATH PLACEMENT Right 02/09/2017   Procedure: INSERTION PORT-A-CATH;  Surgeon: Nestor Lewandowsky, MD;  Location: [facility] ORS;  Service: General;  Laterality: Right;  . RADIOLOGY WITH ANESTHESIA N/A 10/02/2019    Procedure: MICROWAVE THERMA ABLATION;  Surgeon: Aletta Edouard, MD;  Location: [facility] ORS;  Service: Radiology;  Laterality: N/A;  . SALIVARY GLAND SURGERY Left 1983   with lymph node removal also  . SALIVARY GLAND SURGERY     salivary gland removal   . UPPER GI ENDOSCOPY  2008  . VIDEO BRONCHOSCOPY WITH ENDOBRONCHIAL ULTRASOUND Right 01/24/2017   Procedure: VIDEO BRONCHOSCOPY WITH ENDOBRONCHIAL ULTRASOUND;  Surgeon: [name], MD;  Location: [facility] ORS;  Service: Pulmonary;  Laterality: Right;    FAMILY HISTORY: Family History  Problem Relation Age of Onset  . Breast cancer Maternal Aunt 60  . Alzheimer's disease Maternal Aunt   . Breast cancer Paternal Aunt        dx. in her late 55s  . Breast cancer Maternal Grandmother        dx. in her 50s  . Cervical cancer Maternal Grandmother 60  . Colon cancer Maternal Grandmother        dx. in her 6s  . Breast cancer Maternal Aunt 60  . Alzheimer's disease Maternal Aunt   . Breast cancer Paternal Aunt        dx. in her late 33s  . Heart disease Paternal Aunt   . Healthy Mother   . Healthy Father   . Colon cancer Father 47  . Skin cancer Father   . Heart attack Father   . Heart attack Paternal Grandfather   . Skin cancer Paternal Aunt   . Heart attack Paternal Uncle 4  . Heart attack Paternal Uncle     ADVANCED DIRECTIVES (Y/N):  N  HEALTH MAINTENANCE: Social History   Tobacco Use  . Smoking status: Never Smoker  . Smokeless tobacco: Never Used  Vaping Use  . Vaping Use: Never used  Substance Use Topics  . Alcohol use: Yes    Alcohol/week: 7.0 standard drinks    Types: 7 Glasses of wine per week  . Drug use: No     Colonoscopy:  PAP:  Bone density:  Lipid panel:  Allergies  Allergen Reactions  . Other Other (See Comments)    Cat gut sutures get infected Cat gut sutures get infected  . Penicillins Swelling and Rash    Did it involve swelling of the face/tongue/throat, SOB, or low BP? Unknown Did it  involve sudden or severe rash/hives, skin peeling, or any reaction on the inside of your mouth or nose? No Did you need to seek medical attention at a hospital or doctor's office? Yes When did it last happen?30 + years  If all above answers are "NO", may proceed with cephalosporin use.    . Sulfa Antibiotics Nausea And Vomiting    Current Outpatient Medications  Medication Sig Dispense Refill  . albuterol (PROVENTIL HFA;VENTOLIN HFA) 108 (90 Base) MCG/ACT inhaler Inhale 1-2 puffs into the lungs every 6 (six) hours as needed for wheezing or shortness of breath.    Marland Kitchen ascorbic acid (VITAMIN C) 100 MG  tablet Take 100 mg by mouth daily.     . B Complex Vitamins (VITAMIN B COMPLEX PO) Take 1 tablet by mouth daily.     . Biotin w/ Vitamins C & E (HAIR/SKIN/NAILS PO) Take 1 tablet by mouth daily.    . Calcium Carb-Cholecalciferol (CALCIUM 600+D) 600-800 MG-UNIT TABS Take 2 tablets by mouth daily.    . Cholecalciferol (D3 MAXIMUM STRENGTH) 5000 units capsule Take 5,000 Units by mouth daily.    . clobetasol ointment (TEMOVATE) 1.61 % Apply 1 application topically 2 (two) times daily. Dry area and apply to mouth sores twice daily as needed for up to 2 weeks. 15 g 0  . COLLAGEN PO Take 1,000 mg by mouth daily.    . cyclobenzaprine (FLEXERIL) 5 MG tablet Take 1 tablet (5 mg total) by mouth 3 (three) times daily as needed for muscle spasms. 30 tablet 0  . diclofenac Sodium (VOLTAREN) 1 % GEL Apply 2 g topically 4 (four) times daily as needed (pain).     . fexofenadine (ALLEGRA) 180 MG tablet Take 180 mg by mouth daily as needed for allergies.     . fluconazole (DIFLUCAN) 100 MG tablet Take 1 tablet (100 mg total) by mouth daily. 1 tablet 0  . fluticasone (FLONASE) 50 MCG/ACT nasal spray Place 2 sprays into both nostrils daily as needed for allergies or rhinitis.    . Fluticasone-Salmeterol (ADVAIR) 250-50 MCG/DOSE AEPB Inhale 1 puff into the lungs 2 (two) times daily as needed (for respiratory  issues.).    Marland Kitchen folic acid (FOLVITE) 1 MG tablet Take 1 tablet (1 mg total) by mouth daily. Start 5-7 days before Alimta chemotherapy. Continue until 21 days after Alimta completed. 100 tablet 3  . lansoprazole (PREVACID) 30 MG capsule Take 30 mg by mouth daily.     Marland Kitchen levofloxacin (LEVAQUIN) 500 MG tablet Take 1 tablet (500 mg total) by mouth daily. 7 tablet 0  . lidocaine-prilocaine (EMLA) cream Apply 1 application topically as needed. Apply generously over the Mediport 45 minutes prior to chemotherapy. 30 g 0  . lidocaine-prilocaine (EMLA) cream Apply to affected area once 30 g 3  . losartan (COZAAR) 25 MG tablet Take 25 mg by mouth at bedtime.     . magic mouthwash SOLN Take 5-10 mLs by mouth 4 (four) times daily as needed for mouth pain. 480 mL 0  . ondansetron (ZOFRAN) 8 MG tablet Take 1 tablet (8 mg total) by mouth every 8 (eight) hours as needed for nausea or vomiting (start 3 days; after chemo). 40 tablet 1  . ondansetron (ZOFRAN) 8 MG tablet Take 1 tablet (8 mg total) by mouth 2 (two) times daily as needed (Nausea or vomiting). 60 tablet 1  . polyethylene glycol (MIRALAX / GLYCOLAX) packet Take 17 g by mouth daily as needed for mild constipation.    . predniSONE (STERAPRED UNI-PAK 21 TAB) 10 MG (21) TBPK tablet Take as directed. 21 tablet 0  . PREMARIN 1.25 MG tablet Take 1.25 mg by mouth daily.   1  . prochlorperazine (COMPAZINE) 10 MG tablet TAKE 1 TABLET (10 MG TOTAL) BY MOUTH EVERY 6 (SIX) HOURS AS NEEDED FOR NAUSEA OR VOMITING. 40 tablet 1  . prochlorperazine (COMPAZINE) 10 MG tablet Take 1 tablet (10 mg total) by mouth every 6 (six) hours as needed (Nausea or vomiting). 60 tablet 1  . traMADol (ULTRAM) 50 MG tablet Take 1-2 tablets (50-100 mg total) by mouth every 6 (six) hours as needed. 90 tablet 0  .  triamcinolone (KENALOG) 0.1 % paste Use as directed 1 application in the mouth or throat 2 (two) times daily. 15 g 12  . zinc gluconate 50 MG tablet Take 50 mg by mouth daily.     No  current facility-administered medications for this visit.   Facility-Administered Medications Ordered in Other Visits  Medication Dose Route Frequency Provider Last Rate Last Admin  . heparin lock flush 100 unit/mL  500 Units Intravenous Once Lloyd Huger, MD      . sodium chloride flush (NS) 0.9 % injection 10 mL  10 mL Intravenous PRN Lloyd Huger, MD      . sodium chloride flush (NS) 0.9 % injection 10 mL  10 mL Intravenous PRN Lloyd Huger, MD   10 mL at 01/22/18 1419    OBJECTIVE: There were no vitals filed for this visit.   There is no height or weight on file to calculate BMI.    ECOG FS:1 - Symptomatic but completely ambulatory  General: Well-developed, well-nourished, no acute distress. Eyes: Pink conjunctiva, anicteric sclera. HEENT: Normocephalic, moist mucous membranes. Lungs: No audible wheezing or coughing. Heart: Regular rate and rhythm. Abdomen: Soft, nontender, no obvious distention. Musculoskeletal: No edema, cyanosis, or clubbing. Neuro: Alert, answering all questions appropriately. Cranial nerves grossly intact. Skin: No rashes or petechiae noted. Psych: Normal affect.   LAB RESULTS:  Lab Results  Component Value Date   NA 137 04/14/2020   K 4.1 04/14/2020   CL 101 04/14/2020   CO2 26 04/14/2020   GLUCOSE 104 (H) 04/14/2020   BUN 12 04/14/2020   CREATININE 0.87 04/14/2020   CALCIUM 8.8 (L) 04/14/2020   PROT 7.5 04/14/2020   ALBUMIN 3.0 (L) 04/14/2020   AST 26 04/14/2020   ALT 22 04/14/2020   ALKPHOS 135 (H) 04/14/2020   BILITOT 0.6 04/14/2020   GFRNONAA >60 04/14/2020   GFRAA >60 03/23/2020    Lab Results  Component Value Date   WBC 5.9 04/14/2020   NEUTROABS 4.2 04/14/2020   HGB 9.8 (L) 04/14/2020   HCT 29.8 (L) 04/14/2020   MCV 81.2 04/14/2020   PLT 694 (H) 04/14/2020     STUDIES: No results found.  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  She  initially received 4 cycles of adjuvant cisplatin and Alimta completing on January 23, 2014.  Patient was on the [facility] protocol for maintenance Tarceva versus placebo, but discontinued treatment in November 2015 because of significant side effects.  Although blinded, presumption was she was receiving Tarceva.  She recently was noted to have a recurrence confirmed by right paratracheal lymph node biopsy on January 24, 2017.  PET scan on February 03, 2017 did not reveal any distant metastasis.  She underwent treatment with weekly carboplatinum and Taxol along with daily XRT finishing in October 2018.  She initiated maintenance durvalumab on June 29, 2017.  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  Patient received Tagrisso from April 2019 through September 2021.  She then initiated single agent pemetrexed on March 23, 2020.    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  PLAN:   1. 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  PET scan results from July 09, 2019 reviewed independently which revealed mild progression of disease in her lungs with an isolated left kidney metastasis  that has undergone ablation.  Her most recent PET scan on March 11, 2020 reviewed independently revealed continued progression of disease in her lungs.  Discontinue Tagrisso and initiate single agent pemetrexed.  If there is not an adequate response, can consider adding in cisplatin or carboplatin.  Omnisec testing revealed a ERBB2 gain as well as a high tumor mutational burden.  Because of this, afatinib and Beryle Flock may be options in the future to assess whether any bypass mutations are evident.  Patient had a second opinion at [facility] last week they are assessing the possibility of enrolling in a clinical trial which the patient is interested.  Attempts to  reach Dr. Sharlet Salina were unsuccessful.  Proceed with cycle 3 of pemetrexed today.  Return to clinic in 3 weeks for further evaluation and consideration of cycle 4.  Patient will then have a PET scan 1 week after her fourth infusion for further evaluation.   2.  Diarrhea: Patient does not complain of this today.  Continue Lomotil as prescribed. 3.  Cough: Patient does not complain of this today.  Monitor.   4.  Breast lesion: Benign papilloma.  Patient underwent lumpectomy on March 01, 2019.  Her most recent mammogram on March 03, 2020 was reported as BI-RADS 2.   5.  Right pleural effusion: Malignant.  Patient had repeat thoracentesis in May 2021.  Pemetrexed as above. 6.  Nausea: Patient does not complain of this today.  Continue current antiemetics as needed. 7.  Anemia: Chronic and unchanged.  Patient's hemoglobin has trended down to 9.4. 8.  Covid positivity: Patient received monoclonal antibody infusion.  She has now completed quarantine.  Proceed with treatment as above. 9.  Thrombocytosis: Reactive, monitor.   Patient expressed understanding and was in agreement with this plan. She also understands that She can call clinic at any time with any questions, concerns, or complaints.    Lloyd Huger, MD   05/02/2020 9:31 AM

## 2020-05-04 ENCOUNTER — Encounter: Payer: Self-pay | Admitting: Oncology

## 2020-05-04 ENCOUNTER — Inpatient Hospital Stay: Payer: Medicare Other

## 2020-05-04 ENCOUNTER — Inpatient Hospital Stay: Payer: Medicare Other | Attending: Oncology

## 2020-05-04 ENCOUNTER — Other Ambulatory Visit: Payer: Self-pay

## 2020-05-04 ENCOUNTER — Inpatient Hospital Stay: Payer: Medicare Other | Attending: Oncology | Admitting: Oncology

## 2020-05-04 VITALS — HR 98

## 2020-05-04 VITALS — BP 112/77 | HR 102 | Temp 98.0°F | Resp 20 | Wt 123.3 lb

## 2020-05-04 DIAGNOSIS — C3490 Malignant neoplasm of unspecified part of unspecified bronchus or lung: Secondary | ICD-10-CM

## 2020-05-04 DIAGNOSIS — Z5111 Encounter for antineoplastic chemotherapy: Secondary | ICD-10-CM | POA: Diagnosis present

## 2020-05-04 DIAGNOSIS — J910 Malignant pleural effusion: Secondary | ICD-10-CM | POA: Insufficient documentation

## 2020-05-04 DIAGNOSIS — C342 Malignant neoplasm of middle lobe, bronchus or lung: Secondary | ICD-10-CM | POA: Diagnosis present

## 2020-05-04 DIAGNOSIS — C7902 Secondary malignant neoplasm of left kidney and renal pelvis: Secondary | ICD-10-CM | POA: Insufficient documentation

## 2020-05-04 DIAGNOSIS — D649 Anemia, unspecified: Secondary | ICD-10-CM | POA: Insufficient documentation

## 2020-05-04 DIAGNOSIS — D75838 Other thrombocytosis: Secondary | ICD-10-CM | POA: Insufficient documentation

## 2020-05-04 DIAGNOSIS — Z95828 Presence of other vascular implants and grafts: Secondary | ICD-10-CM

## 2020-05-04 LAB — CBC WITH DIFFERENTIAL/PLATELET
Abs Immature Granulocytes: 0.03 10*3/uL (ref 0.00–0.07)
Basophils Absolute: 0.1 10*3/uL (ref 0.0–0.1)
Basophils Relative: 1 %
Eosinophils Absolute: 0.1 10*3/uL (ref 0.0–0.5)
Eosinophils Relative: 2 %
HCT: 29.4 % — ABNORMAL LOW (ref 36.0–46.0)
Hemoglobin: 9.4 g/dL — ABNORMAL LOW (ref 12.0–15.0)
Immature Granulocytes: 1 %
Lymphocytes Relative: 13 %
Lymphs Abs: 0.7 10*3/uL (ref 0.7–4.0)
MCH: 26.8 pg (ref 26.0–34.0)
MCHC: 32 g/dL (ref 30.0–36.0)
MCV: 83.8 fL (ref 80.0–100.0)
Monocytes Absolute: 0.5 10*3/uL (ref 0.1–1.0)
Monocytes Relative: 10 %
Neutro Abs: 4.1 10*3/uL (ref 1.7–7.7)
Neutrophils Relative %: 73 %
Platelets: 564 10*3/uL — ABNORMAL HIGH (ref 150–400)
RBC: 3.51 MIL/uL — ABNORMAL LOW (ref 3.87–5.11)
RDW: 18.6 % — ABNORMAL HIGH (ref 11.5–15.5)
WBC: 5.6 10*3/uL (ref 4.0–10.5)
nRBC: 0 % (ref 0.0–0.2)

## 2020-05-04 LAB — COMPREHENSIVE METABOLIC PANEL
ALT: 21 U/L (ref 0–44)
AST: 28 U/L (ref 15–41)
Albumin: 2.9 g/dL — ABNORMAL LOW (ref 3.5–5.0)
Alkaline Phosphatase: 128 U/L — ABNORMAL HIGH (ref 38–126)
Anion gap: 10 (ref 5–15)
BUN: 12 mg/dL (ref 8–23)
CO2: 24 mmol/L (ref 22–32)
Calcium: 8.9 mg/dL (ref 8.9–10.3)
Chloride: 103 mmol/L (ref 98–111)
Creatinine, Ser: 0.85 mg/dL (ref 0.44–1.00)
GFR, Estimated: >60 mL/min (ref >60)
Glucose, Bld: 125 mg/dL — ABNORMAL HIGH (ref 70–99)
Potassium: 3.7 mmol/L (ref 3.5–5.1)
Sodium: 137 mmol/L (ref 135–145)
Total Bilirubin: 0.3 mg/dL (ref 0.3–1.2)
Total Protein: 7.5 g/dL (ref 6.5–8.1)

## 2020-05-04 LAB — MAGNESIUM: Magnesium: 2 mg/dL (ref 1.7–2.4)

## 2020-05-04 LAB — PHOSPHORUS: Phosphorus: 4 mg/dL (ref 2.5–4.6)

## 2020-05-04 MED ORDER — SODIUM CHLORIDE 0.9 % IV SOLN
500.0000 mg/m2 | Freq: Once | INTRAVENOUS | Status: AC
  Administered 2020-05-04: 800 mg via INTRAVENOUS
  Filled 2020-05-04: qty 20

## 2020-05-04 MED ORDER — CYANOCOBALAMIN 1000 MCG/ML IJ SOLN
1000.0000 ug | Freq: Once | INTRAMUSCULAR | Status: AC
  Administered 2020-05-04: 1000 ug via INTRAMUSCULAR
  Filled 2020-05-04: qty 1

## 2020-05-04 MED ORDER — PROCHLORPERAZINE MALEATE 10 MG PO TABS
10.0000 mg | ORAL_TABLET | Freq: Once | ORAL | Status: AC
  Administered 2020-05-04: 10 mg via ORAL
  Filled 2020-05-04: qty 1

## 2020-05-04 MED ORDER — SODIUM CHLORIDE 0.9% FLUSH
10.0000 mL | Freq: Once | INTRAVENOUS | Status: AC
  Administered 2020-05-04: 10 mL via INTRAVENOUS
  Filled 2020-05-04: qty 10

## 2020-05-04 MED ORDER — HEPARIN SOD (PORK) LOCK FLUSH 100 UNIT/ML IV SOLN
500.0000 [IU] | Freq: Once | INTRAVENOUS | Status: DC | PRN
  Filled 2020-05-04: qty 5

## 2020-05-04 MED ORDER — HEPARIN SOD (PORK) LOCK FLUSH 100 UNIT/ML IV SOLN
500.0000 [IU] | Freq: Once | INTRAVENOUS | Status: AC
  Administered 2020-05-04: 500 [IU] via INTRAVENOUS
  Filled 2020-05-04: qty 5

## 2020-05-04 MED ORDER — SODIUM CHLORIDE 0.9 % IV SOLN
Freq: Once | INTRAVENOUS | Status: AC
  Filled 2020-05-04: qty 250

## 2020-05-04 NOTE — Progress Notes (Signed)
Pt tolerated infusion well. No s/s of distress or reaction noted. Pt stable at discharge.  

## 2020-05-09 ENCOUNTER — Encounter: Payer: Self-pay | Admitting: Oncology

## 2020-05-13 ENCOUNTER — Encounter: Payer: Self-pay | Admitting: Dermatology

## 2020-05-13 ENCOUNTER — Other Ambulatory Visit: Payer: Self-pay

## 2020-05-13 ENCOUNTER — Ambulatory Visit (INDEPENDENT_AMBULATORY_CARE_PROVIDER_SITE_OTHER): Payer: Medicare Other | Admitting: Dermatology

## 2020-05-13 DIAGNOSIS — L820 Inflamed seborrheic keratosis: Secondary | ICD-10-CM | POA: Diagnosis not present

## 2020-05-13 DIAGNOSIS — D229 Melanocytic nevi, unspecified: Secondary | ICD-10-CM

## 2020-05-13 DIAGNOSIS — L821 Other seborrheic keratosis: Secondary | ICD-10-CM

## 2020-05-13 DIAGNOSIS — L578 Other skin changes due to chronic exposure to nonionizing radiation: Secondary | ICD-10-CM

## 2020-05-13 DIAGNOSIS — D1800 Hemangioma unspecified site: Secondary | ICD-10-CM

## 2020-05-13 DIAGNOSIS — L814 Other melanin hyperpigmentation: Secondary | ICD-10-CM

## 2020-05-13 DIAGNOSIS — Z1283 Encounter for screening for malignant neoplasm of skin: Secondary | ICD-10-CM

## 2020-05-13 DIAGNOSIS — Z85828 Personal history of other malignant neoplasm of skin: Secondary | ICD-10-CM

## 2020-05-13 NOTE — Progress Notes (Signed)
Follow-Up Visit   Subjective  Lauren Page is a 69 y.o. female who presents for the following: Annual Exam (Hx of SCC ). Lesions on the L breast and R leg that have changed since patient started chemotherapy for lung cancer. She is concerned and would like them checked.  The patient presents for Total-Body Skin Exam (TBSE) for skin cancer screening and mole check.  The following portions of the chart were reviewed this encounter and updated as appropriate:  Tobacco  Allergies  Meds  Problems  Med Hx  Surg Hx  Fam Hx     Review of Systems:  No other skin or systemic complaints except as noted in HPI or Assessment and Plan.  Objective  Well appearing patient in no apparent distress; mood and affect are within normal limits.  A full examination was performed including scalp, head, eyes, ears, nose, lips, neck, chest, axillae, abdomen, back, buttocks, bilateral upper extremities, bilateral lower extremities, hands, feet, fingers, toes, fingernails, and toenails. All findings within normal limits unless otherwise noted below.  Objective  R leg x 3, L lat breast x 1, L arm x 3, L post waistline x 1, R inframammary x 1 (9): Erythematous keratotic or waxy stuck-on papule or plaque.   Assessment & Plan  Inflamed seborrheic keratosis (9) R leg x 3, L lat breast x 1, L arm x 3, L post waistline x 1, R inframammary x 1  Destruction of lesion - R leg x 3, L lat breast x 1, L arm x 3, L post waistline x 1, R inframammary x 1 Complexity: simple   Destruction method: cryotherapy   Informed consent: discussed and consent obtained   Timeout:  patient name, date of birth, surgical site, and procedure verified Lesion destroyed using liquid nitrogen: Yes   Region frozen until ice ball extended beyond lesion: Yes   Outcome: patient tolerated procedure well with no complications   Post-procedure details: wound care instructions given    Skin cancer screening   Lentigines - Scattered tan  macules - Discussed due to sun exposure - Benign, observe - Call for any changes  Seborrheic Keratoses - Stuck-on, waxy, tan-brown papules and plaques  - Discussed benign etiology and prognosis. - Observe - Call for any changes  Melanocytic Nevi - Tan-brown and/or pink-flesh-colored symmetric macules and papules - Benign appearing on exam today - Observation - Call clinic for new or changing moles - Recommend daily use of broad spectrum spf 30+ sunscreen to sun-exposed areas.   Hemangiomas - Red papules - Discussed benign nature - Observe - Call for any changes  Actinic Damage - Chronic, secondary to cumulative UV/sun exposure - diffuse scaly erythematous macules with underlying dyspigmentation - Recommend daily broad spectrum sunscreen SPF 30+ to sun-exposed areas, reapply every 2 hours as needed.  - Call for new or changing lesions.  History of Squamous Cell Carcinoma of the Skin - No evidence of recurrence today - No lymphadenopathy - Recommend regular full body skin exams - Recommend daily broad spectrum sunscreen SPF 30+ to sun-exposed areas, reapply every 2 hours as needed.  - Call if any new or changing lesions are noted between office visits  Skin cancer screening performed today.  Lung Cancer - currently getting Chemotherapy.  Return in about 1 year (around 05/13/2021) for TBSE.  Luther Redo, CMA, am acting as scribe for Sarina Ser, MD .  Documentation: I have reviewed the above documentation for accuracy and completeness, and I agree with the  above.  Sarina Ser, MD

## 2020-05-14 ENCOUNTER — Telehealth: Payer: Self-pay | Admitting: *Deleted

## 2020-05-14 ENCOUNTER — Encounter: Payer: Self-pay | Admitting: Dermatology

## 2020-05-14 NOTE — Telephone Encounter (Signed)
Dr Sharlet Salina from [facility] called needing to discuss a Clinical Trial that this pt is eligble for and wants to know if they can proceed with enrolling her in a HER 2 amplification trial.  He tried to call your cell, but your vm was full. He requests that you call his office at [phone]. He said he will be there all day and whoever answers the phone can get him for you.

## 2020-05-20 ENCOUNTER — Other Ambulatory Visit: Payer: Self-pay | Admitting: Hematology & Oncology

## 2020-05-22 ENCOUNTER — Encounter: Payer: Self-pay | Admitting: Internal Medicine

## 2020-05-22 ENCOUNTER — Other Ambulatory Visit: Payer: Self-pay | Admitting: Internal Medicine

## 2020-05-22 ENCOUNTER — Other Ambulatory Visit: Payer: Self-pay

## 2020-05-22 ENCOUNTER — Ambulatory Visit (INDEPENDENT_AMBULATORY_CARE_PROVIDER_SITE_OTHER): Payer: Medicare Other | Admitting: Internal Medicine

## 2020-05-22 VITALS — BP 116/68 | HR 110 | Temp 97.3°F | Ht 63.0 in | Wt 120.2 lb

## 2020-05-22 DIAGNOSIS — C3490 Malignant neoplasm of unspecified part of unspecified bronchus or lung: Secondary | ICD-10-CM | POA: Diagnosis not present

## 2020-05-22 DIAGNOSIS — J449 Chronic obstructive pulmonary disease, unspecified: Secondary | ICD-10-CM

## 2020-05-22 DIAGNOSIS — J90 Pleural effusion, not elsewhere classified: Secondary | ICD-10-CM

## 2020-05-22 MED ORDER — FLUTICASONE-SALMETEROL 250-50 MCG/DOSE IN AEPB: 1.0000 | INHALATION_SPRAY | Freq: Two times a day (BID) | RESPIRATORY_TRACT | 10 refills | Status: DC | PRN

## 2020-05-22 MED ORDER — ALBUTEROL SULFATE HFA 108 (90 BASE) MCG/ACT IN AERS: 1.0000 | INHALATION_SPRAY | Freq: Four times a day (QID) | RESPIRATORY_TRACT | 10 refills | Status: AC | PRN

## 2020-05-22 NOTE — Patient Instructions (Addendum)
  Advair daily Albuterol as needed

## 2020-05-22 NOTE — Progress Notes (Signed)
[name] Consultation    Update: Pt seen and evaluated before pt going for procedure, new changes in exam have been noted.     Date: 05/22/2020  MRN# [id number] Lauren Page 1951-06-17    Lauren Page is a 69 y.o. old female seen in consultation for chief complaint of: SOB/DOE and COPD    PREVIOUS HPI 69 year old woman who is status post right middle lobectomy several years ago. She is now about 2-1/2 years out from a right thoracotomy and right middle lobectomy for a adenocarcinoma the lung with pleural invasion, she then completed 4 cycles of chemotherapy on January 23, 2014   About a year ago she did have a small nodule identified along the superior segment right lower lobe close to the area of the fissure, she was found to have a nodular density in the superior segment of the right lower lobe. A subsequent PET scan was performed which revealed no evidence of uptake in the hypermetabolic range.   A repeat CT scan July 2018 There are some bilateral hilar adenopathy which is increased slightly in size.  The largest lymph node now measures over 1.5 cm in the right hilum. The left hilum has a 9 mm lymph node. These have increased from before.   EBUS shows recurrence Of Adenocarcioma-underwent Chemo/RXT  CT scans subsequently showed RT lung pneumonitis from RXT  3.20.19 scans show lung damage and effusion RT sided  Patient also has increased WOB and SOB and cough CT shows pleural effusion as well US guided thoracentesis performed-+malgnant cells with adenocarcinoma  CC Follow-up shortness of breath Follow-up COPD   HPI  COPD seems to be stable at this time Chronic shortness of breath and dyspnea exertion seems to be a little worse Patient takes Advair every day She uses albuterol as needed  No of COPD exacerbation at this time No evidence of heart failure at this time No evidence or signs of infection at this time No respiratory distress No fevers,  chills, nausea, vomiting, diarrhea No evidence of lower extremity edema No evidence hemoptysis  PET scan results reviewed with the patient there is some increased pleural effusion and uptake in the right lung There is also a kidney mass that is PET avid  Patient is to have thoracentesis and CT abdomen pelvis on Friday  Patient is not in any acute distress at this time   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  She has now completed quarantine.  Proceed with treatment as above.   Medication:    Current Outpatient Medications:  .  albuterol (PROVENTIL HFA;VENTOLIN HFA) 108 (90 Base) MCG/ACT inhaler, Inhale 1-2 puffs into the lungs every 6 (six) hours as needed for wheezing or shortness of breath., Disp: , Rfl:  .  ascorbic acid (VITAMIN C) 100 MG tablet, Take 100 mg by mouth daily. , Disp: , Rfl:  .  B Complex Vitamins (VITAMIN B COMPLEX PO), Take 1 tablet by mouth daily. , Disp: , Rfl:  .  Biotin w/ Vitamins C & E (HAIR/SKIN/NAILS PO), Take 1 tablet by mouth daily., Disp: , Rfl:  .  Calcium Carb-Cholecalciferol (CALCIUM 600+D) 600-800 MG-UNIT TABS, Take 2 tablets by mouth daily., Disp: , Rfl:  .  Cholecalciferol (D3 MAXIMUM STRENGTH) 5000  units capsule, Take 5,000 Units by mouth daily., Disp: , Rfl:  .  clobetasol ointment (TEMOVATE) 5.32 %, Apply 1 application topically 2 (two) times daily. Dry area and apply to mouth sores twice daily as needed for up to 2 weeks., Disp: 15 g, Rfl: 0 .  COLLAGEN PO, Take 1,000 mg by mouth daily., Disp: , Rfl:  .  cyclobenzaprine (FLEXERIL) 5 MG tablet, Take 1 tablet (5 mg total) by mouth 3 (three) times daily as needed for muscle spasms., Disp: 30 tablet, Rfl: 0 .   diclofenac Sodium (VOLTAREN) 1 % GEL, Apply 2 g topically 4 (four) times daily as needed (pain). , Disp: , Rfl:  .  fexofenadine (ALLEGRA) 180 MG tablet, Take 180 mg by mouth daily as needed for allergies. , Disp: , Rfl:  .  fluticasone (FLONASE) 50 MCG/ACT nasal spray, Place 2 sprays into both nostrils daily as needed for allergies or rhinitis., Disp: , Rfl:  .  Fluticasone-Salmeterol (ADVAIR) 250-50 MCG/DOSE AEPB, Inhale 1 puff into the lungs 2 (two) times daily as needed (for respiratory issues.)., Disp: , Rfl:  .  folic acid (FOLVITE) 1 MG tablet, Take 1 tablet (1 mg total) by mouth daily. Start 5-7 days before Alimta chemotherapy. Continue until 21 days after Alimta completed., Disp: 100 tablet, Rfl: 3 .  lansoprazole (PREVACID) 30 MG capsule, Take 30 mg by mouth daily. , Disp: , Rfl:  .  lidocaine-prilocaine (EMLA) cream, Apply 1 application topically as needed. Apply generously over the Mediport 45 minutes prior to chemotherapy., Disp: 30 g, Rfl: 0 .  lidocaine-prilocaine (EMLA) cream, Apply to affected area once, Disp: 30 g, Rfl: 3 .  losartan (COZAAR) 25 MG tablet, Take 25 mg by mouth at bedtime. , Disp: , Rfl:  .  magic mouthwash SOLN, Take 5-10 mLs by mouth 4 (four) times daily as needed for mouth pain., Disp: 480 mL, Rfl: 0 .  ondansetron (ZOFRAN) 8 MG tablet, Take 1 tablet (8 mg total) by mouth every 8 (eight) hours as needed for nausea or vomiting (start 3 days; after chemo)., Disp: 40 tablet, Rfl: 1 .  ondansetron (ZOFRAN) 8 MG tablet, Take 1 tablet (8 mg total) by mouth 2 (two) times daily as needed (Nausea or vomiting)., Disp: 60 tablet, Rfl: 1 .  polyethylene glycol (MIRALAX / GLYCOLAX) packet, Take 17 g by mouth daily as needed for mild constipation., Disp: , Rfl:  .  PREMARIN 1.25 MG tablet, Take 1.25 mg by mouth daily. , Disp: , Rfl: 1 .  prochlorperazine (COMPAZINE) 10 MG tablet, TAKE 1 TABLET (10 MG TOTAL) BY MOUTH EVERY 6 (SIX) HOURS AS NEEDED FOR NAUSEA OR VOMITING., Disp: 40  tablet, Rfl: 1 .  prochlorperazine (COMPAZINE) 10 MG tablet, Take 1 tablet (10 mg total) by mouth every 6 (six) hours as needed (Nausea or vomiting)., Disp: 60 tablet, Rfl: 1 .  traMADol (ULTRAM) 50 MG tablet, Take 1-2 tablets (50-100 mg total) by mouth every 6 (six) hours as needed., Disp: 90 tablet, Rfl: 0 .  triamcinolone (KENALOG) 0.1 % paste, Use as directed 1 application in the mouth or throat 2 (two) times daily., Disp: 15 g, Rfl: 12 .  zinc gluconate 50 MG tablet, Take 50 mg by mouth daily., Disp: , Rfl:  No current facility-administered medications for this visit.  Facility-Administered Medications Ordered in Other Visits:  .  heparin lock flush 100 unit/mL, 500 Units, Intravenous, Once, Finnegan, Kathlene November, MD .  sodium chloride flush (NS) 0.9 % injection 10 mL,  10 mL, Intravenous, PRN, Lloyd Huger, MD .  sodium chloride flush (NS) 0.9 % injection 10 mL, 10 mL, Intravenous, PRN, Lloyd Huger, MD, 10 mL at 01/22/18 1419   Allergies:  Other, Penicillins, and Sulfa antibiotics    Review of Systems:  Gen:  Denies  fever, sweats, chills weight loss  HEENT: Denies blurred vision, double vision, ear pain, eye pain, hearing loss, nose bleeds, sore throat Cardiac:  No dizziness, chest pain or heaviness, chest tightness,edema, No JVD Resp:   No cough, -sputum production, +shortness of breath,-wheezing, -hemoptysis,  Gi: Denies swallowing difficulty, stomach pain, nausea or vomiting, diarrhea, constipation, bowel incontinence Gu:  Denies bladder incontinence, burning urine Ext:   Denies Joint pain, stiffness or swelling Skin: Denies  skin rash, easy bruising or bleeding or hives Endoc:  Denies polyuria, polydipsia , polyphagia or weight change Psych:   Denies depression, insomnia or hallucinations  Other:  All other systems negative     BP 116/68 (BP Location: Left Arm, Cuff Size: Normal)   Pulse (!) 110   Temp (!) 97.3 F (36.3 C) (Temporal)   Ht 5\' 3"  (1.6 m)    Wt 120 lb 3.2 oz (54.5 kg)   SpO2 98%   BMI 21.29 kg/m    Physical Examination:   General Appearance: No distress  Neuro:without focal findings,  speech normal,  HEENT: PERRLA, EOM intact.   Pulmonary: normal breath sounds, No wheezing.  CardiovascularNormal S1,S2.  No m/r/g.   Abdomen: Benign, Soft, non-tender. Renal:  No costovertebral tenderness  GU:  Not performed at this time. Endoc: No evident thyromegaly Skin:   warm, no rashes, no ecchymosis  Extremities: normal, no cyanosis, clubbing. PSYCHIATRIC: Mood, affect within normal limits.   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  The importance of social distancing was discussed today. Hand Washing Techniques and avoid touching face was advised.     MEDICATION ADJUSTMENTS/LABS AND TESTS ORDERED: Advair daily Albuterol as needed   CURRENT MEDICATIONS REVIEWED AT LENGTH WITH PATIENT TODAY   Patient satisfied with Plan of action and management. All questions answered  Follow-up 6 months  Total time spent 35 minutes      Corrin Parker, M.D.  [facility]  Medical Director [place] Director [facility] Cardio-Pulmonary Department

## 2020-05-25 ENCOUNTER — Other Ambulatory Visit: Payer: Medicare Other

## 2020-05-25 ENCOUNTER — Other Ambulatory Visit: Payer: Self-pay | Admitting: Internal Medicine

## 2020-05-25 ENCOUNTER — Ambulatory Visit: Payer: Medicare Other | Admitting: Radiation Oncology

## 2020-05-25 ENCOUNTER — Ambulatory Visit: Payer: Medicare Other

## 2020-05-25 ENCOUNTER — Ambulatory Visit: Payer: Medicare Other | Admitting: Oncology

## 2020-05-27 NOTE — Progress Notes (Signed)
[place]  [phone] [phone]  ID: Lauren Page OB: [birth year]  MR#: [id number]  MGN#:[id number]  Patient Care Team: [name], MD as PCP - General (Internal Medicine) Lloyd Huger, MD as Consulting Physician (Oncology)  I connected with [name] on 06/02/20 at  3:00 PM EST by video enabled telemedicine visit and verified that I am speaking with the correct person using two identifiers.   I discussed the limitations, risks, security and privacy concerns of performing an evaluation and management service by telemedicine and the availability of in-person appointments. I also discussed with the patient that there may be a patient responsible charge related to this service. The patient expressed understanding and agreed to proceed.   Other persons participating in the visit and their role in the encounter: Patient, MD.  Patient's location: Home. Provider's location: Clinic.  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  INTERVAL HISTORY: Patient agreed to video assisted telemedicine visit for further evaluation and discussion of her imaging results.  She currently feels well and is asymptomatic.  She does not complain of right flank pain today.  She has no neurologic complaints. She has a good appetite and denies weight loss.  She denies any chest pain, shortness of breath, cough, or hemoptysis.  She denies any nausea, vomiting, constipation, or diarrhea.  She has no urinary complaints.  Patient offers no specific complaints today.  REVIEW OF SYSTEMS:   Review of Systems  Constitutional: Negative.  Negative for fever, malaise/fatigue and weight loss.  Respiratory: Negative.  Negative for cough, hemoptysis and shortness of breath.   Cardiovascular: Negative.  Negative for chest pain and leg swelling.  Gastrointestinal: Negative.  Negative for abdominal pain, constipation, diarrhea  and nausea.  Genitourinary: Negative.  Negative for dysuria and flank pain.  Musculoskeletal: Negative for back pain and joint pain.  Skin: Negative.  Negative for rash.  Neurological: Negative.  Negative for dizziness, sensory change, focal weakness, weakness and headaches.  Psychiatric/Behavioral: Negative.  The patient is not nervous/anxious.     As per HPI. Otherwise, a complete review of systems is negative.  PAST MEDICAL HISTORY: Past Medical History:  Diagnosis Date  . Abnormal echocardiogram   . Acid reflux   . Allergic rhinitis   . Anemia    HAD TO HAVE IRON INFUSIONS BACK IN 2015  . Arthritis    bil hands and back  . Asthma   . Bloody discharge from left nipple   . Cancer of right lung ([place]) 2015   lung cancer - chemo, Dr Genevive Bi removed RML Lobectomy.   . Colon polyps   . COPD (chronic obstructive pulmonary disease) ([place])   . Dyspnea    WITH EXERTION  . Family history of breast cancer   . Family history of cervical cancer   . Family history of colon cancer   . Family history of skin cancer   . Hypertension   . Malignant neoplasm of middle lobe of right lung ([place])   . Mitral valve prolapse   . Mitral valve prolapse   . Non-small cell lung cancer ([place])   . Personal history of chemotherapy    F/U Lung Cancer  . Personal history of radiation therapy    Lung cancer  . Primary cancer of right middle lobe of lung ([place]) 03/11/2016  . Squamous cell carcinoma of skin 07/07/2009   right supraclavicular/EDC  . Squamous cell carcinoma of skin 11/04/2019  Left pretibial. [facility].  . Stage IV adenocarcinoma of lung, unspecified laterality ([place])     PAST SURGICAL HISTORY: Past Surgical History:  Procedure Laterality Date  . ABDOMINAL HYSTERECTOMY    . BAND HEMORRHOIDECTOMY    . BREAST BIOPSY Left 03/01/2019   Procedure: LEFT BREAST EXCISIONAL BIOPSY WITH NEEDLE LOCALIZATION;  Surgeon: Herbert Pun, MD;  Location: [facility] ORS;  Service: General;  Laterality: Left;  .  BREAST EXCISIONAL BIOPSY Left 1994 (-), 2004 (-)   benign  . BREAST EXCISIONAL BIOPSY Left 03/01/2019   - INTRADUCTAL PAPILLOMA WITH SCLEROSIS.   Marland Kitchen COLONOSCOPY  2008    Dr. Donnella Sham  . COLONOSCOPY WITH PROPOFOL N/A 04/12/2019   Procedure: COLONOSCOPY WITH PROPOFOL;  Surgeon: Lollie Sails, MD;  Location: [facility] ENDOSCOPY;  Service: Endoscopy;  Laterality: N/A;  . IR RADIOLOGIST EVAL & MGMT  08/28/2019  . IR RADIOLOGIST EVAL & MGMT  10/30/2019  . IR RADIOLOGIST EVAL & MGMT  11/26/2019  . KNEE SURGERY  2007  . LUNG LOBECTOMY Right    middle lobe  . PORTACATH PLACEMENT Right 02/09/2017   Procedure: INSERTION PORT-A-CATH;  Surgeon: Nestor Lewandowsky, MD;  Location: [facility] ORS;  Service: General;  Laterality: Right;  . RADIOLOGY WITH ANESTHESIA N/A 10/02/2019   Procedure: MICROWAVE THERMA ABLATION;  Surgeon: Aletta Edouard, MD;  Location: [facility] ORS;  Service: Radiology;  Laterality: N/A;  . SALIVARY GLAND SURGERY Left 1983   with lymph node removal also  . SALIVARY GLAND SURGERY     salivary gland removal   . UPPER GI ENDOSCOPY  2008  . VIDEO BRONCHOSCOPY WITH ENDOBRONCHIAL ULTRASOUND Right 01/24/2017   Procedure: VIDEO BRONCHOSCOPY WITH ENDOBRONCHIAL ULTRASOUND;  Surgeon: [name], MD;  Location: [facility] ORS;  Service: Pulmonary;  Laterality: Right;    FAMILY HISTORY: Family History  Problem Relation Age of Onset  . Breast cancer Maternal Aunt 60  . Alzheimer's disease Maternal Aunt   . Breast cancer Paternal Aunt        dx. in her late 52s  . Breast cancer Maternal Grandmother        dx. in her 66s  . Cervical cancer Maternal Grandmother 60  . Colon cancer Maternal Grandmother        dx. in her 76s  . Breast cancer Maternal Aunt 60  . Alzheimer's disease Maternal Aunt   . Breast cancer Paternal Aunt        dx. in her late 34s  . Heart disease Paternal Aunt   . Healthy Mother   . Healthy Father   . Colon cancer Father 31  . Skin cancer Father   . Heart attack Father   . Heart  attack Paternal Grandfather   . Skin cancer Paternal Aunt   . Heart attack Paternal Uncle 79  . Heart attack Paternal Uncle     ADVANCED DIRECTIVES (Y/N):  N  HEALTH MAINTENANCE: Social History   Tobacco Use  . Smoking status: Never Smoker  . Smokeless tobacco: Never Used  Vaping Use  . Vaping Use: Never used  Substance Use Topics  . Alcohol use: Yes    Alcohol/week: 7.0 standard drinks    Types: 7 Glasses of wine per week  . Drug use: No     Colonoscopy:  PAP:  Bone density:  Lipid panel:  Allergies  Allergen Reactions  . Other Other (See Comments)    Cat gut sutures get infected Cat gut sutures get infected  . Penicillins Swelling and Rash    Did  it involve swelling of the face/tongue/throat, SOB, or low BP? Unknown Did it involve sudden or severe rash/hives, skin peeling, or any reaction on the inside of your mouth or nose? No Did you need to seek medical attention at a hospital or doctor's office? Yes When did it last happen?30 + years  If all above answers are "NO", may proceed with cephalosporin use.    . Sulfa Antibiotics Nausea And Vomiting    Current Outpatient Medications  Medication Sig Dispense Refill  . albuterol (VENTOLIN HFA) 108 (90 Base) MCG/ACT inhaler Inhale 1-2 puffs into the lungs every 6 (six) hours as needed for wheezing or shortness of breath. 1 g 10  . ascorbic acid (VITAMIN C) 100 MG tablet Take 100 mg by mouth daily.     . B Complex Vitamins (VITAMIN B COMPLEX PO) Take 1 tablet by mouth daily.     . Biotin w/ Vitamins C & E (HAIR/SKIN/NAILS PO) Take 1 tablet by mouth daily.    . Calcium Carb-Cholecalciferol (CALCIUM 600+D) 600-800 MG-UNIT TABS Take 2 tablets by mouth daily.    . Cholecalciferol (D3 MAXIMUM STRENGTH) 5000 units capsule Take 5,000 Units by mouth daily.    . clobetasol ointment (TEMOVATE) 3.23 % Apply 1 application topically 2 (two) times daily. Dry area and apply to mouth sores twice daily as needed for up to 2 weeks.  15 g 0  . COLLAGEN PO Take 1,000 mg by mouth daily.    . cyclobenzaprine (FLEXERIL) 5 MG tablet Take 1 tablet (5 mg total) by mouth 3 (three) times daily as needed for muscle spasms. 30 tablet 0  . diclofenac Sodium (VOLTAREN) 1 % GEL Apply 2 g topically 4 (four) times daily as needed (pain).     . fexofenadine (ALLEGRA) 180 MG tablet Take 180 mg by mouth daily as needed for allergies.     . fluticasone (FLONASE) 50 MCG/ACT nasal spray Place 2 sprays into both nostrils daily as needed for allergies or rhinitis.    . Fluticasone-Salmeterol (ADVAIR) 250-50 MCG/DOSE AEPB Inhale 1 puff into the lungs 2 (two) times daily as needed (for respiratory issues.). 60 each 10  . folic acid (FOLVITE) 1 MG tablet Take 1 tablet (1 mg total) by mouth daily. Start 5-7 days before Alimta chemotherapy. Continue until 21 days after Alimta completed. 100 tablet 3  . lansoprazole (PREVACID) 30 MG capsule Take 30 mg by mouth daily.     Marland Kitchen lidocaine-prilocaine (EMLA) cream Apply 1 application topically as needed. Apply generously over the Mediport 45 minutes prior to chemotherapy. 30 g 0  . lidocaine-prilocaine (EMLA) cream Apply to affected area once 30 g 3  . losartan (COZAAR) 25 MG tablet Take 25 mg by mouth at bedtime.     . magic mouthwash SOLN Take 5-10 mLs by mouth 4 (four) times daily as needed for mouth pain. 480 mL 0  . ondansetron (ZOFRAN) 8 MG tablet Take 1 tablet (8 mg total) by mouth every 8 (eight) hours as needed for nausea or vomiting (start 3 days; after chemo). 40 tablet 1  . ondansetron (ZOFRAN) 8 MG tablet Take 1 tablet (8 mg total) by mouth 2 (two) times daily as needed (Nausea or vomiting). 60 tablet 1  . polyethylene glycol (MIRALAX / GLYCOLAX) packet Take 17 g by mouth daily as needed for mild constipation.    . prochlorperazine (COMPAZINE) 10 MG tablet TAKE 1 TABLET (10 MG TOTAL) BY MOUTH EVERY 6 (SIX) HOURS AS NEEDED FOR NAUSEA OR VOMITING. [place]  tablet 1  . prochlorperazine (COMPAZINE) 10 MG tablet  Take 1 tablet (10 mg total) by mouth every 6 (six) hours as needed (Nausea or vomiting). 60 tablet 1  . traMADol (ULTRAM) 50 MG tablet Take 1-2 tablets (50-100 mg total) by mouth every 6 (six) hours as needed. 90 tablet 0  . triamcinolone (KENALOG) 0.1 % paste Use as directed 1 application in the mouth or throat 2 (two) times daily. 15 g 12  . zinc gluconate 50 MG tablet Take 50 mg by mouth daily.     No current facility-administered medications for this visit.   Facility-Administered Medications Ordered in Other Visits  Medication Dose Route Frequency Provider Last Rate Last Admin  . heparin lock flush 100 unit/mL  500 Units Intravenous Once Lloyd Huger, MD      . sodium chloride flush (NS) 0.9 % injection 10 mL  10 mL Intravenous PRN Lloyd Huger, MD      . sodium chloride flush (NS) 0.9 % injection 10 mL  10 mL Intravenous PRN Lloyd Huger, MD   10 mL at 01/22/18 1419    OBJECTIVE: There were no vitals filed for this visit.   There is no height or weight on file to calculate BMI.    ECOG FS:1 - Symptomatic but completely ambulatory  General: Well-developed, well-nourished, no acute distress. HEENT: Normocephalic. Neuro: Alert, answering all questions appropriately. Cranial nerves grossly intact. Psych: Normal affect.   LAB RESULTS:  Lab Results  Component Value Date   NA 137 05/04/2020   K 3.7 05/04/2020   CL 103 05/04/2020   CO2 24 05/04/2020   GLUCOSE 125 (H) 05/04/2020   BUN 12 05/04/2020   CREATININE 0.85 05/04/2020   CALCIUM 8.9 05/04/2020   PROT 7.5 05/04/2020   ALBUMIN 2.9 (L) 05/04/2020   AST 28 05/04/2020   ALT 21 05/04/2020   ALKPHOS 128 (H) 05/04/2020   BILITOT 0.3 05/04/2020   GFRNONAA >60 05/04/2020   GFRAA >60 03/23/2020    Lab Results  Component Value Date   WBC 5.6 05/04/2020   NEUTROABS 4.1 05/04/2020   HGB 9.4 (L) 05/04/2020   HCT 29.4 (L) 05/04/2020   MCV 83.8 05/04/2020   PLT 564 (H) 05/04/2020     STUDIES: NM PET  Image Restag (PS) Skull Base To Thigh  Result Date: 06/02/2020 CLINICAL DATA:  Subsequent treatment strategy for non-small cell lung cancer. EXAM: NUCLEAR MEDICINE PET SKULL BASE TO THIGH TECHNIQUE: 6.85 mCi F-18 FDG was injected intravenously. Full-ring PET imaging was performed from the skull base to thigh after the radiotracer. CT data was obtained and used for attenuation correction and anatomic localization. Fasting blood glucose: 74 mg/dl COMPARISON:  Prior PET CTs from 03/11/2020 and 07/09/2019 FINDINGS: Mediastinal blood pool activity: SUV max 2.0 Liver activity: SUV max NA NECK: No neck mass or upper cervical adenopathy. There is a persistent hypermetabolic left supraclavicular lymph node. This has enlarged slightly and measures 10 mm on image 60/3. It previously measured 8 mm. SUV max is 8.20 and was previously 4.77. Incidental CT findings: none CHEST: Stable appearing thick pleural rind of soft tissue with hypermetabolism. SUV max is 5.33 and was previously 7.38. No hypermetabolic mediastinal or hilar lymph nodes. No definite hypermetabolic pulmonary nodules. The left lung remains relatively clear. Few small scattered sub 4 mm nodules are unchanged. No breast masses or axillary adenopathy. 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 ABDOMEN/PELVIS:  Left-sided retroperitoneal nodal mass is progressive. It measures approximately 3.3 x 2.5 cm on image 161/3 and previously measured 2.5 x 2.5 cm. The SUV max is 12.96 and was previously 11.93. Slightly more inferiorly there is a new 8 mm para-aortic node on image 169/3. SUV max is 5.85. There is also a new smaller node more inferiorly just above the aortic bifurcation. It measures 5.5 mm on image 184/3. SUV max is 3.81. No findings for hepatic or adrenal gland metastasis. No pelvic adenopathy or inguinal adenopathy. Incidental CT findings: none SKELETON: No findings  for osseous metastatic disease. Incidental CT findings: none IMPRESSION: 1. Interval progression of left supraclavicular metastatic adenopathy. The left-sided retroperitoneal adenopathy is also progressive as detailed above. 2. Stable appearing extensive pleural disease involving the right hemithorax with associated pleural effusion and extensive interstitial scarring, likely radiation change. 3. A few small scattered pulmonary nodules. Recommend attention on future scans. 4. No findings for solid abdominal organ metastatic disease or osseous metastatic disease. Electronically Signed   By: Marijo Sanes M.D.   On: 06/02/2020 14:30    ONCOLOGY HISTORY: Patient initially underwent resection in May 2015 and was noted to have the EGFR mutation.  She initially received 4 cycles of adjuvant cisplatin and Alimta completing on January 23, 2014.  Patient was on the [facility] protocol for maintenance Tarceva versus placebo, but discontinued treatment in November 2015 because of significant side effects.  Although blinded, presumption was she was receiving Tarceva.  She recently was noted to have a recurrence confirmed by right paratracheal lymph node biopsy on January 24, 2017.  PET scan on February 03, 2017 did not reveal any distant metastasis.  She underwent treatment with weekly carboplatinum and Taxol along with daily XRT finishing in October 2018.  She initiated maintenance durvalumab on June 29, 2017.  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  Patient received Tagrisso from April 2019 through September 2021. PET scan results from July 09, 2019 revealed mild progression of disease in her lungs with an isolated left kidney metastasis that has undergone ablation. PET scan on March 11, 2020 revealed continued progression of disease in her lungs.    She subsequently discontinued Tagrisso and initiated single agent pemetrexed on March 23, 2020.     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  PLAN:   1. 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    PET scan results from June 02, 2020 reviewed independently and report as above with stable disease in her lungs, but with mild progression in her lymph nodes.  Patient only received 3 doses of pemetrexed which was interrupted by Covid infection.  It is possible that her worsening lymphadenopathy is residual inflammation from Covid. Omnisec testing revealed a ERBB2 gain as well as a high tumor mutational burden.  Because of this, afatinib and Beryle Flock may be options in the future to assess whether any bypass mutations are evident.  Her last dose of pemetrexed was on May 04, 2020.  Patient wishes to consider the clinical trial option at [facility] and has a phone call with Dr. Sharlet Salina tomorrow.  If she enrolls in the clinical trial, will transfer all care to [facility].  If she opts not to do the clinical trial, will continue treatment with pemetrexed and consider adding in carboplatinum in hopes of a better tumor response.  Follow-up will be based  on patient's decision for treatment.  2.  Diarrhea: Patient does not complain of this today.  Continue Lomotil as prescribed. 3.  Cough: Chronic and unchanged.  Patient does not complain of this today.  Monitor.   4.  Breast lesion: Benign papilloma.  Patient underwent lumpectomy on March 01, 2019.  Her most recent mammogram on March 03, 2020 was reported as BI-RADS 2.   5.  Right pleural effusion: Malignant.  Patient had repeat thoracentesis in May 2021.   6.  Nausea: Chronic and unchanged.  Continue ondansetron as needed. 7.  Anemia: Chronic and unchanged.  Patient's most recent hemoglobin hemoglobin trended down to 9.4. 8.  Covid positivity: Patient received monoclonal antibody infusion.  She has now completed quarantine.  9.   Thrombocytosis: Reactive, monitor.  I provided 20 minutes of face-to-face video visit time during this encounter which included chart review, counseling, and coordination of care as documented above.    Patient expressed understanding and was in agreement with this plan. She also understands that She can call clinic at any time with any questions, concerns, or complaints.    Lloyd Huger, MD   06/02/2020 7:36 PM

## 2020-06-01 ENCOUNTER — Inpatient Hospital Stay: Payer: Medicare Other

## 2020-06-02 ENCOUNTER — Inpatient Hospital Stay (HOSPITAL_BASED_OUTPATIENT_CLINIC_OR_DEPARTMENT_OTHER): Payer: Medicare Other | Admitting: Oncology

## 2020-06-02 ENCOUNTER — Ambulatory Visit
Admission: RE | Admit: 2020-06-02 | Discharge: 2020-06-02 | Disposition: A | Discharge status: Home / Self Care | Payer: Medicare Other | Source: Ambulatory Visit | Attending: Oncology | Admitting: Oncology

## 2020-06-02 ENCOUNTER — Encounter: Payer: Self-pay | Admitting: Radiation Oncology

## 2020-06-02 ENCOUNTER — Other Ambulatory Visit: Payer: Self-pay

## 2020-06-02 ENCOUNTER — Encounter: Payer: Self-pay | Admitting: Oncology

## 2020-06-02 DIAGNOSIS — C3490 Malignant neoplasm of unspecified part of unspecified bronchus or lung: Secondary | ICD-10-CM

## 2020-06-02 DIAGNOSIS — C770 Secondary and unspecified malignant neoplasm of lymph nodes of head, face and neck: Secondary | ICD-10-CM | POA: Insufficient documentation

## 2020-06-02 DIAGNOSIS — R590 Localized enlarged lymph nodes: Secondary | ICD-10-CM | POA: Diagnosis not present

## 2020-06-02 DIAGNOSIS — J90 Pleural effusion, not elsewhere classified: Secondary | ICD-10-CM | POA: Diagnosis not present

## 2020-06-02 LAB — GLUCOSE, CAPILLARY: Glucose-Capillary: 74 mg/dL (ref 70–99)

## 2020-06-02 MED ORDER — FLUDEOXYGLUCOSE F - 18 (FDG) INJECTION
6.2000 | Freq: Once | INTRAVENOUS | Status: AC | PRN
  Administered 2020-06-02: 6.85 via INTRAVENOUS

## 2020-06-02 NOTE — Progress Notes (Signed)
Patient denies any concerns today, visit today for PET results.

## 2020-06-03 ENCOUNTER — Encounter: Payer: Self-pay | Admitting: Radiation Oncology

## 2020-06-03 ENCOUNTER — Ambulatory Visit
Admission: RE | Admit: 2020-06-03 | Discharge: 2020-06-03 | Disposition: A | Discharge status: Home / Self Care | Payer: Medicare Other | Source: Ambulatory Visit | Attending: Radiation Oncology | Admitting: Radiation Oncology

## 2020-06-03 VITALS — BP 124/83 | HR 108 | Temp 98.5°F | Wt 119.0 lb

## 2020-06-03 DIAGNOSIS — C342 Malignant neoplasm of middle lobe, bronchus or lung: Secondary | ICD-10-CM

## 2020-06-03 NOTE — Progress Notes (Signed)
Radiation Oncology Follow up Note  Name: Lauren Page   Date:   06/03/2020 MRN:  [id number] DOB: 1950/08/22    This 69 y.o. female presents to the clinic today for follow-up status post salvage radiation therapy to her right lung status post lobectomy in 2015 for adenocarcinoma with documented evidence of progression of disease.Marland Kitchen  REFERRING PROVIDER: [name], MD  HPI: Patient is a 69 year old female.  With known stage IV adenocarcinoma the lung with progression by PET CT scan yesterday in both her chest right hilar region and retroperitoneal lymph nodes.  She was being treated with single agent pemetrexed although that was interrupted by Covid infection.  She had a PET CT scan yesterday showing interval progression of left supraclavicular metastatic adenopathy as well as left-sided retroperitoneal adenopathy.  She has extensive pleural disease involving the right hemithorax with associated pleural effusion extensive interstitial scarring.  She has been seen at [facility] and has been in accepted for a clinical trial which she is considering entering.  She specifically [place] cough hemoptysis or chest tightness she continues to have some right sided flank pain.  COMPLICATIONS OF TREATMENT: none  FOLLOW UP COMPLIANCE: keeps appointments   PHYSICAL EXAM:  BP 124/83    Pulse (!) 108    Temp 98.5 F (36.9 C) (Tympanic)    Wt 119 lb (54 kg)    BMI 21.08 kg/m  Thin slightly cachectic female in NAD.  Well-developed well-nourished patient in NAD. HEENT reveals PERLA, EOMI, discs not visualized.  Oral cavity is clear. No oral mucosal lesions are identified. Neck is clear without evidence of cervical or supraclavicular adenopathy. Lungs are clear to A&P. Cardiac examination is essentially unremarkable with regular rate and rhythm without murmur rub or thrill. Abdomen is benign with no organomegaly or masses noted. Motor sensory and DTR levels are equal and symmetric in the upper and lower extremities.  Cranial nerves II through XII are grossly intact. Proprioception is intact. No peripheral adenopathy or edema is identified. No motor or sensory levels are noted. Crude visual fields are within normal range.  RADIOLOGY RESULTS: PET CT scan reviewed compatible with above-stated findings  PLAN: At the present time patient has excepted entering clinical trial at [place] with Dr. Sharlet Salina as her lead physician.  I believe she will transfer her care over there.  I have asked to see her back in 6 months for follow-up.  I see no real areas for palliation at this point in time.  She is already had appellation to a renal mass metastatic lesion which seems successful.  Patient knows to call with any concerns at any time.  I would like to take this opportunity to thank you for allowing me to participate in the care of your patient.Noreene Filbert, MD

## 2020-06-30 IMAGING — CT CT CHEST W/ CM
2 of 5 series · 14 of 46 positions shown, 16 images · IV contrast (omnipaque)
Comparison: CT abdomen dated 07/26/2019. PET-CT dated 07/09/2019.
CT chest dated 06/20/2019.

CLINICAL DATA: Follow-up right lung cancer, diagnosed 6442. Status
post left renal mass ablation in September 2019.

EXAM:
CT CHEST WITH CONTRAST
CT ABDOMEN AND PELVIS WITH AND WITHOUT CONTRAST
TECHNIQUE: Multidetector CT imaging of the chest was performed during
intravenous contrast administration. Multidetector CT imaging of the
abdomen and pelvis was performed following the standard protocol
before and during bolus administration of intravenous contrast.
CONTRAST:  100mL OMNIPAQUE IOHEXOL 300 MG/ML  SOLN

[Series 503: renal arterial · axial · arterial · 0.70mm/px · z∈[-156,+201]mm · 11 of 143 slices shown, 13 images]
[im 12/143  soft-tissue]
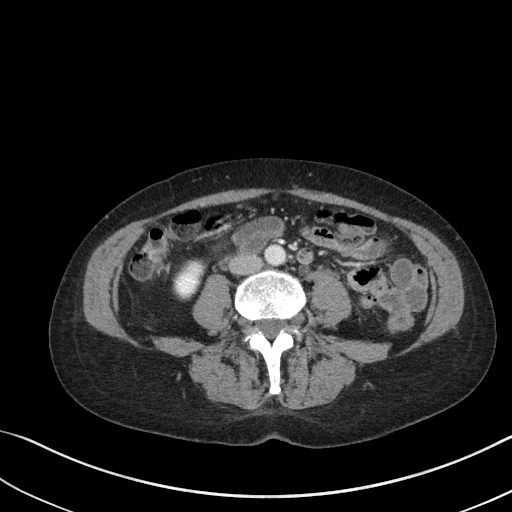
[im 12/143  bone]
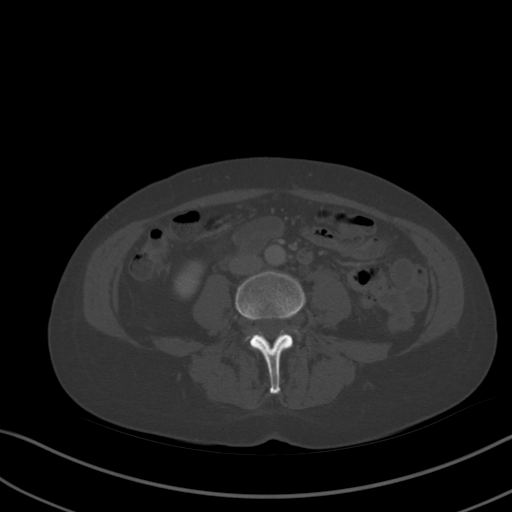
[im 24/143  soft-tissue]
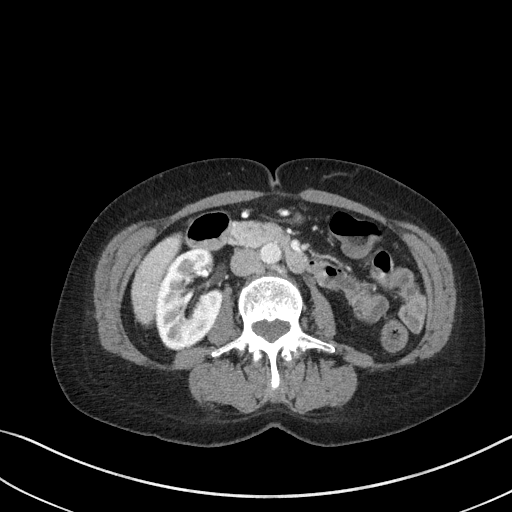
[im 36/143  soft-tissue]
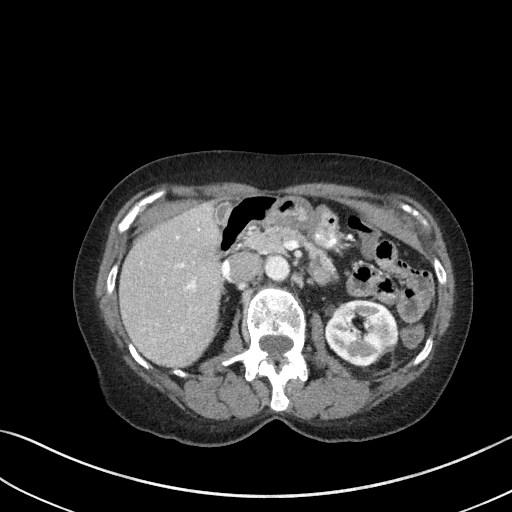
[im 48/143  soft-tissue]
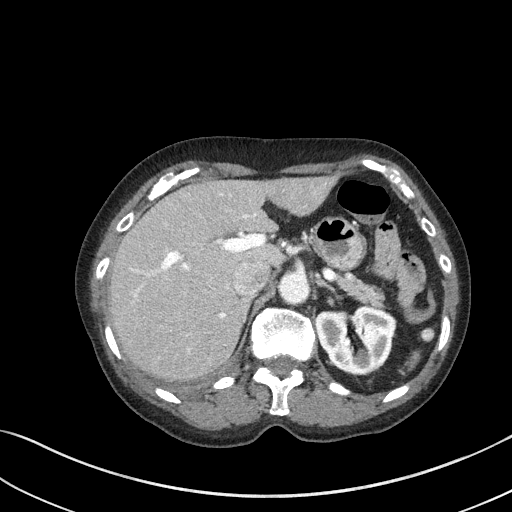
[im 60/143  soft-tissue]
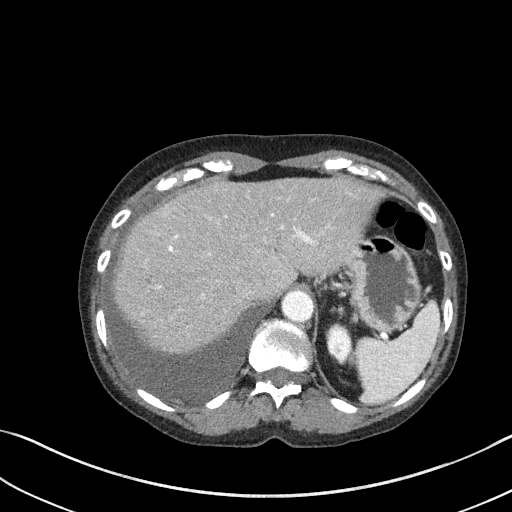
[im 72/143  soft-tissue]
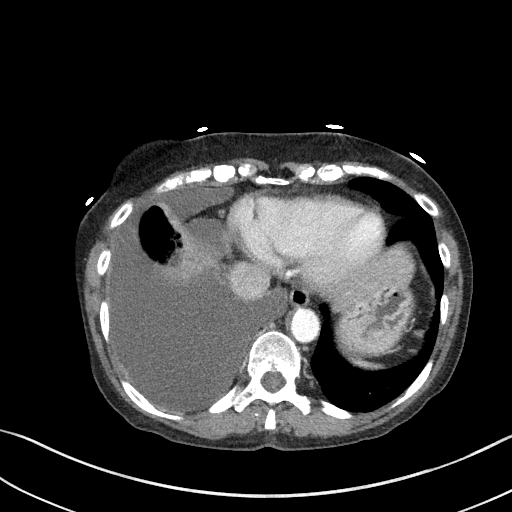
[im 83/143  soft-tissue]
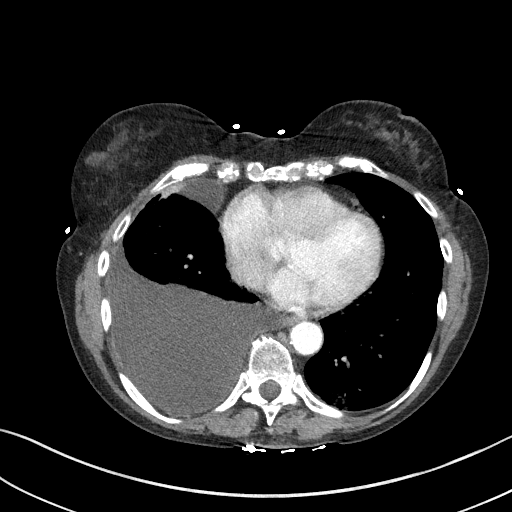
[im 95/143  soft-tissue]
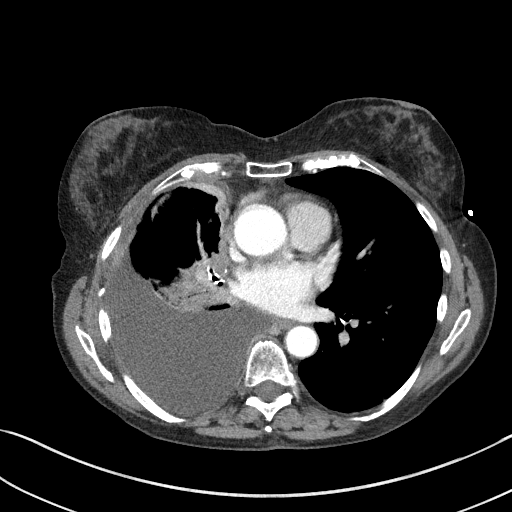
[im 107/143  soft-tissue]
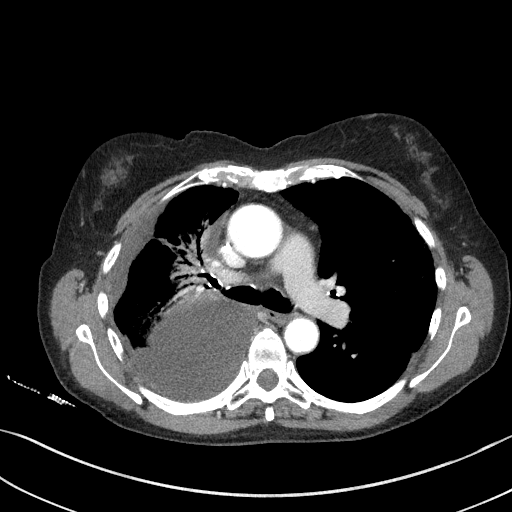
[im 107/143  bone]
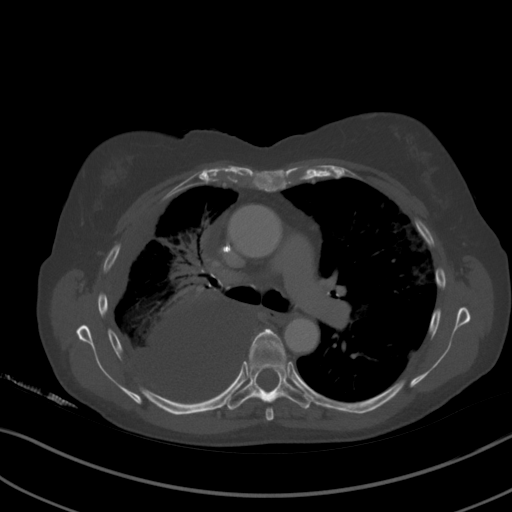
[im 119/143  soft-tissue]
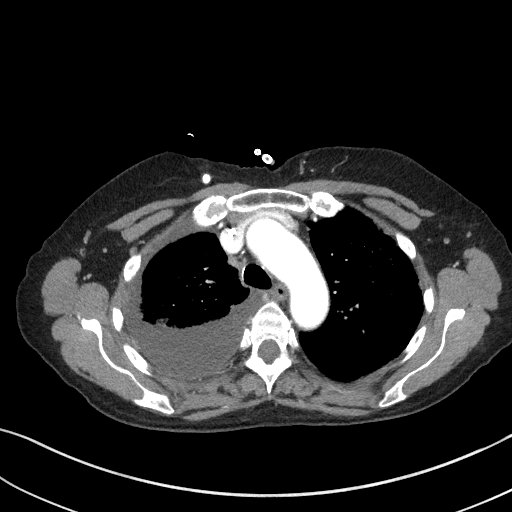
[im 131/143  soft-tissue]
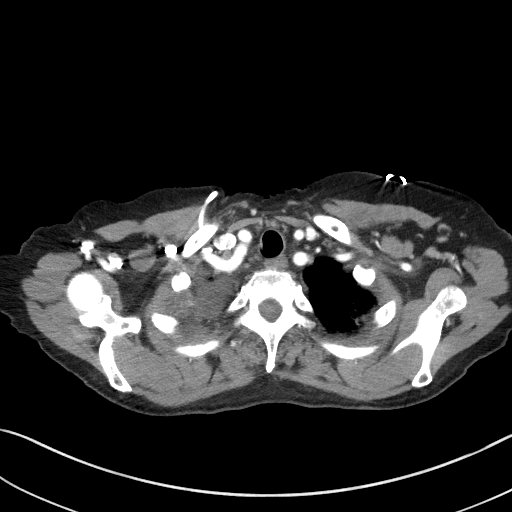

[Series 506: coronal arterial · coronal · arterial · 0.71mm/px · 3 of 77 slices shown]
[im 26/77  soft-tissue]
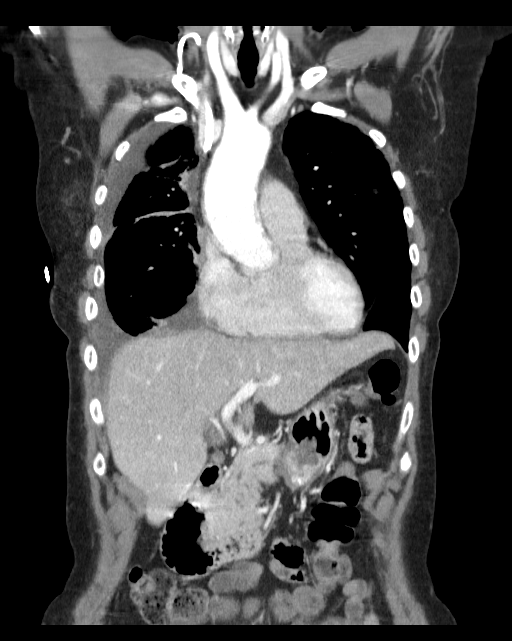
[im 34/77  soft-tissue]
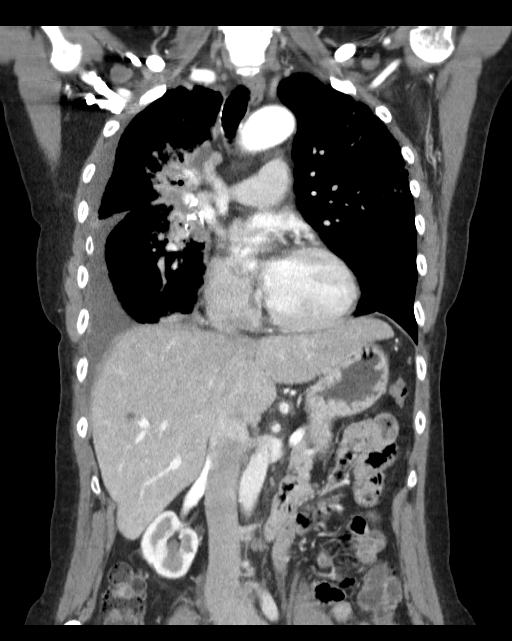
[im 43/77  soft-tissue]
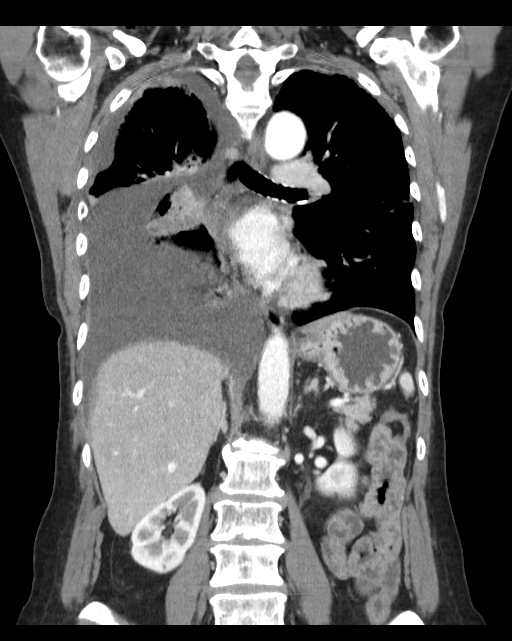

[14 of 46 positions shown; findings below may reference images not displayed]

FINDINGS: CT CHEST FINDINGS

Cardiovascular: The heart is normal in size. No pericardial
effusion.

No evidence of thoracic aortic aneurysm. Mild atherosclerotic
calcifications of the aortic arch.

Right chest port terminates in the lower SVC.

Mediastinum/Nodes: No suspicious mediastinal lymphadenopathy.

Visualized thyroid is unremarkable.

Lungs/Pleura: Radiation changes in the medial right upper
lobe/suprahilar region.

Patchy subpleural ground-glass opacities, left lung predominant,
suggesting atypical infection/pneumonia.

Large right pleural effusion, mildly progressive. Mild irregular
pleural thickening (axial image 79), correlate for pleural-based
metastases.

No pneumothorax.

Musculoskeletal: Mild degenerative changes of the mid thoracic
spine.

CT ABDOMEN AND PELVIS FINDINGS

Hepatobiliary: Subcentimeter cyst in the anterior right hepatic lobe
(axial image 94).

Gallbladder is unremarkable. No intrahepatic or extrahepatic ductal
dilatation.

Pancreas: Within normal limits.

Spleen: Within normal limits.

Adrenals/Urinary Tract: Adrenal glands are within normal limits.

Status post left renal cryoablation in the posterior interpolar
region (series 11/image 49). No associated arterial enhancement to
suggest viable tumor.

Scattered subcentimeter cysts bilaterally. No hydronephrosis.

Bladder is within normal limits.

Stomach/Bowel: Stomach is within normal limits.

No evidence of bowel obstruction.

Normal appendix (series 11/image 108).

Vascular/Lymphatic: No evidence of abdominal aortic aneurysm.

Mild atherosclerotic calcifications the abdominal aorta.

No suspicious abdominopelvic lymphadenopathy.

Reproductive: Status post hysterectomy.

Left bilateral ovaries are unremarkable.

Other: No abdominopelvic ascites.

Musculoskeletal: Mild degenerative changes of the lumbar spine.
IMPRESSION: Radiation changes in the right upper lobe/suprahilar region. Large
right pleural effusion, mildly progressive. Mild irregular right
pleural nodularity, correlate for pleural-based metastases.

Status post left renal cryoablation. No enhancement to suggest
residual/viable tumor.

Patchy subpleural ground-glass opacities, left lung predominant,
suggesting atypical infection/pneumonia.

## 2020-07-16 ENCOUNTER — Encounter (INDEPENDENT_AMBULATORY_CARE_PROVIDER_SITE_OTHER): Payer: Self-pay | Admitting: Ophthalmology

## 2020-07-16 ENCOUNTER — Ambulatory Visit (HOSPITAL_BASED_OUTPATIENT_CLINIC_OR_DEPARTMENT_OTHER)
Admission: RE | Admit: 2020-07-16 | Discharge: 2020-07-16 | Disposition: A | Discharge status: Home / Self Care | Payer: Medicare Other | Source: Ambulatory Visit

## 2020-07-16 ENCOUNTER — Other Ambulatory Visit: Payer: Self-pay

## 2020-07-16 ENCOUNTER — Ambulatory Visit
Payer: Medicare Other | Attending: Student in an Organized Health Care Education/Training Program | Admitting: Ophthalmology

## 2020-07-16 DIAGNOSIS — H47323 Drusen of optic disc, bilateral: Secondary | ICD-10-CM | POA: Insufficient documentation

## 2020-07-16 DIAGNOSIS — H40003 Preglaucoma, unspecified, bilateral: Secondary | ICD-10-CM

## 2020-07-16 DIAGNOSIS — H35372 Puckering of macula, left eye: Secondary | ICD-10-CM

## 2020-07-16 DIAGNOSIS — H25013 Cortical age-related cataract, bilateral: Secondary | ICD-10-CM | POA: Insufficient documentation

## 2020-07-16 NOTE — Progress Notes (Cosign Needed Addendum)
[facility]  [place] [address]  Operated by [place]         Patient Name: Carrie Schmidt  MRN#: [id number]  Birthdate: 05/23/1951    Date of Service: 07/16/2020    Chief Complaint     Carrie Schmidt is a 70 y.o. female who presents today for evaluation/consultation of:  HPI     Cataract     In both eyes.  Associated symptoms include blurred vision, glare, haloes and a need for brighter lights.  Severity is moderate.  Onset was gradual.  Frequency is constant.  Context:  distance vision, mid-range vision, near vision, reading, night driving, driving, dim lighting and watching TV.  Since onset it is gradually worsening.  Affected activities include reading, driving, night driving, watching TV, daily activities, looking at your watch, cell phone use and using a tablet.  Treatments tried include glasses.  Response to treatment was no improvement.              Comments     Referred by Dr Carrolyn Meiers for cataract and glaucoma eval  Per Dr Geoffry Paradise has ERM OU   Has not been diagnosed with glaucoma  No known family HX of glaucoma  Vision OD is better than OS, OS vision is like looking through wax paper  Has been keeping OS closed a lot due to vision OS being so foggy  Uses AT's every morning and PRN           Last edited by Baltazar Apo, [place] on 07/16/2020  9:06 AM. (History)        ROS     Positive for: Cardiovascular, Eyes (cataract and glaucoma eval)    Negative for: Constitutional, Gastrointestinal, Neurological, Skin, Genitourinary, Musculoskeletal, HENT, Endocrine, Respiratory, Psychiatric, Allergic/Imm, Heme/Lymph    Last edited by Baltazar Apo, COA on 07/16/2020  9:06 AM. (History)         All other systems Negative    Melissa Knotts, COA  07/16/2020, 09:13      Base Eye Exam     Visual Acuity (Snellen - Linear)       Right Left    Dist cc 20/25 +1 20/25 +1    Correction: Glasses          Tonometry (Tonopen, 9:13 AM)       Right Left    Pressure 9  10          Gonioscopy ([place] four mirror)       Right Left    Temporal D69f2+ptm D382f+ptm    Nasal D3020fptm D30f35ftm    Superior D30f 55fm D30f 272f    Inferior D30f 2+60fD30f 2+p45f        Pupils       Pupils Shape React APD    Right PERRL Round Brisk None    Left PERRL Round Brisk None          Visual Fields (Counting fingers)       Right Left     Full Full          Extraocular Movement       Right Left     Full Full          Neuro/Psych     Oriented x3: Yes    Mood/Affect: Normal          Dilation  Both eyes: 1.0% Mydriacyl, 2.5% Phenylephrine @ 10:29 AM            Additional Tests     Glare Testing (Transilluminator)       Off    Right 20/25    Left 20/30            Slit Lamp and Fundus Exam     External Exam       Right Left    External Normal Normal          Slit Lamp Exam       Right Left    Lids/Lashes Dermatochalasis - upper lid, 5x63m margin lesion RUL and 4x259mmedial RUL Dermatochalasis - upper lid    Conjunctiva/Sclera White and quiet White and quiet    Cornea Clear Clear    Anterior Chamber Deep and quiet Deep and quiet    Iris Round and reactive Round and reactive    Lens 1-2+ CC, not in [place] cortical spokes entering VA    Vitreous Normal Normal          Fundus Exam       Right Left    Disc full, elevated, multiple drusen, no edema or heme full, elevated, multiple drusen, no edema or heme    C/D Ratio 0.0 0.0    Macula Normal ERM    Vessels Normal Normal    Periphery Normal Normal            Refraction     Wearing Rx       Sphere Cylinder Axis    Right -2.25 +0.50 170    Left -1.75 +0.25 060                MD Addition to HPI: Patient presents for cataract and glaucoma eval, referred by Dr. HuGeoffry ParadisePer Hunt has ERM OU. Has not been diagnosed with glaucoma, no FHx. Vision OD>OS, OS like looking through wax paper.           ENCOUNTER DIAGNOSES     ICD-10-CM   1. Epiretinal membrane (ERM) of left eye  H35.372   2. 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 Glaucoma suspect of both eyes   H40.003   4. Cortical age-related cataract of both eyes  H25.013     Orders Placed This Encounter   Procedures   . OPH RNFL BI   . OPH OCT BI       Ophthalmic Plan of Care:    1. Optic Nerve Drusen OU  - OCT RNFL OU (07/16/20)   OD: ONL sup, inf, nasal,  gcal sl thin sup,  large optic nerve head drusen   OS: onl sup and inf, bl nasal, large optic nerve head druse, erm on gca  - difficult to determine any glaucomatous damage d/t drusen  - IOP 9/10 - excellent IOP  - given excellent iop and no secondary signes of glaucoma likely low risk for glaucoma, gonio open ou    2. CC OS>>OD  - BCVA 20/25 OU  - glares to 20/25 and 20/30  - pt c/o difficulty driving at night time, reading, watching TV. No improvement with glasses  - borderline VS od, os is limiting quality of life signficantly, not traveling at night due to safety concerns with vision on left,  Pt would like to proceed    3. ERM OS  - OCT Mac OU (07/16/20)   OD: compact   OS: erm  - monitor  Visually Signficant cataract, left eye(s)     The patient's impairment of visual function is believed not to be correctable with a tolerable change in glasses or contact lenses.     Cataract (in the operative eye) is believed to be significantly contributing to the patient's visual impairment.      Risks and benefits of cataract surgery reviewed with the patient including the risk of vision loss from infection or intraocular bleeding.  The increased risk of retinal detachment following cataract surgery was discussed.  Patient understands the risks and benefits and is agreeable to proceed.     There is a reasonable expectation that lens surgery will significantly improve both the visual and functional status of the patient.  The patient desires surgical correction. Discussed premium IOL options including Toric and presbyopic lens. Patient prefers Standard Lens      Reviewed IOL Master     Good Quality Study    IOL Target: [place]   on the left eye     Follow up:    Follow up  will be with Dr. Juliane Lack       Follow up:    I have asked ANGELYS YETMAN to follow up POV          Katheran Awe, MD  07/21/2020, 13:52      I have seen and examined the above patient. I discussed the above diagnoses listed in the assessment and the above ophthalmic plan of care with the patient and patient's family. All questions were answered. I reviewed and, when necessary, made changes to the technician/resident note, documented ophthalmology exam, chief complaint, history of present illness, allergies, review of systems, past medical, past surgical, family and social history. I personally reviewed and interpreted all testing and/or imaging performed at this visit and agree with the resident's or fellow's interpretation. Any exceptions/additions are edited/noted in the relevant encounter fields.      Romona Curls, [place]  07/16/2020, 10:28    I am scribing for, and in the presence of, Dr. Juliane Lack for services provided on 07/16/2020.  Romona Curls, SCRIBE   [place], [place]  07/16/2020, 10:28    SURGERY    Procedure: CE IOL OS    Diagnosis:     ICD-10-CM    1. Epiretinal membrane (ERM) of left eye  H35.372 OPH OCT BI   2. Glaucoma suspect, bilateral  H40.003 OPH RNFL BI   3. Cortical age-related cataract of both eyes  H25.013        Anesthesia: MAC TOPICAL    Length of time: 30 minutes    Special Needs:  Trypan for cortical spokes    Romona Curls, SCRIBE 07/16/2020, 10:41    I have reviewed and confirmed the ROS, PFSH, and exam elements performed and documented by the technician. The scribed portion of the progress note was scribed on my behalf and at my direction. I have reviewed and attest to the accuracy of the note.   Aileen Pilot, MD 07/16/2020, 10:50    I saw and examined the patient.  I reviewed the resident's or fellow's note.  I agree with the findings and plan of care as documented in the resident's or fellow's note.  I personally reviewed and interpreted all testing and/or imaging  performed at this visit and agree with the resident's or fellow's interpretation. Any exceptions/additions are edited/noted.     Aileen Pilot, MD 07/16/2020, 10:50

## 2020-07-27 ENCOUNTER — Other Ambulatory Visit: Payer: Self-pay | Admitting: Interventional Radiology

## 2020-07-27 ENCOUNTER — Inpatient Hospital Stay: Payer: Medicare Other

## 2020-07-27 DIAGNOSIS — N2889 Other specified disorders of kidney and ureter: Secondary | ICD-10-CM

## 2020-07-27 DIAGNOSIS — C3490 Malignant neoplasm of unspecified part of unspecified bronchus or lung: Secondary | ICD-10-CM

## 2020-07-28 ENCOUNTER — Inpatient Hospital Stay (HOSPITAL_COMMUNITY)
Admission: RE | Admit: 2020-07-28 | Discharge: 2020-07-28 | Disposition: A | Discharge status: Home / Self Care | Payer: Medicare Other | Source: Ambulatory Visit

## 2020-07-28 ENCOUNTER — Other Ambulatory Visit: Payer: Self-pay

## 2020-07-28 ENCOUNTER — Encounter (HOSPITAL_COMMUNITY): Payer: Self-pay

## 2020-07-28 HISTORY — DX: Cardiac murmur, unspecified: R01.1

## 2020-07-28 HISTORY — DX: Presence of spectacles and contact lenses: Z97.3

## 2020-07-28 HISTORY — DX: Hyperlipidemia, unspecified: E78.5

## 2020-07-29 ENCOUNTER — Other Ambulatory Visit: Payer: Self-pay | Admitting: Hematology & Oncology

## 2020-07-29 ENCOUNTER — Other Ambulatory Visit (INDEPENDENT_AMBULATORY_CARE_PROVIDER_SITE_OTHER): Payer: Self-pay | Admitting: Ophthalmology

## 2020-07-29 ENCOUNTER — Ambulatory Visit
Admission: RE | Admit: 2020-07-29 | Discharge: 2020-07-29 | Disposition: A | Discharge status: Home / Self Care | Payer: Medicare Other | Source: Ambulatory Visit | Attending: Ophthalmology | Admitting: Ophthalmology

## 2020-07-29 DIAGNOSIS — H25013 Cortical age-related cataract, bilateral: Secondary | ICD-10-CM

## 2020-08-04 MED ORDER — CEFUROXIME 1 MG/0.1 ML OPHTH INJ
1.0000 mg | INJECTION | Freq: Once | INTRACAMERAL | Status: DC | PRN
Start: 2020-08-05 — End: 2020-08-05
  Administered 2020-08-05: 1 mg via INTRAMUSCULAR
  Filled 2020-08-04: qty 0.1

## 2020-08-05 ENCOUNTER — Ambulatory Visit (HOSPITAL_COMMUNITY): Payer: Medicare Other | Admitting: ANESTHESIOLOGY

## 2020-08-05 ENCOUNTER — Encounter (HOSPITAL_COMMUNITY)
Admission: RE | Disposition: A | Discharge status: Home / Self Care | Payer: Self-pay | Source: Ambulatory Visit | Attending: Ophthalmology

## 2020-08-05 ENCOUNTER — Inpatient Hospital Stay
Admission: RE | Admit: 2020-08-05 | Discharge: 2020-08-05 | Disposition: A | Discharge status: Home / Self Care | Payer: Medicare Other | Source: Ambulatory Visit | Attending: Ophthalmology | Admitting: Ophthalmology

## 2020-08-05 ENCOUNTER — Encounter (HOSPITAL_COMMUNITY): Payer: Self-pay | Admitting: Ophthalmology

## 2020-08-05 ENCOUNTER — Ambulatory Visit (HOSPITAL_BASED_OUTPATIENT_CLINIC_OR_DEPARTMENT_OTHER): Payer: Medicare Other | Admitting: ANESTHESIOLOGY

## 2020-08-05 ENCOUNTER — Encounter (HOSPITAL_COMMUNITY): Payer: Medicare Other | Admitting: Ophthalmology

## 2020-08-05 ENCOUNTER — Other Ambulatory Visit: Payer: Self-pay

## 2020-08-05 DIAGNOSIS — H25812 Combined forms of age-related cataract, left eye: Secondary | ICD-10-CM | POA: Insufficient documentation

## 2020-08-05 HISTORY — PX: HX CATARACT REMOVAL: SHX102

## 2020-08-05 SURGERY — EXTRACTION CATARACT COMPLEX
Anesthesia: Monitor Anesthesia Care | Site: Eye | Laterality: Left | Wound class: Clean Wound: Uninfected operative wounds in which no inflammation occurred

## 2020-08-05 MED ORDER — LACTATED RINGERS INTRAVENOUS SOLUTION
INTRAVENOUS | Status: DC
Start: 2020-08-05 — End: 2020-08-05
  Administered 2020-08-05: 0 mL via INTRAVENOUS

## 2020-08-05 MED ORDER — LIDOCAINE (PF) 10 MG/ML (1 %) INJECTION SOLUTION
2.0000 mL | Freq: Once | INTRAMUSCULAR | Status: DC | PRN
Start: 2020-08-05 — End: 2020-08-05

## 2020-08-05 MED ORDER — EPINEPHRINE HCL (PF) 1 MG/ML (1 ML) INJECTION SOLUTION
Freq: Once | INTRAOCULAR | Status: DC | PRN
Start: 2020-08-05 — End: 2020-08-05

## 2020-08-05 MED ORDER — MIDAZOLAM (PF) 1 MG/ML INJECTION SOLUTION
Freq: Once | INTRAMUSCULAR | Status: DC | PRN
Start: 2020-08-05 — End: 2020-08-05
  Administered 2020-08-05 (×2): 1 mg via INTRAVENOUS

## 2020-08-05 MED ORDER — CYCLOPENT 1 %-TROPICAMIDE 1 %-PROPARACAINE 0.1 %-PE-KETOROLAC EYE DROP
1.0000 [drp] | OPHTHALMIC | Status: DC
Start: 2020-08-05 — End: 2020-08-05
  Administered 2020-08-05: 1 [drp] via OPHTHALMIC
  Filled 2020-08-05: qty 1

## 2020-08-05 MED ORDER — SODIUM CHLORIDE 0.9 % (FLUSH) INJECTION SYRINGE
2.0000 mL | INJECTION | INTRAMUSCULAR | Status: DC | PRN
Start: 2020-08-05 — End: 2020-08-05

## 2020-08-05 MED ORDER — LIDOCAINE (PF) 3.5 % EYE GEL
2.0000 [drp] | Freq: Once | OPHTHALMIC | Status: DC | PRN
Start: 2020-08-05 — End: 2020-08-05
  Administered 2020-08-05: 10:00:00 2 [drp] via OPHTHALMIC

## 2020-08-05 MED ORDER — TRYPAN BLUE 0.06 % INTRAOCULAR SYRINGE
0.5000 mL | INJECTION | Freq: Once | INTRAOCULAR | Status: DC | PRN
Start: 2020-08-05 — End: 2020-08-05
  Administered 2020-08-05: 0.5 mL via INTRAOCULAR

## 2020-08-05 MED ORDER — SODIUM CHLORIDE 0.9 % (FLUSH) INJECTION SYRINGE
2.0000 mL | INJECTION | Freq: Three times a day (TID) | INTRAMUSCULAR | Status: DC
Start: 2020-08-05 — End: 2020-08-05

## 2020-08-05 MED ORDER — BALANCED SALT SOLUTION COMBINATION NO.2 INTRAOCULAR IRRIGATION
15.0000 mL | Freq: Once | INTRAOCULAR | Status: DC | PRN
Start: 2020-08-05 — End: 2020-08-05
  Administered 2020-08-05: 15 mL via OPHTHALMIC

## 2020-08-05 MED ORDER — MIDAZOLAM 1 MG/ML INJECTION SOLUTION
INTRAMUSCULAR | Status: AC
Start: 2020-08-05 — End: 2020-08-05
  Filled 2020-08-05: qty 2

## 2020-08-05 MED ORDER — TETRACAINE HCL (PF) 0.5 % EYE DROPS
1.0000 [drp] | Freq: Once | OPHTHALMIC | Status: DC | PRN
Start: 2020-08-05 — End: 2020-08-05
  Administered 2020-08-05: 10:00:00 1 [drp] via OPHTHALMIC

## 2020-08-05 MED ORDER — LIDOCAINE 1 %-PHENYLEPHRIN 1.5 % IN BALANCED SALT(PF) INTRAOCULAR SOLN
1.0000 mL | INJECTION | Freq: Once | INTRAOCULAR | Status: AC
Start: 2020-08-05 — End: 2020-08-05
  Administered 2020-08-05 (×2): 1 mL via OPHTHALMIC

## 2020-08-05 MED ORDER — MOXIFLOXACIN 0.5 % EYE DROPS
1.0000 [drp] | OPHTHALMIC | Status: DC
Start: 2020-08-05 — End: 2020-08-05
  Administered 2020-08-05: 1 [drp] via OPHTHALMIC
  Filled 2020-08-05: qty 3

## 2020-08-05 SURGICAL SUPPLY — 23 items
CANNULA ANT CHMBR 27G X 7/8IN 585157 10EA/BX (CANNULA) ×1
CANNULA OPTH 25GA 7MM FLAT TIP ANG HDSCT STRL DISP (CANNULA) ×1 IMPLANT
CANNULA OPTH 25GA 7MM FLT TIP ANG HDSCT STRL DISP (CANNULA) ×1
CANNULA OPTH GRY 7/8IN 27GA VSTC 45D RND CORT HDSCT CLEAVE 11MM STRL LF (CANNULA) ×1 IMPLANT
CYSTO OPTH 12MM 25GA ANG STRL DISP (OPHTHALMIC SUPPLIES (NOT LENS)) ×1 IMPLANT
CYSTO OPTH 12MM 25GA ANG STRL DISP (OPTHALMIC SUPPLIES (NOT LENS)) ×1
DISCONTINUED USE  318844 - LENS IOL 0 D +17.5 DIOP MOD L PLANAR ACRYSOF STABLEFORCE ULTRASERT TENSIONGLIDE ASPH UV BLU LIGHT ×1 IMPLANT
DRAPE BILAMINATE UTIL TAPE 26X 15IN LF DISP SURG (EQUIPMENT MINOR) ×1
DUPE USE ITEM 319409 - DRAPE BILAMINATE UTIL TAPE 26X_15IN LF DISP SURG (EQUIPMENT MINOR) ×1 IMPLANT
EXPANDER OPTH 7MM MALYUGIN RIN G PUPIL DISP INJECTOR STRL (OPTHALMIC SUPPLIES (NOT LENS)) ×1
EXPANDER OPTH 7MM MALYUGIN RING PUPIL DISP INJECTOR STRL (OPHTHALMIC SUPPLIES (NOT LENS)) ×1 IMPLANT
GOWN SURG 2XL AAMI L3 REINF HK_LP CLSR SET IN SLEEVE STRL LF (PROTECTIVE PRODUCTS/GARMENTS) ×1
GOWN SURG 2XL L3 REINF HKLP CLSR SET IN SLEEVE STRL LF  DISP BLU SIRUS SMS 49IN (PROTECTIVE PRODUCTS/GARMENTS) ×1 IMPLANT
PACK CATARACT BX/6 (TRAY) ×1 IMPLANT
PACK CATARACT BX/6 AS1016621 (TRAY) ×1
PACK SOLUTION COMBO MPI006102 6EA/CS (SOLUTIONS) ×1
PACK SOLUTION COMBO_MPI006102 6EA/CS (SOLUTIONS) ×1 IMPLANT
SHIELD EYE 76.2X60.3MM ASST AL EY GRTR FOX CVR (OPHTHALMIC SUPPLIES (NOT LENS)) ×1 IMPLANT
SHIELD EYE 76.2X60.3MM ASST AL_EY GRTR FOX CVR (OPTHALMIC SUPPLIES (NOT LENS)) ×1
TIP SUCT/IRRG .03MM INTREPID 35D BNT PLMR DISP (OPHTHALMIC SUPPLIES (NOT LENS)) IMPLANT
TIP SUCT/IRRG 35DEG POLYMER 8065751511 6EA/BX (OPTHALMIC SUPPLIES (NOT LENS))
WIPE 3X3IN LF  ULTRACELL SURG INSTR SOLAN THK2MM (CLEN) ×1 IMPLANT
WIPE 3X3IN LF ULTRACELL SURG_INSTR SOLAN THK2MM (CLEN) ×1

## 2020-08-05 NOTE — H&P (Signed)
[facility]       Pre-Surgical History and Physical Report    Patient:  Carrie Schmidt  Med Record # [id number]  Date of Birth:   05-Jan-1951  Age:  70 y.o.   Gender: female    Date of Admission: 08/05/2020    Clinical history:  This patient is presenting for the ophthalmic procedure as described in the accompanying ophthalmic medical record    Past medical and surgical history: were reviewed and are noted in the accompanying EMR    ROS: was reviewed and is noted in the electronic medical record    Medications:were reviewed and are  noted in the electronic medical record     Physical Examination:  Filed Vitals:    08/05/20 0825 08/05/20 1014 08/05/20 1046   BP: (!) 142/81 111/82 118/68   Pulse: 73 78 70   Resp: 18  17   Temp: 36.2 C (97.2 F) 36.2 C (97.2 F) 36.3 C (97.3 F)   SpO2: 97% 94% 96%           Vital Signs: were reviewed and are noted in the accompanying medical record       HEENT: See complete eye examination findings in the accompanying medical record      Heart:  Regular rate and rhythm      Lungs:  Clear to auscultation bilaterally      Abdomen:  + Bowel sounds, soft, nontender      Neuro:  Grossly normal    Comments: Patient is medically stable for the planned ophthalmic procedure. Site of surgery was clearly indicated for this patient.     Lucilla Edin, MD 08/05/2020, 09:33      I saw and examined the patient.  I reviewed the resident's or fellow's note.  I agree with the findings and plan of care as documented in the resident's or fellow's note.  I personally reviewed and interpreted all testing and/or imaging performed at this visit and agree with the resident's or fellow's interpretation. Any exceptions/additions are edited/noted.     Aileen Pilot, MD 08/05/2020, 16:10

## 2020-08-05 NOTE — OR Surgeon (Signed)
[place] [facility]   OPERATION SUMMARY   PATIENT NAME: Carrie Schmidt NUMBER: [id number]   DATE OF SERVICE: 08/05/2020   DATE OF BIRTH: [birth year]    PREOPERATIVE DIAGNOSIS:   Cataract, Left eye. Combined  H25.812  POSTOPERATIVE DIAGNOSIS:   Cataract, Left eye.   NAME OF PROCEDURE: Phacoemulsification of cataract with posterior chamber lens implant, Left eye. Complex Cataract Removal with Implant  CPT [phone]  SURGEONS: Aileen Pilot, MD  ASSISTANT: Lucilla Edin MD (Resident)    ANESTHESIA: MAC, topical.   ESTIMATED BLOOD LOSS: Less than 1 drop.   COMPLICATIONS: There were no complications.   SPECIMENS: None.     INDICATIONS FOR PROCEDURE AND CONSENT:  The patient has decreased vision in the left eye down due to a cataract and had difficulty reading due to the poor vision in this eye.  I discussed with the patient the possible risks and complications of surgery in layman's terms including, but not limited to, chance of loss of vision, total blindness, or even loss of the eye due to endophthalmitis or choroidal hemorrhage.  I also explained the risk of retinal detachment, retinal tear, cystoid macular edema, dislocation of the intraocular lens, distortion of the pupil, wound dehiscence, eyelid ptosis, monocular or binocular double vision, corneal decompensation requiring corneal transplantation, glaucoma, and also the need for additional surgery procedures, and even death.  The patient understood all this.  All questions were answered.  No guarantee of success for the outcome was given to the patient.  The patient gave informed consent requesting that we  proceed with surgery.    DESCRIPTION OF PROCEDURE: In the preanesthesia area, the patient was given a topical anesthetic consisting of 2% Xylocaine jelly applied to the left eye.  The patient was taken back to the operating room suite.  A surgical Time-Out confirmed the correct patient, procedure, and operative eye.   The eye was then prepped and draped in the usual sterile fashion. A speculum was placed in the eye. A disposable supersharp blade was used to make a 1-mm side port entry through clear cornea along the limbus, and 1 mL of 1% preservative-free Xylocaine was irrigated into the anterior chamber. Trypan blue was used to stain the anterior capsule. Additional xylocain was used to remove the trypan blue. The anterior chamber was filled with viscoelastic material. The keratome was used to make a 2.4-mm phaco port incision through clear cornea temporally along the limbus. A 7.21m malyugan ring was used to expand the pupil. The cystotome and capsulorrhexis forceps were used to create a continuous curvilinear capsulorrhexis. Hydrodissection was performed with balanced salt solution on a cannula. The tip of the phacoemulsification handpiece was introduced into the eye, and the lens nucleus was emulsified using a bimanual nuclear splitting technique.  The phaco tip was removed and the tip of the I/A handpiece introduced into the eye. The remaining lens cortical material was aspirated from the eye. The I/A tip was removed, and the capsular bag was inflated with viscoelastic material. The posterior chamber lens implant of +17.5 diopters power was brought onto the field and inspected. It was without defect and was loaded into the lens injector. The tip of the injector was inserted into the eye, and the lens implant was placed in the capsular bag. The injector tip was removed. The Malygan ring was removed. The tip of the I/A handpiece reintroduced into the eye. The remaining viscoelastic material was aspirated. The I/A tip  was removed, and the anterior chamber was pressurized with balanced salt solution. The anterior wound margins were hydrated with balanced salt solution and 32m of Cefuroxime was instilled into the anterior chamber.  Weck-cel sponges were gently brushed against the incision and confirmed that there was no wound  leakage.  The speculum was removed from the eye, and the eye was bandaged with a rigid shield. The patient was returned to same day surgery in excellent condition.    JKatheran Awe MD 08/05/2020, 10:06    [facility]   Implant Name Type Inv. Item Serial No. Manufacturer Lot No. LRB No. Used Action   LENS IOL 0 D +17.5 DIOP MOD L PLANAR ACRYSOF STABLEFORCE - [id number] LENS IOL 0 D +17.5 DIOP MOD L PLANAR ACRYSOF [employer]ABLE[employer]CE [id number] N/A Left 1 Implanted       [facility]    PATIENT NAME:  KViccoNAME:  [id number] DATE OF SERVICE:  08/05/2020  DATE OF BIRTH:  [birth year]   Patient meets discharge criteria.  Discharge to home  Keep shield in place.  RTC as scheduled or sooner prn any problem.     JKatheran Awe MD 08/05/2020, 10:06    I saw and examined the patient.  I reviewed the resident's or fellow's note.  I agree with the findings and plan of care as documented in the resident's or fellow's note.  I personally reviewed and interpreted all testing and/or imaging performed at this visit and agree with the resident's or fellow's interpretation. Any exceptions/additions are edited/noted.     BAileen Pilot MD 08/05/2020, 16:10  I was present for all key and/or critical portions of the case and immediately available at all times.      BAileen Pilot MD 08/05/2020, 16:10

## 2020-08-05 NOTE — Anesthesia Transfer of Care (Signed)
ANESTHESIA TRANSFER OF Carrie Schmidt is a 70 y.o. ,female, Weight: 79.3 kg (174 lb 13.2 oz)   had Procedure(s):  EXTRACTION CATARACT  COMPLEX  performed  08/05/20   Primary Service: Aileen Pilot, MD    Past Medical History:   Diagnosis Date    Arthropathy     Cancer ([facility])     parotid gland removed    Heart murmur     Diagnosed young, does not follow with cardiologist.     HTN (hypertension)     Controlled on medications.     Hyperlipidemia     Controlled on medications.     Wears glasses       Allergy History as of 08/05/20     IV CONTRAST       Noted Status Severity Type Reaction    01/20/20 1528 Sallee Lange, COA 01/20/20 Active Low  Hives/ Urticaria              I completed my transfer of care / handoff to the receiving personnel during which we discussed:  Access, Airway, All key/critical aspects of case discussed, Analgesia, Antibiotics, Expectation of post procedure, Fluids/Product, Gave opportunity for questions and acknowledgement of understanding, Labs and PMHx                                                                      Last OR Temp: Temperature: 36.2 C (97.2 F)  ABG:   Airway:* No LDAs found *  Blood pressure 111/82, pulse 78, temperature 36.2 C (97.2 F), resp. rate 18, height 1.524 m (5'), weight 79.3 kg (174 lb 13.2 oz), SpO2 94 %, not currently breastfeeding.

## 2020-08-05 NOTE — Discharge Instructions (Addendum)
SURGICAL DISCHARGE INSTRUCTIONS     Dr. McMillan, Brian, MD  performed your EXTRACTION CATARACT COMPLEX today at the [facility]    [facility]:  Monday through Friday from 6 a.m. - 7 p.m.: [phone]  Between 7 p.m. - 6 a.m., weekends and holidays:  Call [facility] at [phone] or [phone].    PLEASE SEE WRITTEN HANDOUTS AS DISCUSSED BY YOUR NURSE:      SIGNS AND SYMPTOMS OF A WOUND / INCISION INFECTION   Be sure to watch for the following:   Increase in redness or red streaks near or around the wound or incision.   Increase in pain that is intense or severe and cannot be relieved by the pain medication that your doctor has given you.   Increase in swelling that cannot be relieved by elevation of a body part, or by applying ice, if permitted.   Increase in drainage, or if yellow / green in color and smells bad. This could be on a dressing or a cast.   Increase in fever for longer than 24 hours, or an increase that is higher than 101 degrees Fahrenheit (normal body temperature is 98 degrees Fahrenheit). The incision may feel warm to the touch.    **CALL YOUR DOCTOR IF ONE OR MORE OF THESE SIGNS / SYMPTOMS SHOULD OCCUR.    ANESTHESIA INFORMATION   ANESTHESIA -- ADULT PATIENTS:  You have received intravenous sedation / general anesthesia, and you may feel drowsy and light-headed for several hours. You may even experience some forgetfulness of the procedure. DO NOT DRIVE A MOTOR VEHICLE or perform any activity requiring complete alertness or coordination until you feel fully awake in about 24-48 hours. Do not drink alcoholic beverages for at least 24 hours. Do not stay alone, you must have a responsible adult available to be with you. You may also experience a dry mouth or nausea for 24 hours. This is a normal side effect and will disappear as the effects of the medication wear off.    REMEMBER   If you experience any difficulty breathing, chest pain, bleeding that you feel  is excessive, persistent nausea or vomiting or for any other concerns:  Call your physician Dr. McMillian at [phone] or [phone]. You may also ask to have the  doctor on call paged. They are available to you 24 hours a day.    SPECIAL INSTRUCTIONS / COMMENTS       FOLLOW-UP APPOINTMENTS   Please call patient services at [phone] or [phone] to schedule a date / time of return. They are open Monday - Friday from 7:30 am - 5:00 pm.

## 2020-08-05 NOTE — Anesthesia Postprocedure Evaluation (Signed)
Anesthesia Post Op Evaluation    Patient: Carrie Schmidt  Procedure(s):  EXTRACTION CATARACT  COMPLEX    Last Vitals:Temperature: 36.2 C (97.2 F) (08/05/20 1014)  Heart Rate: 78 (08/05/20 1014)  BP (Non-Invasive): 111/82 (08/05/20 1014)  Respiratory Rate: 18 (08/05/20 0825)  SpO2: 94 % (08/05/20 1937)    No complications documented.    Patient is sufficiently recovered from the effects of anesthesia to participate in the evaluation and has returned to their pre-procedure level.  Patient location during evaluation: PACU       Patient participation: complete - patient participated  Level of consciousness: awake and alert and responsive to verbal stimuli    Pain management: adequate  Airway patency: patent    Anesthetic complications: no  Cardiovascular status: acceptable  Respiratory status: acceptable  Hydration status: acceptable  Patient post-procedure temperature: Pt Normothermic   PONV Status: Absent

## 2020-08-05 NOTE — Nurses Notes (Signed)
Patient discharged home with family.  AVS reviewed with patient/care giver.  A written copy of the AVS and discharge instructions was given to the patient/care giver.  Questions sufficiently answered as needed.  Patient/care giver encouraged to follow up with PCP as indicated.  In the event of an emergency, patient/care giver instructed to call 911 or go to the nearest emergency room.

## 2020-08-05 NOTE — Anesthesia Preprocedure Evaluation (Signed)
ANESTHESIA PRE-OP EVALUATION  Planned Procedure: PHACO WITH INTRAOCULAR LENS (Left Eye)  Review of Systems     anesthesia history negative     patient summary reviewed          Pulmonary    no COPD and no sleep apnea   Cardiovascular    Hypertension and ECG reviewed ,No angina,        GI/Hepatic/Renal        Endo/Other    obesity,       Neuro/Psych/MS        Cancer  CA,                    Physical Assessment      Patient summary reviewed   Airway       Mallampati: II    TM distance: >3 FB    Neck ROM: full  Mouth Opening: good.            Dental           (+) partials           Pulmonary      (-) no wheezes     Cardiovascular    Rhythm: regular  Rate: Normal       Other findings            Plan  ASA 2     Planned anesthesia type: MAC                 Intravenous induction     Anesthesia issues/risks discussed are: Sore Throat, Post-op Intubation/Ventilation, PONV, Blood Loss and Cardiac Events/MI.  Anesthetic plan and risks discussed with patient.      Use of blood products discussed with patient who consented to blood products.     Patient's NPO status is appropriate for Anesthesia.           Plan discussed with CRNA.

## 2020-08-06 ENCOUNTER — Encounter (INDEPENDENT_AMBULATORY_CARE_PROVIDER_SITE_OTHER): Payer: Self-pay | Admitting: Ophthalmology

## 2020-08-06 ENCOUNTER — Ambulatory Visit: Payer: Medicare Other | Attending: Ophthalmology | Admitting: Ophthalmology

## 2020-08-06 DIAGNOSIS — Z9889 Other specified postprocedural states: Secondary | ICD-10-CM | POA: Insufficient documentation

## 2020-08-06 NOTE — Progress Notes (Addendum)
OPHTHALMOLOGY, [place]  [place] [address]  Operated by [facility]         Patient Name: Carrie Schmidt  MRN#: [id number]  [place]: 01-18-1951    Date of Service: 08/06/2020    Chief Complaint     Post-op #1          Carrie Schmidt is a 70 y.o. female who presents today for evaluation/consultation of:  HPI     Pt her for POD 1 CE IOL OS 08/05/20  Pt notes she is doing well and gacing no issues  Pt states she wants to remove the left lens from her glasses so she doesn't have diplopia  Pt using pred QID OS, Oflox QID OS, Ketoralac BID Os    Last edited by Javier Glazier, Derrek Gu, COA on 08/06/2020  9:57 AM. (History)        ROS     Positive for: Eyes    Negative for: Constitutional, Gastrointestinal, Neurological, Skin, Genitourinary, Musculoskeletal, HENT, Endocrine, Cardiovascular, Respiratory, Psychiatric, Allergic/Imm, Heme/Lymph    Last edited by Loralee Pacas, COA on 08/06/2020  9:55 AM. (History)         All other systems Negative    Loralee Pacas, COA  08/06/2020, 10:01      Base Eye Exam     Visual Acuity (Snellen - Linear)       Right Left    Dist sc  20/25 -2    Dist cc 20/20 -2     Dist ph sc  20/20 -1          Tonometry (Tonopen, 10:01 AM)       Right Left    Pressure CL 13          Pupils       Pupils APD    Right PERRL None    Left PERRL None          Neuro/Psych     Oriented x3: Yes    Mood/Affect: Normal            Slit Lamp and Fundus Exam     External Exam       Right Left    External Normal Normal          Slit Lamp Exam       Right Left    Lids/Lashes Dermatochalasis - upper lid, 5x54mm margin lesion RUL and 4x63mm medial RUL Dermatochalasis - upper lid    Conjunctiva/Sclera White and quiet White and quiet    Cornea Clear Clear    Anterior Chamber Deep and quiet rare cell    Iris Round and reactive Round and reactive    Lens 1-2+ CC, not in [place] Posterior chamber intraocular lens    Vitreous Normal Normal                MD Addition to HPI: Patient  presents for POD1 s/p CE IOL OS. Notes she is doing well and no issues. Pt wants to remove the left lens from glasses so she doesn't have diplopia. Using pred QID, ofloxacin QID, ketorolac BID OS.           ENCOUNTER DIAGNOSES     ICD-10-CM   1. 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      Ophthalmic Plan of Care:    Post-op Day #1 s/p Phaco/IOL left eye  -  Start ofloxacin, Pred Forte 1%,  4 times daily, left eye  -Ketorolac 2x/day  -Wear protective eye wear during the day and shield at bedtime  -Discussed symptoms of infection and retinal detachment with patient and patient agreed to return to clinic immediately if the aforementioned symptoms were experienced   -Patient instructed not to rub the operative eye  Call if increased pain, redness or decreased vision.      Follow up:    I have asked Carrie Schmidt to follow up 1 week POV          I have seen and examined the above patient. I discussed the above diagnoses listed in the assessment and the above ophthalmic plan of care with the patient and patient's family. All questions were answered. I reviewed and, when necessary, made changes to the technician/resident note, documented ophthalmology exam, chief complaint, history of present illness, allergies, review of systems, past medical, past surgical, family and social history. I personally reviewed and interpreted all testing and/or imaging performed at this visit and agree with the resident's or fellow's interpretation. Any exceptions/additions are edited/noted in the relevant encounter fields.      Romona Curls, [place]  08/06/2020, 10:03    I am scribing for, and in the presence of, Dr. Juliane Lack for services provided on 08/06/2020.  Romona Curls, [place], [place]  08/06/2020, 10:03    I have reviewed and confirmed the ROS, PFSH, and exam elements performed and documented by the technician. The scribed portion of the progress note was  scribed on my behalf and at my direction. I have reviewed and attest to the accuracy of the note.   Aileen Pilot, MD 08/06/2020, 10:08    I saw and examined the patient.  I reviewed the resident's or fellow's note.  I agree with the findings and plan of care as documented in the resident's or fellow's note.  I personally reviewed and interpreted all testing and/or imaging performed at this visit and agree with the resident's or fellow's interpretation. Any exceptions/additions are edited/noted.     Aileen Pilot, MD 08/06/2020, 10:08

## 2020-08-13 ENCOUNTER — Ambulatory Visit: Payer: Medicare Other | Attending: Ophthalmology | Admitting: Ophthalmology

## 2020-08-13 ENCOUNTER — Encounter (INDEPENDENT_AMBULATORY_CARE_PROVIDER_SITE_OTHER): Payer: Self-pay | Admitting: Ophthalmology

## 2020-08-13 ENCOUNTER — Other Ambulatory Visit: Payer: Self-pay

## 2020-08-13 DIAGNOSIS — Z9889 Other specified postprocedural states: Secondary | ICD-10-CM | POA: Insufficient documentation

## 2020-08-13 NOTE — Progress Notes (Addendum)
OPHTHALMOLOGY, [place]  [place] [address]  Operated by [facility]         Patient Name: Carrie Schmidt  MRN#: [id number]  [place]: 08-Jun-1951    Date of Service: 08/13/2020    Chief Complaint     Post-op #2          NATALYE KOTT is a 70 y.o. female who presents today for evaluation/consultation of:  HPI     Pt here for 1 week POV CE IOL OS 08/05/20  Pt notes she is doing well and having no probles  Pt using Pt using pred QID OS, Oflox QID OS, Ketoralac BID OS  Pt notes she will discontinue Oflox today and begin to taper pred    Last edited by Loralee Pacas, COA on 08/13/2020  1:11 PM. (History)        ROS     Positive for: Eyes    Negative for: Constitutional, Gastrointestinal, Neurological, Skin, Genitourinary, Musculoskeletal, HENT, Endocrine, Cardiovascular, Respiratory, Psychiatric, Allergic/Imm, Heme/Lymph    Last edited by Loralee Pacas, [place] on 08/13/2020  1:11 PM. (History)         All other systems Negative    Loralee Pacas, COA  08/13/2020, 13:18        Base Eye Exam     Visual Acuity (Snellen - Linear)       Right Left    Dist sc  20/20 -1    Dist cc 20/20 -2     Correction: Contacts          Tonometry (Tonopen, 1:17 PM)       Right Left    Pressure CL 12          Pupils       Pupils React APD    Right PERRL Brisk None    Left PERRL Brisk None          Visual Fields       Right Left     Full Full          Extraocular Movement       Right Left     Full Full          Neuro/Psych     Oriented x3: Yes    Mood/Affect: Normal            Slit Lamp and Fundus Exam     External Exam       Right Left    External Normal Normal          Slit Lamp Exam       Right Left    Lids/Lashes Dermatochalasis - upper lid, 5x53mm margin lesion RUL and 4x57mm medial RUL Dermatochalasis - upper lid    Conjunctiva/Sclera White and quiet White and quiet    Cornea Clear Clear    Anterior Chamber Deep and quiet Deep and quiet    Iris Round and reactive Round and  reactive    Lens 1-2+ CC, not in [place] Posterior chamber intraocular lens    Vitreous Normal Normal                MD Addition to HPI: POW1 CE IOL OS. Very pleased with vision. No issues. Using pred QID, ofloxacin QID, ketorolac BID OS.           ENCOUNTER DIAGNOSES     ICD-10-CM   1. 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      Ophthalmic Plan of Care:    Post-op Week #1 s/p Phaco/IOL, left eye  -Stop ofloxacin and the use of shield  -Taper Pred Forte 1%  left eye 3-2-1-0 q week.  -Ketorolac 2x/day  Call if increased pain, redness or decreased vision.    Follow up:    I have asked BROCK MOKRY to follow up 1 month POV          I have seen and examined the above patient. I discussed the above diagnoses listed in the assessment and the above ophthalmic plan of care with the patient and patient's family. All questions were answered. I reviewed and, when necessary, made changes to the technician/resident note, documented ophthalmology exam, chief complaint, history of present illness, allergies, review of systems, past medical, past surgical, family and social history. I personally reviewed and interpreted all testing and/or imaging performed at this visit and agree with the resident's or fellow's interpretation. Any exceptions/additions are edited/noted in the relevant encounter fields.      Katheran Awe, MD  08/13/2020, 13:35    I am scribing for, and in the presence of, Dr. Juliane Lack for services provided on 08/13/2020.  Romona Curls, [place], [place]  08/13/2020, 13:40    I have reviewed and confirmed the ROS, PFSH, and exam elements performed and documented by the technician. The scribed portion of the progress note was scribed on my behalf and at my direction. I have reviewed and attest to the accuracy of the note.   Aileen Pilot, MD 08/13/2020, 13:44    I saw and examined the patient.  I reviewed the resident's or fellow's note.  I  agree with the findings and plan of care as documented in the resident's or fellow's note.  I personally reviewed and interpreted all testing and/or imaging performed at this visit and agree with the resident's or fellow's interpretation. Any exceptions/additions are edited/noted.     Aileen Pilot, MD 08/13/2020, 13:44

## 2020-08-17 ENCOUNTER — Other Ambulatory Visit: Payer: Self-pay | Admitting: Interventional Radiology

## 2020-08-17 DIAGNOSIS — N2889 Other specified disorders of kidney and ureter: Secondary | ICD-10-CM

## 2020-09-08 ENCOUNTER — Other Ambulatory Visit: Payer: Self-pay

## 2020-09-10 ENCOUNTER — Other Ambulatory Visit: Payer: Self-pay

## 2020-09-10 ENCOUNTER — Encounter (INDEPENDENT_AMBULATORY_CARE_PROVIDER_SITE_OTHER): Payer: Self-pay | Admitting: Ophthalmology

## 2020-09-10 ENCOUNTER — Ambulatory Visit: Payer: Medicare Other | Attending: Ophthalmology | Admitting: Ophthalmology

## 2020-09-10 DIAGNOSIS — Z9889 Other specified postprocedural states: Secondary | ICD-10-CM | POA: Insufficient documentation

## 2020-09-10 NOTE — Progress Notes (Addendum)
[facility]  [place] [address]  Operated by [facility]         Patient Name: Carrie Schmidt  MRN#: [id number]  Birthdate: [birth year]    Date of Service: 09/10/2020    Chief Complaint     Post-op [place] is a 70 y.o. female who presents today for evaluation/consultation of:  HPI     Pt is here for final POV  S/p CE IOL OS     Patient denies recent changes in vision.   Patient denies flashes of light, new/abnormal floaters, and eye pain.   Patient denies changes in visual field.     Pt follows with Dr. Geoffry Paradise in [place], [place] for CLs Rx.   States she has an exam for new prescriptions coming up in a couple months.     Pt finished Post Op drops as instructed.     Last edited by Denton Lank, COA on 09/10/2020 10:33 AM. (History)        ROS     Positive for: Eyes (POV)    Negative for: Constitutional, Gastrointestinal, Neurological, Skin, Genitourinary, Musculoskeletal, HENT, Endocrine, Cardiovascular, Respiratory, Psychiatric, Allergic/Imm, Heme/Lymph    Last edited by Denton Lank, COA on 09/10/2020 10:30 AM. (History)         All other systems Negative    Deanna Teter, COA  09/10/2020, 10:31        Base Eye Exam     Visual Acuity (Snellen - Linear)       Right Left    Dist sc  20/20    Dist cc 20/20 -1     Correction: Contacts          Tonometry (Tonopen, 10:38 AM)       Right Left    Pressure 19 16   IOP OD over CLs  Deanna Teter, COA  09/10/2020, 10:38             Pupils       Pupils Dark Shape React APD    Right PERRL 3 Round Brisk None    Left PERRL 3 Round Brisk None          Neuro/Psych     Oriented x3: Yes    Mood/Affect: Normal            Slit Lamp and Fundus Exam     External Exam       Right Left    External Normal Normal          Slit Lamp Exam       Right Left    Lids/Lashes Dermatochalasis - upper lid, 5x107mm margin lesion RUL and 4x45mm medial RUL Dermatochalasis - upper lid    Conjunctiva/Sclera White and quiet White and quiet    Cornea  Clear Clear    Anterior Chamber Deep and quiet Deep and quiet    Iris Round and reactive Round and reactive    Lens 1-2+ CC, not in [place] Posterior chamber intraocular lens    Vitreous Normal Normal                MD Addition to HPI: POM1 CE IOL OS. Denies changes in vision. Follows w/ Dr. Geoffry Paradise for CL Rx. Finished PO drops.         ENCOUNTER DIAGNOSES     ICD-10-CM   1. 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    Ophthalmic Plan of Care:    Post-op Month #1 s/p Phaco/IOL, left eye  -Stop ofloxacin and the use of shield  -finished Pred Forte 1%  left eye   Call if increased pain, redness or decreased vision.   will follow up with DR. Hunt in [place] for refraction/CL    Follow up:    I have asked TYRESHA FEDE to follow up PRN          I have seen and examined the above patient. I discussed the above diagnoses listed in the assessment and the above ophthalmic plan of care with the patient and patient's family. All questions were answered. I reviewed and, when necessary, made changes to the technician/resident note, documented ophthalmology exam, chief complaint, history of present illness, allergies, review of systems, past medical, past surgical, family and social history. I personally reviewed and interpreted all testing and/or imaging performed at this visit and agree with the resident's or fellow's interpretation. Any exceptions/additions are edited/noted in the relevant encounter fields.      Katheran Awe, MD  09/10/2020, 11:09    I am scribing for, and in the presence of, Dr. Juliane Lack for services provided on 09/10/2020.  Romona Curls, [place], [place]  09/10/2020, 11:20    I have reviewed and confirmed the ROS, PFSH, and exam elements performed and documented by the technician. The scribed portion of the progress note was scribed on my behalf and at my direction. I have reviewed and attest to the accuracy of the note.   Aileen Pilot,  MD 09/10/2020, 11:23    I saw and examined the patient.  I reviewed the resident's or fellow's note.  I agree with the findings and plan of care as documented in the resident's or fellow's note.  I personally reviewed and interpreted all testing and/or imaging performed at this visit and agree with the resident's or fellow's interpretation. Any exceptions/additions are edited/noted.     Aileen Pilot, MD 09/10/2020, 11:23

## 2020-09-16 ENCOUNTER — Ambulatory Visit (HOSPITAL_COMMUNITY)
Admission: RE | Admit: 2020-09-16 | Discharge: 2020-09-16 | Disposition: A | Discharge status: Home / Self Care | Payer: Medicare Other | Source: Ambulatory Visit

## 2020-09-16 ENCOUNTER — Ambulatory Visit
Admission: RE | Admit: 2020-09-16 | Discharge: 2020-09-16 | Disposition: A | Discharge status: Home / Self Care | Payer: Medicare Other | Source: Ambulatory Visit | Attending: Ophthalmology | Admitting: Ophthalmology

## 2020-09-16 ENCOUNTER — Encounter (INDEPENDENT_AMBULATORY_CARE_PROVIDER_SITE_OTHER): Payer: Self-pay | Admitting: Ophthalmology

## 2020-09-16 ENCOUNTER — Ambulatory Visit (INDEPENDENT_AMBULATORY_CARE_PROVIDER_SITE_OTHER): Payer: Medicare Other | Admitting: Ophthalmology

## 2020-09-16 ENCOUNTER — Other Ambulatory Visit: Payer: Self-pay

## 2020-09-16 DIAGNOSIS — H40003 Preglaucoma, unspecified, bilateral: Secondary | ICD-10-CM

## 2020-09-16 DIAGNOSIS — H02839 Dermatochalasis of unspecified eye, unspecified eyelid: Secondary | ICD-10-CM

## 2020-09-16 DIAGNOSIS — H35372 Puckering of macula, left eye: Secondary | ICD-10-CM | POA: Insufficient documentation

## 2020-09-16 DIAGNOSIS — H5340 Unspecified visual field defects: Secondary | ICD-10-CM

## 2020-09-16 DIAGNOSIS — H2510 Age-related nuclear cataract, unspecified eye: Secondary | ICD-10-CM | POA: Insufficient documentation

## 2020-09-16 DIAGNOSIS — H02831 Dermatochalasis of right upper eyelid: Secondary | ICD-10-CM | POA: Insufficient documentation

## 2020-09-16 DIAGNOSIS — Z961 Presence of intraocular lens: Secondary | ICD-10-CM | POA: Insufficient documentation

## 2020-09-16 DIAGNOSIS — H2511 Age-related nuclear cataract, right eye: Secondary | ICD-10-CM

## 2020-09-16 DIAGNOSIS — H02834 Dermatochalasis of left upper eyelid: Secondary | ICD-10-CM

## 2020-09-16 DIAGNOSIS — H47323 Drusen of optic disc, bilateral: Secondary | ICD-10-CM

## 2020-09-16 NOTE — Progress Notes (Addendum)
[facility]  [place] [address]  Operated by [facility]    09/16/2020    Patient Name: Carrie Schmidt    Date of Birth:  [birth year]    Referring Provider:  No referring provider defined for this encounter.    Ophthalmologist/Optometrist:      Chief Complaint     Ptosis          HPI     Ptosis      Additional comments: Dermatochalasis              Comments     Pt here for Dermatochalasis eval   Pt states VA is blurry from time to time  Denies fol, floaters  Pt notes OS closes shut on her  Pt states that she has discharge ou  Pt states that she tilts her head back to see better  Pt states that she raises her brows more to see better              Last edited by Matilde Sprang, [place] on 09/16/2020  3:55 PM. (History)          ROS     Positive for: Eyes (Dermatochalasis)    Negative for: Constitutional, Gastrointestinal, Neurological, Skin, Genitourinary, Musculoskeletal, HENT, Endocrine, Cardiovascular, Respiratory, Psychiatric, Allergic/Imm, Heme/Lymph    Last edited by Matilde Sprang, [place] on 09/16/2020  3:35 PM. (History)          History of Sun Exposure:no If yes:   Excessive sun burn:   History radiation to head:   History of skin cancer:        Past Ocular history:   Past Surgical History:   Procedure Laterality Date    CESAREAN SECTION      X2    EXTRACTION CATARACT  COMPLEX Left 08/05/2020    Performed by Aileen Pilot, MD at [facility] Left 08/05/2020    Dr. Juliane Lack    KNEE ARTHROSCOPY Left          Matilde Sprang, [place] 09/16/2020, 23:33    MD Addition to HPI:    The patient is a 70 y.o. female here with complaint of:  Progressive drooping lids  Ptosis evaluation: Droopy eyelids cause problems when trying to drive, watch TV and hobbies  Vision is improved by lifting my brows and tilting my head  Problems include: dry eyes  Uses eye drops or ointment:  yes    Symptoms have been present for several years.  Worsening over past year.  She had left cataract surgery on 08/05/20.  healed well from this.    Assessment     Impression(s):      ICD-10-CM    1. Dermatochalasis of both upper eyelids  H02.831 OPH EXTERNAL PHOTOS    H02.834 OPH 1 ISOPTER VF   2. Visual field defect  H53.40    3. Epiretinal membrane (ERM) of left eye  H35.372    4. 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 Pseudophakia  Z96.1        Plan(s)/Recommendation(s):    Senile dermatochalasis BUL with impact on ADL  Photo  +vf defect  - good candidate for bilateral blepharoplasty  Review r/b/a of BUL bleph  Pt understands and desires to schedule surgery    [place] OD/PCIOL OS  No PCO  Continue current Rx    Glaucoma suspect  IOP stable  F/U with Dr. Juliane Lack    Due to the unprecedented pandemic (COVID-19), the patient was counseled on RBA of treatment. Discussed proper hygiene, hand washing, social distancing. CDC guidelines were followed. Pt was told signs and symptoms of COVID-19, and was told to contact me or the Emergency Department if any appear.    Imaging (CT/MRI) Reviewed:   Results Reviewed:       SURGERY    Procedure:  BUL bleph, [id number]    Diagnosis:     ICD-10-CM    1. Dermatochalasis of both upper eyelids  H02.831 OPH EXTERNAL PHOTOS    H02.834 OPH 1 ISOPTER VF   2. Visual field defect  H53.40    3. Epiretinal membrane (ERM) of left eye  H35.372    4. 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 Pseudophakia  Z96.1        Anesthesia: MAC    Length of time: 60 minutes    Special Needs:    Risks, benefits, and alternatives explained.    Pt understands and elects to proceed with surgery/desires for surgery    I personally performed the services described in this documentation, as scribed  in my presence, and it is both accurate  and  complete.    Matilde Sprang, [place], MD 09/16/2020, 17:14  PGY-4  [facility]  Plastic, Reconstructive and Hand Surgery  Pager [phone]      I saw and examined the patient.  I reviewed the technician/resident's note.  I agree with the findings and plan of care as documented in the resident's note.  Any exceptions/additions are edited/noted.    [name], MD 09/16/2020  23:33      Eye Examination:     Neuro/Psych     Oriented x3: Yes    Mood/Affect: Normal        Visual Acuity     Visual Acuity (Snellen - Linear)       Right Left    Dist sc  20/20    Dist cc 20/20 -1     Correction: Contacts          Edited by: Matilde Sprang, [place]            Not recorded       Not recorded       Not recorded       Confrontational Visual Fields     Visual Fields (Counting fingers)       Right Left     Full Full          Edited by: Matilde Sprang, COA            Pupils       Pupils APD    Right PERRL None    Left PERRL None        Extraocular Movement     Extraocular Movement       Right Left     Full, Ortho Full, Ortho          Edited by: Matilde Sprang, [place]  Sensation   V1: Intact bilaterally   V2: Intact bilaterally   V3: Intact bilaterally    Facial Contours:     Hertel:                            OD:                            OS:                            Base:    Eyelids:    Bell's:      Lid edema YI:AXKP OS: None Conj chem OD: None VV:ZSMO   Lid inject OD: None OS: None Conj inject OD: None OS: None       6   1   PF   MRD       _0 LF   LC       15   10             USS   LSS                    LAG            Main Ophthalmology Exam     External Exam       Right Left    External thin brow in proper position, sitting on brow thin brow in proper position, sitting on brow          Slit Lamp Exam       Right Left    Lids/Lashes Dermatochalasis - upper lid Dermatochalasis - upper lid with hooding OU    Conjunctiva/Sclera White and quiet White and quiet     Cornea Clear Clear    Anterior Chamber Deep and quiet Deep and quiet    Iris Round and reactive Round and reactive    Lens 1-2+ CC, not in [place] Posterior chamber intraocular lens          Fundus Exam       Right Left    Vitreous Normal Normal                IOP straight Not recorded           Upgaze     5 down      Past Medical / Surgical / Family / Social History:      has a past medical history of Arthropathy, Cancer (CMS [place]), Heart murmur, HTN (hypertension), Hyperlipidemia, and Wears glasses.    She has no past medical history of Aneurysm (CMS HCC), Angina pectoris (CMS HCC), Asthma, Awareness under anesthesia, BiPAP (biphasic positive airway pressure) dependence, Chronic bronchitis (CMS HCC), Chronic obstructive airway disease (CMS HCC), Clotting disorder (CMS HCC), Congenital anomaly of heart, Congestive heart failure (CMS HCC), Convulsions (CMS HCC), Coronary artery disease, CPAP (continuous positive airway pressure) dependence, CVA (cerebrovascular accident) (CMS HCC), Deep vein thrombosis (DVT) (CMS HCC), Dysrhythmias, Esophageal reflux, H/O hearing loss, H/O urinary tract infection, Hard to intubate, History of anesthesia complications, History of kidney disease, Malignant hyperthermia, MI (myocardial infarction) (CMS HCC), Neck problem, PONV (postoperative nausea and vomiting), Pseudocholinesterase deficiency, Pulmonary embolism (CMS HCC), Shortness of breath, Sleep apnea, Thyroid disorder, Type 2 diabetes mellitus (CMS [place]), Type I diabetes mellitus (CMS [place]),  Upper respiratory infection, Viral hepatitis B, or Viral hepatitis C.    Past Surgical History:   Procedure Laterality Date    CESAREAN SECTION      X2    FOOT FRACTURE SURGERY      HX CATARACT REMOVAL Left 08/05/2020    Dr. Juliane Lack    KNEE ARTHROSCOPY Left            Family Medical History:     Problem Relation (Age of Onset)    Cancer Maternal Grandmother    Cataract Mother, Father    Diabetes Mother            Social History     Tobacco Use     Smoking status: Former Smoker     Quit date: 1972     Years since quitting: 50.2    Smokeless tobacco: Never Used   Substance Use Topics    Alcohol use: Yes     Alcohol/week: 1.0 standard drink     Types: 1 Shots of liquor per week     Comment: Occasional.     Drug use: Never       I have seen and examined the above patient. I discussed the above diagnoses listed in the assessment and the above ophthalmic plan of care with the patient and patient's family. All questions were answered. I reviewed and, when necessary, made changes to the technician/resident note, documented ophthalmology exam, chief complaint, history of present illness, allergies, review of systems, past medical, past surgical, family and social history.    [name], MD 09/16/2020  23:33

## 2020-09-21 ENCOUNTER — Other Ambulatory Visit: Payer: Self-pay | Admitting: Physician Assistant

## 2020-09-28 ENCOUNTER — Other Ambulatory Visit: Payer: Medicare Other

## 2020-10-01 ENCOUNTER — Telehealth: Payer: Medicare Other

## 2020-10-01 ENCOUNTER — Ambulatory Visit (INDEPENDENT_AMBULATORY_CARE_PROVIDER_SITE_OTHER): Payer: Self-pay | Admitting: Ophthalmology

## 2020-10-08 ENCOUNTER — Other Ambulatory Visit: Payer: Self-pay

## 2020-10-08 ENCOUNTER — Telehealth: Payer: Self-pay | Admitting: *Deleted

## 2020-10-08 MED FILL — Fluticasone-Salmeterol Aer Powder BA 250-50 MCG/ACT: RESPIRATORY_TRACT | 30 days supply | Qty: 60 | Fill #0 | Status: AC

## 2020-10-08 NOTE — Telephone Encounter (Signed)
Dr Sharlet Salina from [facility] called stating that he needs to speak with Dr Grayland Ormond ASAP about this patient and that she will need an appointment early next week for Gemcitabine, but that he really needs to speak with TF before that. He said to call the Paging Operator [phone] and have him paged

## 2020-10-09 ENCOUNTER — Encounter: Payer: Self-pay | Admitting: *Deleted

## 2020-10-09 NOTE — Telephone Encounter (Signed)
Call returned, but have not spoken yet. He wants patient to return to get single agent gemzar. Can we get her started in the next 1-2 weeks?

## 2020-10-10 ENCOUNTER — Other Ambulatory Visit: Payer: Self-pay | Admitting: Oncology

## 2020-10-10 DIAGNOSIS — C3490 Malignant neoplasm of unspecified part of unspecified bronchus or lung: Secondary | ICD-10-CM

## 2020-10-10 NOTE — Progress Notes (Signed)
DISCONTINUE ON PATHWAY REGIMEN - Non-Small Cell Lung     A cycle is every 21 days:     Pemetrexed   **Always confirm dose/schedule in your pharmacy ordering system**  REASON: Disease Progression PRIOR TREATMENT: [id number]: Pemetrexed 500 mg/m2 q21 Days Until Progression or Unacceptable Toxicity TREATMENT RESPONSE: Progressive Disease (PD)  START OFF PATHWAY REGIMEN - Non-Small Cell Lung   [id number]:Gemcitabine 1,000 mg/m2 D1, 8  q21 Days:   A cycle is every 21 days:     Gemcitabine   **Always confirm dose/schedule in your pharmacy ordering system**  Patient Characteristics: Stage IV Metastatic, Nonsquamous, Molecular Analysis Completed, Molecular Alteration Present and Targeted Therapy Exhausted OR EGFR Exon 20+ or KRAS G12C+ Present and No Prior Chemo/Immunotherapy OR No Alteration Present, Fourth Line and Beyond -  Chemotherapy/Immunotherapy, PS = 0, 1, Prior PD-1/PD-L1 Inhibitor or No Prior PD-1/PD-L1 Inhibitor and Not a Candidate for Immunotherapy Therapeutic Status: Stage IV Metastatic Histology: Nonsquamous Cell Broad Molecular Profiling Status: Molecular Analysis Completed Molecular Analysis Results: Alteration Present and Targeted Therapy Exhausted ECOG Performance Status: 1 Chemotherapy/Immunotherapy Line of Therapy: Fourth Line and Beyond Chemotherapy/Immunotherapy Immunotherapy Candidate Status: Not a Candidate for Immunotherapy Prior Immunotherapy Status: Prior PD-1/PD-L1 Inhibitor Intent of Therapy: Non-Curative / Palliative Intent, Discussed with Patient

## 2020-10-10 NOTE — Progress Notes (Signed)
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  INTERVAL HISTORY: Patient last seen in November 2021 at which time she transferred care to [facility] to enroll in clinical trial.  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  Plan is to start fourth line single agent gemcitabine today.  She has increased weakness and fatigue, but otherwise feels well.  She does not complain of pain today.  She has no neurologic complaints. She has a good appetite and denies weight loss.  She denies any chest pain, shortness of breath, cough, or hemoptysis.  She denies any nausea, vomiting, constipation, or diarrhea.  She has no urinary complaints.  Patient offers no further specific complaints today.    REVIEW OF SYSTEMS:   Review of Systems  Constitutional: Positive for malaise/fatigue. Negative for fever and weight loss.  Respiratory: Negative.  Negative for cough, hemoptysis and shortness of breath.   Cardiovascular: Negative.  Negative for chest pain and leg swelling.  Gastrointestinal: Negative.  Negative for abdominal pain, constipation, diarrhea and nausea.  Genitourinary: Negative.  Negative for dysuria and flank pain.  Musculoskeletal: Negative for back pain and joint pain.  Skin: Negative.  Negative for rash.  Neurological: Positive for weakness. Negative for dizziness, sensory change, focal weakness and headaches.  Psychiatric/Behavioral: Negative.  The patient is not nervous/anxious.     As per HPI. Otherwise, a complete review of systems is negative.  PAST MEDICAL HISTORY: Past Medical History:   Diagnosis Date  . Abnormal echocardiogram   . Acid reflux   . Allergic rhinitis   . Anemia    HAD TO HAVE IRON INFUSIONS BACK IN 2015  . Arthritis    bil hands and back  . Asthma   . Bloody discharge from left nipple   . Cancer of right lung ([place]) 2015   lung cancer - chemo, Dr Genevive Bi removed RML Lobectomy.   . Colon polyps   . COPD (chronic obstructive pulmonary disease) ([place])   . Dyspnea    WITH EXERTION  . Family history of breast cancer   . Family history of cervical cancer   . Family history of colon cancer   . Family history of skin cancer   . Hypertension   . Malignant neoplasm of middle lobe of right lung ([place])   . Mitral valve prolapse   . Mitral valve prolapse   . Non-small cell lung cancer ([place])   . Personal history of chemotherapy    F/U Lung Cancer  . Personal history of radiation therapy    Lung cancer  . Primary cancer of right middle lobe of lung ([place]) 03/11/2016  . Squamous cell carcinoma of skin 07/07/2009   right supraclavicular/EDC  . Squamous cell carcinoma of skin 11/04/2019   Left pretibial. [facility].  . Stage IV adenocarcinoma of lung, unspecified laterality ([place])     PAST SURGICAL HISTORY: Past Surgical History:  Procedure Laterality Date  . ABDOMINAL HYSTERECTOMY    . BAND HEMORRHOIDECTOMY    . BREAST BIOPSY Left 03/01/2019   Procedure: LEFT BREAST EXCISIONAL BIOPSY WITH NEEDLE LOCALIZATION;  Surgeon: Herbert Pun, MD;  Location: [facility] ORS;  Service: General;  Laterality: Left;  . BREAST EXCISIONAL BIOPSY Left 1994 (-), 2004 (-)   benign  . BREAST EXCISIONAL BIOPSY Left 03/01/2019   - INTRADUCTAL PAPILLOMA WITH SCLEROSIS.   Marland Kitchen COLONOSCOPY  2008    Dr. Donnella Sham  . COLONOSCOPY WITH PROPOFOL N/A 04/12/2019   Procedure: COLONOSCOPY WITH PROPOFOL;  Surgeon: Lollie Sails, MD;  Location: [facility] ENDOSCOPY;  Service: Endoscopy;  Laterality: N/A;  . IR RADIOLOGIST EVAL & MGMT  08/28/2019  . IR RADIOLOGIST EVAL & MGMT  10/30/2019  . IR  RADIOLOGIST EVAL & MGMT  11/26/2019  . KNEE SURGERY  2007  . LUNG LOBECTOMY Right    middle lobe  . PORTACATH PLACEMENT Right 02/09/2017   Procedure: INSERTION PORT-A-CATH;  Surgeon: Nestor Lewandowsky, MD;  Location: [facility] ORS;  Service: General;  Laterality: Right;  . RADIOLOGY WITH ANESTHESIA N/A 10/02/2019   Procedure: MICROWAVE THERMA ABLATION;  Surgeon: Aletta Edouard, MD;  Location: [facility] ORS;  Service: Radiology;  Laterality: N/A;  . SALIVARY GLAND SURGERY Left 1983   with lymph node removal also  . SALIVARY GLAND SURGERY     salivary gland removal   . UPPER GI ENDOSCOPY  2008  . VIDEO BRONCHOSCOPY WITH ENDOBRONCHIAL ULTRASOUND Right 01/24/2017   Procedure: VIDEO BRONCHOSCOPY WITH ENDOBRONCHIAL ULTRASOUND;  Surgeon: [name], MD;  Location: [facility] ORS;  Service: Pulmonary;  Laterality: Right;    FAMILY HISTORY: Family History  Problem Relation Age of Onset  . Breast cancer Maternal Aunt 60  . Alzheimer's disease Maternal Aunt   . Breast cancer Paternal Aunt        dx. in her late 71s  . Breast cancer Maternal Grandmother        dx. in her 17s  . Cervical cancer Maternal Grandmother 60  . Colon cancer Maternal Grandmother        dx. in her 65s  . Breast cancer Maternal Aunt 60  . Alzheimer's disease Maternal Aunt   . Breast cancer Paternal Aunt        dx. in her late 72s  . Heart disease Paternal Aunt   . Healthy Mother   . Healthy Father   . Colon cancer Father 71  . Skin cancer Father   . Heart attack Father   . Heart attack Paternal Grandfather   . Skin cancer Paternal Aunt   . Heart attack Paternal Uncle 65  . Heart attack Paternal Uncle     ADVANCED DIRECTIVES (Y/N):  N  HEALTH MAINTENANCE: Social History   Tobacco Use  . Smoking status: Never Smoker  . Smokeless tobacco: Never Used  Vaping Use  . Vaping Use: Never used  Substance Use Topics  . Alcohol use: Yes    Alcohol/week: 7.0 standard drinks    Types: 7 Glasses of wine per week  . Drug use:  No     Colonoscopy:  PAP:  Bone density:  Lipid panel:  Allergies  Allergen Reactions  . Other Other (See Comments)    Cat gut sutures get infected Cat gut sutures get infected  . Penicillins Swelling and Rash    Did it involve swelling of the face/tongue/throat, SOB, or low BP? Unknown Did it involve sudden or severe rash/hives, skin peeling, or any reaction on the inside of your mouth or nose? No Did you need to seek medical attention at a hospital or doctor's office? Yes When did it last happen?30 + years  If all above answers are "NO", may proceed with cephalosporin use.    Marland Kitchen  Sulfa Antibiotics Nausea And Vomiting    Current Outpatient Medications  Medication Sig Dispense Refill  . albuterol (VENTOLIN HFA) 108 (90 Base) MCG/ACT inhaler Inhale 1-2 puffs into the lungs every 6 (six) hours as needed for wheezing or shortness of breath. 1 g 10  . ascorbic acid (VITAMIN C) 100 MG tablet Take 100 mg by mouth daily.     . B Complex Vitamins (VITAMIN B COMPLEX PO) Take 1 tablet by mouth daily.     . Biotin w/ Vitamins C & E (HAIR/SKIN/NAILS PO) Take 1 tablet by mouth daily.    . Calcium Carb-Cholecalciferol 600-800 MG-UNIT TABS Take 2 tablets by mouth daily.    . Cholecalciferol 125 MCG (5000 UT) capsule Take 5,000 Units by mouth daily.    . clobetasol ointment (TEMOVATE) 7.84 % Apply 1 application topically 2 (two) times daily. Dry area and apply to mouth sores twice daily as needed for up to 2 weeks. 15 g 0  . COLLAGEN PO Take 1,000 mg by mouth daily.    . cyclobenzaprine (FLEXERIL) 5 MG tablet Take 1 tablet (5 mg total) by mouth 3 (three) times daily as needed for muscle spasms. 30 tablet 0  . fexofenadine (ALLEGRA) 180 MG tablet Take 180 mg by mouth daily as needed for allergies.     . fluticasone (FLONASE) 50 MCG/ACT nasal spray Place 2 sprays into both nostrils daily as needed for allergies or rhinitis.    . Fluticasone-Salmeterol (ADVAIR) 250-50 MCG/DOSE AEPB INHALE 1  PUFF BY MOUTH 2 TIMES DAILY AS NEEDED FOR BREATHING ISSUES 60 each 10  . ibuprofen (ADVIL) 800 MG tablet TAKE 1 TABLET BY MOUTH EVERY 8 HOURS AS NEEDED FOR PAIN **ALWAYS TAKE WITH FOOD** 90 tablet 2  . lansoprazole (PREVACID) 30 MG capsule TAKE 1 CAPSULE BY MOUTH TWICE DAILY 180 capsule 3  . lidocaine-prilocaine (EMLA) cream Apply 1 application topically as needed. Apply generously over the Mediport 45 minutes prior to chemotherapy. 30 g 0  . LORazepam (ATIVAN) 0.5 MG tablet DISSOLVE 1 TABLET UNDER TONGUE 30 MINUTES PRIOR TO CT OR MRI SCANS AS NEEDED. 5 tablet 0  . polyethylene glycol (MIRALAX / GLYCOLAX) packet Take 17 g by mouth daily as needed for mild constipation.    . prochlorperazine (COMPAZINE) 10 MG tablet TAKE 1 TABLET (10 MG TOTAL) BY MOUTH EVERY 6 (SIX) HOURS AS NEEDED FOR NAUSEA OR VOMITING. 40 tablet 1  . traMADol (ULTRAM) 50 MG tablet TAKE 1 TO 2 TABLETS BY MOUTH EVERY 6 (SIX) HOURS AS NEEDED 90 tablet 0  . triamcinolone (KENALOG) 0.1 % paste Use as directed 1 application in the mouth or throat 2 (two) times daily. 15 g 12  . Vitamin D, Ergocalciferol, (DRISDOL) 1.25 MG (50000 UNIT) CAPS capsule TAKE 1 CAPSULE (50,000 UNITS TOTAL) BY MOUTH ONCE A WEEK 12 capsule 3  . zinc gluconate 50 MG tablet Take 50 mg by mouth daily.    Marland Kitchen ALPRAZolam (XANAX) 0.25 MG tablet Take 1 tablet (0.25 mg total) by mouth at bedtime as needed for anxiety. 30 tablet 0  . magic mouthwash SOLN Take 5-10 mLs by mouth 4 (four) times daily as needed for mouth pain. (Patient not taking: Reported on 10/15/2020) 480 mL 0  . ondansetron (ZOFRAN) 8 MG tablet Take 1 tablet (8 mg total) by mouth every 8 (eight) hours as needed for nausea or vomiting (start 3 days; after chemo). (Patient not taking: Reported on 10/15/2020) 40 tablet 1  . osimertinib mesylate (TAGRISSO) 80 MG tablet  Take by mouth.     No current facility-administered medications for this visit.   Facility-Administered Medications Ordered in Other Visits   Medication Dose Route Frequency Provider Last Rate Last Admin  . heparin lock flush 100 unit/mL  500 Units Intravenous Once Lloyd Huger, MD      . sodium chloride flush (NS) 0.9 % injection 10 mL  10 mL Intravenous PRN Lloyd Huger, MD      . sodium chloride flush (NS) 0.9 % injection 10 mL  10 mL Intravenous PRN Lloyd Huger, MD   10 mL at 01/22/18 1419    OBJECTIVE: Vitals:   10/15/20 0955  BP: 117/73  Pulse: (!) 102  Resp: 17  Temp: (!) 97.2 F (36.2 C)  SpO2: 97%     Body mass index is 21.03 kg/m.    ECOG FS:1 - Symptomatic but completely ambulatory  General: Well-developed, well-nourished, no acute distress. Eyes: Pink conjunctiva, anicteric sclera. HEENT: Normocephalic, moist mucous membranes. Lungs: No audible wheezing or coughing. Heart: Regular rate and rhythm. Abdomen: Soft, nontender, no obvious distention. Musculoskeletal: No edema, cyanosis, or clubbing. Neuro: Alert, answering all questions appropriately. Cranial nerves grossly intact. Skin: No rashes or petechiae noted. Psych: Normal affect.  LAB RESULTS:  Lab Results  Component Value Date   NA 133 (L) 10/15/2020   K 4.1 10/15/2020   CL 100 10/15/2020   CO2 24 10/15/2020   GLUCOSE 105 (H) 10/15/2020   BUN 18 10/15/2020   CREATININE 1.12 (H) 10/15/2020   CALCIUM 9.0 10/15/2020   PROT 7.3 10/15/2020   ALBUMIN 3.6 10/15/2020   AST 37 10/15/2020   ALT 19 10/15/2020   ALKPHOS 134 (H) 10/15/2020   BILITOT 0.6 10/15/2020   GFRNONAA 53 (L) 10/15/2020   GFRAA >60 03/23/2020    Lab Results  Component Value Date   WBC 5.0 10/15/2020   NEUTROABS 3.5 10/15/2020   HGB 10.5 (L) 10/15/2020   HCT 32.3 (L) 10/15/2020   MCV 79.6 (L) 10/15/2020   PLT 233 10/15/2020     STUDIES: No results found.  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  She initially received 4 cycles of adjuvant cisplatin and Alimta completing on  January 23, 2014.  Patient was on the [facility] protocol for maintenance Tarceva versus placebo, but discontinued treatment in November 2015 because of significant side effects.  Although blinded, presumption was she was receiving Tarceva.  She recently was noted to have a recurrence confirmed by right paratracheal lymph node biopsy on January 24, 2017.  PET scan on February 03, 2017 did not reveal any distant metastasis.  She underwent treatment with weekly carboplatinum and Taxol along with daily XRT finishing in October 2018.  She initiated maintenance durvalumab on June 29, 2017.  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  Patient received Tagrisso from April 2019 through September 2021. PET scan results from July 09, 2019 revealed mild progression of disease in her lungs with an isolated left kidney metastasis that has undergone ablation. PET scan on March 11, 2020 revealed continued progression of disease in her lungs.    She subsequently discontinued Tagrisso and initiated single agent pemetrexed on March 23, 2020.    Patient enrolled in clinical trial at [facility] between December 2021 and April 2022.  She initiated single agent gemcitabine in April 2022.  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  PLAN:   1. 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  Patient's most recent imaging with CT scan at [facility] on October 05, 2020 revealed new and increasing size of bilateral pulmonary nodules, bilateral axillary and left supraclavicular lymphadenopathy, and interval enlargement of multiple lesions throughout left and right lobes of liver.  MRI of the brain also on October 05, 2020 revealed stable metastatic lesions.  Because of progression of disease, patient discontinued clinical trial and now will initiate single agent gemcitabine on days 1 and 8 with day 15  off.  Will reimage in July 2022.   2.  Diarrhea: Patient does not complain of this today.  Continue Lomotil as prescribed. 3.  Cough: Chronic and unchanged.  Patient does not complain of this today.  Monitor.   4.  Breast lesion: Benign papilloma.  Patient underwent lumpectomy on March 01, 2019.  Her most recent mammogram on March 03, 2020 was reported as BI-RADS 2.   5.  Anemia: Patient's hemoglobin is 10.5 today, monitor. 6.  Renal insufficiency: Mild, monitor.  Creatinine 1.12 today. 7.  Hyponatremia: Patient's sodium is 133, monitor.   Patient expressed understanding and was in agreement with this plan. She also understands that She can call clinic at any time with any questions, concerns, or complaints.    Lloyd Huger, MD   10/16/2020 7:59 AM

## 2020-10-12 ENCOUNTER — Ambulatory Visit: Payer: Medicare Other | Attending: Ophthalmology | Admitting: Ophthalmology

## 2020-10-12 ENCOUNTER — Ambulatory Visit (HOSPITAL_BASED_OUTPATIENT_CLINIC_OR_DEPARTMENT_OTHER)
Admission: RE | Admit: 2020-10-12 | Discharge: 2020-10-12 | Disposition: A | Discharge status: Home / Self Care | Payer: Medicare Other | Source: Ambulatory Visit

## 2020-10-12 ENCOUNTER — Other Ambulatory Visit: Payer: Self-pay

## 2020-10-12 DIAGNOSIS — H02831 Dermatochalasis of right upper eyelid: Secondary | ICD-10-CM

## 2020-10-12 DIAGNOSIS — H02834 Dermatochalasis of left upper eyelid: Secondary | ICD-10-CM | POA: Insufficient documentation

## 2020-10-15 ENCOUNTER — Other Ambulatory Visit: Payer: Self-pay | Admitting: *Deleted

## 2020-10-15 ENCOUNTER — Inpatient Hospital Stay: Payer: Medicare Other

## 2020-10-15 ENCOUNTER — Encounter: Payer: Self-pay | Admitting: Oncology

## 2020-10-15 ENCOUNTER — Other Ambulatory Visit: Payer: Self-pay

## 2020-10-15 ENCOUNTER — Inpatient Hospital Stay: Payer: Medicare Other | Attending: Oncology | Admitting: Oncology

## 2020-10-15 VITALS — BP 117/73 | HR 102 | Temp 97.2°F | Resp 17 | Ht 63.0 in | Wt 118.7 lb

## 2020-10-15 VITALS — HR 96

## 2020-10-15 DIAGNOSIS — C3490 Malignant neoplasm of unspecified part of unspecified bronchus or lung: Secondary | ICD-10-CM

## 2020-10-15 DIAGNOSIS — D72819 Decreased white blood cell count, unspecified: Secondary | ICD-10-CM | POA: Diagnosis not present

## 2020-10-15 DIAGNOSIS — D649 Anemia, unspecified: Secondary | ICD-10-CM | POA: Diagnosis not present

## 2020-10-15 DIAGNOSIS — R197 Diarrhea, unspecified: Secondary | ICD-10-CM | POA: Insufficient documentation

## 2020-10-15 DIAGNOSIS — C342 Malignant neoplasm of middle lobe, bronchus or lung: Secondary | ICD-10-CM | POA: Diagnosis present

## 2020-10-15 DIAGNOSIS — C7931 Secondary malignant neoplasm of brain: Secondary | ICD-10-CM | POA: Diagnosis not present

## 2020-10-15 DIAGNOSIS — Z5111 Encounter for antineoplastic chemotherapy: Secondary | ICD-10-CM | POA: Insufficient documentation

## 2020-10-15 DIAGNOSIS — E871 Hypo-osmolality and hyponatremia: Secondary | ICD-10-CM | POA: Diagnosis not present

## 2020-10-15 LAB — COMPREHENSIVE METABOLIC PANEL
ALT: 19 U/L (ref 0–44)
AST: 37 U/L (ref 15–41)
Albumin: 3.6 g/dL (ref 3.5–5.0)
Alkaline Phosphatase: 134 U/L — ABNORMAL HIGH (ref 38–126)
Anion gap: 9 (ref 5–15)
BUN: 18 mg/dL (ref 8–23)
CO2: 24 mmol/L (ref 22–32)
Calcium: 9 mg/dL (ref 8.9–10.3)
Chloride: 100 mmol/L (ref 98–111)
Creatinine, Ser: 1.12 mg/dL — ABNORMAL HIGH (ref 0.44–1.00)
GFR, Estimated: 53 mL/min — ABNORMAL LOW (ref >60)
Glucose, Bld: 105 mg/dL — ABNORMAL HIGH (ref 70–99)
Potassium: 4.1 mmol/L (ref 3.5–5.1)
Sodium: 133 mmol/L — ABNORMAL LOW (ref 135–145)
Total Bilirubin: 0.6 mg/dL (ref 0.3–1.2)
Total Protein: 7.3 g/dL (ref 6.5–8.1)

## 2020-10-15 LAB — CBC WITH DIFFERENTIAL/PLATELET
Abs Immature Granulocytes: 0.01 10*3/uL (ref 0.00–0.07)
Basophils Absolute: 0 10*3/uL (ref 0.0–0.1)
Basophils Relative: 1 %
Eosinophils Absolute: 0.3 10*3/uL (ref 0.0–0.5)
Eosinophils Relative: 6 %
HCT: 32.3 % — ABNORMAL LOW (ref 36.0–46.0)
Hemoglobin: 10.5 g/dL — ABNORMAL LOW (ref 12.0–15.0)
Immature Granulocytes: 0 %
Lymphocytes Relative: 15 %
Lymphs Abs: 0.7 10*3/uL (ref 0.7–4.0)
MCH: 25.9 pg — ABNORMAL LOW (ref 26.0–34.0)
MCHC: 32.5 g/dL (ref 30.0–36.0)
MCV: 79.6 fL — ABNORMAL LOW (ref 80.0–100.0)
Monocytes Absolute: 0.4 10*3/uL (ref 0.1–1.0)
Monocytes Relative: 8 %
Neutro Abs: 3.5 10*3/uL (ref 1.7–7.7)
Neutrophils Relative %: 70 %
Platelets: 233 10*3/uL (ref 150–400)
RBC: 4.06 MIL/uL (ref 3.87–5.11)
RDW: 17 % — ABNORMAL HIGH (ref 11.5–15.5)
WBC: 5 10*3/uL (ref 4.0–10.5)
nRBC: 0 % (ref 0.0–0.2)

## 2020-10-15 LAB — PHOSPHORUS: Phosphorus: 3.9 mg/dL (ref 2.5–4.6)

## 2020-10-15 LAB — MAGNESIUM: Magnesium: 1.9 mg/dL (ref 1.7–2.4)

## 2020-10-15 MED ORDER — PROCHLORPERAZINE MALEATE 10 MG PO TABS
10.0000 mg | ORAL_TABLET | Freq: Once | ORAL | Status: AC
  Administered 2020-10-15: 10 mg via ORAL
  Filled 2020-10-15: qty 1

## 2020-10-15 MED ORDER — HEPARIN SOD (PORK) LOCK FLUSH 100 UNIT/ML IV SOLN
500.0000 [IU] | Freq: Once | INTRAVENOUS | Status: AC | PRN
  Administered 2020-10-15: 500 [IU]
  Filled 2020-10-15: qty 5

## 2020-10-15 MED ORDER — ALPRAZOLAM 0.25 MG PO TABS: 0.2500 mg | ORAL_TABLET | Freq: Every evening | ORAL | 0 refills | Status: DC | PRN

## 2020-10-15 MED ORDER — SODIUM CHLORIDE 0.9% FLUSH
10.0000 mL | INTRAVENOUS | Status: DC | PRN
  Administered 2020-10-15: 10 mL
  Filled 2020-10-15: qty 10

## 2020-10-15 MED ORDER — SODIUM CHLORIDE 0.9 % IV SOLN
Freq: Once | INTRAVENOUS | Status: AC
  Filled 2020-10-15: qty 250

## 2020-10-15 MED ORDER — HEPARIN SOD (PORK) LOCK FLUSH 100 UNIT/ML IV SOLN
INTRAVENOUS | Status: AC
  Filled 2020-10-15: qty 5

## 2020-10-15 MED ORDER — SODIUM CHLORIDE 0.9 % IV SOLN
1600.0000 mg | Freq: Once | INTRAVENOUS | Status: AC
  Administered 2020-10-15: 1600 mg via INTRAVENOUS
  Filled 2020-10-15: qty 26.3

## 2020-10-16 ENCOUNTER — Other Ambulatory Visit: Payer: Self-pay | Admitting: *Deleted

## 2020-10-16 ENCOUNTER — Other Ambulatory Visit: Payer: Self-pay

## 2020-10-16 MED ORDER — ALPRAZOLAM 0.25 MG PO TABS
0.2500 mg | ORAL_TABLET | Freq: Every evening | ORAL | 0 refills | Status: DC | PRN
  Filled 2020-10-16: qty 30, 30d supply, fill #0

## 2020-10-16 NOTE — Progress Notes (Signed)
Opened in error

## 2020-10-18 NOTE — Progress Notes (Signed)
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  INTERVAL HISTORY: Patient returns to clinic today for further evaluation and consideration of cycle 1, day 8 of single agent gemcitabine.  She noticed increased weakness and fatigue after her last treatment, particularly over the weekend.  She also had nausea and diarrhea that were well controlled with her current medications.  She has no neurologic complaints.  She denies any fevers.  She has a good appetite and denies weight loss.  She denies any chest pain, shortness of breath, cough, or hemoptysis.  She denies any nausea, vomiting, constipation, or diarrhea.  She has no urinary complaints.  Patient offers no further specific complaints today.    REVIEW OF SYSTEMS:   Review of Systems  Constitutional: Positive for malaise/fatigue. Negative for fever and weight loss.  Respiratory: Negative.  Negative for cough, hemoptysis and shortness of breath.   Cardiovascular: Negative.  Negative for chest pain and leg swelling.  Gastrointestinal: Positive for diarrhea and nausea. Negative for abdominal pain and constipation.  Genitourinary: Negative.  Negative for dysuria and flank pain.  Musculoskeletal: Negative for back pain and joint pain.  Skin: Negative.  Negative for rash.  Neurological: Positive for weakness. Negative for dizziness, sensory change, focal weakness and headaches.  Psychiatric/Behavioral: Negative.  The patient is not nervous/anxious.     As per HPI. Otherwise, a complete review of systems is negative.  PAST MEDICAL HISTORY: Past Medical History:  Diagnosis Date  . Abnormal echocardiogram   . Acid reflux   . Allergic rhinitis    . Anemia    HAD TO HAVE IRON INFUSIONS BACK IN 2015  . Arthritis    bil hands and back  . Asthma   . Bloody discharge from left nipple   . Cancer of right lung ([place]) 2015   lung cancer - chemo, Dr Genevive Bi removed RML Lobectomy.   . Colon polyps   . COPD (chronic obstructive pulmonary disease) ([place])   . Dyspnea    WITH EXERTION  . Family history of breast cancer   . Family history of cervical cancer   . Family history of colon cancer   . Family history of skin cancer   . Hypertension   . Malignant neoplasm of middle lobe of right lung ([place])   . Mitral valve prolapse   . Mitral valve prolapse   . Non-small cell lung cancer ([place])   . Personal history of chemotherapy    F/U Lung Cancer  . Personal history of radiation therapy    Lung cancer  . Primary cancer of right middle lobe of lung ([place]) 03/11/2016  . Squamous cell carcinoma of skin 07/07/2009   right supraclavicular/EDC  . Squamous cell carcinoma of skin 11/04/2019   Left pretibial. [facility].  . Stage IV adenocarcinoma of lung, unspecified laterality ([place])     PAST SURGICAL HISTORY: Past Surgical History:  Procedure Laterality Date  . ABDOMINAL HYSTERECTOMY    . BAND HEMORRHOIDECTOMY    . BREAST BIOPSY Left 03/01/2019   Procedure: LEFT BREAST EXCISIONAL BIOPSY WITH NEEDLE LOCALIZATION;  Surgeon: Herbert Pun, MD;  Location: [facility] ORS;  Service: General;  Laterality: Left;  . BREAST EXCISIONAL BIOPSY Left 1994 (-), 2004 (-)   benign  .  BREAST EXCISIONAL BIOPSY Left 03/01/2019   - INTRADUCTAL PAPILLOMA WITH SCLEROSIS.   [facility] COLONOSCOPY  2008    Dr. Donnella Sham  . COLONOSCOPY WITH PROPOFOL N/A 04/12/2019   Procedure: COLONOSCOPY WITH PROPOFOL;  Surgeon: Lollie Sails, MD;  Location: [facility] ENDOSCOPY;  Service: Endoscopy;  Laterality: N/A;  . IR RADIOLOGIST EVAL & MGMT  08/28/2019  . IR RADIOLOGIST EVAL & MGMT  10/30/2019  . IR RADIOLOGIST EVAL & MGMT  11/26/2019  . KNEE SURGERY  2007  . LUNG LOBECTOMY Right    middle  lobe  . PORTACATH PLACEMENT Right 02/09/2017   Procedure: INSERTION PORT-A-CATH;  Surgeon: Nestor Lewandowsky, MD;  Location: [facility] ORS;  Service: General;  Laterality: Right;  . RADIOLOGY WITH ANESTHESIA N/A 10/02/2019   Procedure: MICROWAVE THERMA ABLATION;  Surgeon: Aletta Edouard, MD;  Location: [facility] ORS;  Service: Radiology;  Laterality: N/A;  . SALIVARY GLAND SURGERY Left 1983   with lymph node removal also  . SALIVARY GLAND SURGERY     salivary gland removal   . UPPER GI ENDOSCOPY  2008  . VIDEO BRONCHOSCOPY WITH ENDOBRONCHIAL ULTRASOUND Right 01/24/2017   Procedure: VIDEO BRONCHOSCOPY WITH ENDOBRONCHIAL ULTRASOUND;  Surgeon: [name], MD;  Location: [facility] ORS;  Service: Pulmonary;  Laterality: Right;    FAMILY HISTORY: Family History  Problem Relation Age of Onset  . Breast cancer Maternal Aunt 60  . Alzheimer's disease Maternal Aunt   . Breast cancer Paternal Aunt        dx. in her late 65s  . Breast cancer Maternal Grandmother        dx. in her 74s  . Cervical cancer Maternal Grandmother 60  . Colon cancer Maternal Grandmother        dx. in her 33s  . Breast cancer Maternal Aunt 60  . Alzheimer's disease Maternal Aunt   . Breast cancer Paternal Aunt        dx. in her late 32s  . Heart disease Paternal Aunt   . Healthy Mother   . Healthy Father   . Colon cancer Father 47  . Skin cancer Father   . Heart attack Father   . Heart attack Paternal Grandfather   . Skin cancer Paternal Aunt   . Heart attack Paternal Uncle 57  . Heart attack Paternal Uncle     ADVANCED DIRECTIVES (Y/N):  N  HEALTH MAINTENANCE: Social History   Tobacco Use  . Smoking status: Never Smoker  . Smokeless tobacco: Never Used  Vaping Use  . Vaping Use: Never used  Substance Use Topics  . Alcohol use: Yes    Alcohol/week: 7.0 standard drinks    Types: 7 Glasses of wine per week  . Drug use: No     Colonoscopy:  PAP:  Bone density:  Lipid panel:  Allergies  Allergen Reactions   . Other Other (See Comments)    Cat gut sutures get infected Cat gut sutures get infected  . Penicillins Swelling and Rash    Did it involve swelling of the face/tongue/throat, SOB, or low BP? Unknown Did it involve sudden or severe rash/hives, skin peeling, or any reaction on the inside of your mouth or nose? No Did you need to seek medical attention at a hospital or doctor's office? Yes When did it last happen?30 + years  If all above answers are "NO", may proceed with cephalosporin use.    . Sulfa Antibiotics Nausea And Vomiting    Current Outpatient Medications  Medication Sig Dispense Refill  .  albuterol (VENTOLIN HFA) 108 (90 Base) MCG/ACT inhaler Inhale 1-2 puffs into the lungs every 6 (six) hours as needed for wheezing or shortness of breath. 1 g 10  . ALPRAZolam (XANAX) 0.25 MG tablet Take 1 tablet (0.25 mg total) by mouth at bedtime as needed for anxiety. 30 tablet 0  . ascorbic acid (VITAMIN C) 100 MG tablet Take 100 mg by mouth daily.     . B Complex Vitamins (VITAMIN B COMPLEX PO) Take 1 tablet by mouth daily.     . Biotin w/ Vitamins C & E (HAIR/SKIN/NAILS PO) Take 1 tablet by mouth daily.    . Calcium Carb-Cholecalciferol 600-800 MG-UNIT TABS Take 2 tablets by mouth daily.    . Cholecalciferol 125 MCG (5000 UT) capsule Take 5,000 Units by mouth daily.    . clobetasol ointment (TEMOVATE) 2.72 % Apply 1 application topically 2 (two) times daily. Dry area and apply to mouth sores twice daily as needed for up to 2 weeks. 15 g 0  . COLLAGEN PO Take 1,000 mg by mouth daily.    . cyclobenzaprine (FLEXERIL) 5 MG tablet Take 1 tablet (5 mg total) by mouth 3 (three) times daily as needed for muscle spasms. 30 tablet 0  . fexofenadine (ALLEGRA) 180 MG tablet Take 180 mg by mouth daily as needed for allergies.     . fluticasone (FLONASE) 50 MCG/ACT nasal spray Place 2 sprays into both nostrils daily as needed for allergies or rhinitis.    . Fluticasone-Salmeterol (ADVAIR)  250-50 MCG/DOSE AEPB INHALE 1 PUFF BY MOUTH 2 TIMES DAILY AS NEEDED FOR BREATHING ISSUES 60 each 10  . ibuprofen (ADVIL) 800 MG tablet TAKE 1 TABLET BY MOUTH EVERY 8 HOURS AS NEEDED FOR PAIN **ALWAYS TAKE WITH FOOD** 90 tablet 2  . lansoprazole (PREVACID) 30 MG capsule TAKE 1 CAPSULE BY MOUTH TWICE DAILY 180 capsule 3  . lidocaine-prilocaine (EMLA) cream Apply 1 application topically as needed. Apply generously over the Mediport 45 minutes prior to chemotherapy. 30 g 0  . LORazepam (ATIVAN) 0.5 MG tablet DISSOLVE 1 TABLET UNDER TONGUE 30 MINUTES PRIOR TO CT OR MRI SCANS AS NEEDED. 5 tablet 0  . ondansetron (ZOFRAN) 8 MG tablet Take 1 tablet (8 mg total) by mouth every 8 (eight) hours as needed for nausea or vomiting (start 3 days; after chemo). 40 tablet 1  . osimertinib mesylate (TAGRISSO) 80 MG tablet Take by mouth.    . polyethylene glycol (MIRALAX / GLYCOLAX) packet Take 17 g by mouth daily as needed for mild constipation.    . prochlorperazine (COMPAZINE) 10 MG tablet TAKE 1 TABLET (10 MG TOTAL) BY MOUTH EVERY 6 (SIX) HOURS AS NEEDED FOR NAUSEA OR VOMITING. 40 tablet 1  . traMADol (ULTRAM) 50 MG tablet TAKE 1 TO 2 TABLETS BY MOUTH EVERY 6 (SIX) HOURS AS NEEDED 90 tablet 0  . triamcinolone (KENALOG) 0.1 % paste Use as directed 1 application in the mouth or throat 2 (two) times daily. 15 g 12  . Vitamin D, Ergocalciferol, (DRISDOL) 1.25 MG (50000 UNIT) CAPS capsule TAKE 1 CAPSULE (50,000 UNITS TOTAL) BY MOUTH ONCE A WEEK 12 capsule 3  . zinc gluconate 50 MG tablet Take 50 mg by mouth daily.    . magic mouthwash SOLN Take 5-10 mLs by mouth 4 (four) times daily as needed for mouth pain. (Patient not taking: No sig reported) 480 mL 0   No current facility-administered medications for this visit.   Facility-Administered Medications Ordered in Other Visits  Medication  Dose Route Frequency Provider Last Rate Last Admin  . heparin lock flush 100 unit/mL  500 Units Intravenous Once Lloyd Huger,  MD      . sodium chloride flush (NS) 0.9 % injection 10 mL  10 mL Intravenous PRN Lloyd Huger, MD      . sodium chloride flush (NS) 0.9 % injection 10 mL  10 mL Intravenous PRN Lloyd Huger, MD   10 mL at 01/22/18 1419    OBJECTIVE: Vitals:   10/22/20 0950  BP: (!) 112/91  Pulse: (!) 105  Resp: 18  Temp: 98.5 F (36.9 C)  SpO2: 100%     Body mass index is 21.24 kg/m.    ECOG FS:1 - Symptomatic but completely ambulatory  General: Well-developed, well-nourished, no acute distress. Eyes: Pink conjunctiva, anicteric sclera. HEENT: Normocephalic, moist mucous membranes. Lungs: No audible wheezing or coughing. Heart: Regular rate and rhythm. Abdomen: Soft, nontender, no obvious distention. Musculoskeletal: No edema, cyanosis, or clubbing. Neuro: Alert, answering all questions appropriately. Cranial nerves grossly intact. Skin: No rashes or petechiae noted. Psych: Normal affect.  LAB RESULTS:  Lab Results  Component Value Date   NA 133 (L) 10/22/2020   K 4.4 10/22/2020   CL 98 10/22/2020   CO2 23 10/22/2020   GLUCOSE 101 (H) 10/22/2020   BUN 15 10/22/2020   CREATININE 1.00 10/22/2020   CALCIUM 9.0 10/22/2020   PROT 7.2 10/22/2020   ALBUMIN 2.9 (L) 10/22/2020   AST 42 (H) 10/22/2020   ALT 31 10/22/2020   ALKPHOS 148 (H) 10/22/2020   BILITOT 0.5 10/22/2020   GFRNONAA >60 10/22/2020   GFRAA >60 03/23/2020    Lab Results  Component Value Date   WBC 2.9 (L) 10/22/2020   NEUTROABS 1.9 10/22/2020   HGB 9.4 (L) 10/22/2020   HCT 28.7 (L) 10/22/2020   MCV 79.3 (L) 10/22/2020   PLT 138 (L) 10/22/2020     STUDIES: No results found.  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  She initially received 4 cycles of adjuvant cisplatin and Alimta completing on January 23, 2014.  Patient was on the [facility] protocol for maintenance Tarceva versus placebo, but discontinued treatment in November 2015  because of significant side effects.  Although blinded, presumption was she was receiving Tarceva.  She recently was noted to have a recurrence confirmed by right paratracheal lymph node biopsy on January 24, 2017.  PET scan on February 03, 2017 did not reveal any distant metastasis.  She underwent treatment with weekly carboplatinum and Taxol along with daily XRT finishing in October 2018.  She initiated maintenance durvalumab on June 29, 2017.  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  Patient received Tagrisso from April 2019 through September 2021. PET scan results from July 09, 2019 revealed mild progression of disease in her lungs with an isolated left kidney metastasis that has undergone ablation. PET scan on March 11, 2020 revealed continued progression of disease in her lungs.    She subsequently discontinued Tagrisso and initiated single agent pemetrexed on March 23, 2020.    Patient enrolled in clinical trial at [facility] between December 2021 and April 2022.  She initiated single agent gemcitabine on October 15, 2020.   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  PLAN:   1. 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  Patient's most recent imaging with CT scan at [facility] on October 05, 2020 revealed new and increasing size of bilateral pulmonary nodules, bilateral axillary and left supraclavicular lymphadenopathy, and interval enlargement of multiple lesions throughout left and right lobes of liver.  MRI of the brain also on October 05, 2020 revealed stable metastatic lesions.  Because of progression of disease, patient discontinued clinical trial and now will initiate single agent gemcitabine on days 1 and 8 with day 15 off.  Will reimage in July 2022.  Proceed with cycle 1, day 8 of treatment today.  Return to clinic in 2 weeks for further  evaluation and consideration of cycle 2, day 1. 2.  Diarrhea: Slightly worse after receiving chemotherapy.  Continue Lomotil as prescribed. 3.  Cough: Chronic and unchanged.  Patient does not complain of this today.  Monitor.   4.  Breast lesion: Benign papilloma.  Patient underwent lumpectomy on March 01, 2019.  Her most recent mammogram on March 03, 2020 was reported as BI-RADS 2.   5.  Anemia: Patient's hemoglobin has trended down to 9.4, monitor. 6.  Renal insufficiency: Resolved. 7.  Hyponatremia: Chronic and unchanged.  Patient's sodium is 133 today. 8.  Leukopenia: Proceed cautiously with chemotherapy as above. 9.  Thrombocytopenia: Mild, monitor.  Patient expressed understanding and was in agreement with this plan. She also understands that She can call clinic at any time with any questions, concerns, or complaints.    Lloyd Huger, MD   10/23/2020 12:18 PM

## 2020-10-19 ENCOUNTER — Other Ambulatory Visit: Payer: Self-pay

## 2020-10-20 ENCOUNTER — Other Ambulatory Visit: Payer: Self-pay | Admitting: Oncology

## 2020-10-20 ENCOUNTER — Other Ambulatory Visit: Payer: Self-pay

## 2020-10-20 ENCOUNTER — Ambulatory Visit: Payer: Medicare Other

## 2020-10-20 MED ORDER — ALPRAZOLAM 0.25 MG PO TABS
0.2500 mg | ORAL_TABLET | Freq: Every evening | ORAL | 0 refills | Status: AC | PRN
  Filled 2020-10-20 (×3): qty 30, 30d supply, fill #0

## 2020-10-21 ENCOUNTER — Other Ambulatory Visit: Payer: Self-pay

## 2020-10-22 ENCOUNTER — Other Ambulatory Visit: Payer: Self-pay

## 2020-10-22 ENCOUNTER — Encounter: Payer: Self-pay | Admitting: Oncology

## 2020-10-22 ENCOUNTER — Inpatient Hospital Stay: Payer: Medicare Other

## 2020-10-22 ENCOUNTER — Inpatient Hospital Stay (HOSPITAL_BASED_OUTPATIENT_CLINIC_OR_DEPARTMENT_OTHER): Payer: Medicare Other | Admitting: Oncology

## 2020-10-22 ENCOUNTER — Encounter (HOSPITAL_COMMUNITY): Payer: Self-pay

## 2020-10-22 VITALS — BP 124/83 | HR 97 | Resp 16

## 2020-10-22 VITALS — BP 112/91 | HR 105 | Temp 98.5°F | Resp 18 | Ht 63.0 in | Wt 119.9 lb

## 2020-10-22 DIAGNOSIS — Z5111 Encounter for antineoplastic chemotherapy: Secondary | ICD-10-CM | POA: Diagnosis not present

## 2020-10-22 DIAGNOSIS — C3490 Malignant neoplasm of unspecified part of unspecified bronchus or lung: Secondary | ICD-10-CM

## 2020-10-22 LAB — COMPREHENSIVE METABOLIC PANEL
ALT: 31 U/L (ref 0–44)
AST: 42 U/L — ABNORMAL HIGH (ref 15–41)
Albumin: 2.9 g/dL — ABNORMAL LOW (ref 3.5–5.0)
Alkaline Phosphatase: 148 U/L — ABNORMAL HIGH (ref 38–126)
Anion gap: 12 (ref 5–15)
BUN: 15 mg/dL (ref 8–23)
CO2: 23 mmol/L (ref 22–32)
Calcium: 9 mg/dL (ref 8.9–10.3)
Chloride: 98 mmol/L (ref 98–111)
Creatinine, Ser: 1 mg/dL (ref 0.44–1.00)
GFR, Estimated: >60 mL/min (ref >60)
Glucose, Bld: 101 mg/dL — ABNORMAL HIGH (ref 70–99)
Potassium: 4.4 mmol/L (ref 3.5–5.1)
Sodium: 133 mmol/L — ABNORMAL LOW (ref 135–145)
Total Bilirubin: 0.5 mg/dL (ref 0.3–1.2)
Total Protein: 7.2 g/dL (ref 6.5–8.1)

## 2020-10-22 LAB — CBC WITH DIFFERENTIAL/PLATELET
Abs Immature Granulocytes: 0.01 10*3/uL (ref 0.00–0.07)
Basophils Absolute: 0 10*3/uL (ref 0.0–0.1)
Basophils Relative: 0 %
Eosinophils Absolute: 0.1 10*3/uL (ref 0.0–0.5)
Eosinophils Relative: 2 %
HCT: 28.7 % — ABNORMAL LOW (ref 36.0–46.0)
Hemoglobin: 9.4 g/dL — ABNORMAL LOW (ref 12.0–15.0)
Immature Granulocytes: 0 %
Lymphocytes Relative: 19 %
Lymphs Abs: 0.5 10*3/uL — ABNORMAL LOW (ref 0.7–4.0)
MCH: 26 pg (ref 26.0–34.0)
MCHC: 32.8 g/dL (ref 30.0–36.0)
MCV: 79.3 fL — ABNORMAL LOW (ref 80.0–100.0)
Monocytes Absolute: 0.3 10*3/uL (ref 0.1–1.0)
Monocytes Relative: 12 %
Neutro Abs: 1.9 10*3/uL (ref 1.7–7.7)
Neutrophils Relative %: 67 %
Platelets: 138 10*3/uL — ABNORMAL LOW (ref 150–400)
RBC: 3.62 MIL/uL — ABNORMAL LOW (ref 3.87–5.11)
RDW: 16.7 % — ABNORMAL HIGH (ref 11.5–15.5)
WBC: 2.9 10*3/uL — ABNORMAL LOW (ref 4.0–10.5)
nRBC: 0.7 % — ABNORMAL HIGH (ref 0.0–0.2)

## 2020-10-22 LAB — MAGNESIUM: Magnesium: 1.9 mg/dL (ref 1.7–2.4)

## 2020-10-22 LAB — PHOSPHORUS: Phosphorus: 3.7 mg/dL (ref 2.5–4.6)

## 2020-10-22 MED ORDER — SODIUM CHLORIDE 0.9 % IV SOLN
Freq: Once | INTRAVENOUS | Status: AC
  Filled 2020-10-22: qty 250

## 2020-10-22 MED ORDER — HEPARIN SOD (PORK) LOCK FLUSH 100 UNIT/ML IV SOLN
INTRAVENOUS | Status: AC
  Filled 2020-10-22: qty 5

## 2020-10-22 MED ORDER — PROCHLORPERAZINE MALEATE 10 MG PO TABS
10.0000 mg | ORAL_TABLET | Freq: Once | ORAL | Status: AC
  Administered 2020-10-22: 10 mg via ORAL
  Filled 2020-10-22: qty 1

## 2020-10-22 MED ORDER — HEPARIN SOD (PORK) LOCK FLUSH 100 UNIT/ML IV SOLN
500.0000 [IU] | Freq: Once | INTRAVENOUS | Status: AC | PRN
  Administered 2020-10-22: 500 [IU]
  Filled 2020-10-22: qty 5

## 2020-10-22 MED ORDER — SODIUM CHLORIDE 0.9 % IV SOLN
1600.0000 mg | Freq: Once | INTRAVENOUS | Status: AC
  Administered 2020-10-22: 1600 mg via INTRAVENOUS
  Filled 2020-10-22: qty 26.3

## 2020-10-23 ENCOUNTER — Inpatient Hospital Stay (HOSPITAL_COMMUNITY)
Admission: RE | Admit: 2020-10-23 | Discharge: 2020-10-23 | Disposition: A | Discharge status: Home / Self Care | Payer: Medicare Other | Source: Ambulatory Visit

## 2020-10-23 ENCOUNTER — Other Ambulatory Visit: Payer: Self-pay

## 2020-10-23 ENCOUNTER — Encounter (HOSPITAL_COMMUNITY): Payer: Self-pay

## 2020-10-28 ENCOUNTER — Encounter: Payer: Self-pay | Admitting: Oncology

## 2020-10-28 NOTE — Patient Instructions (Signed)
t

## 2020-10-28 NOTE — Progress Notes (Signed)
Pt here for VF testing only.  See test and interpretation on infinite.

## 2020-10-29 ENCOUNTER — Other Ambulatory Visit: Payer: Self-pay

## 2020-10-29 ENCOUNTER — Ambulatory Visit (HOSPITAL_COMMUNITY): Payer: Medicare Other | Admitting: Certified Registered"

## 2020-10-29 ENCOUNTER — Encounter (HOSPITAL_COMMUNITY)
Admission: RE | Disposition: A | Discharge status: Home / Self Care | Payer: Self-pay | Source: Ambulatory Visit | Attending: Ophthalmology

## 2020-10-29 ENCOUNTER — Ambulatory Visit (HOSPITAL_BASED_OUTPATIENT_CLINIC_OR_DEPARTMENT_OTHER): Payer: Medicare Other | Admitting: Certified Registered"

## 2020-10-29 ENCOUNTER — Inpatient Hospital Stay
Admission: RE | Admit: 2020-10-29 | Discharge: 2020-10-29 | Disposition: A | Discharge status: Home / Self Care | Payer: Medicare Other | Source: Ambulatory Visit | Attending: Ophthalmology | Admitting: Ophthalmology

## 2020-10-29 ENCOUNTER — Encounter (HOSPITAL_COMMUNITY): Payer: Medicare Other | Admitting: Ophthalmology

## 2020-10-29 DIAGNOSIS — H5340 Unspecified visual field defects: Secondary | ICD-10-CM

## 2020-10-29 DIAGNOSIS — H02834 Dermatochalasis of left upper eyelid: Secondary | ICD-10-CM

## 2020-10-29 DIAGNOSIS — H02831 Dermatochalasis of right upper eyelid: Secondary | ICD-10-CM

## 2020-10-29 HISTORY — PX: HX EYELID SURGERY: 2100001304

## 2020-10-29 SURGERY — BLEPHAROPLASTY BILATERAL
Anesthesia: Monitor Anesthesia Care | Site: Eye | Laterality: Bilateral | Wound class: Clean Wound: Uninfected operative wounds in which no inflammation occurred

## 2020-10-29 MED ORDER — ERYTHROMYCIN 5 MG/GRAM (0.5 %) EYE OINTMENT
TOPICAL_OINTMENT | OPHTHALMIC | 3 refills | Status: DC
Start: 2020-10-29 — End: 2022-04-25

## 2020-10-29 MED ORDER — PROPOFOL 10 MG/ML IV BOLUS
INJECTION | Freq: Once | INTRAVENOUS | Status: DC | PRN
Start: 2020-10-29 — End: 2020-10-29
  Administered 2020-10-29: 40 mg via INTRAVENOUS

## 2020-10-29 MED ORDER — LIDOCAINE (PF) 100 MG/5 ML (2 %) INTRAVENOUS SYRINGE
INJECTION | Freq: Once | INTRAVENOUS | Status: DC | PRN
Start: 2020-10-29 — End: 2020-10-29
  Administered 2020-10-29: 50 mg via INTRAVENOUS

## 2020-10-29 MED ORDER — SODIUM CHLORIDE 0.9 % (FLUSH) INJECTION SYRINGE
2.0000 mL | INJECTION | Freq: Three times a day (TID) | INTRAMUSCULAR | Status: DC
Start: 2020-10-29 — End: 2020-10-29

## 2020-10-29 MED ORDER — MIDAZOLAM (PF) 1 MG/ML INJECTION SOLUTION
Freq: Once | INTRAMUSCULAR | Status: DC | PRN
Start: 2020-10-29 — End: 2020-10-29
  Administered 2020-10-29: 2 mg via INTRAVENOUS

## 2020-10-29 MED ORDER — SODIUM CHLORIDE 0.9 % (FLUSH) INJECTION SYRINGE
2.0000 mL | INJECTION | INTRAMUSCULAR | Status: DC | PRN
Start: 2020-10-29 — End: 2020-10-29

## 2020-10-29 MED ORDER — PROPARACAINE 0.5 % EYE DROPS
1.0000 [drp] | Freq: Once | OPHTHALMIC | Status: DC | PRN
Start: 2020-10-29 — End: 2020-10-29

## 2020-10-29 MED ORDER — KETOROLAC 30 MG/ML (1 ML) INJECTION SOLUTION
Freq: Once | INTRAMUSCULAR | Status: DC | PRN
Start: 2020-10-29 — End: 2020-10-29
  Administered 2020-10-29: 30 mg via INTRAVENOUS

## 2020-10-29 MED ORDER — ONDANSETRON HCL (PF) 4 MG/2 ML INJECTION SOLUTION
Freq: Once | INTRAMUSCULAR | Status: DC | PRN
Start: 2020-10-29 — End: 2020-10-29
  Administered 2020-10-29: 4 mg via INTRAVENOUS

## 2020-10-29 MED ORDER — ERYTHROMYCIN 5 MG/GRAM (0.5 %) EYE OINTMENT
TOPICAL_OINTMENT | Freq: Once | OPHTHALMIC | Status: DC | PRN
Start: 2020-10-29 — End: 2020-10-29

## 2020-10-29 MED ORDER — ERYTHROMYCIN 5 MG/GRAM (0.5 %) EYE OINTMENT
TOPICAL_OINTMENT | OPHTHALMIC | 3 refills | Status: DC
Start: 2020-10-29 — End: 2020-10-29

## 2020-10-29 MED ORDER — LIDOCAINE 20 MG/ML (2 %)-EPINEPHRINE 1:100,000 INJECTION SOLUTION
15.0000 mL | Freq: Once | INTRAMUSCULAR | Status: DC | PRN
Start: 2020-10-29 — End: 2020-10-29
  Administered 2020-10-29: 5 mL via INTRADERMAL

## 2020-10-29 MED ORDER — LACTATED RINGERS INTRAVENOUS SOLUTION
INTRAVENOUS | Status: DC
Start: 2020-10-29 — End: 2020-10-29
  Administered 2020-10-29: 0 mL via INTRAVENOUS

## 2020-10-29 MED ORDER — DEXAMETHASONE SODIUM PHOSPHATE 4 MG/ML INJECTION SOLUTION
Freq: Once | INTRAMUSCULAR | Status: DC | PRN
Start: 2020-10-29 — End: 2020-10-29
  Administered 2020-10-29: 8 mg via INTRAVENOUS

## 2020-10-29 MED ORDER — FENTANYL (PF) 50 MCG/ML INJECTION SOLUTION
Freq: Once | INTRAMUSCULAR | Status: DC | PRN
Start: 2020-10-29 — End: 2020-10-29
  Administered 2020-10-29: 100 ug via INTRAVENOUS

## 2020-10-29 SURGICAL SUPPLY — 27 items
APPLICATOR COT TIP STRL 3IN 9318100 100/CS (WOUND CARE SUPPLY) ×3 IMPLANT
APPLICATOR COT TIP STRL 3IN 9318100 100/CS (WOUND CARE/ENTEROSTOMAL SUPPLY) ×3
ARMBOARD IV FM PSTNR (POSITIONING PRODUCTS) ×2
ARMBRD POSITION 20X8X2IN DVN FOAM (POSITIONING PRODUCTS) ×2 IMPLANT
CLOSURE SKIN STRIPS 1/2X4IN R1547 6/PK 50PK/BX (WOUND CARE/ENTEROSTOMAL SUPPLY) ×1
CONV USE ITEM 337905 - KIT SURG MIN STUP STRL DISP LF (KITS & TRAYS (DISPOSABLE)) ×1 IMPLANT
CORD BIPOLAR 12IN SILVERGLIDE CABLE STRL LF DISP (CAUTERY SUPPLIES) ×1
CORD BIPOLAR 12IN SILVERGLIDE STR CABLE STRL LF  DISP (CAUTERY SUPPLIES) ×1 IMPLANT
COVER WAND RFD STRL 50EA/CS 01-0020 (EQUIPMENT MINOR) ×1
COVER WND RF DETECT STRL CLR EQP (EQUIPMENT MINOR) ×1 IMPLANT
DROPPER MED GLS 3IN 1.5ML STR PIP STRL LF (Cautery Accessories) ×1
DROPPER MED GLS STR STY PIP RUB BULB STRL DISP 3IN (Cautery Accessories) ×1 IMPLANT
KIT MINOR SURG SET UP_CS/28 (KITS & TRAYS (DISPOSABLE)) ×1
KIT SURG MIN STUP STRL DISP LF (KITS & TRAYS (DISPOSABLE)) ×1
LABEL E-Z STICK STLEZP1 100EA/CS (LABELS/CHART SUPPLIES) ×1
LABEL MED EZ PEEL MRKR LF (LABELS/CHART SUPPLIES) ×1 IMPLANT
NEEDLE HYPO  30GA .5IN REG WL PRCSNGL SS POLYPROP REG BVL LL HUB DEHP-FR TAN STRL LF  DISP (NEEDLES & SYRINGE SUPPLIES) ×1 IMPLANT
NEEDLE HYPO 30GA .5IN REG WL PRCSNGL POLYPROP REG BVL LL (NEEDLES & SYRINGE SUPPLIES) ×1
PACK EYE PREP (TRAY) ×1
PACK HEAD NECK_DYNJP7010S 6/CS (DRAPE/PACKS/SHEETS/OR TOWEL) ×1
PACK SURG CSTM EYE PREP STRL DISP LF (TRAY) ×1
PACK SURG CUSTOM EYE PREP STRL DISP LF (TRAY) ×1 IMPLANT
PACK SURG ECLIPSE EENT II TBL CVR SUT BAG HEAD TRBN DRP 90X50IN 40X27IN LF (DRAPE/PACKS/SHEETS/OR TOWEL) ×1 IMPLANT
PACK SURG EENT II DYNJP7010 (DRAPE/PACKS/SHEETS/OR TOWEL) ×1
STRIP 4X.5IN STRSTRP PLSTR REINF SKNCLS WHT STRL LF (WOUND CARE SUPPLY) ×1 IMPLANT
SYRINGE LL 3ML LF  STRL GRAD N-PYRG DEHP-FR PVC FREE MED DISP CLR (NEEDLES & SYRINGE SUPPLIES) ×2 IMPLANT
SYRINGE LL 3ML LF STRL SCL MRK INDIV WRP MED (NEEDLES & SYRINGE SUPPLIES) ×2

## 2020-10-29 NOTE — Anesthesia Transfer of Care (Signed)
ANESTHESIA TRANSFER OF Carrie Schmidt is a 69 y.o. ,female, Weight: 80.5 kg (177 lb 7.5 oz)   had Procedure(s):  BLEPHAROPLASTY BILATERAL  performed  10/29/20   Primary Service: [name], MD    Past Medical History:   Diagnosis Date   . Arthropathy    . Cancer (CMS HCC)     parotid gland removed   . Heart murmur     Diagnosed young, does not follow with cardiologist.    . HTN (hypertension)     Controlled on medications.    . Hyperlipidemia     Controlled on medications.    . Wears glasses       Allergy History as of 10/29/20     IV CONTRAST       Noted Status Severity Type Reaction    01/20/20 1528 Sallee Lange, COA 01/20/20 Active Low  Hives/ Urticaria              I completed my transfer of care / handoff to the receiving personnel during which we discussed:  Access, Airway, All key/critical aspects of case discussed, Analgesia, Antibiotics, Expectation of post procedure, Fluids/Product, Gave opportunity for questions and acknowledgement of understanding, Labs and PMHx                                              Additional Info:Report to RN                        Last OR Temp: Temperature: 36.2 C (97.2 F)  ABG:   Airway:* No LDAs found *  Blood pressure 120/79, pulse 62, temperature 36.2 C (97.2 F), resp. rate 20, height 1.546 m (5' 0.87"), weight 80.5 kg (177 lb 7.5 oz), SpO2 93 %, not currently breastfeeding.

## 2020-10-29 NOTE — Discharge Instructions (Signed)
SURGICAL DISCHARGE INSTRUCTIONS     Dr. Nguyen, John, MD  performed your BLEPHAROPLASTY BILATERAL today at the [facility]    [facility]:  Monday through Friday from 6 a.m. - 7 p.m.: [phone]  Between 7 p.m. - 6 a.m., weekends and holidays:  Call [facility] at [phone] or [phone].    PLEASE SEE WRITTEN HANDOUTS AS DISCUSSED BY YOUR NURSE:      SIGNS AND SYMPTOMS OF A WOUND / INCISION INFECTION   Be sure to watch for the following:   Increase in redness or red streaks near or around the wound or incision.   Increase in pain that is intense or severe and cannot be relieved by the pain medication that your doctor has given you.   Increase in swelling that cannot be relieved by elevation of a body part, or by applying ice, if permitted.   Increase in drainage, or if yellow / green in color and smells bad. This could be on a dressing or a cast.   Increase in fever for longer than 24 hours, or an increase that is higher than 101 degrees Fahrenheit (normal body temperature is 98 degrees Fahrenheit). The incision may feel warm to the touch.    **CALL YOUR DOCTOR IF ONE OR MORE OF THESE SIGNS / SYMPTOMS SHOULD OCCUR.    ANESTHESIA INFORMATION   ANESTHESIA -- ADULT PATIENTS:  You have received intravenous sedation / general anesthesia, and you may feel drowsy and light-headed for several hours. You may even experience some forgetfulness of the procedure. DO NOT DRIVE A MOTOR VEHICLE or perform any activity requiring complete alertness or coordination until you feel fully awake in about 24-48 hours. Do not drink alcoholic beverages for at least 24 hours. Do not stay alone, you must have a responsible adult available to be with you. You may also experience a dry mouth or nausea for 24 hours. This is a normal side effect and will disappear as the effects of the medication wear off.    REMEMBER   If you experience any difficulty breathing, chest pain, bleeding that you feel is  excessive, persistent nausea or vomiting or for any other concerns:  Call your physician Dr. Nguyen at [phone] or [phone]. You may also ask to have the doctor on call paged. They are available to you 24 hours a day.    SPECIAL INSTRUCTIONS / COMMENTS       FOLLOW-UP APPOINTMENTS   Please call patient services at [phone] or [phone] to schedule a date / time of return. They are open Monday - Friday from 7:30 am - 5:00 pm.

## 2020-10-29 NOTE — Anesthesia Postprocedure Evaluation (Signed)
Anesthesia Post Op Evaluation    Patient: Carrie Schmidt  Procedure(s):  BLEPHAROPLASTY BILATERAL    Last Vitals:Temperature: 36.2 C (97.2 F) (10/29/20 1355)  Heart Rate: 69 (10/29/20 1400)  BP (Non-Invasive): 137/87 (10/29/20 1400)  Respiratory Rate: 20 (10/29/20 1400)  SpO2: 93 % (10/29/20 1410)    No complications documented.    Patient is sufficiently recovered from the effects of anesthesia to participate in the evaluation and has returned to their pre-procedure level.  Patient location during evaluation: PACU       Patient participation: complete - patient participated  Level of consciousness: awake and alert    Airway patency: patent    Anesthetic complications: no  Cardiovascular status: acceptable  Respiratory status: acceptable  Hydration status: acceptable  Patient post-procedure temperature: Pt Normothermic   PONV Status: Absent

## 2020-10-29 NOTE — Anesthesia Preprocedure Evaluation (Signed)
ANESTHESIA PRE-OP EVALUATION  Planned Procedure: BLEPHAROPLASTY BILATERAL (Bilateral Eye)  Review of Systems     anesthesia history negative     patient summary reviewed  nursing notes reviewed        Pulmonary  negative pulmonary ROS,    Cardiovascular    Hypertension ,No peripheral edema,  Exercise Tolerance: > or = 4 METS        GI/Hepatic/Renal   negative GI/hepatic/renal ROS,      Endo/Other   neg endo/other ROS,        Neuro/Psych/MS   negative neuro/psych ROS,      Cancer  negative hematology/oncology ROS,                    Physical Assessment      Airway       Mallampati: II    TM distance: >3 FB    Neck ROM: full  Mouth Opening: good.  No Facial hair  No Beard  No endotracheal tube present  No Tracheostomy present    Dental       Dentition intact             Pulmonary    Breath sounds clear to auscultation  (-) no rhonchi, no decreased breath sounds, no wheezes, no rales and no stridor     Cardiovascular    Rhythm: regular  Rate: Normal  (-) no friction rub, carotid bruit is not present, no peripheral edema and no murmur     Other findings  Partials removed           Plan  ASA 2     Planned anesthesia type: MAC                     Intravenous induction     Anesthesia issues/risks discussed are: PONV, Dental Injuries, Post-op Agitation/Tantrum, Blood Loss, Difficult Airway, Cardiac Events/MI, Nerve Injuries, Eye /Visual Loss, Stroke, Sore Throat, Intraoperative Awareness/ Recall, Aspiration and Post-op Intubation/Ventilation.  Anesthetic plan and risks discussed with patient.      Use of blood products discussed with patient who consented to blood products.     Patient's NPO status is appropriate for Anesthesia.           Plan discussed with CRNA.

## 2020-10-29 NOTE — OR Surgeon (Signed)
[place] OF OPHTHALMOLOGY - OPERATIVE NOTE    PATIENT NAME:  [place] NAME:  [id number]  DATE OF SERVICE:  10/29/2020  DATE OF BIRTH:  [birth year]    Pre-Operative Diagnosis: \  1. Bilateral upper lid dermatochalasis  2. Visual field defect    Post-Operative Diagnosis:   1. Bilateral upper lid dermatochalasis  2. Visual field defect    Procedure(s)/Description:    1. Bilateral upper lid blepharoplasty    The patient was brought back to the operating room and a time out was performed. Once correctly identified, marking was carried out for the upper eyelids to identify the full extent of excess skin. The patient was then prepped and draped in the usual sterile ophthalmic fashion. The area marked for ecision was then infiltrated with 2% lidocaine with 1:100,000 epinephrine. A #15 balde was used to incise along the markings. A Westcott scissor was used to excise the skin flap. Hemostasis was achieved with bipolar cautery. A medial opening was made through the septum and the nasal fat pad was identified and then excised with Westcott scissors. No bleeding vessels were noted. The wound was then checked for hemostasis and found to be satisfactory. 6-0 Vicryl sutures were then passed in simple interrupted fashion to close the orbicularis. The wound was then closed with 5-0 fast gut suture in a running fashion. The same procedure was then performed on the other side.     Attending Surgeon: [name], MD  Assistant(s): Delia Chimes, MD; Laurey Morale, MD; Achille Rich (medical student)    Anesthesia Type: MAC, local  Estimated Blood Loss:  <5cc  Blood Given: None  Fluids Given: Per anesthesia  Complications: None  Characteristic Event: None  Did the use of long term anticoagulant impact the outcome of the case? No  Wound Class: Clean  Tubes: None  Drains: None  Specimens/ Cultures: None  Implants: None            Disposition: PACU, then home  Condition: Stable    Laurey Morale, MD   10/29/2020, 13:54  I was present for all key and/or critical portions of the case and immediately available at all times.      [name], MD 10/31/2020, 17:45            [place] NOTE    PATIENT NAME:  [place] NAME:  [id number]  DATE OF SERVICE:  10/29/2020  DATE OF BIRTH:  11/24/1950    Patient meets discharge criteria.  Discharge to home  Keep shield in place.  RTC as scheduled or sooner prn any problem.     Laurey Morale, MD 10/29/2020, 13:54

## 2020-10-29 NOTE — H&P (Signed)
[place]  History and Physical      Date:  10/29/2020   [name], Schmidt, 70 y.o. female  Date of Birth:  August 22, 1950    Complaint:   Dermatochalasis    HPI: Carrie Schmidt is a 70 y.o., female who presents with BUL dermatochalasis.        Past Medical History:   Diagnosis Date   . Arthropathy    . Cancer (CMS HCC)     parotid gland removed   . Heart murmur     Diagnosed young, does not follow with cardiologist.    . HTN (hypertension)     Controlled on medications.    . Hyperlipidemia     Controlled on medications.    . Wears glasses          Allergies   Allergen Reactions   . Contrast Dye [Iv Contrast] Hives/ Urticaria     Current Facility-Administered Medications   Medication Dose Route Frequency Provider Last Rate Last Admin   . erythromycin (ROMYCIN) 0.5% ophthalmic ointment   Both Eyes INTRA-OP Once PRN [name], MD       . lidocaine 2%-EPINEPHrine 1:100,000 injection  15 mL Intradermal INTRA-OP Once PRN [name], MD       . LR premix infusion   Intravenous PRE-OP Continuous [name], MD 30 mL/hr at 10/29/20 1213 New Bag at 10/29/20 1213   . NS flush syringe  2-6 mL Intracatheter Q8HRS [name], MD        And   . NS flush syringe  2-6 mL Intracatheter Q1 MIN PRN [name], MD       . proparacaine (ALCAINE) 0.5% ophthalmic solution  1 Drop Both Eyes INTRA-OP Once PRN [name], MD          Social History     Tobacco Use   . Smoking status: Former Smoker     Quit date: 1972     Years since quitting: 50.3   . Smokeless tobacco: Never Used   Substance Use Topics   . Alcohol use: Yes     Alcohol/week: 1.0 standard drink     Types: 1 Shots of liquor per week     Comment: Occasional.      Family Medical History:     Problem Relation (Age of Onset)    Cancer Maternal Grandmother    Cataract Mother, Father    Diabetes Mother            KGM:WNUUV than ROS in the HPI, all other systems were negative.    EXAM:    General: appears in good health  Eyes: Performed at [facility].    HENT:ENMT without erythema or injection, mucous membranes moist.  Neck: no thyromegaly or lymphadenopathy  Lungs: Clear to auscultation bilaterally.   Cardiovascular: regular rate and rhythm: Soft, non-tender  Extremities: No cyanosis or edema    ASSESSMENT/PLAN:    Appropriate candidate for OR to proceed with blepharoplasty    [name], MD 10/29/2020, 12:50

## 2020-10-31 NOTE — Progress Notes (Signed)
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  INTERVAL HISTORY: Patient returns to clinic today for further evaluation and consideration of cycle 2, day 1 of single agent gemcitabine.  Her weakness and fatigue have improved and she no longer has nausea or diarrhea. She has no neurologic complaints.  She denies any fevers.  She has a good appetite and denies weight loss.  She denies any chest pain, shortness of breath, cough, or hemoptysis.  She denies any nausea, vomiting, constipation, or diarrhea.  She has no urinary complaints.  Patient offers no further specific complaints today.  REVIEW OF SYSTEMS:   Review of Systems  Constitutional: Positive for malaise/fatigue. Negative for fever and weight loss.  Respiratory: Negative.  Negative for cough, hemoptysis and shortness of breath.   Cardiovascular: Negative.  Negative for chest pain and leg swelling.  Gastrointestinal: Negative.  Negative for abdominal pain, constipation, diarrhea and nausea.  Genitourinary: Negative.  Negative for dysuria and flank pain.  Musculoskeletal: Negative for back pain and joint pain.  Skin: Negative.  Negative for rash.  Neurological: Positive for weakness. Negative for dizziness, sensory change, focal weakness and headaches.  Psychiatric/Behavioral: Negative.  The patient is not nervous/anxious.     As per HPI. Otherwise, a complete review of systems is negative.  PAST MEDICAL HISTORY: Past Medical History:  Diagnosis Date  . Abnormal echocardiogram   . Acid reflux   . Allergic rhinitis   . Anemia    HAD TO HAVE IRON INFUSIONS BACK IN 2015  . Arthritis    bil hands and back  . Asthma   . Bloody  discharge from left nipple   . Cancer of right lung ([place]) 2015   lung cancer - chemo, Dr Genevive Bi removed RML Lobectomy.   . Colon polyps   . COPD (chronic obstructive pulmonary disease) ([place])   . Dyspnea    WITH EXERTION  . Family history of breast cancer   . Family history of cervical cancer   . Family history of colon cancer   . Family history of skin cancer   . Hypertension   . Malignant neoplasm of middle lobe of right lung ([place])   . Mitral valve prolapse   . Mitral valve prolapse   . Non-small cell lung cancer ([place])   . Personal history of chemotherapy    F/U Lung Cancer  . Personal history of radiation therapy    Lung cancer  . Primary cancer of right middle lobe of lung ([place]) 03/11/2016  . Squamous cell carcinoma of skin 07/07/2009   right supraclavicular/EDC  . Squamous cell carcinoma of skin 11/04/2019   Left pretibial. [facility].  . Stage IV adenocarcinoma of lung, unspecified laterality ([place])     PAST SURGICAL HISTORY: Past Surgical History:  Procedure Laterality Date  . ABDOMINAL HYSTERECTOMY    . BAND HEMORRHOIDECTOMY    . BREAST BIOPSY Left 03/01/2019   Procedure: LEFT BREAST EXCISIONAL BIOPSY WITH NEEDLE LOCALIZATION;  Surgeon: Herbert Pun, MD;  Location: [facility] ORS;  Service: General;  Laterality: Left;  . BREAST EXCISIONAL BIOPSY Left 1994 (-), 2004 (-)   benign  . BREAST EXCISIONAL BIOPSY Left 03/01/2019   - INTRADUCTAL PAPILLOMA WITH SCLEROSIS.   [facility] COLONOSCOPY  2008  Dr. Donnella Sham  . COLONOSCOPY WITH PROPOFOL N/A 04/12/2019   Procedure: COLONOSCOPY WITH PROPOFOL;  Surgeon: Lollie Sails, MD;  Location: [facility] ENDOSCOPY;  Service: Endoscopy;  Laterality: N/A;  . IR RADIOLOGIST EVAL & MGMT  08/28/2019  . IR RADIOLOGIST EVAL & MGMT  10/30/2019  . IR RADIOLOGIST EVAL & MGMT  11/26/2019  . KNEE SURGERY  2007  . LUNG LOBECTOMY Right    middle lobe  . PORTACATH PLACEMENT Right 02/09/2017   Procedure: INSERTION PORT-A-CATH;  Surgeon: Nestor Lewandowsky, MD;   Location: [facility] ORS;  Service: General;  Laterality: Right;  . RADIOLOGY WITH ANESTHESIA N/A 10/02/2019   Procedure: MICROWAVE THERMA ABLATION;  Surgeon: Aletta Edouard, MD;  Location: [facility] ORS;  Service: Radiology;  Laterality: N/A;  . SALIVARY GLAND SURGERY Left 1983   with lymph node removal also  . SALIVARY GLAND SURGERY     salivary gland removal   . UPPER GI ENDOSCOPY  2008  . VIDEO BRONCHOSCOPY WITH ENDOBRONCHIAL ULTRASOUND Right 01/24/2017   Procedure: VIDEO BRONCHOSCOPY WITH ENDOBRONCHIAL ULTRASOUND;  Surgeon: [name], MD;  Location: [facility] ORS;  Service: Pulmonary;  Laterality: Right;    FAMILY HISTORY: Family History  Problem Relation Age of Onset  . Breast cancer Maternal Aunt 60  . Alzheimer's disease Maternal Aunt   . Breast cancer Paternal Aunt        dx. in her late 63s  . Breast cancer Maternal Grandmother        dx. in her 30s  . Cervical cancer Maternal Grandmother 60  . Colon cancer Maternal Grandmother        dx. in her 73s  . Breast cancer Maternal Aunt 60  . Alzheimer's disease Maternal Aunt   . Breast cancer Paternal Aunt        dx. in her late 57s  . Heart disease Paternal Aunt   . Healthy Mother   . Healthy Father   . Colon cancer Father 28  . Skin cancer Father   . Heart attack Father   . Heart attack Paternal Grandfather   . Skin cancer Paternal Aunt   . Heart attack Paternal Uncle 35  . Heart attack Paternal Uncle     ADVANCED DIRECTIVES (Y/N):  N  HEALTH MAINTENANCE: Social History   Tobacco Use  . Smoking status: Never Smoker  . Smokeless tobacco: Never Used  Vaping Use  . Vaping Use: Never used  Substance Use Topics  . Alcohol use: Yes    Alcohol/week: 7.0 standard drinks    Types: 7 Glasses of wine per week  . Drug use: No     Colonoscopy:  PAP:  Bone density:  Lipid panel:  Allergies  Allergen Reactions  . Other Other (See Comments)    Cat gut sutures get infected Cat gut sutures get infected  . Penicillins  Swelling and Rash    Did it involve swelling of the face/tongue/throat, SOB, or low BP? Unknown Did it involve sudden or severe rash/hives, skin peeling, or any reaction on the inside of your mouth or nose? No Did you need to seek medical attention at a hospital or doctor's office? Yes When did it last happen?30 + years  If all above answers are "NO", may proceed with cephalosporin use.    . Sulfa Antibiotics Nausea And Vomiting    Current Outpatient Medications  Medication Sig Dispense Refill  . albuterol (VENTOLIN HFA) 108 (90 Base) MCG/ACT inhaler Inhale 1-2 puffs into the lungs every 6 (six) hours as needed  for wheezing or shortness of breath. 1 g 10  . ALPRAZolam (XANAX) 0.25 MG tablet Take 1 tablet (0.25 mg total) by mouth at bedtime as needed for anxiety. 30 tablet 0  . ascorbic acid (VITAMIN C) 100 MG tablet Take 100 mg by mouth daily.     . B Complex Vitamins (VITAMIN B COMPLEX PO) Take 1 tablet by mouth daily.     . Biotin w/ Vitamins C & E (HAIR/SKIN/NAILS PO) Take 1 tablet by mouth daily.    . Calcium Carb-Cholecalciferol 600-800 MG-UNIT TABS Take 2 tablets by mouth daily.    . Cholecalciferol 125 MCG (5000 UT) capsule Take 5,000 Units by mouth daily.    . clobetasol ointment (TEMOVATE) 5.91 % Apply 1 application topically 2 (two) times daily. Dry area and apply to mouth sores twice daily as needed for up to 2 weeks. 15 g 0  . COLLAGEN PO Take 1,000 mg by mouth daily.    . cyclobenzaprine (FLEXERIL) 5 MG tablet Take 1 tablet (5 mg total) by mouth 3 (three) times daily as needed for muscle spasms. 30 tablet 0  . fexofenadine (ALLEGRA) 180 MG tablet Take 180 mg by mouth daily as needed for allergies.     . fluticasone (FLONASE) 50 MCG/ACT nasal spray Place 2 sprays into both nostrils daily as needed for allergies or rhinitis.    . Fluticasone-Salmeterol (ADVAIR) 250-50 MCG/DOSE AEPB INHALE 1 PUFF BY MOUTH 2 TIMES DAILY AS NEEDED FOR BREATHING ISSUES 60 each 10  . ibuprofen  (ADVIL) 800 MG tablet TAKE 1 TABLET BY MOUTH EVERY 8 HOURS AS NEEDED FOR PAIN **ALWAYS TAKE WITH FOOD** 90 tablet 2  . lansoprazole (PREVACID) 30 MG capsule TAKE 1 CAPSULE BY MOUTH TWICE DAILY 180 capsule 3  . lidocaine-prilocaine (EMLA) cream Apply 1 application topically as needed. Apply generously over the Mediport 45 minutes prior to chemotherapy. 30 g 0  . LORazepam (ATIVAN) 0.5 MG tablet DISSOLVE 1 TABLET UNDER TONGUE 30 MINUTES PRIOR TO CT OR MRI SCANS AS NEEDED. 5 tablet 0  . ondansetron (ZOFRAN) 8 MG tablet Take 1 tablet (8 mg total) by mouth every 8 (eight) hours as needed for nausea or vomiting (start 3 days; after chemo). 40 tablet 1  . osimertinib mesylate (TAGRISSO) 80 MG tablet Take by mouth.    . polyethylene glycol (MIRALAX / GLYCOLAX) packet Take 17 g by mouth daily as needed for mild constipation.    . prochlorperazine (COMPAZINE) 10 MG tablet TAKE 1 TABLET (10 MG TOTAL) BY MOUTH EVERY 6 (SIX) HOURS AS NEEDED FOR NAUSEA OR VOMITING. 40 tablet 1  . triamcinolone (KENALOG) 0.1 % paste Use as directed 1 application in the mouth or throat 2 (two) times daily. 15 g 12  . Vitamin D, Ergocalciferol, (DRISDOL) 1.25 MG (50000 UNIT) CAPS capsule TAKE 1 CAPSULE (50,000 UNITS TOTAL) BY MOUTH ONCE A WEEK 12 capsule 3  . zinc gluconate 50 MG tablet Take 50 mg by mouth daily.    . magic mouthwash SOLN Take 5-10 mLs by mouth 4 (four) times daily as needed for mouth pain. (Patient not taking: No sig reported) 480 mL 0   No current facility-administered medications for this visit.   Facility-Administered Medications Ordered in Other Visits  Medication Dose Route Frequency Provider Last Rate Last Admin  . heparin lock flush 100 unit/mL  500 Units Intravenous Once Lloyd Huger, MD      . sodium chloride flush (NS) 0.9 % injection 10 mL  10 mL Intravenous  PRN Lloyd Huger, MD      . sodium chloride flush (NS) 0.9 % injection 10 mL  10 mL Intravenous PRN Lloyd Huger, MD   10 mL  at 01/22/18 1419    OBJECTIVE: Vitals:   11/05/20 1324  BP: 121/83  Pulse: (!) 105  Resp: 20  Temp: 98.7 F (37.1 C)  SpO2: 100%     Body mass index is 20.89 kg/m.    ECOG FS:1 - Symptomatic but completely ambulatory  General: Well-developed, well-nourished, no acute distress. Eyes: Pink conjunctiva, anicteric sclera. HEENT: Normocephalic, moist mucous membranes. Lungs: No audible wheezing or coughing. Heart: Regular rate and rhythm. Abdomen: Soft, nontender, no obvious distention. Musculoskeletal: No edema, cyanosis, or clubbing. Neuro: Alert, answering all questions appropriately. Cranial nerves grossly intact. Skin: No rashes or petechiae noted. Psych: Normal affect.  LAB RESULTS:  Lab Results  Component Value Date   NA 134 (L) 11/05/2020   K 4.6 11/05/2020   CL 100 11/05/2020   CO2 25 11/05/2020   GLUCOSE 104 (H) 11/05/2020   BUN 17 11/05/2020   CREATININE 0.96 11/05/2020   CALCIUM 9.0 11/05/2020   PROT 7.1 11/05/2020   ALBUMIN 3.2 (L) 11/05/2020   AST 25 11/05/2020   ALT 18 11/05/2020   ALKPHOS 131 (H) 11/05/2020   BILITOT 0.4 11/05/2020   GFRNONAA >60 11/05/2020   GFRAA >60 03/23/2020    Lab Results  Component Value Date   WBC 4.7 11/05/2020   NEUTROABS 3.4 11/05/2020   HGB 9.1 (L) 11/05/2020   HCT 28.7 (L) 11/05/2020   MCV 82.0 11/05/2020   PLT 518 (H) 11/05/2020     STUDIES: No results found.  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  She initially received 4 cycles of adjuvant cisplatin and Alimta completing on January 23, 2014.  Patient was on the [facility] protocol for maintenance Tarceva versus placebo, but discontinued treatment in November 2015 because of significant side effects.  Although blinded, presumption was she was receiving Tarceva.  She recently was noted to have a recurrence confirmed by right paratracheal lymph node biopsy on January 24, 2017.  PET scan on February 03, 2017 did not reveal any distant metastasis.  She underwent treatment with weekly carboplatinum and Taxol along with daily XRT finishing in October 2018.  She initiated maintenance durvalumab on June 29, 2017.  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  Patient received Tagrisso from April 2019 through September 2021. PET scan results from July 09, 2019 revealed mild progression of disease in her lungs with an isolated left kidney metastasis that has undergone ablation. PET scan on March 11, 2020 revealed continued progression of disease in her lungs.    She subsequently discontinued Tagrisso and initiated single agent pemetrexed on March 23, 2020.    Patient enrolled in clinical trial at [facility] between December 2021 and April 2022.  She initiated single agent gemcitabine on October 15, 2020.   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  PLAN:   1. 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  Patient's most recent imaging with CT [facility] at [facility] on October 05, 2020 revealed new and increasing size of bilateral pulmonary nodules, bilateral axillary and left supraclavicular lymphadenopathy, and interval enlargement of multiple lesions throughout left and right lobes of liver.  MRI of the  brain also on October 05, 2020 revealed stable metastatic lesions.  Because of progression of disease, patient discontinued clinical trial and now will initiate single agent gemcitabine on days 1 and 8 with day 15 off.  Will reimage in July 2022.  Proceed with cycle 2, day 1 of treatment today.  Return to clinic in 1 week for further evaluation and consideration of cycle 2, day 8.   2.  Diarrhea: Resolved.  Continue Lomotil as needed. 3.  Cough: Chronic and unchanged.  Patient does not complain of this today.  Monitor.   4.  Breast lesion: Benign papilloma.  Patient  underwent lumpectomy on March 01, 2019.  Her most recent mammogram on March 03, 2020 was reported as BI-RADS 2.   5.  Anemia: Patient's hemoglobin continues to slowly trend down and is now 9.1.  Monitor. 6.  Renal insufficiency: Resolved. 7.  Hyponatremia: Chronic and unchanged.  Patient sodium is 134 today. 8.  Leukopenia: Resolved. 9.  Thrombocytosis: Mild, monitor. 10.  Nausea: Continue Compazine as needed.  Patient expressed understanding and was in agreement with this plan. She also understands that She can call clinic at any time with any questions, concerns, or complaints.    Lloyd Huger, MD   11/05/2020 1:53 PM

## 2020-11-05 ENCOUNTER — Inpatient Hospital Stay (HOSPITAL_BASED_OUTPATIENT_CLINIC_OR_DEPARTMENT_OTHER): Payer: Medicare Other | Admitting: Oncology

## 2020-11-05 ENCOUNTER — Inpatient Hospital Stay: Payer: Medicare Other

## 2020-11-05 ENCOUNTER — Inpatient Hospital Stay: Payer: Medicare Other | Attending: Oncology

## 2020-11-05 ENCOUNTER — Other Ambulatory Visit: Payer: Self-pay

## 2020-11-05 ENCOUNTER — Encounter: Payer: Self-pay | Admitting: Oncology

## 2020-11-05 VITALS — BP 121/83 | HR 105 | Temp 98.7°F | Resp 20 | Wt 117.9 lb

## 2020-11-05 DIAGNOSIS — D649 Anemia, unspecified: Secondary | ICD-10-CM | POA: Insufficient documentation

## 2020-11-05 DIAGNOSIS — Z5111 Encounter for antineoplastic chemotherapy: Secondary | ICD-10-CM | POA: Insufficient documentation

## 2020-11-05 DIAGNOSIS — N289 Disorder of kidney and ureter, unspecified: Secondary | ICD-10-CM | POA: Diagnosis not present

## 2020-11-05 DIAGNOSIS — C7902 Secondary malignant neoplasm of left kidney and renal pelvis: Secondary | ICD-10-CM | POA: Insufficient documentation

## 2020-11-05 DIAGNOSIS — C7951 Secondary malignant neoplasm of bone: Secondary | ICD-10-CM | POA: Insufficient documentation

## 2020-11-05 DIAGNOSIS — C3490 Malignant neoplasm of unspecified part of unspecified bronchus or lung: Secondary | ICD-10-CM

## 2020-11-05 DIAGNOSIS — C787 Secondary malignant neoplasm of liver and intrahepatic bile duct: Secondary | ICD-10-CM | POA: Diagnosis not present

## 2020-11-05 DIAGNOSIS — R110 Nausea: Secondary | ICD-10-CM | POA: Insufficient documentation

## 2020-11-05 DIAGNOSIS — C778 Secondary and unspecified malignant neoplasm of lymph nodes of multiple regions: Secondary | ICD-10-CM | POA: Insufficient documentation

## 2020-11-05 DIAGNOSIS — C342 Malignant neoplasm of middle lobe, bronchus or lung: Secondary | ICD-10-CM | POA: Diagnosis present

## 2020-11-05 DIAGNOSIS — C7931 Secondary malignant neoplasm of brain: Secondary | ICD-10-CM | POA: Diagnosis not present

## 2020-11-05 LAB — CBC WITH DIFFERENTIAL/PLATELET
Abs Immature Granulocytes: 0.02 10*3/uL (ref 0.00–0.07)
Basophils Absolute: 0.1 10*3/uL (ref 0.0–0.1)
Basophils Relative: 1 %
Eosinophils Absolute: 0.2 10*3/uL (ref 0.0–0.5)
Eosinophils Relative: 4 %
HCT: 28.7 % — ABNORMAL LOW (ref 36.0–46.0)
Hemoglobin: 9.1 g/dL — ABNORMAL LOW (ref 12.0–15.0)
Immature Granulocytes: 0 %
Lymphocytes Relative: 13 %
Lymphs Abs: 0.6 10*3/uL — ABNORMAL LOW (ref 0.7–4.0)
MCH: 26 pg (ref 26.0–34.0)
MCHC: 31.7 g/dL (ref 30.0–36.0)
MCV: 82 fL (ref 80.0–100.0)
Monocytes Absolute: 0.5 10*3/uL (ref 0.1–1.0)
Monocytes Relative: 10 %
Neutro Abs: 3.4 10*3/uL (ref 1.7–7.7)
Neutrophils Relative %: 72 %
Platelets: 518 10*3/uL — ABNORMAL HIGH (ref 150–400)
RBC: 3.5 MIL/uL — ABNORMAL LOW (ref 3.87–5.11)
RDW: 18.9 % — ABNORMAL HIGH (ref 11.5–15.5)
WBC: 4.7 10*3/uL (ref 4.0–10.5)
nRBC: 0 % (ref 0.0–0.2)

## 2020-11-05 LAB — COMPREHENSIVE METABOLIC PANEL
ALT: 18 U/L (ref 0–44)
AST: 25 U/L (ref 15–41)
Albumin: 3.2 g/dL — ABNORMAL LOW (ref 3.5–5.0)
Alkaline Phosphatase: 131 U/L — ABNORMAL HIGH (ref 38–126)
Anion gap: 9 (ref 5–15)
BUN: 17 mg/dL (ref 8–23)
CO2: 25 mmol/L (ref 22–32)
Calcium: 9 mg/dL (ref 8.9–10.3)
Chloride: 100 mmol/L (ref 98–111)
Creatinine, Ser: 0.96 mg/dL (ref 0.44–1.00)
GFR, Estimated: >60 mL/min (ref >60)
Glucose, Bld: 104 mg/dL — ABNORMAL HIGH (ref 70–99)
Potassium: 4.6 mmol/L (ref 3.5–5.1)
Sodium: 134 mmol/L — ABNORMAL LOW (ref 135–145)
Total Bilirubin: 0.4 mg/dL (ref 0.3–1.2)
Total Protein: 7.1 g/dL (ref 6.5–8.1)

## 2020-11-05 LAB — PHOSPHORUS: Phosphorus: 4.1 mg/dL (ref 2.5–4.6)

## 2020-11-05 LAB — MAGNESIUM: Magnesium: 2.1 mg/dL (ref 1.7–2.4)

## 2020-11-05 MED ORDER — HEPARIN SOD (PORK) LOCK FLUSH 100 UNIT/ML IV SOLN
500.0000 [IU] | Freq: Once | INTRAVENOUS | Status: AC | PRN
Start: 2020-11-05 — End: 2020-11-05
  Administered 2020-11-05: 500 [IU]
  Filled 2020-11-05: qty 5

## 2020-11-05 MED ORDER — SODIUM CHLORIDE 0.9 % IV SOLN
Freq: Once | INTRAVENOUS | Status: AC
  Filled 2020-11-05: qty 250

## 2020-11-05 MED ORDER — SODIUM CHLORIDE 0.9 % IV SOLN
1600.0000 mg | Freq: Once | INTRAVENOUS | Status: AC
  Administered 2020-11-05: 1600 mg via INTRAVENOUS
  Filled 2020-11-05: qty 26.3

## 2020-11-05 MED ORDER — HEPARIN SOD (PORK) LOCK FLUSH 100 UNIT/ML IV SOLN
INTRAVENOUS | Status: AC
  Filled 2020-11-05: qty 5

## 2020-11-05 MED ORDER — PROCHLORPERAZINE MALEATE 10 MG PO TABS
10.0000 mg | ORAL_TABLET | Freq: Four times a day (QID) | ORAL | 2 refills | Status: AC | PRN
  Filled 2020-11-05: qty 60, 15d supply, fill #0
  Filled 2021-01-25: qty 60, 15d supply, fill #1

## 2020-11-05 MED ORDER — SODIUM CHLORIDE 0.9% FLUSH
10.0000 mL | INTRAVENOUS | Status: DC | PRN
  Filled 2020-11-05: qty 10

## 2020-11-05 MED ORDER — PROCHLORPERAZINE MALEATE 10 MG PO TABS
10.0000 mg | ORAL_TABLET | Freq: Once | ORAL | Status: AC
Start: 2020-11-05 — End: 2020-11-05
  Administered 2020-11-05: 10 mg via ORAL
  Filled 2020-11-05: qty 1

## 2020-11-05 NOTE — Progress Notes (Signed)
HR 105. Per MD ok to treat with HR and port status

## 2020-11-05 NOTE — Progress Notes (Signed)
Port accessed, flushes easily with brief positional blood return - unable to obtain labs from port today. Blood drawn peripherally, remains accessed for treatment. MD and team notified.

## 2020-11-05 NOTE — Patient Instructions (Signed)
[facility] ONCOLOGY  Discharge Instructions: Thank you for choosing [facility] to provide your oncology and hematology care.  If you have a lab appointment with the [facility], please go directly to the [facility] and check in at the registration area.  Wear comfortable clothing and clothing appropriate for easy access to any Portacath or PICC line.   We strive to give you quality time with your provider. You may need to reschedule your appointment if you arrive late (15 or more minutes).  Arriving late affects you and other patients whose appointments are after yours.  Also, if you miss three or more appointments without notifying the office, you may be dismissed from the clinic at the provider's discretion.      For prescription refill requests, have your pharmacy contact our office and allow 72 hours for refills to be completed.    Today you received the following chemotherapy and/or immunotherapy agents - gemcitabine      To help prevent nausea and vomiting after your treatment, we encourage you to take your nausea medication as directed.  BELOW ARE SYMPTOMS THAT SHOULD BE REPORTED IMMEDIATELY: . *FEVER GREATER THAN 100.4 F (38 C) OR HIGHER . *CHILLS OR SWEATING . *NAUSEA AND VOMITING THAT IS NOT CONTROLLED WITH YOUR NAUSEA MEDICATION . *UNUSUAL SHORTNESS OF BREATH . *UNUSUAL BRUISING OR BLEEDING . *URINARY PROBLEMS (pain or burning when urinating, or frequent urination) . *BOWEL PROBLEMS (unusual diarrhea, constipation, pain near the anus) . TENDERNESS IN MOUTH AND THROAT WITH OR WITHOUT PRESENCE OF ULCERS (sore throat, sores in mouth, or a toothache) . UNUSUAL RASH, SWELLING OR PAIN  . UNUSUAL VAGINAL DISCHARGE OR ITCHING   Items with * indicate a potential emergency and should be followed up as soon as possible or go to the Emergency Department if any problems should occur.  Please show the CHEMOTHERAPY ALERT CARD or IMMUNOTHERAPY  ALERT CARD at check-in to the Emergency Department and triage nurse.  Should you have questions after your visit or need to cancel or reschedule your appointment, please contact [facility]  [phone] and follow the prompts.  Office hours are 8:00 a.m. to 4:30 p.m. Monday - Friday. Please note that voicemails left after 4:00 p.m. may not be returned until the following business day.  We are closed weekends and major holidays. You have access to a nurse at all times for urgent questions. Please call the main number to the clinic [phone] and follow the prompts.  For any non-urgent questions, you may also contact your provider using MyChart. We now offer e-Visits for anyone 50 and older to request care online for non-urgent symptoms. For details visit [email or web address].   Also download the MyChart app! Go to the app store, search "MyChart", open the app, select [facility], and log in with your MyChart username and password.  Due to Covid, a mask is required upon entering the hospital/clinic. If you do not have a mask, one will be given to you upon arrival. For doctor visits, patients may have 1 support person aged 55 or older with them. For treatment visits, patients cannot have anyone with them due to current Covid guidelines and our immunocompromised population.   Gemcitabine injection What is this medicine? GEMCITABINE (jem SYE ta been) is a chemotherapy drug. This medicine is used to treat many types of cancer like breast cancer, lung cancer, pancreatic cancer, and ovarian cancer. This medicine may be used for other purposes; ask  your health care provider or pharmacist if you have questions. COMMON BRAND NAME(S): Gemzar, Infugem What should I tell my health care provider before I take this medicine? They need to know if you have any of these conditions:  blood disorders  infection  kidney disease  liver disease  lung or breathing disease,  like asthma  recent or ongoing radiation therapy  an unusual or allergic reaction to gemcitabine, other chemotherapy, other medicines, foods, dyes, or preservatives  pregnant or trying to get pregnant  breast-feeding How should I use this medicine? This drug is given as an infusion into a vein. It is administered in a hospital or clinic by a specially trained health care professional. Talk to your pediatrician regarding the use of this medicine in children. Special care may be needed. Overdosage: If you think you have taken too much of this medicine contact a poison control center or emergency room at once. NOTE: This medicine is only for you. Do not share this medicine with others. What if I miss a dose? It is important not to miss your dose. Call your doctor or health care professional if you are unable to keep an appointment. What may interact with this medicine?  medicines to increase blood counts like filgrastim, pegfilgrastim, sargramostim  some other chemotherapy drugs like cisplatin  vaccines Talk to your doctor or health care professional before taking any of these medicines:  acetaminophen  aspirin  ibuprofen  ketoprofen  naproxen This list may not describe all possible interactions. Give your health care provider a list of all the medicines, herbs, non-prescription drugs, or dietary supplements you use. Also tell them if you smoke, drink alcohol, or use illegal drugs. Some items may interact with your medicine. What should I watch for while using this medicine? Visit your doctor for checks on your progress. This drug may make you feel generally unwell. This is not uncommon, as chemotherapy can affect healthy cells as well as cancer cells. Report any side effects. Continue your course of treatment even though you feel ill unless your doctor tells you to stop. In some cases, you may be given additional medicines to help with side effects. Follow all directions for their  use. Call your doctor or health care professional for advice if you get a fever, chills or sore throat, or other symptoms of a cold or flu. Do not treat yourself. This drug decreases your body's ability to fight infections. Try to avoid being around people who are sick. This medicine may increase your risk to bruise or bleed. Call your doctor or health care professional if you notice any unusual bleeding. Be careful brushing and flossing your teeth or using a toothpick because you may get an infection or bleed more easily. If you have any dental work done, tell your dentist you are receiving this medicine. Avoid taking products that contain aspirin, acetaminophen, ibuprofen, naproxen, or ketoprofen unless instructed by your doctor. These medicines may hide a fever. Do not become pregnant while taking this medicine or for 6 months after stopping it. Women should inform their doctor if they wish to become pregnant or think they might be pregnant. Men should not father a child while taking this medicine and for 3 months after stopping it. There is a potential for serious side effects to an unborn child. Talk to your health care professional or pharmacist for more information. Do not breast-feed an infant while taking this medicine or for at least 1 week after stopping it. Men should  inform their doctors if they wish to father a child. This medicine may lower sperm counts. Talk with your doctor or health care professional if you are concerned about your fertility. What side effects may I notice from receiving this medicine? Side effects that you should report to your doctor or health care professional as soon as possible:  allergic reactions like skin rash, itching or hives, swelling of the face, lips, or tongue  breathing problems  pain, redness, or irritation at site where injected  signs and symptoms of a dangerous change in heartbeat or heart rhythm like chest pain; dizziness; fast or irregular  heartbeat; palpitations; feeling faint or lightheaded, falls; breathing problems  signs of decreased platelets or bleeding - bruising, pinpoint red spots on the skin, black, tarry stools, blood in the urine  signs of decreased red blood cells - unusually weak or tired, feeling faint or lightheaded, falls  signs of infection - fever or chills, cough, sore throat, pain or difficulty passing urine  signs and symptoms of kidney injury like trouble passing urine or change in the amount of urine  signs and symptoms of liver injury like dark yellow or brown urine; general ill feeling or flu-like symptoms; light-colored stools; loss of appetite; nausea; right upper belly pain; unusually weak or tired; yellowing of the eyes or skin  swelling of ankles, feet, hands Side effects that usually do not require medical attention (report to your doctor or health care professional if they continue or are bothersome):  constipation  diarrhea  hair loss  loss of appetite  nausea  rash  vomiting This list may not describe all possible side effects. Call your doctor for medical advice about side effects. You may report side effects to FDA at 1-800-FDA-1088. Where should I keep my medicine? This drug is given in a hospital or clinic and will not be stored at home. NOTE: This sheet is a summary. It may not cover all possible information. If you have questions about this medicine, talk to your doctor, pharmacist, or health care provider.  2021 Elsevier/Gold Standard (2017-09-13 18:06:11)

## 2020-11-06 ENCOUNTER — Encounter (INDEPENDENT_AMBULATORY_CARE_PROVIDER_SITE_OTHER): Payer: Self-pay | Admitting: Ophthalmology

## 2020-11-06 ENCOUNTER — Other Ambulatory Visit: Payer: Self-pay

## 2020-11-06 ENCOUNTER — Ambulatory Visit (HOSPITAL_COMMUNITY)
Admission: RE | Admit: 2020-11-06 | Discharge: 2020-11-06 | Disposition: A | Discharge status: Home / Self Care | Payer: Medicare Other | Source: Ambulatory Visit

## 2020-11-06 ENCOUNTER — Ambulatory Visit: Payer: Medicare Other | Attending: Ophthalmology | Admitting: Ophthalmology

## 2020-11-06 DIAGNOSIS — Z9889 Other specified postprocedural states: Secondary | ICD-10-CM

## 2020-11-06 NOTE — Progress Notes (Addendum)
[facility]  [place] [address]  Operated by [facility] Note    11/06/2020    Patient Name: Carrie Schmidt    Date of Birth:  07/02/1951    Referring Provider:  No referring provider defined for this encounter.    Ophthalmologist/Optometrist:      Chief Complaint     Post-op Eye          HPI     Post-op Eye     In both eyes.  Treatments tried include antibiotic ointment.  Treatment compliance is always. Additional comments: S/P 10/29/20 BUL bleph                Comments     1wk POV for S/P 10/29/20 BUL bleph  Pt states VA is doing well  Denies pain, discharge, fol, floaters  Icing  Ung as directed            Last edited by Matilde Sprang, [place] on 11/06/2020  2:30 PM. (History)          ROS     Positive for: Eyes (S/P 10/29/20 BUL bleph)    Negative for: Constitutional, Gastrointestinal, Neurological, Skin, Genitourinary, Musculoskeletal, HENT, Endocrine, Cardiovascular, Respiratory, Psychiatric, Allergic/Imm, Heme/Lymph    Last edited by Matilde Sprang, [place] on 11/06/2020  2:30 PM. (History)          History of Sun Exposure:no If yes:   Excessive sun burn:   History radiation to head:   History of skin cancer:        Past Ocular history:   Past Surgical History:   Procedure Laterality Date   . BLEPHAROPLASTY BILATERAL Bilateral 10/29/2020    Performed by [name], MD at [facility]   . CESAREAN SECTION      X2   . EXTRACTION CATARACT  COMPLEX Left 08/05/2020    Performed by Aileen Pilot, MD at [facility]   . FOOT FRACTURE SURGERY     . HX CATARACT REMOVAL Left 08/05/2020    Dr. Juliane Lack   . HX EYELID SURGERY Bilateral 10/29/2020    S/P 10/29/20 BUL bleph   . KNEE ARTHROSCOPY Left    . Experiment, [place] 11/06/2020, 14:59    MD Addition to HPI:    The patient is a 70 y.o. female here with complaint of:    POW#1 f/u for s/p bleph BUL (10/29/20). Pt endorses dryness. Denies pain, discharge,  FOL, floaters. Pt is icing PRN and using ung as directed.     Assessment     Impression(s):      ICD-10-CM    1. Postsurgical state, eye  Z98.890 OPH EXTERNAL PHOTOS       Plan(s)/Recommendation(s):    s/p bleph BUL (10/29/20)  -trace edema  -healing well for POW#1  -rec ung noct TID for 1 week  -rec Vaseline for skin  -makeup is ok  -no restrictions - no rubbing eye  -RTC 6-8 weeks    NSC OD/PCIOL OS  No PCO  Continue current Rx      Due to the unprecedented pandemic (COVID-19), the patient was counseled on RBA of treatment. Discussed proper hygiene, hand washing, social distancing. CDC guidelines were followed. Pt was told signs and symptoms of COVID-19, and was told to contact me or the Emergency Department if any appear.  Imaging (CT/MRI) Reviewed:   Results Reviewed:       I personally performed the services described in this documentation, as scribed  in my presence, and it is both accurate  and complete.  [name], MD  11/06/2020, 20:13    Matilde Sprang, COA    I am scribing for, and in the presence of Dr. Alfonse Spruce for services provided on 11/06/2020.  [facility]yman Bower, [place], [place]  11/06/2020, 15:02    I saw and examined the patient.  I reviewed the technician/resident's note.  I agree with the findings and plan of care as documented in the resident's note.  Any exceptions/additions are edited/noted.    [name], MD 11/06/2020  14:59      Eye Examination:     Neuro/Psych     Oriented x3: Yes    Mood/Affect: Normal        Visual Acuity     Visual Acuity (Snellen - Linear)       Right Left    Dist sc 20/100 -1 20/25 +2    Dist ph sc 20/30           Edited by: Matilde Sprang, COA            Not recorded       Not recorded       Not recorded       Not recorded       Pupils       Pupils APD    Right PERRL None    Left PERRL None        Extraocular Movement     Extraocular Movement       Right Left     Full, Ortho Full, Ortho          Edited by: Matilde Sprang, COA                                Sensation   V1: Intact bilaterally   V2: Intact bilaterally   V3: Intact bilaterally    Facial Contours:     Hertel:                            OD:                            OS:                            Base:    Eyelids:    Bell's:      Lid edema OD:1 OS: 1 Conj chem OD: 1 OS:1   Lid inject OD: 1 OS: 1 Conj inject OD: None OS: None          3   PF   MRD          3             LF   LC                       USS   LSS                    LAG            Main Ophthalmology Exam     External  Exam       Right Left    External thin brow in proper position, sitting on brow thin brow in proper position, sitting on brow          Slit Lamp Exam       Right Left    Lids/Lashes Ecchymosis Ecchymosis    Conjunctiva/Sclera White and quiet White and quiet    Cornea Clear Clear    Anterior Chamber Deep and quiet Deep and quiet    Iris Round and reactive Round and reactive    Lens 1-2+ CC, not in [place] Posterior chamber intraocular lens          Fundus Exam       Right Left    Vitreous Normal Normal                    IOP straight Tonometry     Tonometry    1wk pov           Edited by: Matilde Sprang, COA                Upgaze     5 down      Past Medical / Surgical / Family / Social History:      has a past medical history of Arthropathy, Cancer (CMS [place]), Heart murmur, HTN (hypertension), Hyperlipidemia, and Wears glasses.    She has no past medical history of Aneurysm (CMS HCC), Angina pectoris (CMS HCC), Asthma, Awareness under anesthesia, BiPAP (biphasic positive airway pressure) dependence, Chronic bronchitis (CMS HCC), Chronic obstructive airway disease (CMS HCC), Clotting disorder (CMS HCC), Congenital anomaly of heart, Congestive heart failure (CMS HCC), Convulsions (CMS HCC), Coronary artery disease, CPAP (continuous positive airway pressure) dependence, CVA (cerebrovascular accident) (CMS HCC), Deep vein thrombosis (DVT) (CMS HCC), Dysrhythmias, Esophageal reflux, H/O hearing loss, H/O urinary tract infection, Hard to  intubate, History of anesthesia complications, History of kidney disease, Malignant hyperthermia, MI (myocardial infarction) (CMS HCC), Neck problem, PONV (postoperative nausea and vomiting), Pseudocholinesterase deficiency, Pulmonary embolism (CMS HCC), Shortness of breath, Sleep apnea, Thyroid disorder, Type 2 diabetes mellitus (CMS [place]), Type I diabetes mellitus (CMS [place]), Upper respiratory infection, Viral hepatitis B, or Viral hepatitis C.    Past Surgical History:   Procedure Laterality Date   . CESAREAN SECTION      X2   . FOOT FRACTURE SURGERY     . HX CATARACT REMOVAL Left 08/05/2020    Dr. Juliane Lack   . HX EYELID SURGERY Bilateral 10/29/2020    S/P 10/29/20 BUL bleph   . KNEE ARTHROSCOPY Left    . SALIVARY GLAND SURGERY             Family Medical History:     Problem Relation (Age of Onset)    Cancer Maternal Grandmother    Cataract Mother, Father    Diabetes Mother            Social History     Tobacco Use   . Smoking status: Former Smoker     Quit date: 1972     Years since quitting: 50.3   . Smokeless tobacco: Never Used   Substance Use Topics   . Alcohol use: Yes     Alcohol/week: 1.0 standard drink     Types: 1 Shots of liquor per week     Comment: Occasional.    . Drug use: Never       I have seen and examined the above patient. I discussed the  above diagnoses listed in the assessment and the above ophthalmic plan of care with the patient and patient's family. All questions were answered. I reviewed and, when necessary, made changes to the technician/resident note, documented ophthalmology exam, chief complaint, history of present illness, allergies, review of systems, past medical, past surgical, family and social history.    [name], MD 11/06/2020  14:59

## 2020-11-11 ENCOUNTER — Other Ambulatory Visit: Payer: Self-pay

## 2020-11-11 MED ORDER — ALPRAZOLAM 0.5 MG PO TABS
ORAL_TABLET | ORAL | 3 refills | Status: AC
  Filled 2020-11-11: qty 60, 30d supply, fill #0
  Filled 2020-12-31: qty 60, 30d supply, fill #1
  Filled 2021-01-25: qty 60, 30d supply, fill #2

## 2020-11-12 ENCOUNTER — Inpatient Hospital Stay (HOSPITAL_BASED_OUTPATIENT_CLINIC_OR_DEPARTMENT_OTHER): Payer: Medicare Other | Admitting: Oncology

## 2020-11-12 ENCOUNTER — Encounter: Payer: Self-pay | Admitting: Oncology

## 2020-11-12 ENCOUNTER — Inpatient Hospital Stay: Payer: Medicare Other

## 2020-11-12 ENCOUNTER — Other Ambulatory Visit: Payer: Self-pay | Admitting: Student

## 2020-11-12 ENCOUNTER — Ambulatory Visit
Admission: RE | Admit: 2020-11-12 | Discharge: 2020-11-12 | Disposition: A | Discharge status: Home / Self Care | Payer: Medicare Other | Source: Ambulatory Visit | Attending: Oncology | Admitting: Oncology

## 2020-11-12 ENCOUNTER — Other Ambulatory Visit: Payer: Self-pay | Admitting: Interventional Radiology

## 2020-11-12 ENCOUNTER — Other Ambulatory Visit: Payer: Self-pay

## 2020-11-12 VITALS — BP 118/81 | HR 109 | Temp 98.6°F | Resp 20 | Wt 116.0 lb

## 2020-11-12 DIAGNOSIS — Z95828 Presence of other vascular implants and grafts: Secondary | ICD-10-CM

## 2020-11-12 DIAGNOSIS — Z5111 Encounter for antineoplastic chemotherapy: Secondary | ICD-10-CM | POA: Diagnosis not present

## 2020-11-12 DIAGNOSIS — C3490 Malignant neoplasm of unspecified part of unspecified bronchus or lung: Secondary | ICD-10-CM

## 2020-11-12 LAB — COMPREHENSIVE METABOLIC PANEL
ALT: 39 U/L (ref 0–44)
AST: 46 U/L — ABNORMAL HIGH (ref 15–41)
Albumin: 3.2 g/dL — ABNORMAL LOW (ref 3.5–5.0)
Alkaline Phosphatase: 149 U/L — ABNORMAL HIGH (ref 38–126)
Anion gap: 12 (ref 5–15)
BUN: 14 mg/dL (ref 8–23)
CO2: 26 mmol/L (ref 22–32)
Calcium: 9.1 mg/dL (ref 8.9–10.3)
Chloride: 97 mmol/L — ABNORMAL LOW (ref 98–111)
Creatinine, Ser: 1.02 mg/dL — ABNORMAL HIGH (ref 0.44–1.00)
GFR, Estimated: 59 mL/min — ABNORMAL LOW (ref >60)
Glucose, Bld: 102 mg/dL — ABNORMAL HIGH (ref 70–99)
Potassium: 3.7 mmol/L (ref 3.5–5.1)
Sodium: 135 mmol/L (ref 135–145)
Total Bilirubin: 0.5 mg/dL (ref 0.3–1.2)
Total Protein: 7.5 g/dL (ref 6.5–8.1)

## 2020-11-12 LAB — MAGNESIUM: Magnesium: 2 mg/dL (ref 1.7–2.4)

## 2020-11-12 LAB — CBC WITH DIFFERENTIAL/PLATELET
Abs Immature Granulocytes: 0.01 10*3/uL (ref 0.00–0.07)
Basophils Absolute: 0 10*3/uL (ref 0.0–0.1)
Basophils Relative: 1 %
Eosinophils Absolute: 0 10*3/uL (ref 0.0–0.5)
Eosinophils Relative: 1 %
HCT: 28.3 % — ABNORMAL LOW (ref 36.0–46.0)
Hemoglobin: 8.8 g/dL — ABNORMAL LOW (ref 12.0–15.0)
Immature Granulocytes: 0 %
Lymphocytes Relative: 26 %
Lymphs Abs: 0.6 10*3/uL — ABNORMAL LOW (ref 0.7–4.0)
MCH: 25.5 pg — ABNORMAL LOW (ref 26.0–34.0)
MCHC: 31.1 g/dL (ref 30.0–36.0)
MCV: 82 fL (ref 80.0–100.0)
Monocytes Absolute: 0.3 10*3/uL (ref 0.1–1.0)
Monocytes Relative: 12 %
Neutro Abs: 1.3 10*3/uL — ABNORMAL LOW (ref 1.7–7.7)
Neutrophils Relative %: 60 %
Platelets: 276 10*3/uL (ref 150–400)
RBC: 3.45 MIL/uL — ABNORMAL LOW (ref 3.87–5.11)
RDW: 18.5 % — ABNORMAL HIGH (ref 11.5–15.5)
WBC: 2.2 10*3/uL — ABNORMAL LOW (ref 4.0–10.5)
nRBC: 0 % (ref 0.0–0.2)

## 2020-11-12 LAB — PHOSPHORUS: Phosphorus: 3.4 mg/dL (ref 2.5–4.6)

## 2020-11-12 MED ORDER — HEPARIN SOD (PORK) LOCK FLUSH 100 UNIT/ML IV SOLN
INTRAVENOUS | Status: AC
  Filled 2020-11-12: qty 5

## 2020-11-12 MED ORDER — HEPARIN SOD (PORK) LOCK FLUSH 100 UNIT/ML IV SOLN
500.0000 [IU] | Freq: Once | INTRAVENOUS | Status: DC
  Filled 2020-11-12: qty 5

## 2020-11-12 MED ORDER — HEPARIN SOD (PORK) LOCK FLUSH 100 UNIT/ML IV SOLN
500.0000 [IU] | Freq: Once | INTRAVENOUS | Status: DC | PRN
  Filled 2020-11-12: qty 5

## 2020-11-12 MED ORDER — SODIUM CHLORIDE 0.9% FLUSH
10.0000 mL | Freq: Once | INTRAVENOUS | Status: AC
  Administered 2020-11-12: 10 mL via INTRAVENOUS
  Filled 2020-11-12: qty 10

## 2020-11-12 MED ORDER — SODIUM CHLORIDE 0.9% FLUSH
10.0000 mL | INTRAVENOUS | Status: DC | PRN
  Filled 2020-11-12: qty 10

## 2020-11-12 MED ORDER — IOHEXOL 180 MG/ML  SOLN
8.0000 mL | Freq: Once | INTRAMUSCULAR | Status: AC
  Administered 2020-11-12: 8 mL

## 2020-11-12 MED ORDER — SODIUM CHLORIDE 0.9 % IV SOLN
Freq: Once | INTRAVENOUS | Status: AC
  Filled 2020-11-12: qty 250

## 2020-11-12 MED ORDER — SODIUM CHLORIDE 0.9 % IV SOLN
1600.0000 mg | Freq: Once | INTRAVENOUS | Status: AC
  Administered 2020-11-12: 1600 mg via INTRAVENOUS
  Filled 2020-11-12: qty 26.3

## 2020-11-12 MED ORDER — PROCHLORPERAZINE MALEATE 10 MG PO TABS
10.0000 mg | ORAL_TABLET | Freq: Once | ORAL | Status: AC
  Administered 2020-11-12: 10 mg via ORAL
  Filled 2020-11-12: qty 1

## 2020-11-12 NOTE — Patient Instructions (Signed)
[facility]COL[facility]  Discharge Instructions: Thank you for choosing [facility] to provide your oncology and hematology care.  If you have a lab appointment with the [facility], please go directly to the [facility] and check in at the registration area.  Wear comfortable clothing and clothing appropriate for easy access to any Portacath or PICC line.   We strive to give you quality time with your provider. You may need to reschedule your appointment if you arrive late (15 or more minutes).  Arriving late affects you and other patients whose appointments are after yours.  Also, if you miss three or more appointments without notifying the office, you may be dismissed from the clinic at the provider's discretion.      For prescription refill requests, have your pharmacy contact our office and allow 72 hours for refills to be completed.    Today you received the following chemotherapy and/or immunotherapy agents - gemcitabine      To help prevent nausea and vomiting after your treatment, we encourage you to take your nausea medication as directed.  BELOW ARE SYMPTOMS THAT SHOULD BE REPORTED IMMEDIATELY: . *FEVER GREATER THAN 100.4 F (38 C) OR HIGHER . *CHILLS OR SWEATING . *NAUSEA AND VOMITING THAT IS NOT CONTROLLED WITH YOUR NAUSEA MEDICATION . *UNUSUAL SHORTNESS OF BREATH . *UNUSUAL BRUISING OR BLEEDING . *URINARY PROBLEMS (pain or burning when urinating, or frequent urination) . *BOWEL PROBLEMS (unusual diarrhea, constipation, pain near the anus) . TENDERNESS IN MOUTH AND THROAT WITH OR WITHOUT PRESENCE OF ULCERS (sore throat, sores in mouth, or a toothache) . UNUSUAL RASH, SWELLING OR PAIN  . UNUSUAL VAGINAL DISCHARGE OR ITCHING   Items with * indicate a potential emergency and should be followed up as soon as possible or go to the Emergency Department if any problems should occur.  Please show the CHEMOTHERAPY ALERT CARD or IMMUNOTHERAPY  ALERT CARD at check-in to the Emergency Department and triage nurse.  Should you have questions after your visit or need to cancel or reschedule your appointment, please contact [facility]  [phone] and follow the prompts.  Office hours are 8:00 a.m. to 4:30 p.m. Monday - Friday. Please note that voicemails left after 4:00 p.m. may not be returned until the following business day.  We are closed weekends and major holidays. You have access to a nurse at all times for urgent questions. Please call the main number to the clinic [phone] and follow the prompts.  For any non-urgent questions, you may also contact your provider using MyChart. We now offer e-Visits for anyone 9 and older to request care online for non-urgent symptoms. For details visit [email or web address].   Also download the MyChart app! Go to the app store, search "MyChart", open the app, select [facility], and log in with your MyChart username and password.  Due to Covid, a mask is required upon entering the hospital/clinic. If you do not have a mask, one will be given to you upon arrival. For doctor visits, patients may have 1 support person aged 58 or older with them. For treatment visits, patients cannot have anyone with them due to current Covid guidelines and our immunocompromised population.   Gemcitabine injection What is this medicine? GEMCITABINE (jem SYE ta been) is a chemotherapy drug. This medicine is used to treat many types of cancer like breast cancer, lung cancer, pancreatic cancer, and ovarian cancer. This medicine may be used for other purposes; ask  your health care provider or pharmacist if you have questions. COMMON BRAND NAME(S): Gemzar, Infugem What should I tell my health care provider before I take this medicine? They need to know if you have any of these conditions:  blood disorders  infection  kidney disease  liver disease  lung or breathing disease,  like asthma  recent or ongoing radiation therapy  an unusual or allergic reaction to gemcitabine, other chemotherapy, other medicines, foods, dyes, or preservatives  pregnant or trying to get pregnant  breast-feeding How should I use this medicine? This drug is given as an infusion into a vein. It is administered in a hospital or clinic by a specially trained health care professional. Talk to your pediatrician regarding the use of this medicine in children. Special care may be needed. Overdosage: If you think you have taken too much of this medicine contact a poison control center or emergency room at once. NOTE: This medicine is only for you. Do not share this medicine with others. What if I miss a dose? It is important not to miss your dose. Call your doctor or health care professional if you are unable to keep an appointment. What may interact with this medicine?  medicines to increase blood counts like filgrastim, pegfilgrastim, sargramostim  some other chemotherapy drugs like cisplatin  vaccines Talk to your doctor or health care professional before taking any of these medicines:  acetaminophen  aspirin  ibuprofen  ketoprofen  naproxen This list may not describe all possible interactions. Give your health care provider a list of all the medicines, herbs, non-prescription drugs, or dietary supplements you use. Also tell them if you smoke, drink alcohol, or use illegal drugs. Some items may interact with your medicine. What should I watch for while using this medicine? Visit your doctor for checks on your progress. This drug may make you feel generally unwell. This is not uncommon, as chemotherapy can affect healthy cells as well as cancer cells. Report any side effects. Continue your course of treatment even though you feel ill unless your doctor tells you to stop. In some cases, you may be given additional medicines to help with side effects. Follow all directions for their  use. Call your doctor or health care professional for advice if you get a fever, chills or sore throat, or other symptoms of a cold or flu. Do not treat yourself. This drug decreases your body's ability to fight infections. Try to avoid being around people who are sick. This medicine may increase your risk to bruise or bleed. Call your doctor or health care professional if you notice any unusual bleeding. Be careful brushing and flossing your teeth or using a toothpick because you may get an infection or bleed more easily. If you have any dental work done, tell your dentist you are receiving this medicine. Avoid taking products that contain aspirin, acetaminophen, ibuprofen, naproxen, or ketoprofen unless instructed by your doctor. These medicines may hide a fever. Do not become pregnant while taking this medicine or for 6 months after stopping it. Women should inform their doctor if they wish to become pregnant or think they might be pregnant. Men should not father a child while taking this medicine and for 3 months after stopping it. There is a potential for serious side effects to an unborn child. Talk to your health care professional or pharmacist for more information. Do not breast-feed an infant while taking this medicine or for at least 1 week after stopping it. Men should  inform their doctors if they wish to father a child. This medicine may lower sperm counts. Talk with your doctor or health care professional if you are concerned about your fertility. What side effects may I notice from receiving this medicine? Side effects that you should report to your doctor or health care professional as soon as possible:  allergic reactions like skin rash, itching or hives, swelling of the face, lips, or tongue  breathing problems  pain, redness, or irritation at site where injected  signs and symptoms of a dangerous change in heartbeat or heart rhythm like chest pain; dizziness; fast or irregular  heartbeat; palpitations; feeling faint or lightheaded, falls; breathing problems  signs of decreased platelets or bleeding - bruising, pinpoint red spots on the skin, black, tarry stools, blood in the urine  signs of decreased red blood cells - unusually weak or tired, feeling faint or lightheaded, falls  signs of infection - fever or chills, cough, sore throat, pain or difficulty passing urine  signs and symptoms of kidney injury like trouble passing urine or change in the amount of urine  signs and symptoms of liver injury like dark yellow or brown urine; general ill feeling or flu-like symptoms; light-colored stools; loss of appetite; nausea; right upper belly pain; unusually weak or tired; yellowing of the eyes or skin  swelling of ankles, feet, hands Side effects that usually do not require medical attention (report to your doctor or health care professional if they continue or are bothersome):  constipation  diarrhea  hair loss  loss of appetite  nausea  rash  vomiting This list may not describe all possible side effects. Call your doctor for medical advice about side effects. You may report side effects to FDA at 1-800-FDA-1088. Where should I keep my medicine? This drug is given in a hospital or clinic and will not be stored at home. NOTE: This sheet is a summary. It may not cover all possible information. If you have questions about this medicine, talk to your doctor, pharmacist, or health care provider.  2021 [employer] (2017-09-13 18:06:11)

## 2020-11-12 NOTE — Progress Notes (Signed)
HR 109 ok to treat per MD

## 2020-11-12 NOTE — Progress Notes (Signed)
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  INTERVAL HISTORY: Patient returns to clinic today for further evaluation and consideration of cycle 2, day 8 of single agent gemcitabine.  She had 1 day of profound weakness and fatigue, but otherwise tolerated her last infusion well.  She has no neurologic complaints.  She denies any fevers.  She has a good appetite and denies weight loss.  She denies any chest pain, shortness of breath, cough, or hemoptysis.  She denies any nausea, vomiting, constipation, or diarrhea.  She has no urinary complaints.  Patient offers no further specific complaints today.  REVIEW OF SYSTEMS:   Review of Systems  Constitutional: Positive for malaise/fatigue. Negative for fever and weight loss.  Respiratory: Negative.  Negative for cough, hemoptysis and shortness of breath.   Cardiovascular: Negative.  Negative for chest pain and leg swelling.  Gastrointestinal: Negative.  Negative for abdominal pain, constipation, diarrhea and nausea.  Genitourinary: Negative.  Negative for dysuria and flank pain.  Musculoskeletal: Negative for back pain and joint pain.  Skin: Negative.  Negative for rash.  Neurological: Positive for weakness. Negative for dizziness, sensory change, focal weakness and headaches.  Psychiatric/Behavioral: Negative.  The patient is not nervous/anxious.     As per HPI. Otherwise, a complete review of systems is negative.  PAST MEDICAL HISTORY: Past Medical History:  Diagnosis Date  . Abnormal echocardiogram   . Acid reflux   . Allergic rhinitis   . Anemia    HAD TO HAVE IRON INFUSIONS BACK IN 2015  . Arthritis    bil hands and back  .  Asthma   . Bloody discharge from left nipple   . Cancer of right lung ([place]) 2015   lung cancer - chemo, Dr Genevive Bi removed RML Lobectomy.   . Colon polyps   . COPD (chronic obstructive pulmonary disease) ([place])   . Dyspnea    WITH EXERTION  . Family history of breast cancer   . Family history of cervical cancer   . Family history of colon cancer   . Family history of skin cancer   . Hypertension   . Malignant neoplasm of middle lobe of right lung ([place])   . Mitral valve prolapse   . Mitral valve prolapse   . Non-small cell lung cancer ([place])   . Personal history of chemotherapy    F/U Lung Cancer  . Personal history of radiation therapy    Lung cancer  . Primary cancer of right middle lobe of lung ([place]) 03/11/2016  . Squamous cell carcinoma of skin 07/07/2009   right supraclavicular/EDC  . Squamous cell carcinoma of skin 11/04/2019   Left pretibial. [facility].  . Stage IV adenocarcinoma of lung, unspecified laterality ([place])     PAST SURGICAL HISTORY: Past Surgical History:  Procedure Laterality Date  . ABDOMINAL HYSTERECTOMY    . BAND HEMORRHOIDECTOMY    . BREAST BIOPSY Left 03/01/2019   Procedure: LEFT BREAST EXCISIONAL BIOPSY WITH NEEDLE LOCALIZATION;  Surgeon: Herbert Pun, MD;  Location: [facility] ORS;  Service: General;  Laterality: Left;  . BREAST EXCISIONAL BIOPSY Left 1994 (-), 2004 (-)   benign  . BREAST EXCISIONAL BIOPSY Left 03/01/2019   - INTRADUCTAL PAPILLOMA WITH SCLEROSIS.   Marland Kitchen  COLONOSCOPY  2008    Dr. Donnella Sham  . COLONOSCOPY WITH PROPOFOL N/A 04/12/2019   Procedure: COLONOSCOPY WITH PROPOFOL;  Surgeon: Lollie Sails, MD;  Location: [facility] ENDOSCOPY;  Service: Endoscopy;  Laterality: N/A;  . IR RADIOLOGIST EVAL & MGMT  08/28/2019  . IR RADIOLOGIST EVAL & MGMT  10/30/2019  . IR RADIOLOGIST EVAL & MGMT  11/26/2019  . KNEE SURGERY  2007  . LUNG LOBECTOMY Right    middle lobe  . PORTACATH PLACEMENT Right 02/09/2017   Procedure: INSERTION PORT-A-CATH;  Surgeon: Nestor Lewandowsky, MD;  Location: [facility] ORS;  Service: General;  Laterality: Right;  . RADIOLOGY WITH ANESTHESIA N/A 10/02/2019   Procedure: MICROWAVE THERMA ABLATION;  Surgeon: Aletta Edouard, MD;  Location: [facility] ORS;  Service: Radiology;  Laterality: N/A;  . SALIVARY GLAND SURGERY Left 1983   with lymph node removal also  . SALIVARY GLAND SURGERY     salivary gland removal   . UPPER GI ENDOSCOPY  2008  . VIDEO BRONCHOSCOPY WITH ENDOBRONCHIAL ULTRASOUND Right 01/24/2017   Procedure: VIDEO BRONCHOSCOPY WITH ENDOBRONCHIAL ULTRASOUND;  Surgeon: [name], MD;  Location: [facility] ORS;  Service: Pulmonary;  Laterality: Right;    FAMILY HISTORY: Family History  Problem Relation Age of Onset  . Breast cancer Maternal Aunt 60  . Alzheimer's disease Maternal Aunt   . Breast cancer Paternal Aunt        dx. in her late 50s  . Breast cancer Maternal Grandmother        dx. in her 27s  . Cervical cancer Maternal Grandmother 60  . Colon cancer Maternal Grandmother        dx. in her 27s  . Breast cancer Maternal Aunt 60  . Alzheimer's disease Maternal Aunt   . Breast cancer Paternal Aunt        dx. in her late 56s  . Heart disease Paternal Aunt   . Healthy Mother   . Healthy Father   . Colon cancer Father 33  . Skin cancer Father   . Heart attack Father   . Heart attack Paternal Grandfather   . Skin cancer Paternal Aunt   . Heart attack Paternal Uncle 50  . Heart attack Paternal Uncle     ADVANCED DIRECTIVES (Y/N):  N  HEALTH MAINTENANCE: Social History   Tobacco Use  . Smoking status: Never Smoker  . Smokeless tobacco: Never Used  Vaping Use  . Vaping Use: Never used  Substance Use Topics  . Alcohol use: Yes    Alcohol/week: 7.0 standard drinks    Types: 7 Glasses of wine per week  . Drug use: No     Colonoscopy:  PAP:  Bone density:  Lipid panel:  Allergies  Allergen Reactions  . Other Other (See Comments)    Cat gut sutures get infected Cat gut sutures get infected  .  Penicillins Swelling and Rash    Did it involve swelling of the face/tongue/throat, SOB, or low BP? Unknown Did it involve sudden or severe rash/hives, skin peeling, or any reaction on the inside of your mouth or nose? No Did you need to seek medical attention at a hospital or doctor's office? Yes When did it last happen?30 + years  If all above answers are "NO", may proceed with cephalosporin use.    . Sulfa Antibiotics Nausea And Vomiting    Current Outpatient Medications  Medication Sig Dispense Refill  . albuterol (VENTOLIN HFA) 108 (90 Base) MCG/ACT inhaler Inhale 1-2 puffs into the lungs  every 6 (six) hours as needed for wheezing or shortness of breath. 1 g 10  . ALPRAZolam (XANAX) 0.25 MG tablet Take 1 tablet (0.25 mg total) by mouth at bedtime as needed for anxiety. 30 tablet 0  . ALPRAZolam (XANAX) 0.5 MG tablet take 1-2 tablets by mouth at night as needed for sleep 60 tablet 3  . ascorbic acid (VITAMIN C) 100 MG tablet Take 100 mg by mouth daily.     . B Complex Vitamins (VITAMIN B COMPLEX PO) Take 1 tablet by mouth daily.     . Biotin w/ Vitamins C & E (HAIR/SKIN/NAILS PO) Take 1 tablet by mouth daily.    . Calcium Carb-Cholecalciferol 600-800 MG-UNIT TABS Take 2 tablets by mouth daily.    . Cholecalciferol 125 MCG (5000 UT) capsule Take 5,000 Units by mouth daily.    . clobetasol ointment (TEMOVATE) 4.96 % Apply 1 application topically 2 (two) times daily. Dry area and apply to mouth sores twice daily as needed for up to 2 weeks. 15 g 0  . COLLAGEN PO Take 1,000 mg by mouth daily.    . cyclobenzaprine (FLEXERIL) 5 MG tablet Take 1 tablet (5 mg total) by mouth 3 (three) times daily as needed for muscle spasms. 30 tablet 0  . fexofenadine (ALLEGRA) 180 MG tablet Take 180 mg by mouth daily as needed for allergies.     . fluticasone (FLONASE) 50 MCG/ACT nasal spray Place 2 sprays into both nostrils daily as needed for allergies or rhinitis.    . Fluticasone-Salmeterol (ADVAIR)  250-50 MCG/DOSE AEPB INHALE 1 PUFF BY MOUTH 2 TIMES DAILY AS NEEDED FOR BREATHING ISSUES 60 each 10  . ibuprofen (ADVIL) 800 MG tablet TAKE 1 TABLET BY MOUTH EVERY 8 HOURS AS NEEDED FOR PAIN **ALWAYS TAKE WITH FOOD** 90 tablet 2  . lansoprazole (PREVACID) 30 MG capsule TAKE 1 CAPSULE BY MOUTH TWICE DAILY 180 capsule 3  . lidocaine-prilocaine (EMLA) cream Apply 1 application topically as needed. Apply generously over the Mediport 45 minutes prior to chemotherapy. 30 g 0  . LORazepam (ATIVAN) 0.5 MG tablet DISSOLVE 1 TABLET UNDER TONGUE 30 MINUTES PRIOR TO CT OR MRI SCANS AS NEEDED. 5 tablet 0  . ondansetron (ZOFRAN) 8 MG tablet Take 1 tablet (8 mg total) by mouth every 8 (eight) hours as needed for nausea or vomiting (start 3 days; after chemo). 40 tablet 1  . polyethylene glycol (MIRALAX / GLYCOLAX) packet Take 17 g by mouth daily as needed for mild constipation.    . prochlorperazine (COMPAZINE) 10 MG tablet Take 1 tablet (10 mg total) by mouth every 6 (six) hours as needed for nausea or vomiting. 60 tablet 2  . triamcinolone (KENALOG) 0.1 % paste Use as directed 1 application in the mouth or throat 2 (two) times daily. 15 g 12  . Vitamin D, Ergocalciferol, (DRISDOL) 1.25 MG (50000 UNIT) CAPS capsule TAKE 1 CAPSULE (50,000 UNITS TOTAL) BY MOUTH ONCE A WEEK 12 capsule 3  . zinc gluconate 50 MG tablet Take 50 mg by mouth daily.    . magic mouthwash SOLN Take 5-10 mLs by mouth 4 (four) times daily as needed for mouth pain. (Patient not taking: No sig reported) 480 mL 0   No current facility-administered medications for this visit.   Facility-Administered Medications Ordered in Other Visits  Medication Dose Route Frequency Provider Last Rate Last Admin  . heparin lock flush 100 unit/mL  500 Units Intravenous Once Lloyd Huger, MD      .  sodium chloride flush (NS) 0.9 % injection 10 mL  10 mL Intravenous PRN Lloyd Huger, MD      . sodium chloride flush (NS) 0.9 % injection 10 mL  10 mL  Intravenous PRN Lloyd Huger, MD   10 mL at 01/22/18 1419    OBJECTIVE: Vitals:   11/12/20 1017  BP: 118/81  Pulse: (!) 109  Resp: 20  Temp: 98.6 F (37 C)     Body mass index is 20.55 kg/m.    ECOG FS:1 - Symptomatic but completely ambulatory  General: Well-developed, well-nourished, no acute distress. Eyes: Pink conjunctiva, anicteric sclera. HEENT: Normocephalic, moist mucous membranes. Lungs: No audible wheezing or coughing. Heart: Regular rate and rhythm. Abdomen: Soft, nontender, no obvious distention. Musculoskeletal: No edema, cyanosis, or clubbing. Neuro: Alert, answering all questions appropriately. Cranial nerves grossly intact. Skin: No rashes or petechiae noted. Psych: Normal affect.   LAB RESULTS:  Lab Results  Component Value Date   NA 135 11/12/2020   K 3.7 11/12/2020   CL 97 (L) 11/12/2020   CO2 26 11/12/2020   GLUCOSE 102 (H) 11/12/2020   BUN 14 11/12/2020   CREATININE 1.02 (H) 11/12/2020   CALCIUM 9.1 11/12/2020   PROT 7.5 11/12/2020   ALBUMIN 3.2 (L) 11/12/2020   AST 46 (H) 11/12/2020   ALT 39 11/12/2020   ALKPHOS 149 (H) 11/12/2020   BILITOT 0.5 11/12/2020   GFRNONAA 59 (L) 11/12/2020   GFRAA >60 03/23/2020    Lab Results  Component Value Date   WBC 2.2 (L) 11/12/2020   NEUTROABS 1.3 (L) 11/12/2020   HGB 8.8 (L) 11/12/2020   HCT 28.3 (L) 11/12/2020   MCV 82.0 11/12/2020   PLT 276 11/12/2020     STUDIES: DG Fluoro Guide CV Line Right  Result Date: 11/12/2020 INDICATION: 70 year old female with history of non-small cell lung cancer and right internal jugular vein indwelling Port-A-Cath placed by Dr. Magdalen Spatz on 02/09/2017. The port has functioned perfectly until recently with inability to aspirate. EXAM: FLUOROSCOPIC GUIDED PORT A CATHETER CHECK MEDICATIONS: None. CONTRAST:  None FLUOROSCOPY TIME:  0.3 minutes (23.5 mGy) COMPLICATIONS: None immediate. TECHNIQUE: The procedure, risks, benefits, and alternatives were explained to the  patient and informed written consent was obtained. A timeout was performed prior to the initiation of the procedure. The patient's chest port a catheter was accessed by the infusion center prior to arrival. The patient was placed supine on the fluoroscopy table. A preprocedural spot fluoroscopic image was obtained of the chest in existing port a catheter. Note was made of difficulty aspirating blood from the port a catheter. Contrast was injected via the Port a catheter and images were reviewed. The Port a catheter was flushed with a heparin dwell and de accessed. A dressing was placed. The patient tolerated the procedure well without immediate postprocedural complication. FINDINGS: Unchanged positioning of anterior chest wall right internal jugular vein approach port a catheter with tip projected over the expected location of the central SVC. There was difficulty aspirating blood from the port a catheter. Contrast injection demonstrated the presence of a fibrin sheath about the distal end of the Port a catheter which was located within the central aspect of the SVC. There is no evidence of catheter kink or fracture. No contrast extravasation. IMPRESSION: Difficulty aspirating of the Port a catheter is secondary to the presence of a fibrin sheath. 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 Above findings were discussed and the following options were provided to the patient:  1. Maintain the Port a catheter as is, realizing it may be difficulty for blood draws, but could be used for medication administration. 2. Attempt to perform a fibrin sheath stripping from the port a catheter tip via a right common femoral vein approach. 3. Replace the subclavian vein approach port a catheter with an IJ approach catheter with tip terminating within the superior aspect of the right atrium. After discussion of all potential treatment option, the patient has elected to pursue port stripping. This was discussed with Dr.  Grayland Ormond who is in agreement with this plan of care. As such, the procedure will be scheduled for 11/14/19. 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  She initially received 4 cycles of adjuvant cisplatin and Alimta completing on January 23, 2014.  Patient was on the [facility] protocol for maintenance Tarceva versus placebo, but discontinued treatment in November 2015 because of significant side effects.  Although blinded, presumption was she was receiving Tarceva.  She recently was noted to have a recurrence confirmed by right paratracheal lymph node biopsy on January 24, 2017.  PET scan on February 03, 2017 did not reveal any distant metastasis.  She underwent treatment with weekly carboplatinum and Taxol along with daily XRT finishing in October 2018.  She initiated maintenance durvalumab on June 29, 2017.  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  Patient received Tagrisso from April 2019 through September 2021. PET scan results from July 09, 2019 revealed mild progression of disease in her lungs with an isolated left kidney metastasis that has undergone ablation. PET scan on March 11, 2020 revealed continued progression of disease in her lungs.    She subsequently discontinued Tagrisso and initiated single agent pemetrexed on March 23, 2020.    Patient enrolled in clinical trial at [facility] between December 2021 and April 2022.  She initiated single agent gemcitabine on October 15, 2020.   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  PLAN:   1. 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  Patient's most recent imaging with CT scan at [facility] on October 05, 2020 revealed new and increasing size of bilateral pulmonary nodules, bilateral axillary and left supraclavicular lymphadenopathy, and interval enlargement of multiple lesions throughout left and right lobes of liver.  MRI of the brain also on October 05, 2020 revealed stable metastatic lesions.  Because of progression of disease, patient discontinued clinical trial and now will initiate single agent gemcitabine on days 1 and 8 with day 15 off.  Will reimage in July 2022.  Proceed with cycle 2, day 8 of treatment.  Return to clinic in 2 weeks for further evaluation and consideration of cycle 3, day 1. 2.  Diarrhea: Resolved.  Continue Lomotil as needed. 3.  Cough: Chronic and unchanged.  Patient does not complain of this today.  Monitor.   4.  Breast lesion: Benign papilloma.  Patient underwent lumpectomy on March 01, 2019.  Her most recent mammogram on March 03, 2020 was reported as BI-RADS 2.   5.  Anemia: Patient's hemoglobin is slowly trending down and is now 8.8.  Monitor. 6.  Renal insufficiency: Mild, monitor. 7.  Hyponatremia: Resolved.  Patient sodium  is 135 today. 8.  Leukopenia: Mild.  Proceed with treatment as above. 9.  Thrombocytosis: Resolved. 10.  Nausea: Continue Compazine as needed.  Patient expressed understanding and was in agreement with this plan. She also understands that She can call clinic at any time with any questions, concerns, or complaints.    Lloyd Huger, MD   11/12/2020 2:58 PM

## 2020-11-12 NOTE — H&P (Signed)
Chief Complaint: Patient was seen in consultation today for port-a-catheter revision  Referring Physician(s): Suttle, [place] Physician: Ruthann Cancer  Patient Status: [place] - Out-pt  History of Present Illness: Lauren Page is a 70 y.o. 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 The port has recently developed an inability to aspirate and she presented to the [facility] Interventional Radiology department 11/12/20 for a port injection. Contrast injection demonstrated the presence of a fibrin sheath towards the distal end of the port-a-catheter and there was difficulty with aspiration.   Treatment options were discussed with the patient including leaving the port as is and using only for medication administration, fibrin sheath stripping via a right common femoral approach or to replace the port. The patient elected to pursue port stripping and she presents today to IR for this procedure. Dr. Grayland Ormond is aware of and in agreement with this plan.   Past Medical History:  Diagnosis Date  . Abnormal echocardiogram   . Acid reflux   . Allergic rhinitis   . Anemia    HAD TO HAVE IRON INFUSIONS BACK IN 2015  . Arthritis    bil hands and back  . Asthma   . Bloody discharge from left nipple   . Cancer of right lung ([place]) 2015   lung cancer - chemo, Dr Genevive Bi removed RML Lobectomy.   . Colon polyps   . COPD (chronic obstructive pulmonary disease) ([place])   . Dyspnea    WITH EXERTION  . Family history of breast cancer   . Family history of cervical cancer   . Family history of colon cancer   . Family history of skin cancer   . Hypertension   . Malignant neoplasm of middle lobe of right lung ([place])   . Mitral valve prolapse   . Mitral valve prolapse   . Non-small cell lung cancer ([place])   . Personal history of chemotherapy    F/U Lung Cancer  . Personal  history of radiation therapy    Lung cancer  . Primary cancer of right middle lobe of lung ([place]) 03/11/2016  . Squamous cell carcinoma of skin 07/07/2009   right supraclavicular/EDC  . Squamous cell carcinoma of skin 11/04/2019   Left pretibial. [facility] SCC.  . Stage IV adenocarcinoma of lung, unspecified laterality [facility])     Past Surgical History:  Procedure Laterality Date  . ABDOMINAL HYSTERECTOMY    . BAND HEMORRHOIDECTOMY    . BREAST BIOPSY Left 03/01/2019   Procedure: LEFT BREAST EXCISIONAL BIOPSY WITH NEEDLE LOCALIZATION;  Surgeon: Herbert Pun, MD;  Location: [facility] ORS;  Service: General;  Laterality: Left;  . BREAST EXCISIONAL BIOPSY Left 1994 (-), 2004 (-)   benign  . BREAST EXCISIONAL BIOPSY Left 03/01/2019   - INTRADUCTAL PAPILLOMA WITH SCLEROSIS.   Marland Kitchen COLONOSCOPY  2008    Dr. Donnella Sham  . COLONOSCOPY WITH PROPOFOL N/A 04/12/2019   Procedure: COLONOSCOPY WITH PROPOFOL;  Surgeon: Lollie Sails, MD;  Location: [facility] ENDOSCOPY;  Service: Endoscopy;  Laterality: N/A;  . IR RADIOLOGIST EVAL & MGMT  08/28/2019  . IR RADIOLOGIST EVAL & MGMT  10/30/2019  . IR RADIOLOGIST EVAL & MGMT  11/26/2019  . KNEE SURGERY  2007  . LUNG LOBECTOMY Right    middle lobe  . PORTACATH PLACEMENT Right 02/09/2017   Procedure: INSERTION PORT-A-CATH;  Surgeon: Nestor Lewandowsky, MD;  Location: [facility] ORS;  Service: General;  Laterality: Right;  . RADIOLOGY WITH ANESTHESIA N/A 10/02/2019   Procedure: MICROWAVE THERMA ABLATION;  Surgeon: Aletta Edouard, MD;  Location: [facility] ORS;  Service: Radiology;  Laterality: N/A;  . SALIVARY GLAND SURGERY Left 1983   with lymph node removal also  . SALIVARY GLAND SURGERY     salivary gland removal   . UPPER GI ENDOSCOPY  2008  . VIDEO BRONCHOSCOPY WITH ENDOBRONCHIAL ULTRASOUND Right 01/24/2017   Procedure: VIDEO BRONCHOSCOPY WITH ENDOBRONCHIAL ULTRASOUND;  Surgeon: [name], MD;  Location: [facility] ORS;  Service: Pulmonary;  Laterality: Right;     Allergies: Other, Penicillins, and Sulfa antibiotics  Medications: Prior to Admission medications   Medication Sig Start Date End Date Taking? Authorizing Provider  albuterol (VENTOLIN HFA) 108 (90 Base) MCG/ACT inhaler Inhale 1-2 puffs into the lungs every 6 (six) hours as needed for wheezing or shortness of breath. 05/22/20   Flora Lipps, MD  ALPRAZolam Duanne Moron) 0.25 MG tablet Take 1 tablet (0.25 mg total) by mouth at bedtime as needed for anxiety. 10/20/20   Jacquelin Hawking, NP  ALPRAZolam Duanne Moron) 0.5 MG tablet take 1-2 tablets by mouth at night as needed for sleep 11/11/20     ascorbic acid (VITAMIN C) 100 MG tablet Take 100 mg by mouth daily.     [provider]  B Complex Vitamins (VITAMIN B COMPLEX PO) Take 1 tablet by mouth daily.     [provider]  Biotin w/ Vitamins C & E (HAIR/SKIN/NAILS PO) Take 1 tablet by mouth daily.    [provider]  Calcium Carb-Cholecalciferol 600-800 MG-UNIT TABS Take 2 tablets by mouth daily.    [provider]  Cholecalciferol 125 MCG (5000 UT) capsule Take 5,000 Units by mouth daily.    [provider]  clobetasol ointment (TEMOVATE) 2.09 % Apply 1 application topically 2 (two) times daily. Dry area and apply to mouth sores twice daily as needed for up to 2 weeks. 03/02/20   Lloyd Huger, MD  COLLAGEN PO Take 1,000 mg by mouth daily.    [provider]  cyclobenzaprine (FLEXERIL) 5 MG tablet Take 1 tablet (5 mg total) by mouth 3 (three) times daily as needed for muscle spasms. 08/20/19   Lloyd Huger, MD  fexofenadine (ALLEGRA) 180 MG tablet Take 180 mg by mouth daily as needed for allergies.     [provider]  fluticasone (FLONASE) 50 MCG/ACT nasal spray Place 2 sprays into both nostrils daily as needed for allergies or rhinitis.    [provider]  Fluticasone-Salmeterol (ADVAIR) 250-50 MCG/DOSE AEPB INHALE 1 PUFF BY MOUTH 2 TIMES DAILY AS NEEDED FOR BREATHING  ISSUES 05/22/20 05/22/21  Flora Lipps, MD  ibuprofen (ADVIL) 800 MG tablet TAKE 1 TABLET BY MOUTH EVERY 8 HOURS AS NEEDED FOR PAIN **ALWAYS TAKE WITH FOOD** 07/29/20 07/29/21  Voncille Lo, PA-C  lansoprazole (PREVACID) 30 MG capsule TAKE 1 CAPSULE BY MOUTH TWICE DAILY 05/25/20 05/25/21  [name], MD  lidocaine-prilocaine (EMLA) cream Apply 1 application topically as needed. Apply generously over the Mediport 45 minutes prior to chemotherapy. 02/13/17   Cammie Sickle, MD  LORazepam (ATIVAN) 0.5 MG tablet DISSOLVE 1 TABLET UNDER TONGUE 30 MINUTES PRIOR TO CT OR MRI SCANS AS NEEDED. 05/20/20 11/16/20  Voncille Lo, PA-C  magic mouthwash SOLN Take 5-10 mLs by mouth 4 (four) times daily as needed for mouth pain. Patient not taking: No sig reported 11/25/19   Lloyd Huger, MD  ondansetron (ZOFRAN) 8 MG tablet  Take 1 tablet (8 mg total) by mouth every 8 (eight) hours as needed for nausea or vomiting (start 3 days; after chemo). 02/13/17   Cammie Sickle, MD  polyethylene glycol (MIRALAX / GLYCOLAX) packet Take 17 g by mouth daily as needed for mild constipation.    [provider]  prochlorperazine (COMPAZINE) 10 MG tablet Take 1 tablet (10 mg total) by mouth every 6 (six) hours as needed for nausea or vomiting. 11/05/20   Lloyd Huger, MD  triamcinolone (KENALOG) 0.1 % paste Use as directed 1 application in the mouth or throat 2 (two) times daily. 11/25/19   Lloyd Huger, MD  Vitamin D, Ergocalciferol, (DRISDOL) 1.25 MG (50000 UNIT) CAPS capsule TAKE 1 CAPSULE (50,000 UNITS TOTAL) BY MOUTH ONCE A WEEK 04/29/20 04/29/21  [name], MD  zinc gluconate 50 MG tablet Take 50 mg by mouth daily.    [provider]     Family History  Problem Relation Age of Onset  . Breast cancer Maternal Aunt 60  . Alzheimer's disease Maternal Aunt   . Breast cancer Paternal Aunt        dx. in her late 46s  . Breast cancer Maternal Grandmother         dx. in her 62s  . Cervical cancer Maternal Grandmother 60  . Colon cancer Maternal Grandmother        dx. in her 59s  . Breast cancer Maternal Aunt 60  . Alzheimer's disease Maternal Aunt   . Breast cancer Paternal Aunt        dx. in her late 81s  . Heart disease Paternal Aunt   . Healthy Mother   . Healthy Father   . Colon cancer Father 51  . Skin cancer Father   . Heart attack Father   . Heart attack Paternal Grandfather   . Skin cancer Paternal Aunt   . Heart attack Paternal Uncle 69  . Heart attack Paternal Uncle     Social History   Socioeconomic History  . Marital status: Married    Spouse name: Not on file  . Number of children: Not on file  . Years of education: Not on file  . Highest education level: Not on file  Occupational History  . Not on file  Tobacco Use  . Smoking status: Never Smoker  . Smokeless tobacco: Never Used  Vaping Use  . Vaping Use: Never used  Substance and Sexual Activity  . Alcohol use: Yes    Alcohol/week: 7.0 standard drinks    Types: 7 Glasses of wine per week  . Drug use: No  . Sexual activity: Not on file  Other Topics Concern  . Not on file  Social History Narrative  . Not on file   Social Determinants of Health   Financial Resource Strain: Not on file  Food Insecurity: Not on file  Transportation Needs: Not on file  Physical Activity: Not on file  Stress: Not on file  Social Connections: Not on file    Review of Systems: A 12 point ROS discussed and pertinent positives are indicated in the HPI above.  All other systems are negative.  Review of Systems  Constitutional: Positive for appetite change and fatigue.  Respiratory: Negative for cough and shortness of breath.   Cardiovascular: Negative for chest pain and leg swelling.  Gastrointestinal: Positive for nausea. Negative for abdominal pain, diarrhea and vomiting.  Neurological: Negative for dizziness and headaches.    Vital Signs: BP Marland Kitchen)  110/99   Pulse (!) 103    Temp 98.2 F (36.8 C) (Oral)   Resp 19   Ht 5\' 3"  (1.6 m)   Wt 115 lb (52.2 kg)   SpO2 98%   BMI 20.37 kg/m   Physical Exam Constitutional:      General: She is not in acute distress. HENT:     Mouth/Throat:     Mouth: Mucous membranes are moist.     Pharynx: Oropharynx is clear.  Cardiovascular:     Rate and Rhythm: Regular rhythm. Tachycardia present.     Pulses: Normal pulses.     Heart sounds: Normal heart sounds.     Comments: Right upper chest port-a-catheter, not accessed.  Pulmonary:     Effort: Pulmonary effort is normal.     Breath sounds: Normal breath sounds.  Abdominal:     General: Bowel sounds are normal.     Palpations: Abdomen is soft.  Musculoskeletal:     Right lower leg: No edema.     Left lower leg: No edema.  Skin:    General: Skin is warm and dry.  Neurological:     Mental Status: She is alert and oriented to person, place, and time.     Imaging: DG Fluoro Guide CV Line Right  Result Date: 11/12/2020 INDICATION: 70 year old female with history of non-small cell lung cancer and right internal jugular vein indwelling Port-A-Cath placed by Dr. Magdalen Spatz on 02/09/2017. The port has functioned perfectly until recently with inability to aspirate. EXAM: FLUOROSCOPIC GUIDED PORT A CATHETER CHECK MEDICATIONS: None. CONTRAST:  None FLUOROSCOPY TIME:  0.3 minutes (81.1 mGy) COMPLICATIONS: None immediate. TECHNIQUE: The procedure, risks, benefits, and alternatives were explained to the patient and informed written consent was obtained. A timeout was performed prior to the initiation of the procedure. The patient's chest port a catheter was accessed by the infusion center prior to arrival. The patient was placed supine on the fluoroscopy table. A preprocedural spot fluoroscopic image was obtained of the chest in existing port a catheter. Note was made of difficulty aspirating blood from the port a catheter. Contrast was injected via the Port a catheter and images were  reviewed. The Port a catheter was flushed with a heparin dwell and de accessed. A dressing was placed. The patient tolerated the procedure well without immediate postprocedural complication. FINDINGS: Unchanged positioning of anterior chest wall right internal jugular vein approach port a catheter with tip projected over the expected location of the central SVC. There was difficulty aspirating blood from the port a catheter. Contrast injection demonstrated the presence of a fibrin sheath about the distal end of the Port a catheter which was located within the central aspect of the SVC. There is no evidence of catheter kink or fracture. No contrast extravasation. IMPRESSION: Difficulty aspirating of the Port a catheter is secondary to the presence of a fibrin sheath. 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 Above findings were discussed and the following options were provided to the patient: 1. Maintain the Port a catheter as is, realizing it may be difficulty for blood draws, but could be used for medication administration. 2. Attempt to perform a fibrin sheath stripping from the port a catheter tip via a right common femoral vein approach. 3. Replace the subclavian vein approach port a catheter with an IJ approach catheter with tip terminating within the superior aspect of the right atrium. After discussion of all potential treatment option, the patient has elected to pursue port stripping. This  was discussed with Dr. Grayland Ormond who is in agreement with this plan of care. As such, the procedure will be scheduled for 11/14/19. Ruthann Cancer, MD [facility] Radiology Electronically Signed   By: Ruthann Cancer MD   On: 11/12/2020 14:48    Labs:  CBC: Recent Labs    10/15/20 0934 10/22/20 0937 11/05/20 1252 11/12/20 0937  WBC 5.0 2.9* 4.7 2.2*  HGB 10.5* 9.4* 9.1* 8.8*  HCT 32.3* 28.7* 28.7* 28.3*  PLT 233 138* 518* 276    COAGS: No results for  input(s): INR, APTT in the last 8760 hours.  BMP: Recent Labs    11/14/19 1259 02/17/20 1414 03/23/20 1050 04/14/20 1058 10/15/20 0934 10/22/20 0937 11/05/20 1252 11/12/20 0937  NA 131* 134* 136   < > 133* 133* 134* 135  K 3.9 4.1 4.0   < > 4.1 4.4 4.6 3.7  CL 99 101 103   < > 100 98 100 97*  CO2 23 24 24    < > 24 23 25 26   GLUCOSE 120* 88 101*   < > 105* 101* 104* 102*  BUN 12 16 15    < > 18 15 17 14   CALCIUM 8.6* 8.4* 8.6*   < > 9.0 9.0 9.0 9.1  CREATININE 0.91 0.92 0.75   < > 1.12* 1.00 0.96 1.02*  GFRNONAA >60 >60 >60   < > 53* >60 >60 59*  GFRAA >60 >60 >60  --   --   --   --   --    < > = values in this interval not displayed.    LIVER FUNCTION TESTS: Recent Labs    10/15/20 0934 10/22/20 0937 11/05/20 1252 11/12/20 0937  BILITOT 0.6 0.5 0.4 0.5  AST 37 42* 25 46*  ALT 19 31 18  39  ALKPHOS 134* 148* 131* 149*  PROT 7.3 7.2 7.1 7.5  ALBUMIN 3.6 2.9* 3.2* 3.2*    TUMOR MARKERS: No results for input(s): AFPTM, CEA, CA199, CHROMGRNA in the last 8760 hours.  Assessment and Plan:  Stage IV adenocarcinoma of the lung with malfunctioning right IJ port-a-catheter: Lauren Page, 70 year old female, presents today to the [facility] for an image-guided port-a-catheter revision. This procedure will be done with moderate sedation.  Risks and benefits of image-guided port-a-catheter revision were discussed with the patient including, but not limited to bleeding, infection, pneumothorax and/or need for additional procedures.  All of the patient's questions were answered, patient is agreeable to proceed. She has been NPO. Vital signs and recent lab values have been reviewed.   Consent signed and in chart.  Thank you for this interesting consult.  I greatly enjoyed meeting Lauren Page and look forward to participating in their care.  A copy of this report was sent to the requesting provider on this date.  Electronically Signed: Soyla Dryer,  AGACNP-BC [phone] 11/13/2020, 9:52 AM   I spent a total of  30 Minutes   in face to face in clinical consultation, greater than 50% of which was counseling/coordinating care for image-guided port-a-catheter revision.

## 2020-11-12 NOTE — Progress Notes (Signed)
Port flushes easily, but without blood return again today. OK to use port for gemcitabine per Dr. Grayland Ormond. Port dye study today in radiology. Patient remains accessed at discharge - port flushed with NS only on discharge from infusion and will need to receive heparin flush with deaccess in radiology.

## 2020-11-12 NOTE — Progress Notes (Signed)
Patient here today for follow up and treatment consideration regarding lung cancer.

## 2020-11-13 ENCOUNTER — Ambulatory Visit
Admission: RE | Admit: 2020-11-13 | Discharge: 2020-11-13 | Disposition: A | Discharge status: Home / Self Care | Payer: Medicare Other | Source: Ambulatory Visit | Attending: Interventional Radiology | Admitting: Interventional Radiology

## 2020-11-13 DIAGNOSIS — Z882 Allergy status to sulfonamides status: Secondary | ICD-10-CM | POA: Insufficient documentation

## 2020-11-13 DIAGNOSIS — Z79899 Other long term (current) drug therapy: Secondary | ICD-10-CM | POA: Insufficient documentation

## 2020-11-13 DIAGNOSIS — Z880 Allergy status to penicillin: Secondary | ICD-10-CM | POA: Diagnosis not present

## 2020-11-13 DIAGNOSIS — C3490 Malignant neoplasm of unspecified part of unspecified bronchus or lung: Secondary | ICD-10-CM | POA: Insufficient documentation

## 2020-11-13 HISTORY — PX: IR PORT REPAIR CENTRAL VENOUS ACCESS DEVICE: IMG5775

## 2020-11-13 MED ORDER — FENTANYL CITRATE (PF) 100 MCG/2ML IJ SOLN
INTRAMUSCULAR | Status: AC
  Filled 2020-11-13: qty 2

## 2020-11-13 MED ORDER — SODIUM CHLORIDE 0.9 % IV SOLN
INTRAVENOUS | Status: DC
  Administered 2020-11-13: 1000 mL via INTRAVENOUS

## 2020-11-13 MED ORDER — FENTANYL CITRATE (PF) 100 MCG/2ML IJ SOLN
INTRAMUSCULAR | Status: AC | PRN
  Administered 2020-11-13 (×2): 50 ug via INTRAVENOUS

## 2020-11-13 MED ORDER — MIDAZOLAM HCL 2 MG/2ML IJ SOLN
INTRAMUSCULAR | Status: AC | PRN
  Administered 2020-11-13 (×2): 1 mg via INTRAVENOUS

## 2020-11-13 MED ORDER — MIDAZOLAM HCL 2 MG/2ML IJ SOLN
INTRAMUSCULAR | Status: AC
  Filled 2020-11-13: qty 2

## 2020-11-13 NOTE — Progress Notes (Signed)
Patient clinically stable post Port stripping per Dr Serafina Royals. Successful procedure, as port now has good blood return post procedure. Received Versed 2 mg along with Fentanyl 100 mcg IV for procedure. Denies complaints at this time. Report given to Genelle Bal Rn post procedure. Awake/alert and oriented post procedure.

## 2020-11-13 NOTE — Procedures (Signed)
Interventional Radiology Procedure Note  Procedure: Port stripping  Findings: Please refer to procedural dictation for full description. Small fibrin sheath affixed to catheter tip in the SVC.  Successful stripping of catheter tip with slight repositioning to more inferior in the SVC.  Port flushes and aspirates without difficulty.  Heparin locked.  Complications: None immediate  Estimated Blood Loss: < 5 mL  Recommendations: OK to use port per usual.   Ruthann Cancer, MD Pager: [phone]

## 2020-11-17 ENCOUNTER — Other Ambulatory Visit: Payer: Self-pay

## 2020-11-17 MED ORDER — FLUTICASONE-SALMETEROL 250-50 MCG/ACT IN AEPB
1.0000 | INHALATION_SPRAY | Freq: Two times a day (BID) | RESPIRATORY_TRACT | 8 refills | Status: DC | PRN
  Filled 2020-11-17: qty 60, 30d supply, fill #0
  Filled 2021-01-11: qty 60, 30d supply, fill #1

## 2020-11-17 MED FILL — Ergocalciferol Cap 1.25 MG (50000 Unit): ORAL | 84 days supply | Qty: 12 | Fill #0 | Status: AC

## 2020-11-19 NOTE — Progress Notes (Signed)
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  INTERVAL HISTORY: Patient returns to clinic today for further evaluation and consideration of cycle 3, day 1 of single agent gemcitabine.  She continues to have chronic weakness and fatigue, but this is improved.  She currently feels well.  She has no neurologic complaints.  She denies any recent fevers or illnesses.  She has a good appetite and denies weight loss.  She denies any chest pain, shortness of breath, cough, or hemoptysis.  She denies any nausea, vomiting, constipation, or diarrhea.  She has no urinary complaints.  Patient offers no further specific complaints today.  REVIEW OF SYSTEMS:   Review of Systems  Constitutional: Positive for malaise/fatigue. Negative for fever and weight loss.  Respiratory: Negative.  Negative for cough, hemoptysis and shortness of breath.   Cardiovascular: Negative.  Negative for chest pain and leg swelling.  Gastrointestinal: Negative.  Negative for abdominal pain, constipation, diarrhea and nausea.  Genitourinary: Negative.  Negative for dysuria and flank pain.  Musculoskeletal: Negative for back pain and joint pain.  Skin: Negative.  Negative for rash.  Neurological: Positive for weakness. Negative for dizziness, sensory change, focal weakness and headaches.  Psychiatric/Behavioral: Negative.  The patient is not nervous/anxious.     As per HPI. Otherwise, a complete review of systems is negative.  PAST MEDICAL HISTORY: Past Medical History:  Diagnosis Date  . Abnormal echocardiogram   . Acid reflux   . Allergic rhinitis   . Anemia    HAD TO HAVE IRON INFUSIONS BACK IN 2015  . Arthritis     bil hands and back  . Asthma   . Bloody discharge from left nipple   . Page of right lung ([place]) 2015   lung Page - chemo, Dr Genevive Bi removed RML Lobectomy.   . Colon polyps   . COPD (chronic obstructive pulmonary disease) ([place])   . Dyspnea    WITH EXERTION  . Family history of breast Page   . Family history of cervical Page   . Family history of colon Page   . Family history of skin Page   . Hypertension   . Malignant neoplasm of middle lobe of right lung ([place])   . Mitral valve prolapse   . Mitral valve prolapse   . Non-small cell lung Page ([place])   . Personal history of chemotherapy    F/U Lung Page  . Personal history of radiation therapy    Lung Page  . Primary Page of right middle lobe of lung ([place]) 03/11/2016  . Squamous cell carcinoma of skin 07/07/2009   right supraclavicular/EDC  . Squamous cell carcinoma of skin 11/04/2019   Left pretibial. [facility].  . Stage IV adenocarcinoma of lung, unspecified laterality ([place])     PAST SURGICAL HISTORY: Past Surgical History:  Procedure Laterality Date  . ABDOMINAL HYSTERECTOMY    . BAND HEMORRHOIDECTOMY    . BREAST BIOPSY Left 03/01/2019   Procedure: LEFT BREAST EXCISIONAL BIOPSY WITH NEEDLE LOCALIZATION;  Surgeon: Herbert Pun, MD;  Location: [facility] ORS;  Service: General;  Laterality: Left;  . BREAST EXCISIONAL BIOPSY Left 1994 (-), 2004 (-)   benign  . BREAST EXCISIONAL BIOPSY Left 03/01/2019   - INTRADUCTAL PAPILLOMA WITH  SCLEROSIS.   [facility] COLONOSCOPY  2008    Dr. Donnella Sham  . COLONOSCOPY WITH PROPOFOL N/A 04/12/2019   Procedure: COLONOSCOPY WITH PROPOFOL;  Surgeon: Lollie Sails, MD;  Location: [facility] ENDOSCOPY;  Service: Endoscopy;  Laterality: N/A;  . IR PORT REPAIR CENTRAL VENOUS ACCESS DEVICE  11/13/2020  . IR RADIOLOGIST EVAL & MGMT  08/28/2019  . IR RADIOLOGIST EVAL & MGMT  10/30/2019  . IR RADIOLOGIST EVAL & MGMT  11/26/2019  . KNEE SURGERY  2007  . LUNG LOBECTOMY Right    middle lobe  .  PORTACATH PLACEMENT Right 02/09/2017   Procedure: INSERTION PORT-A-CATH;  Surgeon: Nestor Lewandowsky, MD;  Location: [facility] ORS;  Service: General;  Laterality: Right;  . RADIOLOGY WITH ANESTHESIA N/A 10/02/2019   Procedure: MICROWAVE THERMA ABLATION;  Surgeon: Aletta Edouard, MD;  Location: [facility] ORS;  Service: Radiology;  Laterality: N/A;  . SALIVARY GLAND SURGERY Left 1983   with lymph node removal also  . SALIVARY GLAND SURGERY     salivary gland removal   . UPPER GI ENDOSCOPY  2008  . VIDEO BRONCHOSCOPY WITH ENDOBRONCHIAL ULTRASOUND Right 01/24/2017   Procedure: VIDEO BRONCHOSCOPY WITH ENDOBRONCHIAL ULTRASOUND;  Surgeon: [name], MD;  Location: [facility] ORS;  Service: Pulmonary;  Laterality: Right;    FAMILY HISTORY: Family History  Problem Relation Age of Onset  . Breast Page Maternal Aunt 60  . Alzheimer's disease Maternal Aunt   . Breast Page Paternal Aunt        dx. in her late 42s  . Breast Page Maternal Grandmother        dx. in her 56s  . Cervical Page Maternal Grandmother 60  . Colon Page Maternal Grandmother        dx. in her 14s  . Breast Page Maternal Aunt 60  . Alzheimer's disease Maternal Aunt   . Breast Page Paternal Aunt        dx. in her late 23s  . Heart disease Paternal Aunt   . Healthy Mother   . Healthy Father   . Colon Page Father 86  . Skin Page Father   . Heart attack Father   . Heart attack Paternal Grandfather   . Skin Page Paternal Aunt   . Heart attack Paternal Uncle 29  . Heart attack Paternal Uncle     ADVANCED DIRECTIVES (Y/N):  N  HEALTH MAINTENANCE: Social History   Tobacco Use  . Smoking status: Never Smoker  . Smokeless tobacco: Never Used  Vaping Use  . Vaping Use: Never used  Substance Use Topics  . Alcohol use: Yes    Alcohol/week: 7.0 standard drinks    Types: 7 Glasses of wine per week  . Drug use: No     Colonoscopy:  PAP:  Bone density:  Lipid panel:  Allergies  Allergen Reactions  . Other  Other (See Comments)    Cat gut sutures get infected Cat gut sutures get infected  . Penicillins Swelling and Rash    Did it involve swelling of the face/tongue/throat, SOB, or low BP? Unknown Did it involve sudden or severe rash/hives, skin peeling, or any reaction on the inside of your mouth or nose? No Did you need to seek medical attention at a hospital or doctor's office? Yes When did it last happen?30 + years  If all above answers are "NO", may proceed with cephalosporin use.    . Sulfa Antibiotics Nausea And Vomiting    Current Outpatient Medications  Medication Sig Dispense Refill  .  albuterol (VENTOLIN HFA) 108 (90 Base) MCG/ACT inhaler Inhale 1-2 puffs into the lungs every 6 (six) hours as needed for wheezing or shortness of breath. 1 g 10  . ALPRAZolam (XANAX) 0.25 MG tablet Take 1 tablet (0.25 mg total) by mouth at bedtime as needed for anxiety. 30 tablet 0  . ALPRAZolam (XANAX) 0.5 MG tablet take 1-2 tablets by mouth at night as needed for sleep 60 tablet 3  . ascorbic acid (VITAMIN C) 100 MG tablet Take 100 mg by mouth daily.     . B Complex Vitamins (VITAMIN B COMPLEX PO) Take 1 tablet by mouth daily.     . Biotin w/ Vitamins C & E (HAIR/SKIN/NAILS PO) Take 1 tablet by mouth daily.    . Calcium Carb-Cholecalciferol 600-800 MG-UNIT TABS Take 2 tablets by mouth daily.    . Cholecalciferol 125 MCG (5000 UT) capsule Take 5,000 Units by mouth daily.    . clobetasol ointment (TEMOVATE) 3.30 % Apply 1 application topically 2 (two) times daily. Dry area and apply to mouth sores twice daily as needed for up to 2 weeks. 15 g 0  . COLLAGEN PO Take 1,000 mg by mouth daily.    . cyclobenzaprine (FLEXERIL) 5 MG tablet Take 1 tablet (5 mg total) by mouth 3 (three) times daily as needed for muscle spasms. 30 tablet 0  . fexofenadine (ALLEGRA) 180 MG tablet Take 180 mg by mouth daily as needed for allergies.     . fluticasone (FLONASE) 50 MCG/ACT nasal spray Place 2 sprays into both  nostrils daily as needed for allergies or rhinitis.    . fluticasone-salmeterol (ADVAIR) 250-50 MCG/ACT AEPB Inhale 1 puff into the lungs 2 (two) times daily as needed for breathing issues. 60 each 8  . Fluticasone-Salmeterol (ADVAIR) 250-50 MCG/DOSE AEPB INHALE 1 PUFF BY MOUTH 2 TIMES DAILY AS NEEDED FOR BREATHING ISSUES 60 each 10  . ibuprofen (ADVIL) 800 MG tablet TAKE 1 TABLET BY MOUTH EVERY 8 HOURS AS NEEDED FOR PAIN **ALWAYS TAKE WITH FOOD** 90 tablet 2  . lansoprazole (PREVACID) 30 MG capsule TAKE 1 CAPSULE BY MOUTH TWICE DAILY 180 capsule 3  . lidocaine-prilocaine (EMLA) cream Apply 1 application topically as needed. Apply generously over the Mediport 45 minutes prior to chemotherapy. 30 g 0  . magic mouthwash SOLN Take 5-10 mLs by mouth 4 (four) times daily as needed for mouth pain. 480 mL 0  . ondansetron (ZOFRAN) 8 MG tablet Take 1 tablet (8 mg total) by mouth every 8 (eight) hours as needed for nausea or vomiting (start 3 days; after chemo). 40 tablet 1  . osimertinib mesylate (TAGRISSO) 80 MG tablet Take 80 mg by mouth daily.    . polyethylene glycol (MIRALAX / GLYCOLAX) packet Take 17 g by mouth daily as needed for mild constipation.    . prochlorperazine (COMPAZINE) 10 MG tablet Take 1 tablet (10 mg total) by mouth every 6 (six) hours as needed for nausea or vomiting. 60 tablet 2  . triamcinolone (KENALOG) 0.1 % paste Use as directed 1 application in the mouth or throat 2 (two) times daily. 15 g 12  . Vitamin D, Ergocalciferol, (DRISDOL) 1.25 MG (50000 UNIT) CAPS capsule TAKE 1 CAPSULE (50,000 UNITS TOTAL) BY MOUTH ONCE A WEEK 12 capsule 3  . zinc gluconate 50 MG tablet Take 50 mg by mouth daily.     No current facility-administered medications for this visit.   Facility-Administered Medications Ordered in Other Visits  Medication Dose Route Frequency Provider Last  Rate Last Admin  . heparin lock flush 100 unit/mL  500 Units Intravenous Once Lloyd Huger, MD      . sodium  chloride flush (NS) 0.9 % injection 10 mL  10 mL Intravenous PRN Lloyd Huger, MD      . sodium chloride flush (NS) 0.9 % injection 10 mL  10 mL Intravenous PRN Lloyd Huger, MD   10 mL at 01/22/18 1419    OBJECTIVE: Vitals:   11/26/20 1111  BP: 116/83  Pulse: (!) 105  Resp: 18  Temp: 99.3 F (37.4 C)  SpO2: 100%     Body mass index is 20.42 kg/m.    ECOG FS:1 - Symptomatic but completely ambulatory  General: Well-developed, well-nourished, no acute distress. Eyes: Pink conjunctiva, anicteric sclera. HEENT: Normocephalic, moist mucous membranes. Lungs: No audible wheezing or coughing. Heart: Regular rate and rhythm. Abdomen: Soft, nontender, no obvious distention. Musculoskeletal: No edema, cyanosis, or clubbing. Neuro: Alert, answering all questions appropriately. Cranial nerves grossly intact. Skin: No rashes or petechiae noted. Psych: Normal affect.   LAB RESULTS:  Lab Results  Component Value Date   NA 135 11/26/2020   K 4.0 11/26/2020   CL 101 11/26/2020   CO2 23 11/26/2020   GLUCOSE 113 (H) 11/26/2020   BUN 15 11/26/2020   CREATININE 0.98 11/26/2020   CALCIUM 9.0 11/26/2020   PROT 7.1 11/26/2020   ALBUMIN 3.3 (L) 11/26/2020   AST 25 11/26/2020   ALT 16 11/26/2020   ALKPHOS 137 (H) 11/26/2020   BILITOT 0.4 11/26/2020   GFRNONAA >60 11/26/2020   GFRAA >60 03/23/2020    Lab Results  Component Value Date   WBC 6.7 11/26/2020   NEUTROABS 5.2 11/26/2020   HGB 9.0 (L) 11/26/2020   HCT 28.4 (L) 11/26/2020   MCV 85.8 11/26/2020   PLT 303 11/26/2020     STUDIES: DG Fluoro Guide CV Line Right  Result Date: 11/12/2020 INDICATION: 70 year old female with history of non-small cell lung Page and right internal jugular vein indwelling Port-A-Cath placed by Dr. Magdalen Spatz on 02/09/2017. The port has functioned perfectly until recently with inability to aspirate. EXAM: FLUOROSCOPIC GUIDED PORT A CATHETER CHECK MEDICATIONS: None. CONTRAST:  None  FLUOROSCOPY TIME:  0.3 minutes (63.0 mGy) COMPLICATIONS: None immediate. TECHNIQUE: The procedure, risks, benefits, and alternatives were explained to the patient and informed written consent was obtained. A timeout was performed prior to the initiation of the procedure. The patient's chest port a catheter was accessed by the infusion center prior to arrival. The patient was placed supine on the fluoroscopy table. A preprocedural spot fluoroscopic image was obtained of the chest in existing port a catheter. Note was made of difficulty aspirating blood from the port a catheter. Contrast was injected via the Port a catheter and images were reviewed. The Port a catheter was flushed with a heparin dwell and de accessed. A dressing was placed. The patient tolerated the procedure well without immediate postprocedural complication. FINDINGS: Unchanged positioning of anterior chest wall right internal jugular vein approach port a catheter with tip projected over the expected location of the central SVC. There was difficulty aspirating blood from the port a catheter. Contrast injection demonstrated the presence of a fibrin sheath about the distal end of the Port a catheter which was located within the central aspect of the SVC. There is no evidence of catheter kink or fracture. No contrast extravasation. IMPRESSION: Difficulty aspirating of the Port a catheter is secondary to the presence of a fibrin  sheath. 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 Above findings were discussed and the following options were provided to the patient: 1. Maintain the Port a catheter as is, realizing it may be difficulty for blood draws, but could be used for medication administration. 2. Attempt to perform a fibrin sheath stripping from the port a catheter tip via a right common femoral vein approach. 3. Replace the subclavian vein approach port a catheter with an IJ approach catheter with tip terminating within the superior aspect  of the right atrium. After discussion of all potential treatment option, the patient has elected to pursue port stripping. This was discussed with Dr. Grayland Ormond who is in agreement with this plan of care. As such, the procedure will be scheduled for 11/14/19. Lauren Cancer, MD [facility] Radiology Electronically Signed   By: Lauren Cancer MD   On: 11/12/2020 14:48   IR Port Repair Central Venous Access Device  Result Date: 11/13/2020 INDICATION: 70 year old female with history of non-small cell lung Page and indwelling right internal jugular vein Port-A-Cath placed by Dr. Genevive Bi on 02/09/2017 with inability to aspirate from the port due to fibrin sheath identified on port check yesterday. EXAM: 1. Ultrasound-guided access of the right common femoral vein. 2. Intravascular ultrasound evaluation of the central veins. 3. Port-A-Cath stripping. COMPARISON:  11/12/2020 MEDICATIONS: None. ANESTHESIA/SEDATION: Moderate (conscious) sedation was employed during this procedure. A total of Versed 2 mg and Fentanyl 100 mcg was administered intravenously. Moderate Sedation Time: 43 minutes. The patient's level of consciousness and vital signs were monitored continuously by radiology nursing throughout the procedure under my direct supervision. CONTRAST:  None FLUOROSCOPY TIME:  14.5 minutes minutes, (66.4 mGy) COMPLICATIONS: None immediate. PROCEDURE: The procedure, risks, benefits, and alternatives were explained to the patient. Questions regarding the procedure were encouraged and answered. The patient understands and consents to the procedure. The right neck and right groin were prepped with chlorhexidine in a sterile fashion, and a sterile drape was applied covering the operative field. Maximum barrier sterile technique with sterile gowns and gloves were used for the procedure. A timeout was performed prior to the initiation of the procedure. The indwelling Port-A-Cath was  then accessed without difficulty. The port once again was able to be flushed but not aspirated. Next, ultrasound was used to study the right common femoral vein. The right common femoral vein was widely patent and free of internal echoes. Vena puncture site was planned. Subdermal Local anesthesia was administered 1% lidocaine at the planned needle entry site. A small skin nick was made. Under direct ultrasound visualization, the right common femoral vein was accessed with ultrasound guidance. A permanent image was stored and placed in the imaging record. A micropuncture set was then use to dilate up to a 0.035" system and a Rosen wire was directed to the inferior vena cava under fluoroscopic guidance. Serial dilation was performed and an 8 [place], 65 cm sheath was then placed over the Rosen wire with the tip in the perihepatic inferior vena cava. A Glidewire and angled tip catheter were then used to catheterize the superior vena cava. The Glidewire was exchanged for the [facility] wire and the catheter was removed. An intravascular ultrasound was then advanced over the wire to evaluate the central veins. The central inferior vena cava is patent. The right atrium is widely patent. The superior vena cava is patent but small caliber and ovoid, likely secondary to extrinsic compression from pulmonary mass. There is an echogenic thrombus affixed  to the inferior aspect of the indwelling Port-A-Cath catheter tip which is positioned in the superior aspect of the superior vena cava. Peripheral to the port catheter tip, the superior vena cava and right brachiocephalic vein are widely patent and normal in caliber. The intravascular ultrasound was then removed and a 6 French gooseneck snare was directed to the superior vena cava. After multiple passes, the gooseneck snare was unable to capture the Port-A-Cath tip. Therefore, a 6 [place], tri lobed ensnare device was then used which was able to capture the catheter tip. Three  stripping passes were then performed which in turn reposition the catheter tip approximately 6 cm inferior to its previous position. The Port-A-Cath in aspirated and flushed without difficulty. Intravascular ultrasound was then placed over the wire for post stripping evaluation. There is no evidence of residual fibrin sheath. The catheter tip was floating freely within the mid to central superior vena cava. No traditional venogram was performed due to adequate evaluation with intravascular ultrasound and current global iodinated contrast shortage. The catheters and wires were then removed. Manual compression was applied at the groin site for 5 minutes to achieve hemostasis. The catheter was then flushed/blocked with heparinized saline and the indwelling Huber needle was removed. The patient tolerated procedure well without immediate complication. FINDINGS: Focal fibrin sheath about the catheter tip. Catheter tip in the superior aspect of the superior vena cava. IMPRESSION: Successful fluoroscopic and intravascular ultrasound guided port catheter tip stripping with removal of fibrin sheath. The catheter tip was repositioned approximately 6 cm inferior from prior indwelling position, now within the central aspect of the SVC. PLAN: Proceed with Port-A-Cath use per usual. 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  She initially received 4 cycles of adjuvant cisplatin and Alimta completing on January 23, 2014.  Patient was on the [facility] protocol for maintenance Tarceva versus placebo, but discontinued treatment in November 2015 because of significant side effects.  Although blinded, presumption was she was receiving Tarceva.  She recently was noted to have a recurrence confirmed by right  paratracheal lymph node biopsy on January 24, 2017.  PET scan on February 03, 2017 did not reveal any distant metastasis.  She underwent treatment with weekly carboplatinum and Taxol along with daily XRT finishing in October 2018.  She initiated maintenance durvalumab on June 29, 2017.  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  Patient received Tagrisso from April 2019 through September 2021. PET scan results from July 09, 2019 revealed mild progression of disease in her lungs with an isolated left kidney metastasis that has undergone ablation. PET scan on March 11, 2020 revealed continued progression of disease in her lungs.    She subsequently discontinued Tagrisso and initiated single agent pemetrexed on March 23, 2020.    Patient enrolled in clinical trial at [facility] between December 2021 and April 2022.  She initiated single agent gemcitabine on October 15, 2020.   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  PLAN:   1. 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  Patient's most recent imaging with CT scan at [facility] on October 05, 2020 revealed new and increasing size of bilateral pulmonary nodules, bilateral axillary  and left supraclavicular lymphadenopathy, and interval enlargement of multiple lesions throughout left and right lobes of liver.  MRI of the brain also on October 05, 2020 revealed stable metastatic lesions.  Because of progression of disease, patient discontinued clinical trial and now will initiate single agent gemcitabine on days 1 and 8 with day 15 off.  Proceed with cycle 3, day 1 of treatment today.  Return to clinic in 1 week for further evaluation and consideration of cycle 3, day 8.  Plan was to reimage at the conclusion of cycle 4, but patient has requested imaging sooner so will consider after cycle 3.  2.  Diarrhea: Resolved.   Continue Lomotil as needed. 3.  Cough: Patient does not complain of this today.  Monitor.   4.  Breast lesion: Benign papilloma.  Patient underwent lumpectomy on March 01, 2019.  Her most recent mammogram on March 03, 2020 was reported as BI-RADS 2.   5.  Anemia: Chronic and unchanged.  Patient's hemoglobin is 9.0. 6.  Renal insufficiency: Resolved. 7.  Leukopenia: Resolved. 8.  Nausea: Patient does not complain of this today.  Continue Compazine as needed.  Patient expressed understanding and was in agreement with this plan. She also understands that She can call clinic at any time with any questions, concerns, or complaints.    Lloyd Huger, MD   11/26/2020 3:53 PM

## 2020-11-26 ENCOUNTER — Inpatient Hospital Stay (HOSPITAL_BASED_OUTPATIENT_CLINIC_OR_DEPARTMENT_OTHER): Payer: Medicare Other | Admitting: Oncology

## 2020-11-26 ENCOUNTER — Encounter: Payer: Self-pay | Admitting: Oncology

## 2020-11-26 ENCOUNTER — Ambulatory Visit
Admission: RE | Admit: 2020-11-26 | Discharge: 2020-11-26 | Disposition: A | Discharge status: Home / Self Care | Payer: Medicare Other | Source: Ambulatory Visit | Attending: Radiation Oncology | Admitting: Radiation Oncology

## 2020-11-26 ENCOUNTER — Inpatient Hospital Stay: Payer: Medicare Other

## 2020-11-26 ENCOUNTER — Other Ambulatory Visit: Payer: Self-pay

## 2020-11-26 VITALS — BP 116/83 | HR 105 | Temp 99.3°F | Resp 18 | Ht 63.0 in | Wt 115.3 lb

## 2020-11-26 DIAGNOSIS — C7902 Secondary malignant neoplasm of left kidney and renal pelvis: Secondary | ICD-10-CM | POA: Insufficient documentation

## 2020-11-26 DIAGNOSIS — C787 Secondary malignant neoplasm of liver and intrahepatic bile duct: Secondary | ICD-10-CM | POA: Insufficient documentation

## 2020-11-26 DIAGNOSIS — C3490 Malignant neoplasm of unspecified part of unspecified bronchus or lung: Secondary | ICD-10-CM

## 2020-11-26 DIAGNOSIS — C342 Malignant neoplasm of middle lobe, bronchus or lung: Secondary | ICD-10-CM

## 2020-11-26 DIAGNOSIS — Z5111 Encounter for antineoplastic chemotherapy: Secondary | ICD-10-CM | POA: Diagnosis not present

## 2020-11-26 DIAGNOSIS — C7951 Secondary malignant neoplasm of bone: Secondary | ICD-10-CM | POA: Insufficient documentation

## 2020-11-26 DIAGNOSIS — C778 Secondary and unspecified malignant neoplasm of lymph nodes of multiple regions: Secondary | ICD-10-CM | POA: Insufficient documentation

## 2020-11-26 DIAGNOSIS — C7931 Secondary malignant neoplasm of brain: Secondary | ICD-10-CM | POA: Insufficient documentation

## 2020-11-26 LAB — CBC WITH DIFFERENTIAL/PLATELET
Abs Immature Granulocytes: 0.04 10*3/uL (ref 0.00–0.07)
Basophils Absolute: 0.1 10*3/uL (ref 0.0–0.1)
Basophils Relative: 1 %
Eosinophils Absolute: 0.3 10*3/uL (ref 0.0–0.5)
Eosinophils Relative: 4 %
HCT: 28.4 % — ABNORMAL LOW (ref 36.0–46.0)
Hemoglobin: 9 g/dL — ABNORMAL LOW (ref 12.0–15.0)
Immature Granulocytes: 1 %
Lymphocytes Relative: 8 %
Lymphs Abs: 0.5 10*3/uL — ABNORMAL LOW (ref 0.7–4.0)
MCH: 27.2 pg (ref 26.0–34.0)
MCHC: 31.7 g/dL (ref 30.0–36.0)
MCV: 85.8 fL (ref 80.0–100.0)
Monocytes Absolute: 0.6 10*3/uL (ref 0.1–1.0)
Monocytes Relative: 9 %
Neutro Abs: 5.2 10*3/uL (ref 1.7–7.7)
Neutrophils Relative %: 77 %
Platelets: 303 10*3/uL (ref 150–400)
RBC: 3.31 MIL/uL — ABNORMAL LOW (ref 3.87–5.11)
RDW: 22.5 % — ABNORMAL HIGH (ref 11.5–15.5)
WBC: 6.7 10*3/uL (ref 4.0–10.5)
nRBC: 0 % (ref 0.0–0.2)

## 2020-11-26 LAB — COMPREHENSIVE METABOLIC PANEL
ALT: 16 U/L (ref 0–44)
AST: 25 U/L (ref 15–41)
Albumin: 3.3 g/dL — ABNORMAL LOW (ref 3.5–5.0)
Alkaline Phosphatase: 137 U/L — ABNORMAL HIGH (ref 38–126)
Anion gap: 11 (ref 5–15)
BUN: 15 mg/dL (ref 8–23)
CO2: 23 mmol/L (ref 22–32)
Calcium: 9 mg/dL (ref 8.9–10.3)
Chloride: 101 mmol/L (ref 98–111)
Creatinine, Ser: 0.98 mg/dL (ref 0.44–1.00)
GFR, Estimated: >60 mL/min (ref >60)
Glucose, Bld: 113 mg/dL — ABNORMAL HIGH (ref 70–99)
Potassium: 4 mmol/L (ref 3.5–5.1)
Sodium: 135 mmol/L (ref 135–145)
Total Bilirubin: 0.4 mg/dL (ref 0.3–1.2)
Total Protein: 7.1 g/dL (ref 6.5–8.1)

## 2020-11-26 LAB — PHOSPHORUS: Phosphorus: 4.1 mg/dL (ref 2.5–4.6)

## 2020-11-26 LAB — MAGNESIUM: Magnesium: 2.1 mg/dL (ref 1.7–2.4)

## 2020-11-26 MED ORDER — HEPARIN SOD (PORK) LOCK FLUSH 100 UNIT/ML IV SOLN
INTRAVENOUS | Status: AC
  Filled 2020-11-26: qty 5

## 2020-11-26 MED ORDER — SODIUM CHLORIDE 0.9 % IV SOLN
Freq: Once | INTRAVENOUS | Status: AC
  Filled 2020-11-26: qty 250

## 2020-11-26 MED ORDER — PROCHLORPERAZINE MALEATE 10 MG PO TABS
10.0000 mg | ORAL_TABLET | Freq: Once | ORAL | Status: AC
  Administered 2020-11-26: 10 mg via ORAL
  Filled 2020-11-26: qty 1

## 2020-11-26 MED ORDER — SODIUM CHLORIDE 0.9 % IV SOLN
1600.0000 mg | Freq: Once | INTRAVENOUS | Status: AC
  Administered 2020-11-26: 1600 mg via INTRAVENOUS
  Filled 2020-11-26: qty 26.3

## 2020-11-26 MED ORDER — HEPARIN SOD (PORK) LOCK FLUSH 100 UNIT/ML IV SOLN
500.0000 [IU] | Freq: Once | INTRAVENOUS | Status: AC | PRN
  Administered 2020-11-26: 500 [IU]
  Filled 2020-11-26: qty 5

## 2020-11-26 NOTE — Progress Notes (Signed)
Radiation Oncology Follow up Note  Name: Lauren Page   Date:   11/26/2020 MRN:  [id number] DOB: [birth year]    This 70 y.o. 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  She has documented disease in the left supraclavicular fossa liver bilateral pulmonary nodules bilateral axillary and left supraclavicular adenopathy.  MRI of the brain in April 2022 showed stable metastatic lesions.  She is currently on single agent gemcitabine as part of recommendation from [facility] pulmonology.  She states she is having no real pain no cough hemoptysis or chest tightness.  She is having no pain in the left supraclavicular region.  She is having no neurologic complaints at this time.  Her most recent MRI scan of the brain at [facility] showed Unchanged contrast-enhancing right frontal and right temporal lobe  lesions compared to most recent prior study.  COMPLICATIONS OF TREATMENT: none  FOLLOW UP COMPLIANCE: keeps appointments   PHYSICAL EXAM:  There were no vitals taken for this visit. Well-developed well-nourished patient in NAD. HEENT reveals PERLA, EOMI, discs not visualized.  Oral cavity is clear. No oral mucosal lesions are identified. Neck is clear without evidence of cervical or supraclavicular adenopathy. Lungs are clear to A&P. Cardiac examination is essentially unremarkable with regular rate and rhythm without murmur rub or thrill. Abdomen is benign with no organomegaly or masses noted. Motor sensory and DTR levels are equal and symmetric in the upper and lower extremities. Cranial nerves II through XII are grossly intact. Proprioception is intact. No peripheral  adenopathy or edema is identified. No motor or sensory levels are noted. Crude visual fields are within normal range.  RADIOLOGY RESULTS: PET scan back in November as well as recent MRI scan of brain reviewed compatible with above-stated findings  PLAN: At this time patient is currently on single agent gemcitabine under the direction and recommendations of [facility].  She is fairly stable clinically.  She has no areas that I would recommend palliative radiation therapy to.  Again based on stable appearance of her brain lesions would not offer radiation therapy at this time.  We will be happy to reevaluate her anytime she palliative treatment be indicated.  Of asked to see her back in 6 months for follow-up.  I would like to take this opportunity to thank you for allowing me to participate in the care of your patient.Noreene Filbert, MD

## 2020-11-26 NOTE — Progress Notes (Signed)
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  INTERVAL HISTORY: Patient returns to clinic today for further evaluation and consideration of cycle 3, day 8 of single agent gemcitabine.  She continues to have chronic weakness and fatigue, but otherwise is tolerating her treatments well.  She has no neurologic complaints.  She denies any recent fevers or illnesses.  She has a good appetite and denies weight loss.  She denies any chest pain, shortness of breath, cough, or hemoptysis.  She denies any nausea, vomiting, constipation, or diarrhea.  She has no urinary complaints.  Patient offers no further specific complaints today.  REVIEW OF SYSTEMS:   Review of Systems  Constitutional: Positive for malaise/fatigue. Negative for fever and weight loss.  Respiratory: Negative.  Negative for cough, hemoptysis and shortness of breath.   Cardiovascular: Negative.  Negative for chest pain and leg swelling.  Gastrointestinal: Negative.  Negative for abdominal pain, constipation, diarrhea and nausea.  Genitourinary: Negative.  Negative for dysuria and flank pain.  Musculoskeletal: Negative for back pain and joint pain.  Skin: Negative.  Negative for rash.  Neurological: Positive for weakness. Negative for dizziness, sensory change, focal weakness and headaches.  Psychiatric/Behavioral: Negative.  The patient is not nervous/anxious.     As per HPI. Otherwise, a complete review of systems is negative.  PAST MEDICAL HISTORY: Past Medical History:  Diagnosis Date  . Abnormal echocardiogram   . Acid reflux   . Allergic rhinitis   . Anemia    HAD TO HAVE IRON INFUSIONS BACK IN 2015  . Arthritis     bil hands and back  . Asthma   . Bloody discharge from left nipple   . Page of right lung ([place]) 2015   lung Page - chemo, Dr Genevive Bi removed RML Lobectomy.   . Colon polyps   . COPD (chronic obstructive pulmonary disease) ([place])   . Dyspnea    WITH EXERTION  . Family history of breast Page   . Family history of cervical Page   . Family history of colon Page   . Family history of skin Page   . Hypertension   . Malignant neoplasm of middle lobe of right lung ([place])   . Mitral valve prolapse   . Mitral valve prolapse   . Non-small cell lung Page ([place])   . Personal history of chemotherapy    F/U Lung Page  . Personal history of radiation therapy    Lung Page  . Primary Page of right middle lobe of lung ([place]) 03/11/2016  . Squamous cell carcinoma of skin 07/07/2009   right supraclavicular/EDC  . Squamous cell carcinoma of skin 11/04/2019   Left pretibial. [facility].  . Stage IV adenocarcinoma of lung, unspecified laterality ([place])     PAST SURGICAL HISTORY: Past Surgical History:  Procedure Laterality Date  . ABDOMINAL HYSTERECTOMY    . BAND HEMORRHOIDECTOMY    . BREAST BIOPSY Left 03/01/2019   Procedure: LEFT BREAST EXCISIONAL BIOPSY WITH NEEDLE LOCALIZATION;  Surgeon: Herbert Pun, MD;  Location: [facility] ORS;  Service: General;  Laterality: Left;  . BREAST EXCISIONAL BIOPSY Left 1994 (-), 2004 (-)   benign  . BREAST EXCISIONAL BIOPSY Left 03/01/2019   - INTRADUCTAL PAPILLOMA WITH SCLEROSIS.   Marland Kitchen  COLONOSCOPY  2008    Dr. Donnella Sham  . COLONOSCOPY WITH PROPOFOL N/A 04/12/2019   Procedure: COLONOSCOPY WITH PROPOFOL;  Surgeon: Lollie Sails, MD;  [facility];  Service: Endoscopy;  Laterality: N/A;  . IR PORT REPAIR CENTRAL VENOUS ACCESS DEVICE  11/13/2020  . IR RADIOLOGIST EVAL & MGMT  08/28/2019  . IR RADIOLOGIST EVAL & MGMT  10/30/2019  . IR RADIOLOGIST EVAL & MGMT  11/26/2019  . KNEE SURGERY  2007  . LUNG LOBECTOMY Right    middle lobe  .  PORTACATH PLACEMENT Right 02/09/2017   Procedure: INSERTION PORT-A-CATH;  Surgeon: Nestor Lewandowsky, MD;  Location: [facility] ORS;  Service: General;  Laterality: Right;  . RADIOLOGY WITH ANESTHESIA N/A 10/02/2019   Procedure: MICROWAVE THERMA ABLATION;  Surgeon: Aletta Edouard, MD;  Location: [facility] ORS;  Service: Radiology;  Laterality: N/A;  . SALIVARY GLAND SURGERY Left 1983   with lymph node removal also  . SALIVARY GLAND SURGERY     salivary gland removal   . UPPER GI ENDOSCOPY  2008  . VIDEO BRONCHOSCOPY WITH ENDOBRONCHIAL ULTRASOUND Right 01/24/2017   Procedure: VIDEO BRONCHOSCOPY WITH ENDOBRONCHIAL ULTRASOUND;  Surgeon: [name], MD;  Location: [facility] ORS;  Service: Pulmonary;  Laterality: Right;    FAMILY HISTORY: Family History  Problem Relation Age of Onset  . Breast Page Maternal Aunt 60  . Alzheimer's disease Maternal Aunt   . Breast Page Paternal Aunt        dx. in her late 72s  . Breast Page Maternal Grandmother        dx. in her 46s  . Cervical Page Maternal Grandmother 60  . Colon Page Maternal Grandmother        dx. in her 52s  . Breast Page Maternal Aunt 60  . Alzheimer's disease Maternal Aunt   . Breast Page Paternal Aunt        dx. in her late 76s  . Heart disease Paternal Aunt   . Healthy Mother   . Healthy Father   . Colon Page Father 21  . Skin Page Father   . Heart attack Father   . Heart attack Paternal Grandfather   . Skin Page Paternal Aunt   . Heart attack Paternal Uncle 53  . Heart attack Paternal Uncle     ADVANCED DIRECTIVES (Y/N):  N  HEALTH MAINTENANCE: Social History   Tobacco Use  . Smoking status: Never Smoker  . Smokeless tobacco: Never Used  Vaping Use  . Vaping Use: Never used  Substance Use Topics  . Alcohol use: Yes    Alcohol/week: 7.0 standard drinks    Types: 7 Glasses of wine per week  . Drug use: No     Colonoscopy:  PAP:  Bone density:  Lipid panel:  Allergies  Allergen Reactions  . Other  Other (See Comments)    Cat gut sutures get infected Cat gut sutures get infected  . Penicillins Swelling and Rash    Did it involve swelling of the face/tongue/throat, SOB, or low BP? Unknown Did it involve sudden or severe rash/hives, skin peeling, or any reaction on the inside of your mouth or nose? No Did you need to seek medical attention at a hospital or doctor's office? Yes When did it last happen?30 + years  If all above answers are "NO", may proceed with cephalosporin use.    . Sulfa Antibiotics Nausea And Vomiting    Current Outpatient Medications  Medication Sig Dispense Refill  . albuterol (VENTOLIN HFA)  108 (90 Base) MCG/ACT inhaler Inhale 1-2 puffs into the lungs every 6 (six) hours as needed for wheezing or shortness of breath. 1 g 10  . ALPRAZolam (XANAX) 0.25 MG tablet Take 1 tablet (0.25 mg total) by mouth at bedtime as needed for anxiety. 30 tablet 0  . ALPRAZolam (XANAX) 0.5 MG tablet take 1-2 tablets by mouth at night as needed for sleep 60 tablet 3  . ascorbic acid (VITAMIN C) 100 MG tablet Take 100 mg by mouth daily.     . B Complex Vitamins (VITAMIN B COMPLEX PO) Take 1 tablet by mouth daily.     . Biotin w/ Vitamins C & E (HAIR/SKIN/NAILS PO) Take 1 tablet by mouth daily.    . Calcium Carb-Cholecalciferol 600-800 MG-UNIT TABS Take 2 tablets by mouth daily.    . Cholecalciferol 125 MCG (5000 UT) capsule Take 5,000 Units by mouth daily.    . clobetasol ointment (TEMOVATE) 1.32 % Apply 1 application topically 2 (two) times daily. Dry area and apply to mouth sores twice daily as needed for up to 2 weeks. 15 g 0  . COLLAGEN PO Take 1,000 mg by mouth daily.    . cyclobenzaprine (FLEXERIL) 5 MG tablet Take 1 tablet (5 mg total) by mouth 3 (three) times daily as needed for muscle spasms. 30 tablet 0  . fexofenadine (ALLEGRA) 180 MG tablet Take 180 mg by mouth daily as needed for allergies.     . fluticasone (FLONASE) 50 MCG/ACT nasal spray Place 2 sprays into both  nostrils daily as needed for allergies or rhinitis.    . fluticasone-salmeterol (ADVAIR) 250-50 MCG/ACT AEPB Inhale 1 puff into the lungs 2 (two) times daily as needed for breathing issues. 60 each 8  . Fluticasone-Salmeterol (ADVAIR) 250-50 MCG/DOSE AEPB INHALE 1 PUFF BY MOUTH 2 TIMES DAILY AS NEEDED FOR BREATHING ISSUES 60 each 10  . ibuprofen (ADVIL) 800 MG tablet TAKE 1 TABLET BY MOUTH EVERY 8 HOURS AS NEEDED FOR PAIN **ALWAYS TAKE WITH FOOD** 90 tablet 2  . lansoprazole (PREVACID) 30 MG capsule TAKE 1 CAPSULE BY MOUTH TWICE DAILY 180 capsule 3  . lidocaine-prilocaine (EMLA) cream Apply 1 application topically as needed. Apply generously over the Mediport 45 minutes prior to chemotherapy. 30 g 0  . magic mouthwash SOLN Take 5-10 mLs by mouth 4 (four) times daily as needed for mouth pain. 480 mL 0  . ondansetron (ZOFRAN) 8 MG tablet Take 1 tablet (8 mg total) by mouth every 8 (eight) hours as needed for nausea or vomiting (start 3 days; after chemo). 40 tablet 1  . osimertinib mesylate (TAGRISSO) 80 MG tablet Take 80 mg by mouth daily.    . polyethylene glycol (MIRALAX / GLYCOLAX) packet Take 17 g by mouth daily as needed for mild constipation.    . prochlorperazine (COMPAZINE) 10 MG tablet Take 1 tablet (10 mg total) by mouth every 6 (six) hours as needed for nausea or vomiting. 60 tablet 2  . triamcinolone (KENALOG) 0.1 % paste Use as directed 1 application in the mouth or throat 2 (two) times daily. 15 g 12  . Vitamin D, Ergocalciferol, (DRISDOL) 1.25 MG (50000 UNIT) CAPS capsule TAKE 1 CAPSULE (50,000 UNITS TOTAL) BY MOUTH ONCE A WEEK 12 capsule 3  . zinc gluconate 50 MG tablet Take 50 mg by mouth daily.     No current facility-administered medications for this visit.   Facility-Administered Medications Ordered in Other Visits  Medication Dose Route Frequency Provider Last Rate Last Admin  .  heparin lock flush 100 unit/mL  500 Units Intravenous Once Lloyd Huger, MD      . sodium  chloride flush (NS) 0.9 % injection 10 mL  10 mL Intravenous PRN Lloyd Huger, MD      . sodium chloride flush (NS) 0.9 % injection 10 mL  10 mL Intravenous PRN Lloyd Huger, MD   10 mL at 01/22/18 1419    OBJECTIVE: Vitals:   12/03/20 1101  BP: 113/80  Pulse: (!) 112  Resp: 20  Temp: 98.2 F (36.8 C)     Body mass index is 20.18 kg/m.    ECOG FS:1 - Symptomatic but completely ambulatory  General: Well-developed, well-nourished, no acute distress. Eyes: Pink conjunctiva, anicteric sclera. HEENT: Normocephalic, moist mucous membranes. Lungs: No audible wheezing or coughing. Heart: Regular rate and rhythm. Abdomen: Soft, nontender, no obvious distention. Musculoskeletal: No edema, cyanosis, or clubbing. Neuro: Alert, answering all questions appropriately. Cranial nerves grossly intact. Skin: No rashes or petechiae noted. Psych: Normal affect.  LAB RESULTS:  Lab Results  Component Value Date   NA 135 12/03/2020   K 3.8 12/03/2020   CL 100 12/03/2020   CO2 22 12/03/2020   GLUCOSE 104 (H) 12/03/2020   BUN 14 12/03/2020   CREATININE 0.84 12/03/2020   CALCIUM 8.9 12/03/2020   PROT 7.3 12/03/2020   ALBUMIN 3.3 (L) 12/03/2020   AST 40 12/03/2020   ALT 31 12/03/2020   ALKPHOS 141 (H) 12/03/2020   BILITOT 0.4 12/03/2020   GFRNONAA >60 12/03/2020   GFRAA >60 03/23/2020    Lab Results  Component Value Date   WBC 2.1 (L) 12/03/2020   NEUTROABS 1.2 (L) 12/03/2020   HGB 8.7 (L) 12/03/2020   HCT 27.1 (L) 12/03/2020   MCV 85.2 12/03/2020   PLT 189 12/03/2020     STUDIES: DG Fluoro Guide CV Line Right  Result Date: 11/12/2020 INDICATION: 70 year old female with history of non-small cell lung Page and right internal jugular vein indwelling Port-A-Cath placed by Dr. Magdalen Spatz on 02/09/2017. The port has functioned perfectly until recently with inability to aspirate. EXAM: FLUOROSCOPIC GUIDED PORT A CATHETER CHECK MEDICATIONS: None. CONTRAST:  None FLUOROSCOPY TIME:   0.3 minutes (22.6 mGy) COMPLICATIONS: None immediate. TECHNIQUE: The procedure, risks, benefits, and alternatives were explained to the patient and informed written consent was obtained. A timeout was performed prior to the initiation of the procedure. The patient's chest port a catheter was accessed by the infusion center prior to arrival. The patient was placed supine on the fluoroscopy table. A preprocedural spot fluoroscopic image was obtained of the chest in existing port a catheter. Note was made of difficulty aspirating blood from the port a catheter. Contrast was injected via the Port a catheter and images were reviewed. The Port a catheter was flushed with a heparin dwell and de accessed. A dressing was placed. The patient tolerated the procedure well without immediate postprocedural complication. FINDINGS: Unchanged positioning of anterior chest wall right internal jugular vein approach port a catheter with tip projected over the expected location of the central SVC. There was difficulty aspirating blood from the port a catheter. Contrast injection demonstrated the presence of a fibrin sheath about the distal end of the Port a catheter which was located within the central aspect of the SVC. There is no evidence of catheter kink or fracture. No contrast extravasation. IMPRESSION: Difficulty aspirating of the Port a catheter is secondary to the presence of a fibrin sheath. 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 Above findings were discussed and the following options were provided to the patient: 1. Maintain the Port a catheter as is, realizing it may be difficulty for blood draws, but could be used for medication administration. 2. Attempt to perform a fibrin sheath stripping from the port a catheter tip via a right common femoral vein approach. 3. Replace the subclavian vein approach port a catheter with an IJ approach catheter with tip terminating within the superior aspect of the right  atrium. After discussion of all potential treatment option, the patient has elected to pursue port stripping. This was discussed with Dr. Grayland Ormond who is in agreement with this plan of care. As such, the procedure will be scheduled for 11/14/19. Lauren Cancer, MD [facility] Radiology Electronically Signed   By: Lauren Cancer MD   On: 11/12/2020 14:48   IR Port Repair Central Venous Access Device  Result Date: 11/13/2020 INDICATION: 70 year old female with history of non-small cell lung Page and indwelling right internal jugular vein Port-A-Cath placed by Dr. Genevive Bi on 02/09/2017 with inability to aspirate from the port due to fibrin sheath identified on port check yesterday. EXAM: 1. Ultrasound-guided access of the right common femoral vein. 2. Intravascular ultrasound evaluation of the central veins. 3. Port-A-Cath stripping. COMPARISON:  11/12/2020 MEDICATIONS: None. ANESTHESIA/SEDATION: Moderate (conscious) sedation was employed during this procedure. A total of Versed 2 mg and Fentanyl 100 mcg was administered intravenously. Moderate Sedation Time: 43 minutes. The patient's level of consciousness and vital signs were monitored continuously by radiology nursing throughout the procedure under my direct supervision. CONTRAST:  None FLUOROSCOPY TIME:  14.5 minutes minutes, (16.9 mGy) COMPLICATIONS: None immediate. PROCEDURE: The procedure, risks, benefits, and alternatives were explained to the patient. Questions regarding the procedure were encouraged and answered. The patient understands and consents to the procedure. The right neck and right groin were prepped with chlorhexidine in a sterile fashion, and a sterile drape was applied covering the operative field. Maximum barrier sterile technique with sterile gowns and gloves were used for the procedure. A timeout was performed prior to the initiation of the procedure. The indwelling Port-A-Cath was then accessed  without difficulty. The port once again was able to be flushed but not aspirated. Next, ultrasound was used to study the right common femoral vein. The right common femoral vein was widely patent and free of internal echoes. Vena puncture site was planned. Subdermal Local anesthesia was administered 1% lidocaine at the planned needle entry site. A small skin nick was made. Under direct ultrasound visualization, the right common femoral vein was accessed with ultrasound guidance. A permanent image was stored and placed in the imaging record. A micropuncture set was then use to dilate up to a 0.035" system and a Rosen wire was directed to the inferior vena cava under fluoroscopic guidance. Serial dilation was performed and an 8 [place], 65 cm sheath was then placed over the Rosen wire with the tip in the perihepatic inferior vena cava. A Glidewire and angled tip catheter were then used to catheterize the superior vena cava. The Glidewire was exchanged for the [facility] wire and the catheter was removed. An intravascular ultrasound was then advanced over the wire to evaluate the central veins. The central inferior vena cava is patent. The right atrium is widely patent. The superior vena cava is patent but small caliber and ovoid, likely secondary to extrinsic compression from pulmonary mass. There is an echogenic thrombus affixed to the inferior aspect of the indwelling  Port-A-Cath catheter tip which is positioned in the superior aspect of the superior vena cava. Peripheral to the port catheter tip, the superior vena cava and right brachiocephalic vein are widely patent and normal in caliber. The intravascular ultrasound was then removed and a 6 French gooseneck snare was directed to the superior vena cava. After multiple passes, the gooseneck snare was unable to capture the Port-A-Cath tip. Therefore, a 6 [place], tri lobed ensnare device was then used which was able to capture the catheter tip. Three stripping passes were  then performed which in turn reposition the catheter tip approximately 6 cm inferior to its previous position. The Port-A-Cath in aspirated and flushed without difficulty. Intravascular ultrasound was then placed over the wire for post stripping evaluation. There is no evidence of residual fibrin sheath. The catheter tip was floating freely within the mid to central superior vena cava. No traditional venogram was performed due to adequate evaluation with intravascular ultrasound and current global iodinated contrast shortage. The catheters and wires were then removed. Manual compression was applied at the groin site for 5 minutes to achieve hemostasis. The catheter was then flushed/blocked with heparinized saline and the indwelling Huber needle was removed. The patient tolerated procedure well without immediate complication. FINDINGS: Focal fibrin sheath about the catheter tip. Catheter tip in the superior aspect of the superior vena cava. IMPRESSION: Successful fluoroscopic and intravascular ultrasound guided port catheter tip stripping with removal of fibrin sheath. The catheter tip was repositioned approximately 6 cm inferior from prior indwelling position, now within the central aspect of the SVC. PLAN: Proceed with Port-A-Cath use per usual. 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  She initially received 4 cycles of adjuvant cisplatin and Alimta completing on January 23, 2014.  Patient was on the [facility] protocol for maintenance Tarceva versus placebo, but discontinued treatment in November 2015 because of significant side effects.  Although blinded, presumption was she was receiving Tarceva.  She recently was noted to have a recurrence confirmed by right paratracheal lymph node biopsy  on January 24, 2017.  PET scan on February 03, 2017 did not reveal any distant metastasis.  She underwent treatment with weekly carboplatinum and Taxol along with daily XRT finishing in October 2018.  She initiated maintenance durvalumab on June 29, 2017.  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  Patient received Tagrisso from April 2019 through September 2021. PET scan results from July 09, 2019 revealed mild progression of disease in her lungs with an isolated left kidney metastasis that has undergone ablation. PET scan on March 11, 2020 revealed continued progression of disease in her lungs.    She subsequently discontinued Tagrisso and initiated single agent pemetrexed on March 23, 2020.    Patient enrolled in clinical trial at [facility] between December 2021 and April 2022.  She initiated single agent gemcitabine on October 15, 2020.   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  PLAN:   1. 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  Patient's most recent imaging with CT scan at [facility] on October 05, 2020 revealed new and increasing size of bilateral pulmonary nodules, bilateral axillary and left supraclavicular lymphadenopathy, and interval enlargement  of multiple lesions throughout left and right lobes of liver.  MRI of the brain also on October 05, 2020 revealed stable metastatic lesions.  Because of progression of disease, patient discontinued clinical trial and initiated single agent gemcitabine on days 1 and 8 with day 15 off.  Proceed with cycle 3, day 8 of treatment today.  Return to clinic in 2 weeks for further evaluation and consideration of cycle 4, day 1.  Will reimage with CT as well as MRI of the brain prior to next treatment.   2.  Diarrhea: Resolved.  Continue Lomotil as needed. 3.  Cough: Patient does not complain of this today.   Monitor.   4.  Breast lesion: Benign papilloma.  Patient underwent lumpectomy on March 01, 2019.  Her most recent mammogram on March 03, 2020 was reported as BI-RADS 2.   5.  Anemia: Hemoglobin has trended down slightly to 8.7, monitor. 6.  Renal insufficiency: Resolved. 7.  Leukopenia: Patient's ANC is 1.2 today.  Proceed with treatment as above.  Patient may require Neulasta or bio similar in the near future. 8.  Nausea: Patient does not complain of this today.  Continue Compazine as needed.  Patient expressed understanding and was in agreement with this plan. She also understands that She can call clinic at any time with any questions, concerns, or complaints.    Lloyd Huger, MD   12/04/2020 5:45 AM

## 2020-11-26 NOTE — Patient Instructions (Signed)
[facility]  Discharge Instructions: Thank you for choosing [facility] to provide your oncology and hematology care.  If you have a lab appointment with the [facility], please go directly to the [facility] and check in at the registration area.  Wear comfortable clothing and clothing appropriate for easy access to any Portacath or PICC line.   We strive to give you quality time with your provider. You may need to reschedule your appointment if you arrive late (15 or more minutes).  Arriving late affects you and other patients whose appointments are after yours.  Also, if you miss three or more appointments without notifying the office, you may be dismissed from the clinic at the provider's discretion.      For prescription refill requests, have your pharmacy contact our office and allow 72 hours for refills to be completed.    Today you received the following chemotherapy : Gemzar   To help prevent nausea and vomiting after your treatment, we encourage you to take your nausea medication as directed.  BELOW ARE SYMPTOMS THAT SHOULD BE REPORTED IMMEDIATELY: . *FEVER GREATER THAN 100.4 F (38 C) OR HIGHER . *CHILLS OR SWEATING . *NAUSEA AND VOMITING THAT IS NOT CONTROLLED WITH YOUR NAUSEA MEDICATION . *UNUSUAL SHORTNESS OF BREATH . *UNUSUAL BRUISING OR BLEEDING . *URINARY PROBLEMS (pain or burning when urinating, or frequent urination) . *BOWEL PROBLEMS (unusual diarrhea, constipation, pain near the anus) . TENDERNESS IN MOUTH AND THROAT WITH OR WITHOUT PRESENCE OF ULCERS (sore throat, sores in mouth, or a toothache) . UNUSUAL RASH, SWELLING OR PAIN  . UNUSUAL VAGINAL DISCHARGE OR ITCHING   Items with * indicate a potential emergency and should be followed up as soon as possible or go to the Emergency Department if any problems should occur.  Please show the CHEMOTHERAPY ALERT CARD or IMMUNOTHERAPY ALERT CARD at check-in to the  Emergency Department and triage nurse.  Should you have questions after your visit or need to cancel or reschedule your appointment, please contact [facility]  [phone] and follow the prompts.  Office hours are 8:00 a.m. to 4:30 p.m. Monday - Friday. Please note that voicemails left after 4:00 p.m. may not be returned until the following business day.  We are closed weekends and major holidays. You have access to a nurse at all times for urgent questions. Please call the main number to the clinic [phone] and follow the prompts.  For any non-urgent questions, you may also contact your provider using MyChart. We now offer e-Visits for anyone 84 and older to request care online for non-urgent symptoms. For details visit [email or web address].   Also download the MyChart app! Go to the app store, search "MyChart", open the app, select [facility], and log in with your MyChart username and password.  Due to Covid, a mask is required upon entering the hospital/clinic. If you do not have a mask, one will be given to you upon arrival. For doctor visits, patients may have 1 support person aged 69 or older with them. For treatment visits, patients cannot have anyone with them due to current Covid guidelines and our immunocompromised population.

## 2020-12-02 ENCOUNTER — Ambulatory Visit: Payer: Medicare Other | Admitting: Radiation Oncology

## 2020-12-03 ENCOUNTER — Inpatient Hospital Stay: Payer: Medicare Other

## 2020-12-03 ENCOUNTER — Inpatient Hospital Stay (HOSPITAL_BASED_OUTPATIENT_CLINIC_OR_DEPARTMENT_OTHER): Payer: Medicare Other | Admitting: Oncology

## 2020-12-03 ENCOUNTER — Encounter: Payer: Self-pay | Admitting: Oncology

## 2020-12-03 ENCOUNTER — Inpatient Hospital Stay: Payer: Medicare Other | Attending: Oncology

## 2020-12-03 VITALS — HR 104

## 2020-12-03 VITALS — BP 113/80 | HR 112 | Temp 98.2°F | Resp 20 | Wt 113.9 lb

## 2020-12-03 DIAGNOSIS — C3490 Malignant neoplasm of unspecified part of unspecified bronchus or lung: Secondary | ICD-10-CM

## 2020-12-03 DIAGNOSIS — R110 Nausea: Secondary | ICD-10-CM | POA: Insufficient documentation

## 2020-12-03 DIAGNOSIS — Z5111 Encounter for antineoplastic chemotherapy: Secondary | ICD-10-CM | POA: Diagnosis not present

## 2020-12-03 DIAGNOSIS — C3491 Malignant neoplasm of unspecified part of right bronchus or lung: Secondary | ICD-10-CM | POA: Diagnosis present

## 2020-12-03 DIAGNOSIS — D649 Anemia, unspecified: Secondary | ICD-10-CM | POA: Diagnosis not present

## 2020-12-03 DIAGNOSIS — C7931 Secondary malignant neoplasm of brain: Secondary | ICD-10-CM | POA: Diagnosis not present

## 2020-12-03 LAB — CBC WITH DIFFERENTIAL/PLATELET
Abs Immature Granulocytes: 0.01 10*3/uL (ref 0.00–0.07)
Basophils Absolute: 0 10*3/uL (ref 0.0–0.1)
Basophils Relative: 1 %
Eosinophils Absolute: 0.1 10*3/uL (ref 0.0–0.5)
Eosinophils Relative: 3 %
HCT: 27.1 % — ABNORMAL LOW (ref 36.0–46.0)
Hemoglobin: 8.7 g/dL — ABNORMAL LOW (ref 12.0–15.0)
Immature Granulocytes: 1 %
Lymphocytes Relative: 25 %
Lymphs Abs: 0.5 10*3/uL — ABNORMAL LOW (ref 0.7–4.0)
MCH: 27.4 pg (ref 26.0–34.0)
MCHC: 32.1 g/dL (ref 30.0–36.0)
MCV: 85.2 fL (ref 80.0–100.0)
Monocytes Absolute: 0.3 10*3/uL (ref 0.1–1.0)
Monocytes Relative: 16 %
Neutro Abs: 1.2 10*3/uL — ABNORMAL LOW (ref 1.7–7.7)
Neutrophils Relative %: 54 %
Platelets: 189 10*3/uL (ref 150–400)
RBC: 3.18 MIL/uL — ABNORMAL LOW (ref 3.87–5.11)
RDW: 21.9 % — ABNORMAL HIGH (ref 11.5–15.5)
WBC: 2.1 10*3/uL — ABNORMAL LOW (ref 4.0–10.5)
nRBC: 1 % — ABNORMAL HIGH (ref 0.0–0.2)

## 2020-12-03 LAB — COMPREHENSIVE METABOLIC PANEL
ALT: 31 U/L (ref 0–44)
AST: 40 U/L (ref 15–41)
Albumin: 3.3 g/dL — ABNORMAL LOW (ref 3.5–5.0)
Alkaline Phosphatase: 141 U/L — ABNORMAL HIGH (ref 38–126)
Anion gap: 13 (ref 5–15)
BUN: 14 mg/dL (ref 8–23)
CO2: 22 mmol/L (ref 22–32)
Calcium: 8.9 mg/dL (ref 8.9–10.3)
Chloride: 100 mmol/L (ref 98–111)
Creatinine, Ser: 0.84 mg/dL (ref 0.44–1.00)
GFR, Estimated: >60 mL/min (ref >60)
Glucose, Bld: 104 mg/dL — ABNORMAL HIGH (ref 70–99)
Potassium: 3.8 mmol/L (ref 3.5–5.1)
Sodium: 135 mmol/L (ref 135–145)
Total Bilirubin: 0.4 mg/dL (ref 0.3–1.2)
Total Protein: 7.3 g/dL (ref 6.5–8.1)

## 2020-12-03 MED ORDER — SODIUM CHLORIDE 0.9 % IV SOLN
Freq: Once | INTRAVENOUS | Status: AC
Start: 2020-12-03 — End: 2020-12-03
  Filled 2020-12-03: qty 250

## 2020-12-03 MED ORDER — PROCHLORPERAZINE MALEATE 10 MG PO TABS
10.0000 mg | ORAL_TABLET | Freq: Once | ORAL | Status: AC
Start: 2020-12-03 — End: 2020-12-03
  Administered 2020-12-03: 10 mg via ORAL
  Filled 2020-12-03: qty 1

## 2020-12-03 MED ORDER — HEPARIN SOD (PORK) LOCK FLUSH 100 UNIT/ML IV SOLN
500.0000 [IU] | Freq: Once | INTRAVENOUS | Status: AC | PRN
  Administered 2020-12-03: 500 [IU]
  Filled 2020-12-03: qty 5

## 2020-12-03 MED ORDER — SODIUM CHLORIDE 0.9 % IV SOLN
1600.0000 mg | Freq: Once | INTRAVENOUS | Status: AC
  Administered 2020-12-03: 1600 mg via INTRAVENOUS
  Filled 2020-12-03: qty 26.3

## 2020-12-03 MED ORDER — HEPARIN SOD (PORK) LOCK FLUSH 100 UNIT/ML IV SOLN
INTRAVENOUS | Status: AC
  Filled 2020-12-03: qty 5

## 2020-12-03 MED ORDER — SODIUM CHLORIDE 0.9% FLUSH
10.0000 mL | Freq: Once | INTRAVENOUS | Status: AC
Start: 2020-12-03 — End: 2020-12-03
  Administered 2020-12-03: 10 mL via INTRAVENOUS
  Filled 2020-12-03: qty 10

## 2020-12-03 NOTE — Progress Notes (Signed)
Nutrition Assessment   Reason for Assessment:  RD screen, positive for weight loss  ASSESSMENT:  70 year old female with stage IV lung cancer, recurrent.  S/p lobectomy in 2015, COPD, anemia, GERD.  Patient receiving gemcitabine.    Met with patient during infusion.  Patient reports that she has lost 24 lb in the last year. Husband has had esophageal cancer with multiple surgeries and hospitalizations.  Reports that appetite is generally decrease for about 3 days post treatment.  Has changed diet from grilled chicken and salad to meats with gravy and creamed corn (higher fat items).  Reports some issues with nausea but medications work.  Reports that portion sizes are smaller.  Has trouble coming up with items to cook that she and husband want.  Has had meals brought in to her and her husband.      Medications: reviewed   Labs: reviewed   Anthropometrics:   Height: 63 inches Weight: 113 lb 14.4 oz today 24 lb weight loss in the last year BMI: 20 18% weight loss in the last year  Estimated Energy Needs  Kcals: 1500-1780 Protein: 75-89 g Fluid: 1.5 L   NUTRITION DIAGNOSIS: Inadequate oral intake related to cancer related treatment side effects (decrease appetite) and stress as evidenced by 18% weight loss in the last year.    INTERVENTION:  Reviewed ways to add calories and protein.  Patient has already made changes in diet to increase calories and protein. High Calorie, High Protein handout provided Discussed high calorie oral nutrition supplements.   Contact information provided   MONITORING, EVALUATION, GOAL: weight trends, intake   Next Visit:  as needed Patient will reach out to RD if needed  Lauren Page, [place], [place] Registered Dietitian [phone] (mobile)

## 2020-12-03 NOTE — Patient Instructions (Signed)
[facility]    Discharge Instructions: Thank you for choosing [facility] to provide your oncology and hematology care.  If you have a lab appointment with the [facility], please go directly to the [facility] and check in at the registration area.  Wear comfortable clothing and clothing appropriate for easy access to any Portacath or PICC line.   We strive to give you quality time with your provider. You may need to reschedule your appointment if you arrive late (15 or more minutes).  Arriving late affects you and other patients whose appointments are after yours.  Also, if you miss three or more appointments without notifying the office, you may be dismissed from the clinic at the provider's discretion.      For prescription refill requests, have your pharmacy contact our office and allow 72 hours for refills to be completed.    Today you received the following chemotherapy and/or immunotherapy agents - Gemzar   To help prevent nausea and vomiting after your treatment, we encourage you to take your nausea medication as directed.  BELOW ARE SYMPTOMS THAT SHOULD BE REPORTED IMMEDIATELY: . *FEVER GREATER THAN 100.4 F (38 C) OR HIGHER . *CHILLS OR SWEATING . *NAUSEA AND VOMITING THAT IS NOT CONTROLLED WITH YOUR NAUSEA MEDICATION . *UNUSUAL SHORTNESS OF BREATH . *UNUSUAL BRUISING OR BLEEDING . *URINARY PROBLEMS (pain or burning when urinating, or frequent urination) . *BOWEL PROBLEMS (unusual diarrhea, constipation, pain near the anus) . TENDERNESS IN MOUTH AND THROAT WITH OR WITHOUT PRESENCE OF ULCERS (sore throat, sores in mouth, or a toothache) . UNUSUAL RASH, SWELLING OR PAIN  . UNUSUAL VAGINAL DISCHARGE OR ITCHING   Items with * indicate a potential emergency and should be followed up as soon as possible or go to the Emergency Department if any problems should occur.  Please show the CHEMOTHERAPY ALERT CARD or IMMUNOTHERAPY  ALERT CARD at check-in to the Emergency Department and triage nurse.  Should you have questions after your visit or need to cancel or reschedule your appointment, please contact [facility]  [phone] and follow the prompts.  Office hours are 8:00 a.m. to 4:30 p.m. Monday - Friday. Please note that voicemails left after 4:00 p.m. may not be returned until the following business day.  We are closed weekends and major holidays. You have access to a nurse at all times for urgent questions. Please call the main number to the clinic [phone] and follow the prompts.  For any non-urgent questions, you may also contact your provider using MyChart. We now offer e-Visits for anyone 49 and older to request care online for non-urgent symptoms. For details visit [email or web address].   Also download the MyChart app! Go to the app store, search "MyChart", open the app, select [facility], and log in with your MyChart username and password.  Due to Covid, a mask is required upon entering the hospital/clinic. If you do not have a mask, one will be given to you upon arrival. For doctor visits, patients may have 1 support person aged 7 or older with them. For treatment visits, patients cannot have anyone with them due to current Covid guidelines and our immunocompromised population.   Prochlorperazine tablets What is this medicine? PROCHLORPERAZINE (proe klor PER a zeen) helps to control severe nausea and vomiting. This medicine is also used to treat schizophrenia. It can also help patients who experience anxiety that is not due to psychological illness. This medicine may  be used for other purposes; ask your health care provider or pharmacist if you have questions. COMMON BRAND NAME(S): Compazine What should I tell my health care provider before I take this medicine? They need to know if you have any of these conditions:  blockage in your bowel  brain  tumor  dementia  diabetes  difficulty swallowing  glaucoma  have trouble controlling your muscles  head injury  heart disease  history of irregular heartbeat  if you often drink alcohol  liver disease  low blood counts, like low Jameela Michna cell, platelet, or red cell counts  low blood pressure  lung or breathing disease, like asthma  Parkinson's disease  prostate disease  seizures  trouble passing urine  an unusual or allergic reaction to prochlorperazine, other medicines, foods, dyes, or preservatives  pregnant or trying to get pregnant  breast-feeding How should I use this medicine? Take this medicine by mouth with a glass of water. Follow the directions on the prescription label. Take your doses at regular intervals. Do not take your medicine more often than directed. Do not stop taking this medicine suddenly. This can cause nausea, vomiting, and dizziness. Ask your doctor or health care professional for advice. Talk to your pediatrician regarding the use of this medicine in children. Special care may be needed. While this drug may be prescribed for children as young as 2 years for selected conditions, precautions do apply. Overdosage: If you think you have taken too much of this medicine contact a poison control center or emergency room at once. NOTE: This medicine is only for you. Do not share this medicine with others. What if I miss a dose? If you miss a dose, take it as soon as you can. If it is almost time for your next dose, take only that dose. Do not take double or extra doses. What may interact with this medicine? Do not take this medicine with any of the following medications:  cisapride  dofetilide  dronedarone  metoclopramide  pimozide  saquinavir  thioridazine This medicine may also interact with the following medications:  alcohol  antihistamines for allergy, cough, and cold  atropine  certain medicines for anxiety or  sleep  certain medicines for bladder problems like oxybutynin, tolterodine  certain medicines for depression like amitriptyline, fluoxetine, sertraline  certain medicines for stomach problems like dicyclomine, hyoscyamine  certain medicines for travel sickness like scopolamine  epinephrine  general anesthetics like halothane, isoflurane, methoxyflurane, propofol  ipratropium  levodopa or other medicines for Parkinson's disease  lithium  medicines for blood pressure  medicines for seizures like phenobarbital, primidone, phenytoin  medicines that relax muscles for surgery  narcotic medicines for pain  propranolol  warfarin This list may not describe all possible interactions. Give your health care provider a list of all the medicines, herbs, non-prescription drugs, or dietary supplements you use. Also tell them if you smoke, drink alcohol, or use illegal drugs. Some items may interact with your medicine. What should I watch for while using this medicine? Visit your health care professional for regular checks on your progress. Tell your health care professional if symptoms do not start to get better or if they get worse. You may get drowsy or dizzy. Do not drive, use machinery, or do anything that needs mental alertness until you know how this medicine affects you. Do not stand or sit up quickly, especially if you are an older patient. This reduces the risk of dizzy or fainting spells. Alcohol may interfere with  the effect of this medicine. Avoid alcoholic drinks. This drug can cause problems with controlling your body temperature. It can lower the response of your body to cold temperatures. If possible, stay indoors during cold weather. If you must go outdoors, wear warm clothes. It can also lower the response of your body to heat. Do not overheat. Do not over-exercise. Stay out of the sun when possible. If you must be in the sun, wear cool clothing. Drink plenty of water. If you  have trouble controlling your body temperature, call your health care provider right away. This medicine may increase blood sugar. Ask your health care provider if changes in diet or medicines are needed if you have diabetes. This medicine can make you more sensitive to the sun. Keep out of the sun. If you cannot avoid being in the sun, wear protective clothing and use sunscreen. Do not use sun lamps or tanning beds/booths. Your mouth may get dry. Chewing sugarless gum or sucking hard candy, and drinking plenty of water may help. Contact your doctor if the problem does not go away or is severe. What side effects may I notice from receiving this medicine? Side effects that you should report to your doctor or health care professional as soon as possible:  allergic reactions like skin rash, itching or hives, swelling of the face, lips, or tongue  abnormal production of milk  breast enlargement in both males and females  changes in vision  chest pain  confusion  fast, irregular heartbeat  fever, chills, sore throat  seizures  signs and symptoms of high blood sugar such as being more thirsty or hungry or having to urinate more than normal. You may also feel very tired or have blurry vision.  signs and symptoms of liver injury like dark yellow or brown urine; general ill feeling or flu-like symptoms; light-colored stools; loss of appetite; nausea; right upper belly pain; unusually weak or tired; yellowing of the eyes or skin  signs and symptoms of low blood pressure like dizziness; feeling faint or lightheaded, falls; unusually weak or tired  trouble passing urine or change in the amount of urine  trouble swallowing  uncontrollable movements of the arms, face, head, mouth, neck, or upper body  unusual bruising or bleeding  unusually weak or tired Side effects that usually do not require medical attention (report to your doctor or health care professional if they continue or are  bothersome):  constipation  drowsiness  dry mouth This list may not describe all possible side effects. Call your doctor for medical advice about side effects. You may report side effects to FDA at 1-800-FDA-1088. Where should I keep my medicine? Keep out of the reach of children. Store at room temperature between 15 and 30 degrees C (59 and 86 degrees F). Protect from light. Throw away any unused medicine after the expiration date. NOTE: This sheet is a summary. It may not cover all possible information. If you have questions about this medicine, talk to your doctor, pharmacist, or health care provider.  2021 [employer] Standard (2019-05-14 12:26:11)

## 2020-12-03 NOTE — Progress Notes (Signed)
Patient here for follow up no complaints today.

## 2020-12-03 NOTE — Progress Notes (Signed)
HR out of parameters - ok to proceed per MD

## 2020-12-04 ENCOUNTER — Encounter: Payer: Self-pay | Admitting: Oncology

## 2020-12-09 ENCOUNTER — Telehealth: Payer: Self-pay

## 2020-12-10 ENCOUNTER — Encounter: Payer: Self-pay | Admitting: Oncology

## 2020-12-11 ENCOUNTER — Ambulatory Visit
Admission: RE | Admit: 2020-12-11 | Discharge: 2020-12-11 | Disposition: A | Discharge status: Home / Self Care | Payer: Self-pay | Source: Ambulatory Visit | Attending: Oncology | Admitting: Oncology

## 2020-12-11 ENCOUNTER — Other Ambulatory Visit: Payer: Self-pay

## 2020-12-11 DIAGNOSIS — C3490 Malignant neoplasm of unspecified part of unspecified bronchus or lung: Secondary | ICD-10-CM

## 2020-12-11 MED ORDER — METOPROLOL SUCCINATE ER 25 MG PO TB24
ORAL_TABLET | ORAL | 6 refills | Status: AC
  Filled 2020-12-11: qty 15, 30d supply, fill #0

## 2020-12-16 ENCOUNTER — Ambulatory Visit
Admission: RE | Admit: 2020-12-16 | Discharge: 2020-12-16 | Disposition: A | Discharge status: Home / Self Care | Payer: Medicare Other | Source: Ambulatory Visit | Attending: Oncology | Admitting: Oncology

## 2020-12-16 ENCOUNTER — Inpatient Hospital Stay: Payer: Medicare Other

## 2020-12-16 ENCOUNTER — Other Ambulatory Visit: Payer: Self-pay

## 2020-12-16 DIAGNOSIS — C3490 Malignant neoplasm of unspecified part of unspecified bronchus or lung: Secondary | ICD-10-CM | POA: Diagnosis present

## 2020-12-16 DIAGNOSIS — C7931 Secondary malignant neoplasm of brain: Secondary | ICD-10-CM

## 2020-12-16 DIAGNOSIS — Z95828 Presence of other vascular implants and grafts: Secondary | ICD-10-CM

## 2020-12-16 MED ORDER — IOHEXOL 300 MG/ML  SOLN
80.0000 mL | Freq: Once | INTRAMUSCULAR | Status: AC | PRN
  Administered 2020-12-16: 80 mL via INTRAVENOUS

## 2020-12-16 MED ORDER — GADOBUTROL 1 MMOL/ML IV SOLN
5.0000 mL | Freq: Once | INTRAVENOUS | Status: AC | PRN
  Administered 2020-12-16: 5 mL via INTRAVENOUS

## 2020-12-16 MED ORDER — HEPARIN SOD (PORK) LOCK FLUSH 100 UNIT/ML IV SOLN
500.0000 [IU] | Freq: Once | INTRAVENOUS | Status: AC
  Administered 2020-12-16: 500 [IU]
  Filled 2020-12-16: qty 5

## 2020-12-16 MED ORDER — HEPARIN SOD (PORK) LOCK FLUSH 100 UNIT/ML IV SOLN
INTRAVENOUS | Status: AC
  Filled 2020-12-16: qty 5

## 2020-12-16 MED ORDER — SODIUM CHLORIDE 0.9% FLUSH
10.0000 mL | Freq: Once | INTRAVENOUS | Status: AC
  Administered 2020-12-16: 10 mL
  Filled 2020-12-16: qty 10

## 2020-12-17 ENCOUNTER — Inpatient Hospital Stay (HOSPITAL_BASED_OUTPATIENT_CLINIC_OR_DEPARTMENT_OTHER): Payer: Medicare Other | Admitting: Oncology

## 2020-12-17 ENCOUNTER — Inpatient Hospital Stay: Payer: Medicare Other

## 2020-12-17 ENCOUNTER — Encounter: Payer: Self-pay | Admitting: Oncology

## 2020-12-17 VITALS — BP 119/80 | HR 103 | Resp 20 | Wt 113.7 lb

## 2020-12-17 DIAGNOSIS — R519 Headache, unspecified: Secondary | ICD-10-CM | POA: Diagnosis not present

## 2020-12-17 DIAGNOSIS — C3490 Malignant neoplasm of unspecified part of unspecified bronchus or lung: Secondary | ICD-10-CM

## 2020-12-17 DIAGNOSIS — R110 Nausea: Secondary | ICD-10-CM

## 2020-12-17 DIAGNOSIS — Z5111 Encounter for antineoplastic chemotherapy: Secondary | ICD-10-CM | POA: Diagnosis not present

## 2020-12-17 LAB — COMPREHENSIVE METABOLIC PANEL
ALT: 17 U/L (ref 0–44)
AST: 27 U/L (ref 15–41)
Albumin: 3.4 g/dL — ABNORMAL LOW (ref 3.5–5.0)
Alkaline Phosphatase: 136 U/L — ABNORMAL HIGH (ref 38–126)
Anion gap: 9 (ref 5–15)
BUN: 17 mg/dL (ref 8–23)
CO2: 25 mmol/L (ref 22–32)
Calcium: 8.8 mg/dL — ABNORMAL LOW (ref 8.9–10.3)
Chloride: 99 mmol/L (ref 98–111)
Creatinine, Ser: 1.11 mg/dL — ABNORMAL HIGH (ref 0.44–1.00)
GFR, Estimated: 53 mL/min — ABNORMAL LOW (ref >60)
Glucose, Bld: 105 mg/dL — ABNORMAL HIGH (ref 70–99)
Potassium: 4.1 mmol/L (ref 3.5–5.1)
Sodium: 133 mmol/L — ABNORMAL LOW (ref 135–145)
Total Bilirubin: 0.2 mg/dL — ABNORMAL LOW (ref 0.3–1.2)
Total Protein: 7.3 g/dL (ref 6.5–8.1)

## 2020-12-17 LAB — CBC WITH DIFFERENTIAL/PLATELET
Abs Immature Granulocytes: 0.02 10*3/uL (ref 0.00–0.07)
Basophils Absolute: 0 10*3/uL (ref 0.0–0.1)
Basophils Relative: 1 %
Eosinophils Absolute: 0.3 10*3/uL (ref 0.0–0.5)
Eosinophils Relative: 5 %
HCT: 29.9 % — ABNORMAL LOW (ref 36.0–46.0)
Hemoglobin: 9.7 g/dL — ABNORMAL LOW (ref 12.0–15.0)
Immature Granulocytes: 0 %
Lymphocytes Relative: 11 %
Lymphs Abs: 0.6 10*3/uL — ABNORMAL LOW (ref 0.7–4.0)
MCH: 28.6 pg (ref 26.0–34.0)
MCHC: 32.4 g/dL (ref 30.0–36.0)
MCV: 88.2 fL (ref 80.0–100.0)
Monocytes Absolute: 0.6 10*3/uL (ref 0.1–1.0)
Monocytes Relative: 10 %
Neutro Abs: 4.3 10*3/uL (ref 1.7–7.7)
Neutrophils Relative %: 73 %
Platelets: 351 10*3/uL (ref 150–400)
RBC: 3.39 MIL/uL — ABNORMAL LOW (ref 3.87–5.11)
RDW: 22.3 % — ABNORMAL HIGH (ref 11.5–15.5)
WBC: 5.9 10*3/uL (ref 4.0–10.5)
nRBC: 0 % (ref 0.0–0.2)

## 2020-12-17 LAB — MAGNESIUM: Magnesium: 2 mg/dL (ref 1.7–2.4)

## 2020-12-17 MED ORDER — SODIUM CHLORIDE 0.9% FLUSH
10.0000 mL | Freq: Once | INTRAVENOUS | Status: AC
  Administered 2020-12-17: 10 mL via INTRAVENOUS
  Filled 2020-12-17: qty 10

## 2020-12-17 MED ORDER — HEPARIN SOD (PORK) LOCK FLUSH 100 UNIT/ML IV SOLN
500.0000 [IU] | Freq: Once | INTRAVENOUS | Status: AC | PRN
  Administered 2020-12-17: 500 [IU]
  Filled 2020-12-17: qty 5

## 2020-12-17 MED ORDER — SODIUM CHLORIDE 0.9 % IV SOLN
Freq: Once | INTRAVENOUS | Status: AC
Start: 2020-12-17 — End: 2020-12-17
  Filled 2020-12-17: qty 250

## 2020-12-17 MED ORDER — PROCHLORPERAZINE MALEATE 10 MG PO TABS
10.0000 mg | ORAL_TABLET | Freq: Once | ORAL | Status: AC
  Administered 2020-12-17: 10 mg via ORAL
  Filled 2020-12-17: qty 1

## 2020-12-17 MED ORDER — SODIUM CHLORIDE 0.9 % IV SOLN
1600.0000 mg | Freq: Once | INTRAVENOUS | Status: AC
  Administered 2020-12-17: 1600 mg via INTRAVENOUS
  Filled 2020-12-17: qty 26.3

## 2020-12-17 MED ORDER — HEPARIN SOD (PORK) LOCK FLUSH 100 UNIT/ML IV SOLN
INTRAVENOUS | Status: AC
  Filled 2020-12-17: qty 5

## 2020-12-17 NOTE — Progress Notes (Signed)
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  INTERVAL HISTORY: Patient returns to clinic today for further evaluation and consideration of cycle 4, day 1 of single agent gemcitabine.    She reports feeling well.  Denies have headaches that may be more frequent than usual.  They are usually at night and resolve with sleep or with 800 mg ibuprofen.  She did mention this to Dr. Donella Stade at her last visit.  She has some intermittent nausea unrelated to her treatments.  She takes Compazine with relief of her symptoms.  Has stable chronic fatigue.  Appetite and weight are stable.  She had restaging scans completed yesterday.  She is here for results.   REVIEW OF SYSTEMS:   Review of Systems  Constitutional:  Positive for malaise/fatigue. Negative for fever and weight loss.  Respiratory: Negative.  Negative for cough, hemoptysis and shortness of breath.   Cardiovascular: Negative.  Negative for chest pain and leg swelling.  Gastrointestinal:  Positive for nausea. Negative for abdominal pain, constipation and diarrhea.  Genitourinary: Negative.  Negative for dysuria and flank pain.  Musculoskeletal:  Negative for back pain and joint pain.  Skin: Negative.  Negative for rash.  Neurological:  Positive for weakness and headaches. Negative for dizziness, sensory change and focal weakness.  Psychiatric/Behavioral: Negative.  The patient is not nervous/anxious.    As per HPI. Otherwise, a complete review of systems is negative.  PAST MEDICAL HISTORY: Past Medical History:  Diagnosis Date   Abnormal echocardiogram    Acid reflux    Allergic rhinitis    Anemia    HAD TO HAVE IRON INFUSIONS  BACK IN 2015   Arthritis    bil hands and back   Asthma    Bloody discharge from left nipple    Cancer of right lung ([place]) 2015   lung cancer - chemo, Dr Genevive Bi removed RML Lobectomy.    Colon polyps    COPD (chronic obstructive pulmonary disease) (HCC)    Dyspnea    WITH EXERTION   Family history of breast cancer    Family history of cervical cancer    Family history of colon cancer    Family history of skin cancer    Hypertension    Malignant neoplasm of middle lobe of right lung ([place])    Mitral valve prolapse    Mitral valve prolapse    Non-small cell lung cancer (HCC)    Personal history of chemotherapy    F/U Lung Cancer   Personal history of radiation therapy    Lung cancer   Primary cancer of right middle lobe of lung ([place]) 03/11/2016   Squamous cell carcinoma of skin 07/07/2009   right supraclavicular/EDC   Squamous cell carcinoma of skin 11/04/2019   Left pretibial. [facility].   Stage IV adenocarcinoma of lung, unspecified laterality ([place])     PAST SURGICAL HISTORY: Past Surgical History:  Procedure Laterality Date   ABDOMINAL HYSTERECTOMY     BAND HEMORRHOIDECTOMY     BREAST BIOPSY Left 03/01/2019   Procedure: LEFT BREAST EXCISIONAL BIOPSY WITH NEEDLE LOCALIZATION;  Surgeon: Herbert Pun, MD;  Location: [facility] ORS;  Service: General;  Laterality: Left;   BREAST EXCISIONAL BIOPSY Left 1994 (-), 2004 (-)  benign   BREAST EXCISIONAL BIOPSY Left 03/01/2019   - INTRADUCTAL PAPILLOMA WITH SCLEROSIS.    COLONOSCOPY  2008    Dr. Donnella Sham   COLONOSCOPY WITH PROPOFOL N/A 04/12/2019   Procedure: COLONOSCOPY WITH PROPOFOL;  Surgeon: Lollie Sails, MD;  Location: [facility] ENDOSCOPY;  Service: Endoscopy;  Laterality: N/A;   IR PORT REPAIR CENTRAL VENOUS ACCESS DEVICE  11/13/2020   IR RADIOLOGIST EVAL & MGMT  08/28/2019   IR RADIOLOGIST EVAL & MGMT  10/30/2019   IR RADIOLOGIST EVAL & MGMT  11/26/2019   KNEE SURGERY  2007   LUNG LOBECTOMY Right    middle lobe   PORTACATH  PLACEMENT Right 02/09/2017   Procedure: INSERTION PORT-A-CATH;  Surgeon: Nestor Lewandowsky, MD;  Location: [facility] ORS;  Service: General;  Laterality: Right;   RADIOLOGY WITH ANESTHESIA N/A 10/02/2019   Procedure: MICROWAVE THERMA ABLATION;  Surgeon: Aletta Edouard, MD;  Location: [facility] ORS;  Service: Radiology;  Laterality: N/A;   SALIVARY GLAND SURGERY Left 1983   with lymph node removal also   SALIVARY GLAND SURGERY     salivary gland removal    UPPER GI ENDOSCOPY  2008   VIDEO BRONCHOSCOPY WITH ENDOBRONCHIAL ULTRASOUND Right 01/24/2017   Procedure: VIDEO BRONCHOSCOPY WITH ENDOBRONCHIAL ULTRASOUND;  Surgeon: [name], MD;  Location: [facility] ORS;  Service: Pulmonary;  Laterality: Right;    FAMILY HISTORY: Family History  Problem Relation Age of Onset   Breast cancer Maternal Aunt 55   Alzheimer's disease Maternal Aunt    Breast cancer Paternal Aunt        dx. in her late 52s   Breast cancer Maternal Grandmother        dx. in her 17s   Cervical cancer Maternal Grandmother 60   Colon cancer Maternal Grandmother        dx. in her 20s   Breast cancer Maternal Aunt 60   Alzheimer's disease Maternal Aunt    Breast cancer Paternal Aunt        dx. in her late 62s   Heart disease Paternal 88    Healthy Mother    Healthy Father    Colon cancer Father 8   Skin cancer Father    Heart attack Father    Heart attack Paternal Grandfather    Skin cancer Paternal Aunt    Heart attack Paternal Uncle 58   Heart attack Paternal Uncle     ADVANCED DIRECTIVES (Y/N):  N  HEALTH MAINTENANCE: Social History   Tobacco Use   Smoking status: Never   Smokeless tobacco: Never  Vaping Use   Vaping Use: Never used  Substance Use Topics   Alcohol use: Yes    Alcohol/week: 7.0 standard drinks    Types: 7 Glasses of wine per week   Drug use: No     Colonoscopy:  PAP:  Bone density:  Lipid panel:  Allergies  Allergen Reactions   Other Other (See Comments)    Cat gut sutures get  infected Cat gut sutures get infected   Penicillins Swelling and Rash    Did it involve swelling of the face/tongue/throat, SOB, or low BP? Unknown Did it involve sudden or severe rash/hives, skin peeling, or any reaction on the inside of your mouth or nose? No Did you need to seek medical attention at a hospital or doctor's office? Yes When did it last happen?      30 + years  If all above answers are "NO", may proceed with cephalosporin use.  Sulfa Antibiotics Nausea And Vomiting    Current Outpatient Medications  Medication Sig Dispense Refill   albuterol (VENTOLIN HFA) 108 (90 Base) MCG/ACT inhaler Inhale 1-2 puffs into the lungs every 6 (six) hours as needed for wheezing or shortness of breath. 1 g 10   ALPRAZolam (XANAX) 0.25 MG tablet Take 1 tablet (0.25 mg total) by mouth at bedtime as needed for anxiety. 30 tablet 0   ALPRAZolam (XANAX) 0.5 MG tablet take 1-2 tablets by mouth at night as needed for sleep 60 tablet 3   ascorbic acid (VITAMIN C) 100 MG tablet Take 100 mg by mouth daily.      B Complex Vitamins (VITAMIN B COMPLEX PO) Take 1 tablet by mouth daily.      Biotin w/ Vitamins C & E (HAIR/SKIN/NAILS PO) Take 1 tablet by mouth daily.     Calcium Carb-Cholecalciferol 600-800 MG-UNIT TABS Take 2 tablets by mouth daily.     Cholecalciferol 125 MCG (5000 UT) capsule Take 5,000 Units by mouth daily.     clobetasol ointment (TEMOVATE) 0.53 % Apply 1 application topically 2 (two) times daily. Dry area and apply to mouth sores twice daily as needed for up to 2 weeks. 15 g 0   COLLAGEN PO Take 1,000 mg by mouth daily.     cyclobenzaprine (FLEXERIL) 5 MG tablet Take 1 tablet (5 mg total) by mouth 3 (three) times daily as needed for muscle spasms. 30 tablet 0   fexofenadine (ALLEGRA) 180 MG tablet Take 180 mg by mouth daily as needed for allergies.      fluticasone (FLONASE) 50 MCG/ACT nasal spray Place 2 sprays into both nostrils daily as needed for allergies or rhinitis.      fluticasone-salmeterol (ADVAIR) 250-50 MCG/ACT AEPB Inhale 1 puff into the lungs 2 (two) times daily as needed for breathing issues. 60 each 8   Fluticasone-Salmeterol (ADVAIR) 250-50 MCG/DOSE AEPB INHALE 1 PUFF BY MOUTH 2 TIMES DAILY AS NEEDED FOR BREATHING ISSUES 60 each 10   ibuprofen (ADVIL) 800 MG tablet TAKE 1 TABLET BY MOUTH EVERY 8 HOURS AS NEEDED FOR PAIN **ALWAYS TAKE WITH FOOD** 90 tablet 2   lansoprazole (PREVACID) 30 MG capsule TAKE 1 CAPSULE BY MOUTH TWICE DAILY 180 capsule 3   lidocaine-prilocaine (EMLA) cream Apply 1 application topically as needed. Apply generously over the Mediport 45 minutes prior to chemotherapy. 30 g 0   magic mouthwash SOLN Take 5-10 mLs by mouth 4 (four) times daily as needed for mouth pain. 480 mL 0   metoprolol succinate (TOPROL-XL) 25 MG 24 hr tablet Take 0.5 tablets (12.5 mg total) by mouth once daily 15 tablet 6   ondansetron (ZOFRAN) 8 MG tablet Take 1 tablet (8 mg total) by mouth every 8 (eight) hours as needed for nausea or vomiting (start 3 days; after chemo). 40 tablet 1   osimertinib mesylate (TAGRISSO) 80 MG tablet Take 80 mg by mouth daily.     polyethylene glycol (MIRALAX / GLYCOLAX) packet Take 17 g by mouth daily as needed for mild constipation.     prochlorperazine (COMPAZINE) 10 MG tablet Take 1 tablet (10 mg total) by mouth every 6 (six) hours as needed for nausea or vomiting. 60 tablet 2   triamcinolone (KENALOG) 0.1 % paste Use as directed 1 application in the mouth or throat 2 (two) times daily. 15 g 12   Vitamin D, Ergocalciferol, (DRISDOL) 1.25 MG (50000 UNIT) CAPS capsule TAKE 1 CAPSULE (50,000 UNITS TOTAL) BY MOUTH ONCE A WEEK 12  capsule 3   zinc gluconate 50 MG tablet Take 50 mg by mouth daily.     No current facility-administered medications for this visit.   Facility-Administered Medications Ordered in Other Visits  Medication Dose Route Frequency Provider Last Rate Last Admin   heparin lock flush 100 unit/mL  500 Units  Intravenous Once Grayland Ormond, Kathlene November, MD       sodium chloride flush (NS) 0.9 % injection 10 mL  10 mL Intravenous PRN Lloyd Huger, MD       sodium chloride flush (NS) 0.9 % injection 10 mL  10 mL Intravenous PRN Lloyd Huger, MD   10 mL at 01/22/18 1419    OBJECTIVE: Vitals:   12/17/20 1111  BP: 119/80  Pulse: (!) 103  Resp: 20  SpO2: 99%     Body mass index is 20.14 kg/m.    ECOG FS:1 - Symptomatic but completely ambulatory  Physical Exam Constitutional:      Appearance: Normal appearance.  HENT:     Head: Normocephalic and atraumatic.  Eyes:     Pupils: Pupils are equal, round, and reactive to light.  Cardiovascular:     Rate and Rhythm: Normal rate and regular rhythm.     Heart sounds: Normal heart sounds. No murmur heard. Pulmonary:     Effort: Pulmonary effort is normal.     Breath sounds: Normal breath sounds. No wheezing.  Abdominal:     General: Bowel sounds are normal. There is no distension.     Palpations: Abdomen is soft.     Tenderness: There is no abdominal tenderness.  Musculoskeletal:        General: Normal range of motion.     Cervical back: Normal range of motion.  Skin:    General: Skin is warm and dry.     Findings: No rash.  Neurological:     Mental Status: She is alert and oriented to person, place, and time.  Psychiatric:        Judgment: Judgment normal.    LAB RESULTS:  Lab Results  Component Value Date   NA 133 (L) 12/17/2020   K 4.1 12/17/2020   CL 99 12/17/2020   CO2 25 12/17/2020   GLUCOSE 105 (H) 12/17/2020   BUN 17 12/17/2020   CREATININE 1.11 (H) 12/17/2020   CALCIUM 8.8 (L) 12/17/2020   PROT 7.3 12/17/2020   ALBUMIN 3.4 (L) 12/17/2020   AST 27 12/17/2020   ALT 17 12/17/2020   ALKPHOS 136 (H) 12/17/2020   BILITOT 0.2 (L) 12/17/2020   GFRNONAA 53 (L) 12/17/2020   GFRAA >60 03/23/2020    Lab Results  Component Value Date   WBC 5.9 12/17/2020   NEUTROABS 4.3 12/17/2020   HGB 9.7 (L) 12/17/2020   HCT  29.9 (L) 12/17/2020   MCV 88.2 12/17/2020   PLT 351 12/17/2020     STUDIES: MR Brain W Wo Contrast  Result Date: 12/16/2020 CLINICAL DATA:  Lung cancer.  Assess for metastatic disease. EXAM: MRI HEAD WITHOUT AND WITH CONTRAST TECHNIQUE: Multiplanar, multiecho pulse sequences of the brain and surrounding structures were obtained without and with intravenous contrast. CONTRAST:  67m GADAVIST GADOBUTROL 1 MMOL/ML IV SOLN COMPARISON:  10/05/2020 FINDINGS: Brain: Diffusion imaging does not show any acute or subacute infarction or other cause of restricted diffusion. The brain shows mild age related atrophy without evidence of prior small or large vessel infarction. The brainstem and cerebellum are normal. Cerebral hemispheres show a dilated perivascular space in the external  capsule on the left. No metastatic disease seen in the cerebellum. There is an insignificant venous angioma on the right. There is a 2 mm metastasis in the right posterior inferior temporal lobe, axial image 58. There is a 2 x 3 mm surface metastasis along the gyral surface of a right posterior frontal gyrus axial image 115. Second surface metastasis along the gyral surface in the right parietal region axial image 115. 2 mm metastasis along the gyral surface of a medial right frontal gyrus axial image 118. Subtle punctate focus of enhancement along the gyral surface in the left parietal lobe axial image 126, actually better seen on the prior exam. Vascular: Major vessels at the base of the brain show flow. Skull and upper cervical spine: Negative Sinuses/Orbits: Clear/normal Other: None IMPRESSION: Four tiny metastases noted in the right hemisphere as outlined above. One tiny metastasis in the left hemisphere, less conspicuous than on the study of 10/05/2020. No newly seen lesion. Electronically Signed   By: Nelson Chimes M.D.   On: 12/16/2020 19:21   CT CHEST ABDOMEN PELVIS W CONTRAST  Result Date: 12/17/2020 CLINICAL DATA:  Non-small  cell lung cancer, status post right middle lobectomy and radiation therapy with metastatic recurrence EXAM: CT CHEST, ABDOMEN, AND PELVIS WITH CONTRAST TECHNIQUE: Multidetector CT imaging of the chest, abdomen and pelvis was performed following the standard protocol during bolus administration of intravenous contrast. CONTRAST:  80m OMNIPAQUE IOHEXOL 300 MG/ML SOLN, additional oral enteric contrast COMPARISON:  Outside CT chest abdomen pelvis, 10/05/2020 FINDINGS: CT CHEST FINDINGS Cardiovascular: Right chest port catheter. Scattered aortic atherosclerosis. Normal heart size. No pericardial effusion. Mediastinum/Nodes: Unchanged post treatment appearance of ill-defined soft tissue about the right hilum and mediastinum, without discretely enlarged mediastinal lymph nodes. There are bulky left supraclavicular and cervical lymph nodes, largest measuring 3.1 x 2.2 cm, unchanged (series 2, image 7). Thyroid gland, trachea, and esophagus demonstrate no significant findings. Lungs/Pleura: Unchanged appearance of the right hemithorax, with extensive soft tissue and post treatment fibrosis about the right hilum, diffuse interlobular septal thickening and heterogeneous airspace opacity, as well as diffuse right-sided pleural thickening and a small, loculated right pleural effusion. There are multiple small pulmonary nodules throughout the lungs, not significantly changed, an index nodule of the left upper lobe measuring 4 mm (series 3, image 56) and an index nodule of the medial left lung base measuring 5 mm (series 3, image 117). Musculoskeletal: No chest wall mass or suspicious bone lesions identified. CT ABDOMEN PELVIS FINDINGS Hepatobiliary: Numerous low-attenuation lesions throughout the liver are increased in size compared to prior examination, an index lesion of the left lobe of the liver measuring 3.1 x 2.6 cm, previously 2.2 x 1.9 cm (series 2, image 56). An index lesion of the peripheral liver dome measures 2.6 x  2.6 cm, previously 2.4 x 2.1 cm (series 2, image 52). No gallstones, gallbladder wall thickening, or biliary dilatation. Pancreas: Unremarkable. No pancreatic ductal dilatation or surrounding inflammatory changes. Spleen: Normal in size without significant abnormality. Adrenals/Urinary Tract: Adrenal glands are unremarkable. There is a large exophytic mass of the lateral midportion of the left kidney, slightly increased in size, measuring 5.4 x 3.5 cm, previously 4.7 x 3.3 cm (series 2, image 71). Bladder is unremarkable. Stomach/Bowel: Stomach is within normal limits. Appendix appears normal. No evidence of bowel wall thickening, distention, or inflammatory changes. Vascular/Lymphatic: No significant vascular findings are present. Redemonstrated bulky retroperitoneal lymphadenopathy, largest node conglomerate in the left retroperitoneum measuring 4.3 x 3.7 cm, not significantly changed (  series 2, image 70). Reproductive: Status post hysterectomy. Other: No abdominal wall hernia or abnormality. No abdominopelvic ascites. Musculoskeletal: No acute or significant osseous findings. IMPRESSION: 1. Unchanged post treatment appearance of the right hemithorax, with extensive soft tissue and post treatment fibrosis about the right hilum, diffuse interlobular septal thickening and heterogeneous airspace opacity, as well as diffuse right-sided pleural thickening and a small, loculated right pleural effusion. Findings are consistent with advanced lung malignancy including pleural and lymphangitic disease. 2. There are multiple small pulmonary nodules throughout the lungs, not significantly changed, consistent with pulmonary metastatic disease. 3. Numerous low-attenuation lesions throughout the liver are increased in size compared to prior examination, findings consistent with worsened hepatic metastatic disease. 4. Large exophytic mass of the lateral midportion of the left kidney, increased in size, consistent with worsened  metastasis. 5. Redemonstrated bulky retroperitoneal lymphadenopathy, not significantly changed. 6. Overall constellation of findings with significant evidence of metastatic disease progression in the abdomen and pelvis, however findings in the chest are stable. Aortic Atherosclerosis (ICD10-I70.0). Electronically Signed   By: Eddie Candle M.D.   On: 12/17/2020 09:21     ONCOLOGY HISTORY: Patient initially underwent resection in May 2015 and was noted to have the EGFR mutation.  She initially received 4 cycles of adjuvant cisplatin and Alimta completing on January 23, 2014.  Patient was on the [facility] protocol for maintenance Tarceva versus placebo, but discontinued treatment in November 2015 because of significant side effects.  Although blinded, presumption was she was receiving Tarceva.  She recently was noted to have a recurrence confirmed by right paratracheal lymph node biopsy on January 24, 2017.  PET scan on February 03, 2017 did not reveal any distant metastasis.  She underwent treatment with weekly carboplatinum and Taxol along with daily XRT finishing in October 2018.  She initiated maintenance durvalumab on June 29, 2017.  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  Patient received Tagrisso from April 2019 through September 2021. PET scan results from July 09, 2019 revealed mild progression of disease in her lungs with an isolated left kidney metastasis that has undergone ablation. PET scan on March 11, 2020 revealed continued progression of disease in her lungs.    She subsequently discontinued Tagrisso and initiated single agent pemetrexed on March 23, 2020.    Patient enrolled in clinical trial at [facility] between December 2021 and April 2022.  She initiated single agent gemcitabine on October 15, 2020.   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   PLAN:    Recurrent stage IV  adenocarcinoma of the lung:  Status post several lines of treatment but currently on single agent gemcitabine. Previously she was on a clinical trial at [facility] but noted to have progression of disease from scans in April 2022. She is status post 3 cycles of gemcitabine and is tolerating fairly well. Repeat imaging from 12/16/2020 showed a mixed response-unchanged appearance in her chest but worsening disease in her abdomen and pelvis.  Recommend she continue treatment with gemcitabine until we are able to discuss further with Dr. Sharlet Salina at [facility]. MRI of her brain showed 4 tiny metastasis noted in the right hemisphere as outlined above.  1 tiny metastasis in left hemisphere less conspicuous than on the other study.  No new lesions identified. Patient was in agreement with this plan.  Headaches: Unclear etiology. MRI appears stable.  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 She takes Compazine with relief of her symptoms.  Anemia: Secondary to treatments. Improved from previous. We will continue to monitor.  Disposition: Proceed with treatment today. RTC on 12/24/2020 for cycle 4-day 8 gemcitabine. Will discuss with Dr. Grayland Ormond and Dr. Sharlet Salina results of her imaging to see if she should continue treatment.  Given she has had a mixed response, would recommend continuing treatment today.  Greater than 50% was spent in counseling and coordination of care with this patient including but not limited to discussion of the relevant topics above (See A&P) including, but not limited to diagnosis and management of acute and chronic medical conditions.   Patient expressed understanding and was in agreement with this plan. She also understands that She can call clinic at any time with any questions, concerns, or complaints.    Jacquelin Hawking, NP   12/17/2020 11:25 AM

## 2020-12-17 NOTE — Progress Notes (Signed)
Patient here today for follow up and treatment consideration regarding lung cancer.

## 2020-12-17 NOTE — Patient Instructions (Signed)
[facility]    Discharge Instructions: Thank you for choosing [facility] to provide your oncology and hematology care.  If you have a lab appointment with the [facility], please go directly to the [facility] and check in at the registration area.  Wear comfortable clothing and clothing appropriate for easy access to any Portacath or PICC line.   We strive to give you quality time with your provider. You may need to reschedule your appointment if you arrive late (15 or more minutes).  Arriving late affects you and other patients whose appointments are after yours.  Also, if you miss three or more appointments without notifying the office, you may be dismissed from the clinic at the provider's discretion.      For prescription refill requests, have your pharmacy contact our office and allow 72 hours for refills to be completed.    Today you received the following chemotherapy and/or immunotherapy agents - Gemzar      To help prevent nausea and vomiting after your treatment, we encourage you to take your nausea medication as directed.  BELOW ARE SYMPTOMS THAT SHOULD BE REPORTED IMMEDIATELY: *FEVER GREATER THAN 100.4 F (38 C) OR HIGHER *CHILLS OR SWEATING *NAUSEA AND VOMITING THAT IS NOT CONTROLLED WITH YOUR NAUSEA MEDICATION *UNUSUAL SHORTNESS OF BREATH *UNUSUAL BRUISING OR BLEEDING *URINARY PROBLEMS (pain or burning when urinating, or frequent urination) *BOWEL PROBLEMS (unusual diarrhea, constipation, pain near the anus) TENDERNESS IN MOUTH AND THROAT WITH OR WITHOUT PRESENCE OF ULCERS (sore throat, sores in mouth, or a toothache) UNUSUAL RASH, SWELLING OR PAIN  UNUSUAL VAGINAL DISCHARGE OR ITCHING   Items with * indicate a potential emergency and should be followed up as soon as possible or go to the Emergency Department if any problems should occur.  Please show the CHEMOTHERAPY ALERT CARD or IMMUNOTHERAPY ALERT CARD at  check-in to the Emergency Department and triage nurse.  Should you have questions after your visit or need to cancel or reschedule your appointment, please contact [facility]  [phone] and follow the prompts.  Office hours are 8:00 a.m. to 4:30 p.m. Monday - Friday. Please note that voicemails left after 4:00 p.m. may not be returned until the following business day.  We are closed weekends and major holidays. You have access to a nurse at all times for urgent questions. Please call the main number to the clinic [phone] and follow the prompts.  For any non-urgent questions, you may also contact your provider using MyChart. We now offer e-Visits for anyone 17 and older to request care online for non-urgent symptoms. For details visit [email or web address].   Also download the MyChart app! Go to the app store, search "MyChart", open the app, select [facility], and log in with your MyChart username and password.  Due to Covid, a mask is required upon entering the hospital/clinic. If you do not have a mask, one will be given to you upon arrival. For doctor visits, patients may have 1 support person aged 64 or older with them. For treatment visits, patients cannot have anyone with them due to current Covid guidelines and our immunocompromised population.   Prochlorperazine tablets What is this medication? PROCHLORPERAZINE (proe klor PER a zeen) helps to control severe nausea and vomiting. This medicine is also used to treat schizophrenia. It can also helppatients who experience anxiety that is not due to psychological illness. This medicine may be used for other purposes; ask your health  care provider orpharmacist if you have questions. COMMON BRAND NAME(S): Compazine What should I tell my care team before I take this medication? They need to know if you have any of these conditions: blockage in your bowel brain tumor dementia diabetes difficulty  swallowing glaucoma have trouble controlling your muscles head injury heart disease history of irregular heartbeat if you often drink alcohol liver disease low blood counts, like low Aryiah Monterosso cell, platelet, or red cell counts low blood pressure lung or breathing disease, like asthma Parkinson's disease prostate disease seizures trouble passing urine an unusual or allergic reaction to prochlorperazine, other medicines, foods, dyes, or preservatives pregnant or trying to get pregnant breast-feeding How should I use this medication? Take this medicine by mouth with a glass of water. Follow the directions on the prescription label. Take your doses at regular intervals. Do not take your medicine more often than directed. Do not stop taking this medicine suddenly. This can cause nausea, vomiting, and dizziness. Ask your doctor or health careprofessional for advice. Talk to your pediatrician regarding the use of this medicine in children. Special care may be needed. While this drug may be prescribed for children asyoung as 2 years for selected conditions, precautions do apply. Overdosage: If you think you have taken too much of this medicine contact [facility]ison control center or emergency room at once. NOTE: This medicine is only for you. Do not share this medicine with others. What if I miss a dose? If you miss a dose, take it as soon as you can. If it is almost time for yournext dose, take only that dose. Do not take double or extra doses. What may interact with this medication? Do not take this medicine with any of the following medications: cisapride dofetilide dronedarone metoclopramide pimozide saquinavir thioridazine This medicine may also interact with the following medications: alcohol antihistamines for allergy, cough, and cold atropine certain medicines for anxiety or sleep certain medicines for bladder problems like oxybutynin, tolterodine certain medicines for depression like  amitriptyline, fluoxetine, sertraline certain medicines for stomach problems like dicyclomine, hyoscyamine certain medicines for travel sickness like scopolamine epinephrine general anesthetics like halothane, isoflurane, methoxyflurane, propofol ipratropium levodopa or other medicines for Parkinson's disease lithium medicines for blood pressure medicines for seizures like phenobarbital, primidone, phenytoin medicines that relax muscles for surgery narcotic medicines for pain propranolol warfarin This list may not describe all possible interactions. Give your health care provider a list of all the medicines, herbs, non-prescription drugs, or dietary supplements you use. Also tell them if you smoke, drink alcohol, or use illegaldrugs. Some items may interact with your medicine. What should I watch for while using this medication? Visit your health care professional for regular checks on your progress. Tell your health care professional if symptoms do not start to get better or if theyget worse. You may get drowsy or dizzy. Do not drive, use machinery, or do anything that needs mental alertness until you know how this medicine affects you. Do not stand or sit up quickly, especially if you are an older patient. This reduces the risk of dizzy or fainting spells. Alcohol may interfere with the effect ofthis medicine. Avoid alcoholic drinks. This drug can cause problems with controlling your body temperature. It can lower the response of your body to cold temperatures. If possible, stay indoors during cold weather. If you must go outdoors, wear warm clothes. It can also lower the response of your body to heat. Do not overheat. Do not over-exercise. Stay out of the  sun when possible. If you must be in the sun, wear cool clothing. Drink plenty of water. If you have trouble controlling your bodytemperature, call your health care provider right away. This medicine may increase blood sugar. Ask your health  care provider ifchanges in diet or medicines are needed if you have diabetes. This medicine can make you more sensitive to the sun. Keep out of the sun. If you cannot avoid being in the sun, wear protective clothing and use sunscreen.Do not use sun lamps or tanning beds/booths. Your mouth may get dry. Chewing sugarless gum or sucking hard candy, and drinking plenty of water may help. Contact your doctor if the problem does notgo away or is severe. What side effects may I notice from receiving this medication? Side effects that you should report to your doctor or health care professionalas soon as possible: allergic reactions like skin rash, itching or hives, swelling of the face, lips, or tongue abnormal production of milk breast enlargement in both males and females changes in vision chest pain confusion fast, irregular heartbeat fever, chills, sore throat seizures signs and symptoms of high blood sugar such as being more thirsty or hungry or having to urinate more than normal. You may also feel very tired or have blurry vision. signs and symptoms of liver injury like dark yellow or brown urine; general ill feeling or flu-like symptoms; light-colored stools; loss of appetite; nausea; right upper belly pain; unusually weak or tired; yellowing of the eyes or skin signs and symptoms of low blood pressure like dizziness; feeling faint or lightheaded, falls; unusually weak or tired trouble passing urine or change in the amount of urine trouble swallowing uncontrollable movements of the arms, face, head, mouth, neck, or upper body unusual bruising or bleeding unusually weak or tired Side effects that usually do not require medical attention (report to yourdoctor or health care professional if they continue or are bothersome): constipation drowsiness dry mouth This list may not describe all possible side effects. Call your doctor for medical advice about side effects. You may report side effects  to FDA at1-800-FDA-1088. Where should I keep my medication? Keep out of the reach of children. Store at room temperature between 15 and 30 degrees C (59 and 86 degrees F).Protect from light. Throw away any unused medicine after the expiration date. NOTE: This sheet is a summary. It may not cover all possible information. If you have questions about this medicine, talk to your doctor, pharmacist, orhealth care provider.  2022 Elsevier/Gold Standard (2019-05-14 12:26:11)  Gemcitabine injection What is this medication? GEMCITABINE (jem SYE ta been) is a chemotherapy drug. This medicine is used to treat many types of cancer like breast cancer, lung cancer, pancreatic cancer,and ovarian cancer. This medicine may be used for other purposes; ask your health care provider orpharmacist if you have questions. COMMON BRAND NAME(S): Gemzar, Infugem What should I tell my care team before I take this medication? They need to know if you have any of these conditions: blood disorders infection kidney disease liver disease lung or breathing disease, like asthma recent or ongoing radiation therapy an unusual or allergic reaction to gemcitabine, other chemotherapy, other medicines, foods, dyes, or preservatives pregnant or trying to get pregnant breast-feeding How should I use this medication? This drug is given as an infusion into a vein. It is administered in a hospitalor clinic by a specially trained health care professional. Talk to your pediatrician regarding the use of this medicine in children.Special care may be needed. Overdosage:  If you think you have taken too much of this medicine contact apoison control center or emergency room at once. NOTE: This medicine is only for you. Do not share this medicine with others. What if I miss a dose? It is important not to miss your dose. Call your doctor or health careprofessional if you are unable to keep an appointment. What may interact with this  medication? medicines to increase blood counts like filgrastim, pegfilgrastim, sargramostim some other chemotherapy drugs like cisplatin vaccines Talk to your doctor or health care professional before taking any of thesemedicines: acetaminophen aspirin ibuprofen ketoprofen naproxen This list may not describe all possible interactions. Give your health care provider a list of all the medicines, herbs, non-prescription drugs, or dietary supplements you use. Also tell them if you smoke, drink alcohol, or use illegaldrugs. Some items may interact with your medicine. What should I watch for while using this medication? Visit your doctor for checks on your progress. This drug may make you feel generally unwell. This is not uncommon, as chemotherapy can affect healthy cells as well as cancer cells. Report any side effects. Continue your course oftreatment even though you feel ill unless your doctor tells you to stop. In some cases, you may be given additional medicines to help with side effects.Follow all directions for their use. Call your doctor or health care professional for advice if you get a fever, chills or sore throat, or other symptoms of a cold or flu. Do not treat yourself. This drug decreases your body's ability to fight infections. Try toavoid being around people who are sick. This medicine may increase your risk to bruise or bleed. Call your doctor orhealth care professional if you notice any unusual bleeding. Be careful brushing and flossing your teeth or using a toothpick because you may get an infection or bleed more easily. If you have any dental work done,tell your dentist you are receiving this medicine. Avoid taking products that contain aspirin, acetaminophen, ibuprofen, naproxen, or ketoprofen unless instructed by your doctor. These medicines may hide afever. Do not become pregnant while taking this medicine or for 6 months after stopping it. Women should inform their doctor if  they wish to become pregnant or think they might be pregnant. Men should not father a child while taking this medicine and for 3 months after stopping it. There is a potential for serious side effects to an unborn child. Talk to your health care professional or pharmacist for more information. Do not breast-feed an infant while takingthis medicine or for at least 1 week after stopping it. Men should inform their doctors if they wish to father a child. This medicine may lower sperm counts. Talk with your doctor or health care professional ifyou are concerned about your fertility. What side effects may I notice from receiving this medication? Side effects that you should report to your doctor or health care professionalas soon as possible: allergic reactions like skin rash, itching or hives, swelling of the face, lips, or tongue breathing problems pain, redness, or irritation at site where injected signs and symptoms of a dangerous change in heartbeat or heart rhythm like chest pain; dizziness; fast or irregular heartbeat; palpitations; feeling faint or lightheaded, falls; breathing problems signs of decreased platelets or bleeding - bruising, pinpoint red spots on the skin, black, tarry stools, blood in the urine signs of decreased red blood cells - unusually weak or tired, feeling faint or lightheaded, falls signs of infection - fever or chills, cough, sore throat,  pain or difficulty passing urine signs and symptoms of kidney injury like trouble passing urine or change in the amount of urine signs and symptoms of liver injury like dark yellow or brown urine; general ill feeling or flu-like symptoms; light-colored stools; loss of appetite; nausea; right upper belly pain; unusually weak or tired; yellowing of the eyes or skin swelling of ankles, feet, hands Side effects that usually do not require medical attention (report to yourdoctor or health care professional if they continue or are  bothersome): constipation diarrhea hair loss loss of appetite nausea rash vomiting This list may not describe all possible side effects. Call your doctor for medical advice about side effects. You may report side effects to FDA at1-800-FDA-1088. Where should I keep my medication? This drug is given in a hospital or clinic and will not be stored at home. NOTE: This sheet is a summary. It may not cover all possible information. If you have questions about this medicine, talk to your doctor, pharmacist, orhealth care provider.  2022 [employer]vier/Gold Standard (2017-09-13 18:06:11)

## 2020-12-17 NOTE — Progress Notes (Signed)
HR 103 ok to proceed per NP

## 2020-12-22 ENCOUNTER — Ambulatory Visit (INDEPENDENT_AMBULATORY_CARE_PROVIDER_SITE_OTHER): Payer: Medicare Other | Admitting: Internal Medicine

## 2020-12-22 ENCOUNTER — Encounter: Payer: Self-pay | Admitting: Internal Medicine

## 2020-12-22 ENCOUNTER — Other Ambulatory Visit: Payer: Self-pay

## 2020-12-22 ENCOUNTER — Ambulatory Visit (HOSPITAL_COMMUNITY)
Admission: RE | Admit: 2020-12-22 | Discharge: 2020-12-22 | Disposition: A | Discharge status: Home / Self Care | Payer: Medicare Other | Source: Ambulatory Visit

## 2020-12-22 ENCOUNTER — Ambulatory Visit: Payer: Medicare Other | Attending: Ophthalmology | Admitting: Ophthalmology

## 2020-12-22 ENCOUNTER — Encounter (INDEPENDENT_AMBULATORY_CARE_PROVIDER_SITE_OTHER): Payer: Self-pay | Admitting: Ophthalmology

## 2020-12-22 VITALS — BP 120/60 | HR 91 | Temp 97.7°F | Ht 62.0 in | Wt 117.2 lb

## 2020-12-22 DIAGNOSIS — C3490 Malignant neoplasm of unspecified part of unspecified bronchus or lung: Secondary | ICD-10-CM | POA: Diagnosis not present

## 2020-12-22 DIAGNOSIS — J90 Pleural effusion, not elsewhere classified: Secondary | ICD-10-CM | POA: Diagnosis not present

## 2020-12-22 DIAGNOSIS — Z9889 Other specified postprocedural states: Secondary | ICD-10-CM

## 2020-12-22 MED ORDER — NEOMYCIN 3.5 MG/G-POLYMYXIN B 10,000 UNIT/G-DEXAMETH 0.1 % EYE OINT
TOPICAL_OINTMENT | OPHTHALMIC | 0 refills | Status: DC
Start: 2020-12-22 — End: 2022-04-25

## 2020-12-22 NOTE — Progress Notes (Signed)
[facility] Consultation    Update: Pt seen and evaluated before pt going for procedure, new changes in exam have been noted.     Date: 12/22/2020  MRN# [id number] Lauren Page 1950/08/13    Lauren Page is a 70 y.o. old female seen in consultation for chief complaint of: SOB/DOE and COPD    PREVIOUS HPI 70 year old woman who is status post right middle lobectomy several years ago. She is now about 2-1/2 years out from a right thoracotomy and right middle lobectomy for a adenocarcinoma the lung with pleural invasion, she then completed 4 cycles of chemotherapy on January 23, 2014   About a year ago she did have a small nodule identified along the superior segment right lower lobe close to the area of the fissure, she was found to have a nodular density in the superior segment of the right lower lobe. A subsequent PET scan was performed which revealed no evidence of uptake in the hypermetabolic range.   A repeat CT scan July 2018 There are some bilateral hilar adenopathy which is increased slightly in size.  The largest lymph node now measures over 1.5 cm in the right hilum. The left hilum has a 9 mm lymph node. These have increased from before.   EBUS shows recurrence Of Adenocarcioma-underwent Chemo/RXT  CT scans subsequently showed RT lung pneumonitis from RXT  3.20.19 scans show lung damage and effusion RT sided  Patient also has increased WOB and SOB and cough CT shows pleural effusion as well US guided thoracentesis performed-+malgnant cells with adenocarcinoma  CC Follow-up shortness of breath Follow-up COPD  follow-up lung cancer    HPI COPD seems stable at this time Chronic shortness of breath Chronic dyspnea exertion seems to be stable  Advair daily Albuterol as needed   No exacerbation at this time No evidence of heart failure at this time No evidence or signs of infection at this time No respiratory distress No fevers, chills, nausea,  vomiting, diarrhea No evidence of lower extremity edema No evidence hemoptysis  Previous office visit PET scan showed increased uptake in the pleural effusion and a right kidney mass was noted   Patient is not in any acute distress at this time  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 Previous history of COVID-positive status post monoclonal antibody infusion   Medication:    Current Outpatient Medications:    albuterol (VENTOLIN HFA) 108 (90 Base) MCG/ACT inhaler, Inhale 1-2 puffs into the lungs every 6 (six) hours as needed for wheezing or shortness of breath., Disp: 1 g, Rfl: 10   ALPRAZolam (XANAX) 0.25 MG tablet, Take 1 tablet (0.25 mg total) by mouth at bedtime as needed for anxiety., Disp: 30 tablet, Rfl: 0   ALPRAZolam (XANAX) 0.5 MG tablet, take 1-2 tablets by mouth at night as needed for sleep, Disp: 60 tablet, Rfl: 3   ascorbic acid (VITAMIN C) 100 MG tablet, Take 100 mg by mouth daily. , Disp: , Rfl:    B Complex Vitamins (VITAMIN B COMPLEX PO), Take 1 tablet by mouth daily. , Disp: , Rfl:    Biotin w/ Vitamins C & E (HAIR/SKIN/NAILS PO), Take 1 tablet by mouth daily., Disp: , Rfl:    Calcium Carb-Cholecalciferol 600-800 MG-UNIT TABS, Take 2 tablets by mouth daily., Disp: , Rfl:    Cholecalciferol 125 MCG (5000 UT) capsule, Take 5,000 Units by mouth daily., Disp: , Rfl:    clobetasol ointment (TEMOVATE) 8.46 %, Apply 1 application topically 2 (  two) times daily. Dry area and apply to mouth sores twice daily as needed for up to 2 weeks., Disp: 15 g, Rfl: 0   COLLAGEN PO, Take 1,000 mg by mouth daily., Disp: , Rfl:    cyclobenzaprine (FLEXERIL) 5 MG tablet, Take 1 tablet (5 mg total) by mouth 3 (three) times daily as needed for muscle spasms., Disp: 30 tablet, Rfl: 0   fexofenadine (ALLEGRA) 180 MG tablet, Take 180 mg by mouth daily as needed for allergies. , Disp: , Rfl:    fluticasone (FLONASE) 50 MCG/ACT nasal spray, Place 2 sprays into both nostrils daily as needed for  allergies or rhinitis., Disp: , Rfl:    fluticasone-salmeterol (ADVAIR) 250-50 MCG/ACT AEPB, Inhale 1 puff into the lungs 2 (two) times daily as needed for breathing issues., Disp: 60 each, Rfl: 8   Fluticasone-Salmeterol (ADVAIR) 250-50 MCG/DOSE AEPB, INHALE 1 PUFF BY MOUTH 2 TIMES DAILY AS NEEDED FOR BREATHING ISSUES, Disp: 60 each, Rfl: 10   ibuprofen (ADVIL) 800 MG tablet, TAKE 1 TABLET BY MOUTH EVERY 8 HOURS AS NEEDED FOR PAIN **ALWAYS TAKE WITH FOOD**, Disp: 90 tablet, Rfl: 2   lansoprazole (PREVACID) 30 MG capsule, TAKE 1 CAPSULE BY MOUTH TWICE DAILY, Disp: 180 capsule, Rfl: 3   lidocaine-prilocaine (EMLA) cream, Apply 1 application topically as needed. Apply generously over the Mediport 45 minutes prior to chemotherapy., Disp: 30 g, Rfl: 0   magic mouthwash SOLN, Take 5-10 mLs by mouth 4 (four) times daily as needed for mouth pain., Disp: 480 mL, Rfl: 0   metoprolol succinate (TOPROL-XL) 25 MG 24 hr tablet, Take 0.5 tablets (12.5 mg total) by mouth once daily, Disp: 15 tablet, Rfl: 6   ondansetron (ZOFRAN) 8 MG tablet, Take 1 tablet (8 mg total) by mouth every 8 (eight) hours as needed for nausea or vomiting (start 3 days; after chemo)., Disp: 40 tablet, Rfl: 1   osimertinib mesylate (TAGRISSO) 80 MG tablet, Take 80 mg by mouth daily., Disp: , Rfl:    polyethylene glycol (MIRALAX / GLYCOLAX) packet, Take 17 g by mouth daily as needed for mild constipation., Disp: , Rfl:    prochlorperazine (COMPAZINE) 10 MG tablet, Take 1 tablet (10 mg total) by mouth every 6 (six) hours as needed for nausea or vomiting., Disp: 60 tablet, Rfl: 2   triamcinolone (KENALOG) 0.1 % paste, Use as directed 1 application in the mouth or throat 2 (two) times daily., Disp: 15 g, Rfl: 12   Vitamin D, Ergocalciferol, (DRISDOL) 1.25 MG (50000 UNIT) CAPS capsule, TAKE 1 CAPSULE (50,000 UNITS TOTAL) BY MOUTH ONCE A WEEK, Disp: 12 capsule, Rfl: 3   zinc gluconate 50 MG tablet, Take 50 mg by mouth daily., Disp: , Rfl:  No  current facility-administered medications for this visit.  Facility-Administered Medications Ordered in Other Visits:    heparin lock flush 100 unit/mL, 500 Units, Intravenous, Once, Finnegan, Kathlene November, MD   sodium chloride flush (NS) 0.9 % injection 10 mL, 10 mL, Intravenous, PRN, [place] Ormond, Kathlene November, MD   sodium chloride flush (NS) 0.9 % injection 10 mL, 10 mL, Intravenous, PRN, Lloyd Huger, MD, 10 mL at 01/22/18 1419   Allergies:  Other, Penicillins, and Sulfa antibiotics   Review of Systems:  Gen:  Denies  fever, sweats, chills weight loss  HEENT: Denies blurred vision, double vision, ear pain, eye pain, hearing loss, nose bleeds, sore throat Cardiac:  No dizziness, chest pain or heaviness, chest tightness,edema, No JVD Resp:   No cough, -sputum production, -  shortness of breath,-wheezing, -hemoptysis,  Gi: Denies swallowing difficulty, stomach pain, nausea or vomiting, diarrhea, constipation, bowel incontinence Gu:  Denies bladder incontinence, burning urine Ext:   Denies Joint pain, stiffness or swelling Skin: Denies  skin rash, easy bruising or bleeding or hives Endoc:  Denies polyuria, polydipsia , polyphagia or weight change Psych:   Denies depression, insomnia or hallucinations  Other:  All other systems negative   BP 120/60 (BP Location: Left Arm, Patient Position: Sitting, Cuff Size: Normal)   Pulse 91   Temp 97.7 F (36.5 C) (Oral)   Ht 5\' 2"  (1.575 m)   Wt 117 lb 3.2 oz (53.2 kg)   SpO2 95%   BMI 21.44 kg/m    Physical Examination:   General Appearance: No distress  Neuro:without focal findings,  speech normal,  HEENT: PERRLA, EOM intact.   Pulmonary: normal breath sounds, No wheezing.  CardiovascularNormal S1,S2.  No m/r/g.   Abdomen: Benign, Soft, non-tender. Renal:  No costovertebral tenderness  GU:  Not performed at this time. Endoc: No evident thyromegaly Skin:   warm, no rashes, no ecchymosis  Extremities: normal, no cyanosis,  clubbing. PSYCHIATRIC: Mood, affect within normal limits.   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   Chronic cough seems to be stable at this time No indication for prednisone at this time No indication for antibiotics at this time  COPD Seems to be stable at this time Continue Advair as prescribed Albuterol as needed  Stage IV lung cancer adenocarcinoma Follow-up with oncology And referred to [facility] for clinical trials  Kidney mass status post ablation therapy Follow-up oncology      MEDICATION ADJUSTMENTS/LABS AND TESTS ORDERED: Advair daily Albuterol as needed   CURRENT MEDICATIONS REVIEWED AT LENGTH WITH PATIENT TODAY   Patient satisfied with Plan of action and management. All questions answered  Follow-up 1 year  Total time spent 22  minutes      Carle Dargan Patricia Pesa, M.D.  [facility]  Medical Director [place] Director [facility] Cardio-Pulmonary Department

## 2020-12-22 NOTE — Patient Instructions (Signed)
Continue inhalers as prescribed Albuterol 2 puffs prior to exertion and exercise  Follow-up with oncology

## 2020-12-22 NOTE — Progress Notes (Addendum)
[facility]  [place] [id number]  Operated by [facility]    12/22/2020    Patient Name: Carrie Schmidt    Date of Birth:  [birth year]    Referring Provider:  No referring provider defined for this encounter.    Ophthalmologist/Optometrist:      Chief Complaint     Post-op Eye          HPI     S/P 10/29/20 BUL bleph. Patient states that she is doing well. Vision has improved a lot. No pain. No dryness.     Last edited by Gwendel Hanson on 12/22/2020  2:01 PM. (History)          ROS     Positive for: Eyes    Negative for: Constitutional, Gastrointestinal, Neurological, Skin, Genitourinary, Musculoskeletal, HENT, Endocrine, Cardiovascular, Respiratory, Psychiatric, Allergic/Imm, Heme/Lymph    Last edited by Gwendel Hanson on 12/22/2020  2:01 PM. (History)          History of Sun Exposure  Excessive sun burn:   History radiation to head:  History of skin cancer:        Past Ocular history:   Past Surgical History:   Procedure Laterality Date    BLEPHAROPLASTY BILATERAL Bilateral 10/29/2020    Performed by [name], MD at [facility] Left 08/05/2020    Performed by Aileen Pilot, MD at [facility] Left 08/05/2020    Dr. Noemi Chapel EYELID SURGERY Bilateral 10/29/2020    S/P 10/29/20 BUL bleph    KNEE ARTHROSCOPY Left     SALIVARY GLAND SURGERY           Laci Cecil Cranker 12/22/2020, 14:27    MD Addition to HPI:    The patient is a 70 y.o. female here with complaint of:  POV, S/p BUL bleph (10/29/20). Pt is doing well. She uses ointment. No new concerns. States vision is doing better.  Able to see more since surgery    Assessment     Impression(s):      ICD-10-CM    1. Postsurgical state, eye  Z98.890 OPH EXTERNAL PHOTOS       Plan(s)/Recommendation(s):    S/p BUL bleph (10/29/20)  - healing  well  - rec Vitamin E   - start Tobradex/maxitrol BID x 2 weeks  - RTC PRN    NSC OD/PCIOL OS  - No PCO  - Continue current Rx    Imaging (CT/MRI) Reviewed:   Results Reviewed:       I personally performed the services described in this documentation, as scribed  in my presence, and it is both accurate  and complete.  [name], MD 12/22/2020  14:27    I am scribing for, and in the presence of Dr. Alfonse Spruce for services provided on 12/22/2020.  Hyman Bower, SCRIBE   Hyman Bower, [place]  12/22/2020, 14:24    I saw and examined the patient.  I reviewed the technician/resident's note.  I agree with the findings and plan of care as documented in the resident's note.  Any exceptions/additions are edited/noted.    [name], MD 12/22/2020  14:27      Eye Examination:  Neuro/Psych     Oriented x3: Yes    Mood/Affect: Normal        Visual Acuity     Visual Acuity (Snellen - Linear)       Right Left    Dist sc 20/60 20/20    Dist ph sc 20/20           Edited by: Gwendel Hanson            Not recorded       Not recorded       Not recorded       Confrontational Visual Fields     Visual Fields (Counting fingers)       Right Left     Full Full          Edited by: Gwendel Hanson            Pupils       APD    Right None    Left None        Extraocular Movement     Extraocular Movement       Right Left     Full Full          Edited by: Gwendel Hanson                               Sensation   V1: Intact bilaterally   V2: Intact bilaterally   V3: Intact bilaterally    Facial Contours:     Hertel:                            OD:                            OS:                            Base:    Eyelids:    Bell's:      Lid edema OD: OS:  Conj chem OD:  OS:   Lid inject OD:  OS: Conj inject OD: OS:           3   PF   MRD          3             LF   LC                       USS   LSS                 0   LAG       0     Main Ophthalmology Exam     External Exam       Right Left    External thin brow in proper position,  sitting on brow thin brow in proper position, sitting on brow          Slit Lamp Exam       Right Left    Lids/Lashes well healed well healed    Conjunctiva/Sclera White and quiet White and quiet    Cornea Clear Clear    Anterior Chamber Deep and quiet Deep and quiet    Iris Round and reactive Round and reactive    Lens 1-2+ CC, not in [place]  Posterior chamber intraocular lens    Vitreous Normal Normal                IOP straight Tonometry     Tonometry (Tonopen, 2:12 PM)       Right Left    Pressure 16 13          Edited by: Evonnie Pat     5 down      Past Medical / Surgical / Family / Social History:      has a past medical history of Arthropathy, Cancer (CMS [place]), Heart murmur, HTN (hypertension), Hyperlipidemia, and Wears glasses.    She has no past medical history of Aneurysm (CMS HCC), Angina pectoris (CMS HCC), Asthma, Awareness under anesthesia, BiPAP (biphasic positive airway pressure) dependence, Chronic bronchitis (CMS HCC), Chronic obstructive airway disease (CMS HCC), Clotting disorder (CMS HCC), Congenital anomaly of heart, Congestive heart failure (CMS HCC), Convulsions (CMS HCC), Coronary artery disease, CPAP (continuous positive airway pressure) dependence, CVA (cerebrovascular accident) (CMS HCC), Deep vein thrombosis (DVT) (CMS HCC), Dysrhythmias, Esophageal reflux, H/O hearing loss, H/O urinary tract infection, Hard to intubate, History of anesthesia complications, History of kidney disease, Malignant hyperthermia, MI (myocardial infarction) (CMS HCC), Neck problem, PONV (postoperative nausea and vomiting), Pseudocholinesterase deficiency, Pulmonary embolism (CMS HCC), Shortness of breath, Sleep apnea, Thyroid disorder, Type 2 diabetes mellitus (CMS [place]), Type I diabetes mellitus (CMS [place]), Upper respiratory infection, Viral hepatitis B, or Viral hepatitis C.    Past Surgical History:   Procedure Laterality Date    CESAREAN SECTION      X2    FOOT FRACTURE SURGERY       HX CATARACT REMOVAL Left 08/05/2020    Dr. Noemi Chapel EYELID SURGERY Bilateral 10/29/2020    S/P 10/29/20 BUL bleph    KNEE ARTHROSCOPY Left     SALIVARY GLAND SURGERY             Family Medical History:     Problem Relation (Age of Onset)    Cancer Maternal Grandmother    Cataract Mother, Father    Diabetes Mother            Social History     Tobacco Use    Smoking status: Former Smoker     Quit date: 1972     Years since quitting: 50.5    Smokeless tobacco: Never Used   Substance Use Topics    Alcohol use: Yes     Alcohol/week: 1.0 standard drink     Types: 1 Shots of liquor per week     Comment: Occasional.     Drug use: Never       I have seen and examined the above patient. I discussed the above diagnoses listed in the assessment and the above ophthalmic plan of care with the patient and patient's family. All questions were answered. I reviewed and, when necessary, made changes to the technician/resident note, documented ophthalmology exam, chief complaint, history of present illness, allergies, review of systems, past medical, past surgical, family and social history.    [name], MD 12/22/2020  14:27

## 2020-12-24 ENCOUNTER — Encounter: Payer: Self-pay | Admitting: Radiation Oncology

## 2020-12-24 ENCOUNTER — Inpatient Hospital Stay: Payer: Medicare Other

## 2020-12-24 ENCOUNTER — Encounter: Payer: Self-pay | Admitting: Oncology

## 2020-12-24 ENCOUNTER — Other Ambulatory Visit: Payer: Self-pay

## 2020-12-24 ENCOUNTER — Inpatient Hospital Stay (HOSPITAL_BASED_OUTPATIENT_CLINIC_OR_DEPARTMENT_OTHER): Payer: Medicare Other | Admitting: Oncology

## 2020-12-24 ENCOUNTER — Inpatient Hospital Stay: Payer: Medicare Other | Admitting: Oncology

## 2020-12-24 VITALS — BP 144/92 | HR 101 | Temp 98.9°F | Resp 18 | Ht 62.0 in | Wt 115.1 lb

## 2020-12-24 VITALS — HR 92

## 2020-12-24 DIAGNOSIS — C3490 Malignant neoplasm of unspecified part of unspecified bronchus or lung: Secondary | ICD-10-CM

## 2020-12-24 DIAGNOSIS — Z5111 Encounter for antineoplastic chemotherapy: Secondary | ICD-10-CM | POA: Diagnosis not present

## 2020-12-24 LAB — COMPREHENSIVE METABOLIC PANEL
ALT: 36 U/L (ref 0–44)
AST: 40 U/L (ref 15–41)
Albumin: 3.1 g/dL — ABNORMAL LOW (ref 3.5–5.0)
Alkaline Phosphatase: 144 U/L — ABNORMAL HIGH (ref 38–126)
Anion gap: 8 (ref 5–15)
BUN: 15 mg/dL (ref 8–23)
CO2: 25 mmol/L (ref 22–32)
Calcium: 8.8 mg/dL — ABNORMAL LOW (ref 8.9–10.3)
Chloride: 101 mmol/L (ref 98–111)
Creatinine, Ser: 1.05 mg/dL — ABNORMAL HIGH (ref 0.44–1.00)
GFR, Estimated: 57 mL/min — ABNORMAL LOW (ref >60)
Glucose, Bld: 105 mg/dL — ABNORMAL HIGH (ref 70–99)
Potassium: 4.2 mmol/L (ref 3.5–5.1)
Sodium: 134 mmol/L — ABNORMAL LOW (ref 135–145)
Total Bilirubin: 0.5 mg/dL (ref 0.3–1.2)
Total Protein: 7.1 g/dL (ref 6.5–8.1)

## 2020-12-24 LAB — CBC WITH DIFFERENTIAL/PLATELET
Abs Immature Granulocytes: 0.01 10*3/uL (ref 0.00–0.07)
Basophils Absolute: 0 10*3/uL (ref 0.0–0.1)
Basophils Relative: 1 %
Eosinophils Absolute: 0.1 10*3/uL (ref 0.0–0.5)
Eosinophils Relative: 5 %
HCT: 26.5 % — ABNORMAL LOW (ref 36.0–46.0)
Hemoglobin: 8.6 g/dL — ABNORMAL LOW (ref 12.0–15.0)
Immature Granulocytes: 1 %
Lymphocytes Relative: 31 %
Lymphs Abs: 0.5 10*3/uL — ABNORMAL LOW (ref 0.7–4.0)
MCH: 28.1 pg (ref 26.0–34.0)
MCHC: 32.5 g/dL (ref 30.0–36.0)
MCV: 86.6 fL (ref 80.0–100.0)
Monocytes Absolute: 0.3 10*3/uL (ref 0.1–1.0)
Monocytes Relative: 18 %
Neutro Abs: 0.7 10*3/uL — ABNORMAL LOW (ref 1.7–7.7)
Neutrophils Relative %: 44 %
Platelets: 228 10*3/uL (ref 150–400)
RBC: 3.06 MIL/uL — ABNORMAL LOW (ref 3.87–5.11)
RDW: 21.2 % — ABNORMAL HIGH (ref 11.5–15.5)
Smear Review: NORMAL
WBC: 1.5 10*3/uL — ABNORMAL LOW (ref 4.0–10.5)
nRBC: 0 % (ref 0.0–0.2)

## 2020-12-24 LAB — PHOSPHORUS: Phosphorus: 4 mg/dL (ref 2.5–4.6)

## 2020-12-24 LAB — MAGNESIUM: Magnesium: 2 mg/dL (ref 1.7–2.4)

## 2020-12-24 MED ORDER — SODIUM CHLORIDE 0.9 % IV SOLN
Freq: Once | INTRAVENOUS | Status: AC
  Filled 2020-12-24: qty 250

## 2020-12-24 MED ORDER — HEPARIN SOD (PORK) LOCK FLUSH 100 UNIT/ML IV SOLN
INTRAVENOUS | Status: AC
  Filled 2020-12-24: qty 5

## 2020-12-24 MED ORDER — PROCHLORPERAZINE MALEATE 10 MG PO TABS
10.0000 mg | ORAL_TABLET | Freq: Once | ORAL | Status: AC
  Administered 2020-12-24: 10 mg via ORAL
  Filled 2020-12-24: qty 1

## 2020-12-24 MED ORDER — SODIUM CHLORIDE 0.9 % IV SOLN
1600.0000 mg | Freq: Once | INTRAVENOUS | Status: AC
  Administered 2020-12-24: 1600 mg via INTRAVENOUS
  Filled 2020-12-24: qty 26.3

## 2020-12-24 MED ORDER — HEPARIN SOD (PORK) LOCK FLUSH 100 UNIT/ML IV SOLN
500.0000 [IU] | Freq: Once | INTRAVENOUS | Status: AC | PRN
Start: 2020-12-24 — End: 2020-12-24
  Administered 2020-12-24: 500 [IU]
  Filled 2020-12-24: qty 5

## 2020-12-24 NOTE — Progress Notes (Signed)
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  INTERVAL HISTORY: Patient returns to clinic today for further evaluation and consideration of cycle 4, day 8 of single agent gemcitabine.  She continues to have chronic weakness and fatigue, but otherwise is tolerating her treatments well.  She has no neurologic complaints.  She denies any recent fevers or illnesses.  She has a fair appetite and denies weight loss.  She denies any chest pain, shortness of breath, cough, or hemoptysis.  She denies any nausea, vomiting, constipation, or diarrhea.  She has no urinary complaints.  Patient offers no further specific complaints today.  REVIEW OF SYSTEMS:   Review of Systems  Constitutional:  Positive for malaise/fatigue. Negative for fever and weight loss.  Respiratory: Negative.  Negative for cough, hemoptysis and shortness of breath.   Cardiovascular: Negative.  Negative for chest pain and leg swelling.  Gastrointestinal: Negative.  Negative for abdominal pain, constipation, diarrhea and nausea.  Genitourinary: Negative.  Negative for dysuria and flank pain.  Musculoskeletal:  Negative for back pain and joint pain.  Skin: Negative.  Negative for rash.  Neurological:  Positive for weakness. Negative for dizziness, sensory change, focal weakness and headaches.  Psychiatric/Behavioral: Negative.  The patient is not nervous/anxious.    As per HPI. Otherwise, a complete review of systems is negative.  PAST MEDICAL HISTORY: Past Medical History:  Diagnosis Date   Abnormal echocardiogram    Acid reflux    Allergic rhinitis    Anemia    HAD TO HAVE IRON INFUSIONS BACK IN 2015   Arthritis    bil  hands and back   Asthma    Bloody discharge from left nipple    Cancer of right lung ([place]) 2015   lung cancer - chemo, Dr Genevive Bi removed RML Lobectomy.    Colon polyps    COPD (chronic obstructive pulmonary disease) (HCC)    Dyspnea    WITH EXERTION   Family history of breast cancer    Family history of cervical cancer    Family history of colon cancer    Family history of skin cancer    Hypertension    Malignant neoplasm of middle lobe of right lung ([place])    Mitral valve prolapse    Mitral valve prolapse    Non-small cell lung cancer (HCC)    Personal history of chemotherapy    F/U Lung Cancer   Personal history of radiation therapy    Lung cancer   Primary cancer of right middle lobe of lung ([place]) 03/11/2016   Squamous cell carcinoma of skin 07/07/2009   right supraclavicular/EDC   Squamous cell carcinoma of skin 11/04/2019   Left pretibial. [facility].   Stage IV adenocarcinoma of lung, unspecified laterality ([place])     PAST SURGICAL HISTORY: Past Surgical History:  Procedure Laterality Date   ABDOMINAL HYSTERECTOMY     BAND HEMORRHOIDECTOMY     BREAST BIOPSY Left 03/01/2019   Procedure: LEFT BREAST EXCISIONAL BIOPSY WITH NEEDLE LOCALIZATION;  Surgeon: Herbert Pun, MD;  Location: [facility] ORS;  Service: General;  Laterality: Left;   BREAST EXCISIONAL BIOPSY Left 1994 (-), 2004 (-)   benign   BREAST EXCISIONAL BIOPSY Left 03/01/2019   - INTRADUCTAL PAPILLOMA WITH  SCLEROSIS.    COLONOSCOPY  2008    Dr. Donnella Sham   COLONOSCOPY WITH PROPOFOL N/A 04/12/2019   Procedure: COLONOSCOPY WITH PROPOFOL;  Surgeon: Lollie Sails, MD;  [facility]: [facility] ENDOSCOPY;  Service: Endoscopy;  Laterality: N/A;   IR PORT REPAIR CENTRAL VENOUS ACCESS DEVICE  11/13/2020   IR RADIOLOGIST EVAL & MGMT  08/28/2019   IR RADIOLOGIST EVAL & MGMT  10/30/2019   IR RADIOLOGIST EVAL & MGMT  11/26/2019   KNEE SURGERY  2007   LUNG LOBECTOMY Right    middle lobe   PORTACATH PLACEMENT Right 02/09/2017    Procedure: INSERTION PORT-A-CATH;  Surgeon: Nestor Lewandowsky, MD;  Location: [facility] ORS;  Service: General;  Laterality: Right;   RADIOLOGY WITH ANESTHESIA N/A 10/02/2019   Procedure: MICROWAVE THERMA ABLATION;  Surgeon: Aletta Edouard, MD;  Location: [facility] ORS;  Service: Radiology;  Laterality: N/A;   SALIVARY GLAND SURGERY Left 1983   with lymph node removal also   SALIVARY GLAND SURGERY     salivary gland removal    UPPER GI ENDOSCOPY  2008   VIDEO BRONCHOSCOPY WITH ENDOBRONCHIAL ULTRASOUND Right 01/24/2017   Procedure: VIDEO BRONCHOSCOPY WITH ENDOBRONCHIAL ULTRASOUND;  Surgeon: [name], MD;  Location: [facility] ORS;  Service: Pulmonary;  Laterality: Right;    FAMILY HISTORY: Family History  Problem Relation Age of Onset   Breast cancer Maternal Aunt 61   Alzheimer's disease Maternal Aunt    Breast cancer Paternal Aunt        dx. in her late 14s   Breast cancer Maternal Grandmother        dx. in her 40s   Cervical cancer Maternal Grandmother 60   Colon cancer Maternal Grandmother        dx. in her 41s   Breast cancer Maternal Aunt 60   Alzheimer's disease Maternal Aunt    Breast cancer Paternal Aunt        dx. in her late 39s   Heart disease Paternal 75    Healthy Mother    Healthy Father    Colon cancer Father 34   Skin cancer Father    Heart attack Father    Heart attack Paternal Grandfather    Skin cancer Paternal Aunt    Heart attack Paternal Uncle 40   Heart attack Paternal Uncle     ADVANCED DIRECTIVES (Y/N):  N  HEALTH MAINTENANCE: Social History   Tobacco Use   Smoking status: Never   Smokeless tobacco: Never  Vaping Use   Vaping Use: Never used  Substance Use Topics   Alcohol use: Yes    Alcohol/week: 7.0 standard drinks    Types: 7 Glasses of wine per week   Drug use: No     Colonoscopy:  PAP:  Bone density:  Lipid panel:  Allergies  Allergen Reactions   Other Other (See Comments)    Cat gut sutures get infected Cat gut sutures get  infected   Penicillins Swelling and Rash    Did it involve swelling of the face/tongue/throat, SOB, or low BP? Unknown Did it involve sudden or severe rash/hives, skin peeling, or any reaction on the inside of your mouth or nose? No Did you need to seek medical attention at a hospital or doctor's office? Yes When did it last happen?      30 + years  If all above answers are "NO", may proceed with cephalosporin use.     Sulfa Antibiotics Nausea And Vomiting    Current Outpatient Medications  Medication  Sig Dispense Refill   albuterol (VENTOLIN HFA) 108 (90 Base) MCG/ACT inhaler Inhale 1-2 puffs into the lungs every 6 (six) hours as needed for wheezing or shortness of breath. 1 g 10   ALPRAZolam (XANAX) 0.25 MG tablet Take 1 tablet (0.25 mg total) by mouth at bedtime as needed for anxiety. 30 tablet 0   ALPRAZolam (XANAX) 0.5 MG tablet take 1-2 tablets by mouth at night as needed for sleep 60 tablet 3   ascorbic acid (VITAMIN C) 100 MG tablet Take 100 mg by mouth daily.      B Complex Vitamins (VITAMIN B COMPLEX PO) Take 1 tablet by mouth daily.      Biotin w/ Vitamins C & E (HAIR/SKIN/NAILS PO) Take 1 tablet by mouth daily.     Calcium Carb-Cholecalciferol 600-800 MG-UNIT TABS Take 2 tablets by mouth daily.     Cholecalciferol 125 MCG (5000 UT) capsule Take 5,000 Units by mouth daily.     clobetasol ointment (TEMOVATE) 4.62 % Apply 1 application topically 2 (two) times daily. Dry area and apply to mouth sores twice daily as needed for up to 2 weeks. 15 g 0   COLLAGEN PO Take 1,000 mg by mouth daily.     cyclobenzaprine (FLEXERIL) 5 MG tablet Take 1 tablet (5 mg total) by mouth 3 (three) times daily as needed for muscle spasms. 30 tablet 0   fexofenadine (ALLEGRA) 180 MG tablet Take 180 mg by mouth daily as needed for allergies.      fluticasone (FLONASE) 50 MCG/ACT nasal spray Place 2 sprays into both nostrils daily as needed for allergies or rhinitis.     fluticasone-salmeterol (ADVAIR)  250-50 MCG/ACT AEPB Inhale 1 puff into the lungs 2 (two) times daily as needed for breathing issues. 60 each 8   Fluticasone-Salmeterol (ADVAIR) 250-50 MCG/DOSE AEPB INHALE 1 PUFF BY MOUTH 2 TIMES DAILY AS NEEDED FOR BREATHING ISSUES 60 each 10   ibuprofen (ADVIL) 800 MG tablet TAKE 1 TABLET BY MOUTH EVERY 8 HOURS AS NEEDED FOR PAIN **ALWAYS TAKE WITH FOOD** 90 tablet 2   lansoprazole (PREVACID) 30 MG capsule TAKE 1 CAPSULE BY MOUTH TWICE DAILY 180 capsule 3   lidocaine-prilocaine (EMLA) cream Apply 1 application topically as needed. Apply generously over the Mediport 45 minutes prior to chemotherapy. 30 g 0   magic mouthwash SOLN Take 5-10 mLs by mouth 4 (four) times daily as needed for mouth pain. 480 mL 0   metoprolol succinate (TOPROL-XL) 25 MG 24 hr tablet Take 0.5 tablets (12.5 mg total) by mouth once daily 15 tablet 6   ondansetron (ZOFRAN) 8 MG tablet Take 1 tablet (8 mg total) by mouth every 8 (eight) hours as needed for nausea or vomiting (start 3 days; after chemo). 40 tablet 1   osimertinib mesylate (TAGRISSO) 80 MG tablet Take 80 mg by mouth daily.     polyethylene glycol (MIRALAX / GLYCOLAX) packet Take 17 g by mouth daily as needed for mild constipation.     prochlorperazine (COMPAZINE) 10 MG tablet Take 1 tablet (10 mg total) by mouth every 6 (six) hours as needed for nausea or vomiting. 60 tablet 2   triamcinolone (KENALOG) 0.1 % paste Use as directed 1 application in the mouth or throat 2 (two) times daily. 15 g 12   Vitamin D, Ergocalciferol, (DRISDOL) 1.25 MG (50000 UNIT) CAPS capsule TAKE 1 CAPSULE (50,000 UNITS TOTAL) BY MOUTH ONCE A WEEK 12 capsule 3   zinc gluconate 50 MG tablet Take 50 mg by  mouth daily.     hydrocortisone (ANUSOL-HC) 25 MG suppository Place 1 suppository (25 mg total) rectally 2 (two) times daily for 10 days 20 suppository 0   No current facility-administered medications for this visit.   Facility-Administered Medications Ordered in Other Visits   Medication Dose Route Frequency Provider Last Rate Last Admin   heparin lock flush 100 unit/mL  500 Units Intravenous Once Grayland Ormond, Kathlene November, MD       sodium chloride flush (NS) 0.9 % injection 10 mL  10 mL Intravenous PRN Lloyd Huger, MD       sodium chloride flush (NS) 0.9 % injection 10 mL  10 mL Intravenous PRN Lloyd Huger, MD   10 mL at 01/22/18 1419    OBJECTIVE: Vitals:   12/24/20 1101  BP: (!) 144/92  Pulse: (!) 101  Resp: 18  Temp: 98.9 F (37.2 C)  SpO2: 96%     Body mass index is 21.05 kg/m.    ECOG FS:1 - Symptomatic but completely ambulatory  General: Well-developed, well-nourished, no acute distress. Eyes: Pink conjunctiva, anicteric sclera. HEENT: Normocephalic, moist mucous membranes. Lungs: No audible wheezing or coughing. Heart: Regular rate and rhythm. Abdomen: Soft, nontender, no obvious distention. Musculoskeletal: No edema, cyanosis, or clubbing. Neuro: Alert, answering all questions appropriately. Cranial nerves grossly intact. Skin: No rashes or petechiae noted. Psych: Normal affect.   LAB RESULTS:  Lab Results  Component Value Date   NA 134 (L) 12/24/2020   K 4.2 12/24/2020   CL 101 12/24/2020   CO2 25 12/24/2020   GLUCOSE 105 (H) 12/24/2020   BUN 15 12/24/2020   CREATININE 1.05 (H) 12/24/2020   CALCIUM 8.8 (L) 12/24/2020   PROT 7.1 12/24/2020   ALBUMIN 3.1 (L) 12/24/2020   AST 40 12/24/2020   ALT 36 12/24/2020   ALKPHOS 144 (H) 12/24/2020   BILITOT 0.5 12/24/2020   GFRNONAA 57 (L) 12/24/2020   GFRAA >60 03/23/2020    Lab Results  Component Value Date   WBC 1.5 (L) 12/24/2020   NEUTROABS 0.7 (L) 12/24/2020   HGB 8.6 (L) 12/24/2020   HCT 26.5 (L) 12/24/2020   MCV 86.6 12/24/2020   PLT 228 12/24/2020     STUDIES: MR Brain W Wo Contrast  Result Date: 12/16/2020 CLINICAL DATA:  Lung cancer.  Assess for metastatic disease. EXAM: MRI HEAD WITHOUT AND WITH CONTRAST TECHNIQUE: Multiplanar, multiecho pulse sequences  of the brain and surrounding structures were obtained without and with intravenous contrast. CONTRAST:  45m GADAVIST GADOBUTROL 1 MMOL/ML IV SOLN COMPARISON:  10/05/2020 FINDINGS: Brain: Diffusion imaging does not show any acute or subacute infarction or other cause of restricted diffusion. The brain shows mild age related atrophy without evidence of prior small or large vessel infarction. The brainstem and cerebellum are normal. Cerebral hemispheres show a dilated perivascular space in the external capsule on the left. No metastatic disease seen in the cerebellum. There is an insignificant venous angioma on the right. There is a 2 mm metastasis in the right posterior inferior temporal lobe, axial image 58. There is a 2 x 3 mm surface metastasis along the gyral surface of a right posterior frontal gyrus axial image 115. Second surface metastasis along the gyral surface in the right parietal region axial image 115. 2 mm metastasis along the gyral surface of a medial right frontal gyrus axial image 118. Subtle punctate focus of enhancement along the gyral surface in the left parietal lobe axial image 126, actually better seen on the prior  exam. Vascular: Major vessels at the base of the brain show flow. Skull and upper cervical spine: Negative Sinuses/Orbits: Clear/normal Other: None IMPRESSION: Four tiny metastases noted in the right hemisphere as outlined above. One tiny metastasis in the left hemisphere, less conspicuous than on the study of 10/05/2020. No newly seen lesion. Electronically Signed   By: Nelson Chimes M.D.   On: 12/16/2020 19:21   CT CHEST ABDOMEN PELVIS W CONTRAST  Result Date: 12/17/2020 CLINICAL DATA:  Non-small cell lung cancer, status post right middle lobectomy and radiation therapy with metastatic recurrence EXAM: CT CHEST, ABDOMEN, AND PELVIS WITH CONTRAST TECHNIQUE: Multidetector CT imaging of the chest, abdomen and pelvis was performed following the standard protocol during bolus  administration of intravenous contrast. CONTRAST:  46m OMNIPAQUE IOHEXOL 300 MG/ML SOLN, additional oral enteric contrast COMPARISON:  Outside CT chest abdomen pelvis, 10/05/2020 FINDINGS: CT CHEST FINDINGS Cardiovascular: Right chest port catheter. Scattered aortic atherosclerosis. Normal heart size. No pericardial effusion. Mediastinum/Nodes: Unchanged post treatment appearance of ill-defined soft tissue about the right hilum and mediastinum, without discretely enlarged mediastinal lymph nodes. There are bulky left supraclavicular and cervical lymph nodes, largest measuring 3.1 x 2.2 cm, unchanged (series 2, image 7). Thyroid gland, trachea, and esophagus demonstrate no significant findings. Lungs/Pleura: Unchanged appearance of the right hemithorax, with extensive soft tissue and post treatment fibrosis about the right hilum, diffuse interlobular septal thickening and heterogeneous airspace opacity, as well as diffuse right-sided pleural thickening and a small, loculated right pleural effusion. There are multiple small pulmonary nodules throughout the lungs, not significantly changed, an index nodule of the left upper lobe measuring 4 mm (series 3, image 56) and an index nodule of the medial left lung base measuring 5 mm (series 3, image 117). Musculoskeletal: No chest wall mass or suspicious bone lesions identified. CT ABDOMEN PELVIS FINDINGS Hepatobiliary: Numerous low-attenuation lesions throughout the liver are increased in size compared to prior examination, an index lesion of the left lobe of the liver measuring 3.1 x 2.6 cm, previously 2.2 x 1.9 cm (series 2, image 56). An index lesion of the peripheral liver dome measures 2.6 x 2.6 cm, previously 2.4 x 2.1 cm (series 2, image 52). No gallstones, gallbladder wall thickening, or biliary dilatation. Pancreas: Unremarkable. No pancreatic ductal dilatation or surrounding inflammatory changes. Spleen: Normal in size without significant abnormality.  Adrenals/Urinary Tract: Adrenal glands are unremarkable. There is a large exophytic mass of the lateral midportion of the left kidney, slightly increased in size, measuring 5.4 x 3.5 cm, previously 4.7 x 3.3 cm (series 2, image 71). Bladder is unremarkable. Stomach/Bowel: Stomach is within normal limits. Appendix appears normal. No evidence of bowel wall thickening, distention, or inflammatory changes. Vascular/Lymphatic: No significant vascular findings are present. Redemonstrated bulky retroperitoneal lymphadenopathy, largest node conglomerate in the left retroperitoneum measuring 4.3 x 3.7 cm, not significantly changed (series 2, image 70). Reproductive: Status post hysterectomy. Other: No abdominal wall hernia or abnormality. No abdominopelvic ascites. Musculoskeletal: No acute or significant osseous findings. IMPRESSION: 1. Unchanged post treatment appearance of the right hemithorax, with extensive soft tissue and post treatment fibrosis about the right hilum, diffuse interlobular septal thickening and heterogeneous airspace opacity, as well as diffuse right-sided pleural thickening and a small, loculated right pleural effusion. Findings are consistent with advanced lung malignancy including pleural and lymphangitic disease. 2. There are multiple small pulmonary nodules throughout the lungs, not significantly changed, consistent with pulmonary metastatic disease. 3. Numerous low-attenuation lesions throughout the liver are increased in size compared to  prior examination, findings consistent with worsened hepatic metastatic disease. 4. Large exophytic mass of the lateral midportion of the left kidney, increased in size, consistent with worsened metastasis. 5. Redemonstrated bulky retroperitoneal lymphadenopathy, not significantly changed. 6. Overall constellation of findings with significant evidence of metastatic disease progression in the abdomen and pelvis, however findings in the chest are stable. Aortic  Atherosclerosis (ICD10-I70.0). Electronically Signed   By: Eddie Candle M.D.   On: 12/17/2020 09:21     ONCOLOGY HISTORY: Patient initially underwent resection in May 2015 and was noted to have the EGFR mutation.  She initially received 4 cycles of adjuvant cisplatin and Alimta completing on January 23, 2014.  Patient was on the [facility] protocol for maintenance Tarceva versus placebo, but discontinued treatment in November 2015 because of significant side effects.  Although blinded, presumption was she was receiving Tarceva.  She recently was noted to have a recurrence confirmed by right paratracheal lymph node biopsy on January 24, 2017.  PET scan on February 03, 2017 did not reveal any distant metastasis.  She underwent treatment with weekly carboplatinum and Taxol along with daily XRT finishing in October 2018.  She initiated maintenance durvalumab on June 29, 2017.  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  Patient received Tagrisso from April 2019 through September 2021. PET scan results from July 09, 2019 revealed mild progression of disease in her lungs with an isolated left kidney metastasis that has undergone ablation. PET scan on March 11, 2020 revealed continued progression of disease in her lungs.    She subsequently discontinued Tagrisso and initiated single agent pemetrexed on March 23, 2020.    Patient enrolled in clinical trial [facility] between December 2021 and April 2022.  She initiated single agent gemcitabine on October 15, 2020.   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   PLAN:    1. 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  Patient's most recent imaging on December 16, 2020 reviewed independently and reported as above with multiple pulmonary nodules throughout both lungs not significantly changed, but numerous lesions throughout liver are  increased in size consistent with worsening hepatic disease.  Retroperitoneal adenopathy is not significantly changed.  Patient essentially has an equivocal response to her treatment and given her limited treatment options, will continue with single agent gemcitabine.  MRI of the brain on December 16, 2020 reviewed independently and reported as above with 4 lesions noted.  Patient initially wished for monitoring, then requested evaluation by radiation oncology.  Proceed with cycle 4, day 8 of treatment despite neutropenia.  Return to clinic in 2 weeks for further evaluation and consideration of cycle 5, day 1.   2.  Diarrhea: Resolved.  Continue Lomotil as needed. 3.  Cough: Patient does not complain of this today.  Monitor.   4.  Breast lesion: Benign papilloma.  Patient underwent lumpectomy on March 01, 2019.  Her most recent mammogram on March 03, 2020 was reported as BI-RADS 2.   5.  Anemia: Chronic and unchanged.  Patient's hemoglobin is 8.6 today. 6.  Renal insufficiency: Resolved. 7.  Neutropenia: Patient's ANC is 0.7 today.  Proceed cautiously with treatment as above.  Patient may require Neulasta or bio similar in the near future. 8.  Nausea: Patient does not complain of this today.  Continue Compazine as needed. 9.  Brain metastasis: Treatment per radiation oncology.  Patient expressed  understanding and was in agreement with this plan. She also understands that She can call clinic at any time with any questions, concerns, or complaints.    Lloyd Huger, MD   12/26/2020 2:12 PM

## 2020-12-24 NOTE — Progress Notes (Signed)
Labs reviewed with MD and treatment team. Per MD to proceed with treatment. Treatment team updated.   Millie Shorb [employer]

## 2020-12-24 NOTE — Patient Instructions (Signed)
[facility]  Discharge Instructions: Thank you for choosing [facility] to provide your oncology and hematology care.  If you have a lab appointment with the Cancer Center, please go directly to the Cancer Center and check in at the registration area.  Wear comfortable clothing and clothing appropriate for easy access to any Portacath or PICC line.   We strive to give you quality time with your provider. You may need to reschedule your appointment if you arrive late (15 or more minutes).  Arriving late affects you and other patients whose appointments are after yours.  Also, if you miss three or more appointments without notifying the office, you may be dismissed from the clinic at the provider's discretion.      For prescription refill requests, have your pharmacy contact our office and allow 72 hours for refills to be completed.    Today you received the following chemotherapy and/or immunotherapy agents Gemzar   To help prevent nausea and vomiting after your treatment, we encourage you to take your nausea medication as directed.  BELOW ARE SYMPTOMS THAT SHOULD BE REPORTED IMMEDIATELY: *FEVER GREATER THAN 100.4 F (38 C) OR HIGHER *CHILLS OR SWEATING *NAUSEA AND VOMITING THAT IS NOT CONTROLLED WITH YOUR NAUSEA MEDICATION *UNUSUAL SHORTNESS OF BREATH *UNUSUAL BRUISING OR BLEEDING *URINARY PROBLEMS (pain or burning when urinating, or frequent urination) *BOWEL PROBLEMS (unusual diarrhea, constipation, pain near the anus) TENDERNESS IN MOUTH AND THROAT WITH OR WITHOUT PRESENCE OF ULCERS (sore throat, sores in mouth, or a toothache) UNUSUAL RASH, SWELLING OR PAIN  UNUSUAL VAGINAL DISCHARGE OR ITCHING   Items with * indicate a potential emergency and should be followed up as soon as possible or go to the Emergency Department if any problems should occur.  Please show the CHEMOTHERAPY ALERT CARD or IMMUNOTHERAPY ALERT CARD at check-in to the  Emergency Department and triage nurse.  Should you have questions after your visit or need to cancel or reschedule your appointment, please contact [facility]  [phone] and follow the prompts.  Office hours are 8:00 a.m. to 4:30 p.m. Monday - Friday. Please note that voicemails left after 4:00 p.m. may not be returned until the following business day.  We are closed weekends and major holidays. You have access to a nurse at all times for urgent questions. Please call the main number to the clinic [phone] and follow the prompts.  For any non-urgent questions, you may also contact your provider using MyChart. We now offer e-Visits for anyone 18 and older to request care online for non-urgent symptoms. For details visit [email or web address].   Also download the MyChart app! Go to the app store, search "MyChart", open the app, select [facility], and log in with your MyChart username and password.  Due to Covid, a mask is required upon entering the hospital/clinic. If you do not have a mask, one will be given to you upon arrival. For doctor visits, patients may have 1 support person aged 18 or older with them. For treatment visits, patients cannot have anyone with them due to current Covid guidelines and our immunocompromised population.  

## 2020-12-25 ENCOUNTER — Other Ambulatory Visit: Payer: Self-pay

## 2020-12-25 ENCOUNTER — Ambulatory Visit
Admission: RE | Admit: 2020-12-25 | Discharge: 2020-12-25 | Disposition: A | Discharge status: Home / Self Care | Payer: Medicare Other | Source: Ambulatory Visit | Attending: Radiation Oncology | Admitting: Radiation Oncology

## 2020-12-25 ENCOUNTER — Encounter: Payer: Self-pay | Admitting: Radiation Oncology

## 2020-12-25 ENCOUNTER — Encounter: Payer: Self-pay | Admitting: Oncology

## 2020-12-25 VITALS — BP 117/88 | HR 116 | Temp 98.0°F | Resp 16 | Wt 114.3 lb

## 2020-12-25 DIAGNOSIS — C7931 Secondary malignant neoplasm of brain: Secondary | ICD-10-CM | POA: Insufficient documentation

## 2020-12-25 DIAGNOSIS — C7902 Secondary malignant neoplasm of left kidney and renal pelvis: Secondary | ICD-10-CM | POA: Diagnosis not present

## 2020-12-25 DIAGNOSIS — C342 Malignant neoplasm of middle lobe, bronchus or lung: Secondary | ICD-10-CM | POA: Diagnosis not present

## 2020-12-25 DIAGNOSIS — Z9221 Personal history of antineoplastic chemotherapy: Secondary | ICD-10-CM | POA: Diagnosis not present

## 2020-12-25 DIAGNOSIS — Z923 Personal history of irradiation: Secondary | ICD-10-CM | POA: Insufficient documentation

## 2020-12-25 DIAGNOSIS — C787 Secondary malignant neoplasm of liver and intrahepatic bile duct: Secondary | ICD-10-CM | POA: Insufficient documentation

## 2020-12-25 MED ORDER — HYDROCORTISONE ACETATE 25 MG RE SUPP
RECTAL | 0 refills | Status: AC
  Filled 2020-12-25: qty 20, 10d supply, fill #0

## 2020-12-25 NOTE — Progress Notes (Signed)
Radiation Oncology Follow up Note OP N/A old patient new area of brain metastasis  Name: Lauren Page   Date:   12/25/2020 MRN:  [id number] DOB: June 27, 1951    This 70 y.o. 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  She developed a right paratracheal lymph node recurrence biopsy positive in 2018 underwent carboplatinum and Taxol along with radiation therapy completed in October 2018.  She has been treated with multiple immunotherapy agents currently under [facility] direction she is on single agent gemcitabine.  She has had noted brain metastasis although is self-referred today because of persistent daily headaches.  She is having no change in visual fields.  Most recent MRI scan of her brain shows multiple metastasis in the brain at least 5.  Upon review of the reports from her MRI scan at [facility] back in April this seems to be some progression.  She specifically denies any change in visual fields or any focal neurologic deficits.  Recent CT scan of the chest shows fairly unchanged disease although there are multiple small pulmonary nodules throughout the lungs.  She also has renal and liver metastasis.  COMPLICATIONS OF TREATMENT: none  FOLLOW UP COMPLIANCE: keeps appointments   PHYSICAL EXAM:  BP 117/88 (BP Location: Left Arm, Patient Position: Sitting)   Pulse (!) 116   Temp 98 F (36.7 C) (Tympanic)   Resp 16   Wt 114 lb 4.8 oz (51.8 kg)   BMI 20.91 kg/m  Thin frail female in NAD.  Crude visual fields are within normal range motor or sensory and DTR levels are equal symmetric upper lower extremities.  Well-developed well-nourished patient in NAD. HEENT reveals PERLA, EOMI, discs not visualized.  Oral cavity is  clear. No oral mucosal lesions are identified. Neck is clear without evidence of cervical or supraclavicular adenopathy. Lungs are clear to A&P. Cardiac examination is essentially unremarkable with regular rate and rhythm without murmur rub or thrill. Abdomen is benign with no organomegaly or masses noted. Motor sensory and DTR levels are equal and symmetric in the upper and lower extremities. Cranial nerves II through XII are grossly intact. Proprioception is intact. No peripheral adenopathy or edema is identified. No motor or sensory levels are noted. Crude visual fields are within normal range.  RADIOLOGY RESULTS: CT scans chest abdomen pelvis as well as MRI of brain reviewed compatible with above-stated findings  PLAN: At this time although these lesions were smaller brain there are numerous and obviously causing symptoms of persistent headaches.  I have gone over the risks and benefits of radiation therapy including hair loss fatigue and cognitive decline with both the patient and her husband.  They have excepted treatment.  We will plan on delivering 30 Gray in 10 fractions.  I have personally set up and ordered CT simulation.  I believe with single agent gemcitabine she should have no other significant side effects.  We I discussed the case with Dr. Grayland Ormond.  I would like to take this opportunity to thank you for allowing me to participate in the care of your patient.Noreene Filbert, MD

## 2020-12-26 ENCOUNTER — Encounter: Payer: Self-pay | Admitting: Oncology

## 2020-12-28 ENCOUNTER — Other Ambulatory Visit: Payer: Self-pay | Admitting: Interventional Radiology

## 2020-12-28 DIAGNOSIS — C3490 Malignant neoplasm of unspecified part of unspecified bronchus or lung: Secondary | ICD-10-CM

## 2020-12-29 ENCOUNTER — Ambulatory Visit: Admission: RE | Admit: 2020-12-29 | Payer: Medicare Other | Source: Ambulatory Visit

## 2020-12-29 DIAGNOSIS — C342 Malignant neoplasm of middle lobe, bronchus or lung: Secondary | ICD-10-CM | POA: Insufficient documentation

## 2020-12-30 ENCOUNTER — Other Ambulatory Visit: Payer: Self-pay | Admitting: *Deleted

## 2020-12-30 ENCOUNTER — Other Ambulatory Visit: Payer: Self-pay

## 2020-12-30 MED ORDER — DEXAMETHASONE 4 MG PO TABS
4.0000 mg | ORAL_TABLET | Freq: Two times a day (BID) | ORAL | 0 refills | Status: AC
  Filled 2020-12-30: qty 30, 15d supply, fill #0

## 2020-12-31 ENCOUNTER — Other Ambulatory Visit: Payer: Self-pay

## 2021-01-05 ENCOUNTER — Ambulatory Visit: Payer: Medicare Other

## 2021-01-06 ENCOUNTER — Ambulatory Visit: Admission: RE | Admit: 2021-01-06 | Payer: Medicare Other | Source: Ambulatory Visit

## 2021-01-06 ENCOUNTER — Ambulatory Visit: Payer: Medicare Other

## 2021-01-06 DIAGNOSIS — C342 Malignant neoplasm of middle lobe, bronchus or lung: Secondary | ICD-10-CM | POA: Diagnosis present

## 2021-01-06 DIAGNOSIS — Z510 Encounter for antineoplastic radiation therapy: Secondary | ICD-10-CM | POA: Insufficient documentation

## 2021-01-06 DIAGNOSIS — C7931 Secondary malignant neoplasm of brain: Secondary | ICD-10-CM | POA: Diagnosis present

## 2021-01-07 ENCOUNTER — Other Ambulatory Visit: Payer: Medicare Other

## 2021-01-07 ENCOUNTER — Other Ambulatory Visit: Payer: Self-pay

## 2021-01-07 ENCOUNTER — Ambulatory Visit
Admission: RE | Admit: 2021-01-07 | Discharge: 2021-01-07 | Disposition: A | Discharge status: Home / Self Care | Payer: Medicare Other | Source: Ambulatory Visit | Attending: Oncology | Admitting: Oncology

## 2021-01-07 ENCOUNTER — Ambulatory Visit: Payer: Medicare Other | Admitting: Oncology

## 2021-01-07 ENCOUNTER — Ambulatory Visit: Payer: Medicare Other

## 2021-01-07 DIAGNOSIS — Z510 Encounter for antineoplastic radiation therapy: Secondary | ICD-10-CM | POA: Diagnosis not present

## 2021-01-07 MED ORDER — METOPROLOL SUCCINATE ER 25 MG PO TB24
25.0000 mg | ORAL_TABLET | Freq: Every day | ORAL | 6 refills | Status: AC
  Filled 2021-01-07: qty 30, 30d supply, fill #0
  Filled 2021-01-27: qty 30, 30d supply, fill #1

## 2021-01-07 MED FILL — Lansoprazole Cap Delayed Release 30 MG: ORAL | 90 days supply | Qty: 180 | Fill #0 | Status: AC

## 2021-01-08 ENCOUNTER — Other Ambulatory Visit: Payer: Self-pay

## 2021-01-08 ENCOUNTER — Inpatient Hospital Stay: Payer: Medicare Other

## 2021-01-08 ENCOUNTER — Inpatient Hospital Stay: Payer: Medicare Other | Attending: Oncology | Admitting: Oncology

## 2021-01-08 ENCOUNTER — Ambulatory Visit
Admission: RE | Admit: 2021-01-08 | Discharge: 2021-01-08 | Disposition: A | Discharge status: Home / Self Care | Payer: Medicare Other | Source: Ambulatory Visit | Attending: Radiation Oncology | Admitting: Radiation Oncology

## 2021-01-08 VITALS — BP 118/85 | HR 96 | Temp 97.6°F | Resp 18 | Wt 114.6 lb

## 2021-01-08 DIAGNOSIS — R519 Headache, unspecified: Secondary | ICD-10-CM

## 2021-01-08 DIAGNOSIS — R110 Nausea: Secondary | ICD-10-CM

## 2021-01-08 DIAGNOSIS — Z510 Encounter for antineoplastic radiation therapy: Secondary | ICD-10-CM | POA: Diagnosis not present

## 2021-01-08 DIAGNOSIS — C3490 Malignant neoplasm of unspecified part of unspecified bronchus or lung: Secondary | ICD-10-CM

## 2021-01-08 DIAGNOSIS — C7931 Secondary malignant neoplasm of brain: Secondary | ICD-10-CM | POA: Insufficient documentation

## 2021-01-08 DIAGNOSIS — C342 Malignant neoplasm of middle lobe, bronchus or lung: Secondary | ICD-10-CM | POA: Diagnosis present

## 2021-01-08 DIAGNOSIS — C7902 Secondary malignant neoplasm of left kidney and renal pelvis: Secondary | ICD-10-CM | POA: Diagnosis not present

## 2021-01-08 DIAGNOSIS — D6481 Anemia due to antineoplastic chemotherapy: Secondary | ICD-10-CM | POA: Diagnosis not present

## 2021-01-08 DIAGNOSIS — Z5111 Encounter for antineoplastic chemotherapy: Secondary | ICD-10-CM | POA: Insufficient documentation

## 2021-01-08 DIAGNOSIS — R7401 Elevation of levels of liver transaminase levels: Secondary | ICD-10-CM | POA: Diagnosis not present

## 2021-01-08 LAB — PHOSPHORUS: Phosphorus: 4.2 mg/dL (ref 2.5–4.6)

## 2021-01-08 LAB — COMPREHENSIVE METABOLIC PANEL
ALT: 15 U/L (ref 0–44)
AST: 26 U/L (ref 15–41)
Albumin: 3.2 g/dL — ABNORMAL LOW (ref 3.5–5.0)
Alkaline Phosphatase: 136 U/L — ABNORMAL HIGH (ref 38–126)
Anion gap: 9 (ref 5–15)
BUN: 20 mg/dL (ref 8–23)
CO2: 25 mmol/L (ref 22–32)
Calcium: 8.9 mg/dL (ref 8.9–10.3)
Chloride: 101 mmol/L (ref 98–111)
Creatinine, Ser: 1.12 mg/dL — ABNORMAL HIGH (ref 0.44–1.00)
GFR, Estimated: 53 mL/min — ABNORMAL LOW (ref >60)
Glucose, Bld: 107 mg/dL — ABNORMAL HIGH (ref 70–99)
Potassium: 4.4 mmol/L (ref 3.5–5.1)
Sodium: 135 mmol/L (ref 135–145)
Total Bilirubin: 0.4 mg/dL (ref 0.3–1.2)
Total Protein: 6.7 g/dL (ref 6.5–8.1)

## 2021-01-08 LAB — CBC WITH DIFFERENTIAL/PLATELET
Abs Immature Granulocytes: 0.02 10*3/uL (ref 0.00–0.07)
Basophils Absolute: 0 10*3/uL (ref 0.0–0.1)
Basophils Relative: 1 %
Eosinophils Absolute: 0.2 10*3/uL (ref 0.0–0.5)
Eosinophils Relative: 2 %
HCT: 28.6 % — ABNORMAL LOW (ref 36.0–46.0)
Hemoglobin: 9.1 g/dL — ABNORMAL LOW (ref 12.0–15.0)
Immature Granulocytes: 0 %
Lymphocytes Relative: 4 %
Lymphs Abs: 0.3 10*3/uL — ABNORMAL LOW (ref 0.7–4.0)
MCH: 28.9 pg (ref 26.0–34.0)
MCHC: 31.8 g/dL (ref 30.0–36.0)
MCV: 90.8 fL (ref 80.0–100.0)
Monocytes Absolute: 0.2 10*3/uL (ref 0.1–1.0)
Monocytes Relative: 3 %
Neutro Abs: 6.2 10*3/uL (ref 1.7–7.7)
Neutrophils Relative %: 90 %
Platelets: 335 10*3/uL (ref 150–400)
RBC: 3.15 MIL/uL — ABNORMAL LOW (ref 3.87–5.11)
RDW: 21.4 % — ABNORMAL HIGH (ref 11.5–15.5)
WBC: 7 10*3/uL (ref 4.0–10.5)
nRBC: 0 % (ref 0.0–0.2)

## 2021-01-08 MED ORDER — SODIUM CHLORIDE 0.9 % IV SOLN
Freq: Once | INTRAVENOUS | Status: DC
  Filled 2021-01-08: qty 250

## 2021-01-08 MED ORDER — HEPARIN SOD (PORK) LOCK FLUSH 100 UNIT/ML IV SOLN
500.0000 [IU] | Freq: Once | INTRAVENOUS | Status: AC | PRN
  Administered 2021-01-08: 500 [IU]
  Filled 2021-01-08: qty 5

## 2021-01-08 MED ORDER — SODIUM CHLORIDE 0.9% FLUSH
10.0000 mL | INTRAVENOUS | Status: DC | PRN
  Administered 2021-01-08: 10 mL
  Filled 2021-01-08: qty 10

## 2021-01-08 MED ORDER — SODIUM CHLORIDE 0.9% FLUSH
10.0000 mL | Freq: Once | INTRAVENOUS | Status: AC
  Administered 2021-01-08: 10 mL via INTRAVENOUS
  Filled 2021-01-08: qty 10

## 2021-01-08 MED ORDER — HEPARIN SOD (PORK) LOCK FLUSH 100 UNIT/ML IV SOLN
INTRAVENOUS | Status: AC
  Filled 2021-01-08: qty 5

## 2021-01-08 MED ORDER — PROCHLORPERAZINE MALEATE 10 MG PO TABS
10.0000 mg | ORAL_TABLET | Freq: Once | ORAL | Status: AC
Start: 2021-01-08 — End: 2021-01-08
  Administered 2021-01-08: 10 mg via ORAL
  Filled 2021-01-08: qty 1

## 2021-01-08 MED ORDER — SODIUM CHLORIDE 0.9 % IV SOLN
1600.0000 mg | Freq: Once | INTRAVENOUS | Status: AC
  Administered 2021-01-08: 1600 mg via INTRAVENOUS
  Filled 2021-01-08: qty 26.3

## 2021-01-08 NOTE — Patient Instructions (Signed)
[facility]  Discharge Instructions: Thank you for choosing [facility] to provide your oncology and hematology care.  If you have a lab appointment with the [facility], please go directly to the [facility] and check in at the registration area.  Wear comfortable clothing and clothing appropriate for easy access to any Portacath or PICC line.   We strive to give you quality time with your provider. You may need to reschedule your appointment if you arrive late (15 or more minutes).  Arriving late affects you and other patients whose appointments are after yours.  Also, if you miss three or more appointments without notifying the office, you may be dismissed from the clinic at the provider's discretion.      For prescription refill requests, have your pharmacy contact our office and allow 72 hours for refills to be completed.    Today you received the following chemotherapy and/or immunotherapy agents - gemcitabine      To help prevent nausea and vomiting after your treatment, we encourage you to take your nausea medication as directed.  BELOW ARE SYMPTOMS THAT SHOULD BE REPORTED IMMEDIATELY: *FEVER GREATER THAN 100.4 F (38 C) OR HIGHER *CHILLS OR SWEATING *NAUSEA AND VOMITING THAT IS NOT CONTROLLED WITH YOUR NAUSEA MEDICATION *UNUSUAL SHORTNESS OF BREATH *UNUSUAL BRUISING OR BLEEDING *URINARY PROBLEMS (pain or burning when urinating, or frequent urination) *BOWEL PROBLEMS (unusual diarrhea, constipation, pain near the anus) TENDERNESS IN MOUTH AND THROAT WITH OR WITHOUT PRESENCE OF ULCERS (sore throat, sores in mouth, or a toothache) UNUSUAL RASH, SWELLING OR PAIN  UNUSUAL VAGINAL DISCHARGE OR ITCHING   Items with * indicate a potential emergency and should be followed up as soon as possible or go to the Emergency Department if any problems should occur.  Please show the CHEMOTHERAPY ALERT CARD or IMMUNOTHERAPY ALERT CARD at  check-in to the Emergency Department and triage nurse.  Should you have questions after your visit or need to cancel or reschedule your appointment, please contact [facility]  [phone] and follow the prompts.  Office hours are 8:00 a.m. to 4:30 p.m. Monday - Friday. Please note that voicemails left after 4:00 p.m. may not be returned until the following business day.  We are closed weekends and major holidays. You have access to a nurse at all times for urgent questions. Please call the main number to the clinic [phone] and follow the prompts.  For any non-urgent questions, you may also contact your provider using MyChart. We now offer e-Visits for anyone 35 and older to request care online for non-urgent symptoms. For details visit [email or web address].   Also download the MyChart app! Go to the app store, search "MyChart", open the app, select [facility], and log in with your MyChart username and password.  Due to Covid, a mask is required upon entering the hospital/clinic. If you do not have a mask, one will be given to you upon arrival. For doctor visits, patients may have 1 support person aged 33 or older with them. For treatment visits, patients cannot have anyone with them due to current Covid guidelines and our immunocompromised population.

## 2021-01-08 NOTE — Progress Notes (Signed)
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  INTERVAL HISTORY: Patient returns to clinic today for further evaluation and consideration of cycle 4, day 8 of single agent gemcitabine.  She was last seen in clinic on 12/24/2020.  Continues to tolerate treatments well.  She met with Dr. Donella Stade 12/25/2020 to discuss radiation given she was having headaches.  Plan is for 10 fractions. She started XRT on 01/06/2021 and has received 2 of 10 treatments.   She denies any new or acute symptoms.  Everything is fairly stable.  REVIEW OF SYSTEMS:   Review of Systems  Constitutional:  Positive for malaise/fatigue. Negative for fever and weight loss.  Respiratory: Negative.  Negative for cough, hemoptysis and shortness of breath.   Cardiovascular: Negative.  Negative for chest pain and leg swelling.  Gastrointestinal: Negative.  Negative for abdominal pain, constipation, diarrhea and nausea.  Genitourinary: Negative.  Negative for dysuria and flank pain.  Musculoskeletal:  Negative for back pain and joint pain.  Skin: Negative.  Negative for rash.  Neurological:  Positive for weakness. Negative for dizziness, sensory change, focal weakness and headaches.  Psychiatric/Behavioral: Negative.  The patient is not nervous/anxious.    As per HPI. Otherwise, a complete review of systems is negative.  PAST MEDICAL HISTORY: Past Medical History:  Diagnosis Date   Abnormal echocardiogram    Acid reflux    Allergic rhinitis    Anemia    HAD TO HAVE IRON INFUSIONS BACK IN 2015   Arthritis    bil hands and back   Asthma    Bloody discharge from left nipple    Cancer of right lung ([place]) 2015   lung  cancer - chemo, Dr Genevive Bi removed RML Lobectomy.    Colon polyps    COPD (chronic obstructive pulmonary disease) (HCC)    Dyspnea    WITH EXERTION   Family history of breast cancer    Family history of cervical cancer    Family history of colon cancer    Family history of skin cancer    Hypertension    Malignant neoplasm of middle lobe of right lung ([place])    Mitral valve prolapse    Mitral valve prolapse    Non-small cell lung cancer (HCC)    Personal history of chemotherapy    F/U Lung Cancer   Personal history of radiation therapy    Lung cancer   Primary cancer of right middle lobe of lung ([place]) 03/11/2016   Squamous cell carcinoma of skin 07/07/2009   right supraclavicular/EDC   Squamous cell carcinoma of skin 11/04/2019   Left pretibial. [facility] SCC.   Stage IV adenocarcinoma of lung, unspecified laterality ([place])     PAST SURGICAL HISTORY: Past Surgical History:  Procedure Laterality Date   ABDOMINAL HYSTERECTOMY     BAND HEMORRHOIDECTOMY     BREAST BIOPSY Left 03/01/2019   Procedure: LEFT BREAST EXCISIONAL BIOPSY WITH NEEDLE LOCALIZATION;  Surgeon: Herbert Pun, MD;  Location: [facility] ORS;  Service: General;  Laterality: Left;   BREAST EXCISIONAL BIOPSY Left 1994 (-), 2004 (-)   benign   BREAST EXCISIONAL BIOPSY Left 03/01/2019   - INTRADUCTAL PAPILLOMA WITH SCLEROSIS.    COLONOSCOPY  2008    Dr. Donnella Sham  COLONOSCOPY WITH PROPOFOL N/A 04/12/2019   Procedure: COLONOSCOPY WITH PROPOFOL;  Surgeon: Lollie Sails, MD;  Location: [facility] ENDOSCOPY;  Service: Endoscopy;  Laterality: N/A;   IR PORT REPAIR CENTRAL VENOUS ACCESS DEVICE  11/13/2020   IR RADIOLOGIST EVAL & MGMT  08/28/2019   IR RADIOLOGIST EVAL & MGMT  10/30/2019   IR RADIOLOGIST EVAL & MGMT  11/26/2019   KNEE SURGERY  2007   LUNG LOBECTOMY Right    middle lobe   PORTACATH PLACEMENT Right 02/09/2017   Procedure: INSERTION PORT-A-CATH;  Surgeon: Nestor Lewandowsky, MD;  Location: [facility] ORS;  Service: General;  Laterality:  Right;   RADIOLOGY WITH ANESTHESIA N/A 10/02/2019   Procedure: MICROWAVE THERMA ABLATION;  Surgeon: Aletta Edouard, MD;  Location: [facility] ORS;  Service: Radiology;  Laterality: N/A;   SALIVARY GLAND SURGERY Left 1983   with lymph node removal also   SALIVARY GLAND SURGERY     salivary gland removal    UPPER GI ENDOSCOPY  2008   VIDEO BRONCHOSCOPY WITH ENDOBRONCHIAL ULTRASOUND Right 01/24/2017   Procedure: VIDEO BRONCHOSCOPY WITH ENDOBRONCHIAL ULTRASOUND;  Surgeon: [name], MD;  Location: [facility] ORS;  Service: Pulmonary;  Laterality: Right;    FAMILY HISTORY: Family History  Problem Relation Age of Onset   Breast cancer Maternal Aunt 6   Alzheimer's disease Maternal Aunt    Breast cancer Paternal Aunt        dx. in her late 43s   Breast cancer Maternal Grandmother        dx. in her 7s   Cervical cancer Maternal Grandmother 60   Colon cancer Maternal Grandmother        dx. in her 43s   Breast cancer Maternal Aunt 60   Alzheimer's disease Maternal Aunt    Breast cancer Paternal Aunt        dx. in her late 82s   Heart disease Paternal 51    Healthy Mother    Healthy Father    Colon cancer Father 24   Skin cancer Father    Heart attack Father    Heart attack Paternal Grandfather    Skin cancer Paternal Aunt    Heart attack Paternal Uncle 42   Heart attack Paternal Uncle     ADVANCED DIRECTIVES (Y/N):  N  HEALTH MAINTENANCE: Social History   Tobacco Use   Smoking status: Never   Smokeless tobacco: Never  Vaping Use   Vaping Use: Never used  Substance Use Topics   Alcohol use: Yes    Alcohol/week: 7.0 standard drinks    Types: 7 Glasses of wine per week   Drug use: No     Colonoscopy:  PAP:  Bone density:  Lipid panel:  Allergies  Allergen Reactions   Other Other (See Comments)    Cat gut sutures get infected Cat gut sutures get infected   Penicillins Swelling and Rash    Did it involve swelling of the face/tongue/throat, SOB, or low BP?  Unknown Did it involve sudden or severe rash/hives, skin peeling, or any reaction on the inside of your mouth or nose? No Did you need to seek medical attention at a hospital or doctor's office? Yes When did it last happen?      30 + years  If all above answers are "NO", may proceed with cephalosporin use.     Sulfa Antibiotics Nausea And Vomiting    Current Outpatient Medications  Medication Sig Dispense Refill   albuterol (VENTOLIN HFA) 108 (90 Base) MCG/ACT inhaler Inhale  1-2 puffs into the lungs every 6 (six) hours as needed for wheezing or shortness of breath. 1 g 10   ALPRAZolam (XANAX) 0.5 MG tablet take 1-2 tablets by mouth at night as needed for sleep 60 tablet 3   ascorbic acid (VITAMIN C) 100 MG tablet Take 100 mg by mouth daily.      Biotin w/ Vitamins C & E (HAIR/SKIN/NAILS PO) Take 1 tablet by mouth daily.     Calcium Carb-Cholecalciferol 600-800 MG-UNIT TABS Take 2 tablets by mouth daily.     Cholecalciferol 125 MCG (5000 UT) capsule Take 5,000 Units by mouth daily.     clobetasol ointment (TEMOVATE) 3.71 % Apply 1 application topically 2 (two) times daily. Dry area and apply to mouth sores twice daily as needed for up to 2 weeks. 15 g 0   COLLAGEN PO Take 1,000 mg by mouth daily.     cyclobenzaprine (FLEXERIL) 5 MG tablet Take 1 tablet (5 mg total) by mouth 3 (three) times daily as needed for muscle spasms. 30 tablet 0   dexamethasone (DECADRON) 4 MG tablet Take 1 tablet (4 mg total) by mouth 2 (two) times daily. 30 tablet 0   fexofenadine (ALLEGRA) 180 MG tablet Take 180 mg by mouth daily as needed for allergies.      fluticasone (FLONASE) 50 MCG/ACT nasal spray Place 2 sprays into both nostrils daily as needed for allergies or rhinitis.     fluticasone-salmeterol (ADVAIR) 250-50 MCG/ACT AEPB Inhale 1 puff into the lungs 2 (two) times daily as needed for breathing issues. 60 each 8   hydrocortisone (ANUSOL-HC) 25 MG suppository Place 1 suppository (25 mg total) rectally 2  (two) times daily for 10 days 20 suppository 0   ibuprofen (ADVIL) 800 MG tablet TAKE 1 TABLET BY MOUTH EVERY 8 HOURS AS NEEDED FOR PAIN **ALWAYS TAKE WITH FOOD** 90 tablet 2   lansoprazole (PREVACID) 30 MG capsule TAKE 1 CAPSULE BY MOUTH TWICE DAILY (Patient taking differently: Take by mouth in the morning and at bedtime.) 180 capsule 3   lidocaine-prilocaine (EMLA) cream Apply 1 application topically as needed. Apply generously over the Mediport 45 minutes prior to chemotherapy. 30 g 0   metoprolol succinate (TOPROL-XL) 25 MG 24 hr tablet Take 1 tablet (25 mg total) by mouth once daily 30 tablet 6   ondansetron (ZOFRAN) 8 MG tablet Take 1 tablet (8 mg total) by mouth every 8 (eight) hours as needed for nausea or vomiting (start 3 days; after chemo). 40 tablet 1   osimertinib mesylate (TAGRISSO) 80 MG tablet Take 80 mg by mouth daily.     polyethylene glycol (MIRALAX / GLYCOLAX) packet Take 17 g by mouth daily as needed for mild constipation.     prochlorperazine (COMPAZINE) 10 MG tablet Take 1 tablet (10 mg total) by mouth every 6 (six) hours as needed for nausea or vomiting. 60 tablet 2   triamcinolone (KENALOG) 0.1 % paste Use as directed 1 application in the mouth or throat 2 (two) times daily. 15 g 12   Vitamin D, Ergocalciferol, (DRISDOL) 1.25 MG (50000 UNIT) CAPS capsule TAKE 1 CAPSULE (50,000 UNITS TOTAL) BY MOUTH ONCE A WEEK 12 capsule 3   zinc gluconate 50 MG tablet Take 50 mg by mouth daily.     ALPRAZolam (XANAX) 0.25 MG tablet Take 1 tablet (0.25 mg total) by mouth at bedtime as needed for anxiety. (Patient not taking: Reported on 01/08/2021) 30 tablet 0   B Complex Vitamins (VITAMIN B COMPLEX PO)  Take 1 tablet by mouth daily.  (Patient not taking: Reported on 01/08/2021)     Fluticasone-Salmeterol (ADVAIR) 250-50 MCG/DOSE AEPB INHALE 1 PUFF BY MOUTH 2 TIMES DAILY AS NEEDED FOR BREATHING ISSUES 60 each 10   magic mouthwash SOLN Take 5-10 mLs by mouth 4 (four) times daily as needed for mouth  pain. (Patient not taking: Reported on 01/08/2021) 480 mL 0   metoprolol succinate (TOPROL-XL) 25 MG 24 hr tablet Take 0.5 tablets (12.5 mg total) by mouth once daily (Patient not taking: Reported on 01/08/2021) 15 tablet 6   No current facility-administered medications for this visit.   Facility-Administered Medications Ordered in Other Visits  Medication Dose Route Frequency Provider Last Rate Last Admin   0.9 %  sodium chloride infusion   Intravenous Once Grayland Ormond, Kathlene November, MD       heparin lock flush 100 unit/mL  500 Units Intravenous Once Grayland Ormond, Kathlene November, MD       sodium chloride flush (NS) 0.9 % injection 10 mL  10 mL Intravenous PRN Lloyd Huger, MD       sodium chloride flush (NS) 0.9 % injection 10 mL  10 mL Intravenous PRN Lloyd Huger, MD   10 mL at 01/22/18 1419   sodium chloride flush (NS) 0.9 % injection 10 mL  10 mL Intracatheter PRN Lloyd Huger, MD   10 mL at 01/08/21 1153    OBJECTIVE: Vitals:   01/08/21 1119  BP: 118/85  Pulse: 96  Resp: 18  Temp: 97.6 F (36.4 C)     Body mass index is 20.96 kg/m.    ECOG FS:1 - Symptomatic but completely ambulatory  Physical Exam Constitutional:      Appearance: Normal appearance.  HENT:     Head: Normocephalic and atraumatic.  Eyes:     Pupils: Pupils are equal, round, and reactive to light.  Cardiovascular:     Rate and Rhythm: Normal rate and regular rhythm.     Heart sounds: Normal heart sounds. No murmur heard. Pulmonary:     Effort: Pulmonary effort is normal.     Breath sounds: Normal breath sounds. No wheezing.  Abdominal:     General: Bowel sounds are normal. There is no distension.     Palpations: Abdomen is soft.     Tenderness: There is no abdominal tenderness.  Musculoskeletal:        General: Normal range of motion.     Cervical back: Normal range of motion.  Skin:    General: Skin is warm and dry.     Findings: No rash.  Neurological:     Mental Status: She is alert and  oriented to person, place, and time.  Psychiatric:        Judgment: Judgment normal.     LAB RESULTS:  Lab Results  Component Value Date   NA 135 01/08/2021   K 4.4 01/08/2021   CL 101 01/08/2021   CO2 25 01/08/2021   GLUCOSE 107 (H) 01/08/2021   BUN 20 01/08/2021   CREATININE 1.12 (H) 01/08/2021   CALCIUM 8.9 01/08/2021   PROT 6.7 01/08/2021   ALBUMIN 3.2 (L) 01/08/2021   AST 26 01/08/2021   ALT 15 01/08/2021   ALKPHOS 136 (H) 01/08/2021   BILITOT 0.4 01/08/2021   GFRNONAA 53 (L) 01/08/2021   GFRAA >60 03/23/2020    Lab Results  Component Value Date   WBC 7.0 01/08/2021   NEUTROABS 6.2 01/08/2021   HGB 9.1 (L) 01/08/2021  HCT 28.6 (L) 01/08/2021   MCV 90.8 01/08/2021   PLT 335 01/08/2021     STUDIES: MR Brain W Wo Contrast  Result Date: 12/16/2020 CLINICAL DATA:  Lung cancer.  Assess for metastatic disease. EXAM: MRI HEAD WITHOUT AND WITH CONTRAST TECHNIQUE: Multiplanar, multiecho pulse sequences of the brain and surrounding structures were obtained without and with intravenous contrast. CONTRAST:  31m GADAVIST GADOBUTROL 1 MMOL/ML IV SOLN COMPARISON:  10/05/2020 FINDINGS: Brain: Diffusion imaging does not show any acute or subacute infarction or other cause of restricted diffusion. The brain shows mild age related atrophy without evidence of prior small or large vessel infarction. The brainstem and cerebellum are normal. Cerebral hemispheres show a dilated perivascular space in the external capsule on the left. No metastatic disease seen in the cerebellum. There is an insignificant venous angioma on the right. There is a 2 mm metastasis in the right posterior inferior temporal lobe, axial image 58. There is a 2 x 3 mm surface metastasis along the gyral surface of a right posterior frontal gyrus axial image 115. Second surface metastasis along the gyral surface in the right parietal region axial image 115. 2 mm metastasis along the gyral surface of a medial right frontal  gyrus axial image 118. Subtle punctate focus of enhancement along the gyral surface in the left parietal lobe axial image 126, actually better seen on the prior exam. Vascular: Major vessels at the base of the brain show flow. Skull and upper cervical spine: Negative Sinuses/Orbits: Clear/normal Other: None IMPRESSION: Four tiny metastases noted in the right hemisphere as outlined above. One tiny metastasis in the left hemisphere, less conspicuous than on the study of 10/05/2020. No newly seen lesion. Electronically Signed   By: MNelson ChimesM.D.   On: 12/16/2020 19:21   CT CHEST ABDOMEN PELVIS W CONTRAST  Result Date: 12/17/2020 CLINICAL DATA:  Non-small cell lung cancer, status post right middle lobectomy and radiation therapy with metastatic recurrence EXAM: CT CHEST, ABDOMEN, AND PELVIS WITH CONTRAST TECHNIQUE: Multidetector CT imaging of the chest, abdomen and pelvis was performed following the standard protocol during bolus administration of intravenous contrast. CONTRAST:  828mOMNIPAQUE IOHEXOL 300 MG/ML SOLN, additional oral enteric contrast COMPARISON:  Outside CT chest abdomen pelvis, 10/05/2020 FINDINGS: CT CHEST FINDINGS Cardiovascular: Right chest port catheter. Scattered aortic atherosclerosis. Normal heart size. No pericardial effusion. Mediastinum/Nodes: Unchanged post treatment appearance of ill-defined soft tissue about the right hilum and mediastinum, without discretely enlarged mediastinal lymph nodes. There are bulky left supraclavicular and cervical lymph nodes, largest measuring 3.1 x 2.2 cm, unchanged (series 2, image 7). Thyroid gland, trachea, and esophagus demonstrate no significant findings. Lungs/Pleura: Unchanged appearance of the right hemithorax, with extensive soft tissue and post treatment fibrosis about the right hilum, diffuse interlobular septal thickening and heterogeneous airspace opacity, as well as diffuse right-sided pleural thickening and a small, loculated right  pleural effusion. There are multiple small pulmonary nodules throughout the lungs, not significantly changed, an index nodule of the left upper lobe measuring 4 mm (series 3, image 56) and an index nodule of the medial left lung base measuring 5 mm (series 3, image 117). Musculoskeletal: No chest wall mass or suspicious bone lesions identified. CT ABDOMEN PELVIS FINDINGS Hepatobiliary: Numerous low-attenuation lesions throughout the liver are increased in size compared to prior examination, an index lesion of the left lobe of the liver measuring 3.1 x 2.6 cm, previously 2.2 x 1.9 cm (series 2, image 56). An index lesion of the peripheral liver dome  measures 2.6 x 2.6 cm, previously 2.4 x 2.1 cm (series 2, image 52). No gallstones, gallbladder wall thickening, or biliary dilatation. Pancreas: Unremarkable. No pancreatic ductal dilatation or surrounding inflammatory changes. Spleen: Normal in size without significant abnormality. Adrenals/Urinary Tract: Adrenal glands are unremarkable. There is a large exophytic mass of the lateral midportion of the left kidney, slightly increased in size, measuring 5.4 x 3.5 cm, previously 4.7 x 3.3 cm (series 2, image 71). Bladder is unremarkable. Stomach/Bowel: Stomach is within normal limits. Appendix appears normal. No evidence of bowel wall thickening, distention, or inflammatory changes. Vascular/Lymphatic: No significant vascular findings are present. Redemonstrated bulky retroperitoneal lymphadenopathy, largest node conglomerate in the left retroperitoneum measuring 4.3 x 3.7 cm, not significantly changed (series 2, image 70). Reproductive: Status post hysterectomy. Other: No abdominal wall hernia or abnormality. No abdominopelvic ascites. Musculoskeletal: No acute or significant osseous findings. IMPRESSION: 1. Unchanged post treatment appearance of the right hemithorax, with extensive soft tissue and post treatment fibrosis about the right hilum, diffuse interlobular septal  thickening and heterogeneous airspace opacity, as well as diffuse right-sided pleural thickening and a small, loculated right pleural effusion. Findings are consistent with advanced lung malignancy including pleural and lymphangitic disease. 2. There are multiple small pulmonary nodules throughout the lungs, not significantly changed, consistent with pulmonary metastatic disease. 3. Numerous low-attenuation lesions throughout the liver are increased in size compared to prior examination, findings consistent with worsened hepatic metastatic disease. 4. Large exophytic mass of the lateral midportion of the left kidney, increased in size, consistent with worsened metastasis. 5. Redemonstrated bulky retroperitoneal lymphadenopathy, not significantly changed. 6. Overall constellation of findings with significant evidence of metastatic disease progression in the abdomen and pelvis, however findings in the chest are stable. Aortic Atherosclerosis (ICD10-I70.0). Electronically Signed   By: Eddie Candle M.D.   On: 12/17/2020 09:21     ONCOLOGY HISTORY: Patient initially underwent resection in May 2015 and was noted to have the EGFR mutation.  She initially received 4 cycles of adjuvant cisplatin and Alimta completing on January 23, 2014.  Patient was on the [facility] protocol for maintenance Tarceva versus placebo, but discontinued treatment in November 2015 because of significant side effects.  Although blinded, presumption was she was receiving Tarceva.  She recently was noted to have a recurrence confirmed by right paratracheal lymph node biopsy on January 24, 2017.  PET scan on February 03, 2017 did not reveal any distant metastasis.  She underwent treatment with weekly carboplatinum and Taxol along with daily XRT finishing in October 2018.  She initiated maintenance durvalumab on June 29, 2017.  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  Patient received Tagrisso  from April 2019 through September 2021. PET scan results from July 09, 2019 revealed mild progression of disease in her lungs with an isolated left kidney metastasis that has undergone ablation. PET scan on March 11, 2020 revealed continued progression of disease in her lungs.    She subsequently discontinued Tagrisso and initiated single agent pemetrexed on March 23, 2020.    Patient enrolled in clinical trial at [facility] between December 2021 and April 2022.  She initiated single agent gemcitabine on October 15, 2020.   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   Retroperitoneal adenopathy is not significantly changed.   Patient essentially has an equivocal response to her treatment and given her limited treatment options, will continue with single agent gemcitabine.   MRI of the brain on December 16, 2020 reviewed independently and reported as above with 4 lesions noted.   Labs today are stable-neutropenia and anemia have improved. Proceed with cycle 5, day 1 of treatment. Return to clinic in 1 week for further evaluation and consideration of cycle 5, day 8 and in 3 weeks for cycle 6-day 1.  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 Currently undergoing radiation. She has received 2 of 10 fractions.   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 She takes Compazine with relief of her symptoms.   Anemia: Secondary to treatments. Improved from previous. We will continue to monitor.   Disposition: Proceed with treatment today. RTC in 1 week for repeat labs, MD assessment and  cycle 5-day 8 gemcitabine.  Greater than 50% was spent in counseling and coordination of care with this patient including but not limited to discussion of the relevant topics above (See A&P) including, but not limited to diagnosis and management of acute and chronic medical conditions.    Patient expressed understanding and was in agreement with this plan. She also understands that She can call clinic at any time with any questions, concerns, or complaints.    Jacquelin Hawking, NP   01/08/2021 12:54 PM

## 2021-01-08 NOTE — Progress Notes (Signed)
Patient does have nausea that is relieved with home prescriptions.

## 2021-01-11 ENCOUNTER — Ambulatory Visit
Admission: RE | Admit: 2021-01-11 | Discharge: 2021-01-11 | Disposition: A | Discharge status: Home / Self Care | Payer: Medicare Other | Source: Ambulatory Visit | Attending: Radiation Oncology | Admitting: Radiation Oncology

## 2021-01-11 DIAGNOSIS — Z510 Encounter for antineoplastic radiation therapy: Secondary | ICD-10-CM | POA: Diagnosis not present

## 2021-01-12 ENCOUNTER — Other Ambulatory Visit: Payer: Self-pay

## 2021-01-12 ENCOUNTER — Other Ambulatory Visit: Payer: Self-pay | Admitting: *Deleted

## 2021-01-12 ENCOUNTER — Ambulatory Visit
Admission: RE | Admit: 2021-01-12 | Discharge: 2021-01-12 | Disposition: A | Discharge status: Home / Self Care | Payer: Medicare Other | Source: Ambulatory Visit | Attending: Radiation Oncology | Admitting: Radiation Oncology

## 2021-01-12 DIAGNOSIS — Z510 Encounter for antineoplastic radiation therapy: Secondary | ICD-10-CM | POA: Diagnosis not present

## 2021-01-12 MED ORDER — IPRATROPIUM BROMIDE 0.06 % NA SOLN
NASAL | 6 refills | Status: AC
  Filled 2021-01-12: qty 15, 28d supply, fill #0

## 2021-01-13 ENCOUNTER — Other Ambulatory Visit: Payer: Self-pay

## 2021-01-13 ENCOUNTER — Ambulatory Visit
Admission: RE | Admit: 2021-01-13 | Discharge: 2021-01-13 | Disposition: A | Discharge status: Home / Self Care | Payer: Medicare Other | Source: Ambulatory Visit | Attending: Radiation Oncology | Admitting: Radiation Oncology

## 2021-01-13 DIAGNOSIS — Z510 Encounter for antineoplastic radiation therapy: Secondary | ICD-10-CM | POA: Diagnosis not present

## 2021-01-14 ENCOUNTER — Ambulatory Visit: Payer: Medicare Other

## 2021-01-15 ENCOUNTER — Ambulatory Visit
Admission: RE | Admit: 2021-01-15 | Discharge: 2021-01-15 | Disposition: A | Discharge status: Home / Self Care | Payer: Medicare Other | Source: Ambulatory Visit | Attending: Radiation Oncology | Admitting: Radiation Oncology

## 2021-01-15 ENCOUNTER — Inpatient Hospital Stay: Payer: Medicare Other

## 2021-01-15 ENCOUNTER — Inpatient Hospital Stay (HOSPITAL_BASED_OUTPATIENT_CLINIC_OR_DEPARTMENT_OTHER): Payer: Medicare Other | Admitting: Oncology

## 2021-01-15 ENCOUNTER — Other Ambulatory Visit: Payer: Self-pay

## 2021-01-15 ENCOUNTER — Encounter: Payer: Self-pay | Admitting: Oncology

## 2021-01-15 VITALS — BP 123/75 | HR 88 | Temp 98.6°F | Resp 18 | Wt 115.4 lb

## 2021-01-15 DIAGNOSIS — D696 Thrombocytopenia, unspecified: Secondary | ICD-10-CM | POA: Diagnosis not present

## 2021-01-15 DIAGNOSIS — T451X5A Adverse effect of antineoplastic and immunosuppressive drugs, initial encounter: Secondary | ICD-10-CM

## 2021-01-15 DIAGNOSIS — C3490 Malignant neoplasm of unspecified part of unspecified bronchus or lung: Secondary | ICD-10-CM | POA: Diagnosis not present

## 2021-01-15 DIAGNOSIS — D6481 Anemia due to antineoplastic chemotherapy: Secondary | ICD-10-CM | POA: Diagnosis not present

## 2021-01-15 DIAGNOSIS — C7931 Secondary malignant neoplasm of brain: Secondary | ICD-10-CM

## 2021-01-15 DIAGNOSIS — Z5111 Encounter for antineoplastic chemotherapy: Secondary | ICD-10-CM

## 2021-01-15 DIAGNOSIS — Z510 Encounter for antineoplastic radiation therapy: Secondary | ICD-10-CM | POA: Diagnosis not present

## 2021-01-15 DIAGNOSIS — R7401 Elevation of levels of liver transaminase levels: Secondary | ICD-10-CM

## 2021-01-15 LAB — COMPREHENSIVE METABOLIC PANEL
ALT: 59 U/L — ABNORMAL HIGH (ref 0–44)
AST: 45 U/L — ABNORMAL HIGH (ref 15–41)
Albumin: 3.5 g/dL (ref 3.5–5.0)
Alkaline Phosphatase: 121 U/L (ref 38–126)
Anion gap: 10 (ref 5–15)
BUN: 17 mg/dL (ref 8–23)
CO2: 23 mmol/L (ref 22–32)
Calcium: 8.8 mg/dL — ABNORMAL LOW (ref 8.9–10.3)
Chloride: 99 mmol/L (ref 98–111)
Creatinine, Ser: 1.02 mg/dL — ABNORMAL HIGH (ref 0.44–1.00)
GFR, Estimated: 59 mL/min — ABNORMAL LOW (ref >60)
Glucose, Bld: 102 mg/dL — ABNORMAL HIGH (ref 70–99)
Potassium: 3.8 mmol/L (ref 3.5–5.1)
Sodium: 132 mmol/L — ABNORMAL LOW (ref 135–145)
Total Bilirubin: 0.6 mg/dL (ref 0.3–1.2)
Total Protein: 6.9 g/dL (ref 6.5–8.1)

## 2021-01-15 LAB — CBC WITH DIFFERENTIAL/PLATELET
Abs Immature Granulocytes: 0.03 10*3/uL (ref 0.00–0.07)
Basophils Absolute: 0 10*3/uL (ref 0.0–0.1)
Basophils Relative: 1 %
Eosinophils Absolute: 0.2 10*3/uL (ref 0.0–0.5)
Eosinophils Relative: 5 %
HCT: 28.7 % — ABNORMAL LOW (ref 36.0–46.0)
Hemoglobin: 9.2 g/dL — ABNORMAL LOW (ref 12.0–15.0)
Immature Granulocytes: 1 %
Lymphocytes Relative: 10 %
Lymphs Abs: 0.4 10*3/uL — ABNORMAL LOW (ref 0.7–4.0)
MCH: 29 pg (ref 26.0–34.0)
MCHC: 32.1 g/dL (ref 30.0–36.0)
MCV: 90.5 fL (ref 80.0–100.0)
Monocytes Absolute: 0.3 10*3/uL (ref 0.1–1.0)
Monocytes Relative: 9 %
Neutro Abs: 2.9 10*3/uL (ref 1.7–7.7)
Neutrophils Relative %: 74 %
Platelets: 128 10*3/uL — ABNORMAL LOW (ref 150–400)
RBC: 3.17 MIL/uL — ABNORMAL LOW (ref 3.87–5.11)
RDW: 20.5 % — ABNORMAL HIGH (ref 11.5–15.5)
WBC: 3.9 10*3/uL — ABNORMAL LOW (ref 4.0–10.5)
nRBC: 0 % (ref 0.0–0.2)

## 2021-01-15 MED ORDER — SODIUM CHLORIDE 0.9 % IV SOLN
1600.0000 mg | Freq: Once | INTRAVENOUS | Status: AC
  Administered 2021-01-15: 1600 mg via INTRAVENOUS
  Filled 2021-01-15: qty 26.3

## 2021-01-15 MED ORDER — HEPARIN SOD (PORK) LOCK FLUSH 100 UNIT/ML IV SOLN
500.0000 [IU] | Freq: Once | INTRAVENOUS | Status: AC
  Administered 2021-01-15: 500 [IU] via INTRAVENOUS
  Filled 2021-01-15: qty 5

## 2021-01-15 MED ORDER — PROCHLORPERAZINE MALEATE 10 MG PO TABS
10.0000 mg | ORAL_TABLET | Freq: Once | ORAL | Status: AC
  Administered 2021-01-15: 10 mg via ORAL
  Filled 2021-01-15: qty 1

## 2021-01-15 MED ORDER — HEPARIN SOD (PORK) LOCK FLUSH 100 UNIT/ML IV SOLN
INTRAVENOUS | Status: AC
  Filled 2021-01-15: qty 5

## 2021-01-15 MED ORDER — SODIUM CHLORIDE 0.9 % IV SOLN
Freq: Once | INTRAVENOUS | Status: AC
  Filled 2021-01-15: qty 250

## 2021-01-15 MED ORDER — SODIUM CHLORIDE 0.9% FLUSH
10.0000 mL | Freq: Once | INTRAVENOUS | Status: AC
Start: 2021-01-15 — End: 2021-01-15
  Administered 2021-01-15: 10 mL via INTRAVENOUS
  Filled 2021-01-15: qty 10

## 2021-01-15 NOTE — Progress Notes (Signed)
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  She initially received 4 cycles of adjuvant cisplatin and Alimta completing on January 23, 2014.  Patient was on the [facility] protocol for maintenance Tarceva versus placebo, but discontinued treatment in November 2015 because of significant side effects.  Although blinded, presumption was she was receiving Tarceva.  Recurrence confirmed by right paratracheal lymph node biopsy on January 24, 2017.  PET scan on February 03, 2017 did not reveal any distant metastasis.  She underwent treatment with weekly carboplatinum and Taxol along with daily XRT finishing in October 2018.  She initiated maintenance durvalumab on June 29, 2017.  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  Patient received Tagrisso from April 2019 through September 2021. PET scan results from July 09, 2019 revealed mild progression of disease in her lungs with an isolated left kidney metastasis that has undergone ablation. PET scan on March 11, 2020 revealed continued progression of disease in her lungs.    She subsequently discontinued Tagrisso and initiated single agent pemetrexed on March 23, 2020.    Patient enrolled in clinical trial at [facility] between December 2021 and April 2022.  She initiated single agent gemcitabine on October 15, 2020.  INTERVAL HISTORY:  Patient presents to clinic for evaluation prior to gemcitabine treatments. Patient is followed up by Dr. Cecille Aver, and I am covering him to  see the patient today. Extensive medical record review was performed by me She has chronic weakness and fatigue which has not significant changed.  She is currently on brain radiation and takes steroids.  Patient reports she is taking half of the dexamethasone tablet.  Dexamethasone causes insomnia. No fever, chills, nausea vomiting diarrhea.  No shortness of breath.  Cough is at baseline.  REVIEW OF SYSTEMS:   Review of Systems  Constitutional:  Positive for malaise/fatigue. Negative for fever and weight loss.  Respiratory: Negative.  Negative for cough, hemoptysis and shortness of breath.   Cardiovascular: Negative.  Negative for chest pain and leg swelling.  Gastrointestinal: Negative.  Negative for abdominal pain, constipation, diarrhea and nausea.  Genitourinary: Negative.  Negative for dysuria and flank pain.  Musculoskeletal:  Negative for back pain and joint pain.  Skin: Negative.  Negative for rash.  Neurological:  Positive for weakness. Negative for dizziness, sensory change, focal weakness and headaches.  Psychiatric/Behavioral:  The patient has insomnia. The patient is not nervous/anxious.    As per HPI. Otherwise, a complete review of systems is negative.  PAST MEDICAL HISTORY: Past Medical History:  Diagnosis Date   Abnormal echocardiogram    Acid reflux    Allergic rhinitis    Anemia    HAD TO HAVE IRON INFUSIONS BACK IN 2015   Arthritis    bil hands and back   Asthma    Bloody discharge from left nipple    Cancer of right lung ([place]) 2015   lung cancer - chemo, Dr Genevive Bi removed RML Lobectomy.    Colon polyps    COPD (chronic obstructive pulmonary disease) (HCC)    Dyspnea  WITH EXERTION   Family history of breast cancer    Family history of cervical cancer    Family history of colon cancer    Family history of skin cancer    Hypertension    Malignant neoplasm of middle lobe of right lung ([place])    Mitral valve prolapse    Mitral valve prolapse    Non-small  cell lung cancer (HCC)    Personal history of chemotherapy    F/U Lung Cancer   Personal history of radiation therapy    Lung cancer   Primary cancer of right middle lobe of lung ([place]) 03/11/2016   Squamous cell carcinoma of skin 07/07/2009   right supraclavicular/EDC   Squamous cell carcinoma of skin 11/04/2019   Left pretibial. [facility] SCC.   Stage IV adenocarcinoma of lung, unspecified laterality ([place])     PAST SURGICAL HISTORY: Past Surgical History:  Procedure Laterality Date   ABDOMINAL HYSTERECTOMY     BAND HEMORRHOIDECTOMY     BREAST BIOPSY Left 03/01/2019   Procedure: LEFT BREAST EXCISIONAL BIOPSY WITH NEEDLE LOCALIZATION;  Surgeon: Herbert Pun, MD;  Location: [facility] ORS;  Service: General;  Laterality: Left;   BREAST EXCISIONAL BIOPSY Left 1994 (-), 2004 (-)   benign   BREAST EXCISIONAL BIOPSY Left 03/01/2019   - INTRADUCTAL PAPILLOMA WITH SCLEROSIS.    COLONOSCOPY  2008    Dr. Donnella Sham   COLONOSCOPY WITH PROPOFOL N/A 04/12/2019   Procedure: COLONOSCOPY WITH PROPOFOL;  Surgeon: Lollie Sails, MD;  Location: [facility] ENDOSCOPY;  Service: Endoscopy;  Laterality: N/A;   IR PORT REPAIR CENTRAL VENOUS ACCESS DEVICE  11/13/2020   IR RADIOLOGIST EVAL & MGMT  08/28/2019   IR RADIOLOGIST EVAL & MGMT  10/30/2019   IR RADIOLOGIST EVAL & MGMT  11/26/2019   KNEE SURGERY  2007   LUNG LOBECTOMY Right    middle lobe   PORTACATH PLACEMENT Right 02/09/2017   Procedure: INSERTION PORT-A-CATH;  Surgeon: Nestor Lewandowsky, MD;  Location: [facility] ORS;  Service: General;  Laterality: Right;   RADIOLOGY WITH ANESTHESIA N/A 10/02/2019   Procedure: MICROWAVE THERMA ABLATION;  Surgeon: Aletta Edouard, MD;  Location: [facility] ORS;  Service: Radiology;  Laterality: N/A;   SALIVARY GLAND SURGERY Left 1983   with lymph node removal also   SALIVARY GLAND SURGERY     salivary gland removal    UPPER GI ENDOSCOPY  2008   VIDEO BRONCHOSCOPY WITH ENDOBRONCHIAL ULTRASOUND Right 01/24/2017   Procedure: VIDEO BRONCHOSCOPY  WITH ENDOBRONCHIAL ULTRASOUND;  Surgeon: [name], MD;  Location: [facility] ORS;  Service: Pulmonary;  Laterality: Right;    FAMILY HISTORY: Family History  Problem Relation Age of Onset   Breast cancer Maternal Aunt 61   Alzheimer's disease Maternal Aunt    Breast cancer Paternal Aunt        dx. in her late 63s   Breast cancer Maternal Grandmother        dx. in her 33s   Cervical cancer Maternal Grandmother 60   Colon cancer Maternal Grandmother        dx. in her 32s   Breast cancer Maternal Aunt 60   Alzheimer's disease Maternal Aunt    Breast cancer Paternal Aunt        dx. in her late 62s   Heart disease Paternal 36    Healthy Mother    Healthy Father    Colon cancer Father 41   Skin cancer Father    Heart attack Father    Heart attack Paternal  Grandfather    Skin cancer Paternal Aunt    Heart attack Paternal Uncle 41   Heart attack Paternal Uncle     ADVANCED DIRECTIVES (Y/N):  N  HEALTH MAINTENANCE: Social History   Tobacco Use   Smoking status: Never   Smokeless tobacco: Never  Vaping Use   Vaping Use: Never used  Substance Use Topics   Alcohol use: Yes    Alcohol/week: 7.0 standard drinks    Types: 7 Glasses of wine per week   Drug use: No     Colonoscopy:  PAP:  Bone density:  Lipid panel:  Allergies  Allergen Reactions   Other Other (See Comments)    Cat gut sutures get infected Cat gut sutures get infected   Penicillins Swelling and Rash    Did it involve swelling of the face/tongue/throat, SOB, or low BP? Unknown Did it involve sudden or severe rash/hives, skin peeling, or any reaction on the inside of your mouth or nose? No Did you need to seek medical attention at a hospital or doctor's office? Yes When did it last happen?      30 + years  If all above answers are "NO", may proceed with cephalosporin use.     Sulfa Antibiotics Nausea And Vomiting    Current Outpatient Medications  Medication Sig Dispense Refill   albuterol  (VENTOLIN HFA) 108 (90 Base) MCG/ACT inhaler Inhale 1-2 puffs into the lungs every 6 (six) hours as needed for wheezing or shortness of breath. 1 g 10   ALPRAZolam (XANAX) 0.5 MG tablet take 1-2 tablets by mouth at night as needed for sleep 60 tablet 3   ascorbic acid (VITAMIN C) 100 MG tablet Take 100 mg by mouth daily.      Biotin w/ Vitamins C & E (HAIR/SKIN/NAILS PO) Take 1 tablet by mouth daily.     Calcium Carb-Cholecalciferol 600-800 MG-UNIT TABS Take 2 tablets by mouth daily.     Cholecalciferol 125 MCG (5000 UT) capsule Take 5,000 Units by mouth daily.     clobetasol ointment (TEMOVATE) 7.79 % Apply 1 application topically 2 (two) times daily. Dry area and apply to mouth sores twice daily as needed for up to 2 weeks. 15 g 0   COLLAGEN PO Take 1,000 mg by mouth daily.     cyclobenzaprine (FLEXERIL) 5 MG tablet Take 1 tablet (5 mg total) by mouth 3 (three) times daily as needed for muscle spasms. 30 tablet 0   dexamethasone (DECADRON) 4 MG tablet Take 1 tablet (4 mg total) by mouth 2 (two) times daily. 30 tablet 0   fexofenadine (ALLEGRA) 180 MG tablet Take 180 mg by mouth daily as needed for allergies.      fluticasone (FLONASE) 50 MCG/ACT nasal spray Place 2 sprays into both nostrils daily as needed for allergies or rhinitis.     fluticasone-salmeterol (ADVAIR) 250-50 MCG/ACT AEPB Inhale 1 puff into the lungs 2 (two) times daily as needed for breathing issues. 60 each 8   Fluticasone-Salmeterol (ADVAIR) 250-50 MCG/DOSE AEPB INHALE 1 PUFF BY MOUTH 2 TIMES DAILY AS NEEDED FOR BREATHING ISSUES 60 each 10   hydrocortisone (ANUSOL-HC) 25 MG suppository Place 1 suppository (25 mg total) rectally 2 (two) times daily for 10 days 20 suppository 0   ibuprofen (ADVIL) 800 MG tablet TAKE 1 TABLET BY MOUTH EVERY 8 HOURS AS NEEDED FOR PAIN **ALWAYS TAKE WITH FOOD** 90 tablet 2   ipratropium (ATROVENT) 0.06 % nasal spray 2 puffs each nostril as needed for runny  nose every 8 hours 15 mL 6    lidocaine-prilocaine (EMLA) cream Apply 1 application topically as needed. Apply generously over the Mediport 45 minutes prior to chemotherapy. 30 g 0   metoprolol succinate (TOPROL-XL) 25 MG 24 hr tablet Take 1 tablet (25 mg total) by mouth once daily 30 tablet 6   ondansetron (ZOFRAN) 8 MG tablet Take 1 tablet (8 mg total) by mouth every 8 (eight) hours as needed for nausea or vomiting (start 3 days; after chemo). 40 tablet 1   osimertinib mesylate (TAGRISSO) 80 MG tablet Take 80 mg by mouth daily.     polyethylene glycol (MIRALAX / GLYCOLAX) packet Take 17 g by mouth daily as needed for mild constipation.     prochlorperazine (COMPAZINE) 10 MG tablet Take 1 tablet (10 mg total) by mouth every 6 (six) hours as needed for nausea or vomiting. 60 tablet 2   triamcinolone (KENALOG) 0.1 % paste Use as directed 1 application in the mouth or throat 2 (two) times daily. 15 g 12   Vitamin D, Ergocalciferol, (DRISDOL) 1.25 MG (50000 UNIT) CAPS capsule TAKE 1 CAPSULE (50,000 UNITS TOTAL) BY MOUTH ONCE A WEEK 12 capsule 3   zinc gluconate 50 MG tablet Take 50 mg by mouth daily.     ALPRAZolam (XANAX) 0.25 MG tablet Take 1 tablet (0.25 mg total) by mouth at bedtime as needed for anxiety. (Patient not taking: Reported on 01/08/2021) 30 tablet 0   B Complex Vitamins (VITAMIN B COMPLEX PO) Take 1 tablet by mouth daily.  (Patient not taking: Reported on 01/08/2021)     lansoprazole (PREVACID) 30 MG capsule TAKE 1 CAPSULE BY MOUTH TWICE DAILY (Patient taking differently: Take by mouth in the morning and at bedtime.) 180 capsule 3   magic mouthwash SOLN Take 5-10 mLs by mouth 4 (four) times daily as needed for mouth pain. (Patient not taking: Reported on 01/08/2021) 480 mL 0   metoprolol succinate (TOPROL-XL) 25 MG 24 hr tablet Take 0.5 tablets (12.5 mg total) by mouth once daily (Patient not taking: Reported on 01/08/2021) 15 tablet 6   No current facility-administered medications for this visit.   Facility-Administered  Medications Ordered in Other Visits  Medication Dose Route Frequency Provider Last Rate Last Admin   heparin lock flush 100 unit/mL  500 Units Intravenous Once Grayland Ormond, Kathlene November, MD       sodium chloride flush (NS) 0.9 % injection 10 mL  10 mL Intravenous PRN Lloyd Huger, MD       sodium chloride flush (NS) 0.9 % injection 10 mL  10 mL Intravenous PRN Lloyd Huger, MD   10 mL at 01/22/18 1419    OBJECTIVE: Vitals:   01/15/21 1104  BP: 123/75  Pulse: 88  Resp: 18  Temp: 98.6 F (37 C)  SpO2: 96%     Body mass index is 21.11 kg/m.    ECOG FS:1 - Symptomatic but completely ambulatory  General: Well-developed, well-nourished, no acute distress. Eyes: Pink conjunctiva, anicteric sclera. HEENT: Normocephalic, moist mucous membranes. Lungs: No audible wheezing or coughing.  Decreased breath sound bilaterally. Heart: Regular rate and rhythm. Abdomen: Soft, nontender, no obvious distention. Musculoskeletal: No edema, cyanosis, or clubbing. Neuro: Alert, answering all questions appropriately. Cranial nerves grossly intact. Skin: No rashes or petechiae noted. Psych: Normal affect.   LAB RESULTS:  Lab Results  Component Value Date   NA 132 (L) 01/15/2021   K 3.8 01/15/2021   CL 99 01/15/2021   CO2 23 01/15/2021  GLUCOSE 102 (H) 01/15/2021   BUN 17 01/15/2021   CREATININE 1.02 (H) 01/15/2021   CALCIUM 8.8 (L) 01/15/2021   PROT 6.9 01/15/2021   ALBUMIN 3.5 01/15/2021   AST 45 (H) 01/15/2021   ALT 59 (H) 01/15/2021   ALKPHOS 121 01/15/2021   BILITOT 0.6 01/15/2021   GFRNONAA 59 (L) 01/15/2021   GFRAA >60 03/23/2020    Lab Results  Component Value Date   WBC 3.9 (L) 01/15/2021   NEUTROABS 2.9 01/15/2021   HGB 9.2 (L) 01/15/2021   HCT 28.7 (L) 01/15/2021   MCV 90.5 01/15/2021   PLT 128 (L) 01/15/2021     RADIOGRAPHIC STUDIES: I have personally reviewed the radiological images as listed and agreed with the findings in the report.  MR Brain W Wo  Contrast  Result Date: 12/16/2020 CLINICAL DATA:  Lung cancer.  Assess for metastatic disease. EXAM: MRI HEAD WITHOUT AND WITH CONTRAST TECHNIQUE: Multiplanar, multiecho pulse sequences of the brain and surrounding structures were obtained without and with intravenous contrast. CONTRAST:  86m GADAVIST GADOBUTROL 1 MMOL/ML IV SOLN COMPARISON:  10/05/2020 FINDINGS: Brain: Diffusion imaging does not show any acute or subacute infarction or other cause of restricted diffusion. The brain shows mild age related atrophy without evidence of prior small or large vessel infarction. The brainstem and cerebellum are normal. Cerebral hemispheres show a dilated perivascular space in the external capsule on the left. No metastatic disease seen in the cerebellum. There is an insignificant venous angioma on the right. There is a 2 mm metastasis in the right posterior inferior temporal lobe, axial image 58. There is a 2 x 3 mm surface metastasis along the gyral surface of a right posterior frontal gyrus axial image 115. Second surface metastasis along the gyral surface in the right parietal region axial image 115. 2 mm metastasis along the gyral surface of a medial right frontal gyrus axial image 118. Subtle punctate focus of enhancement along the gyral surface in the left parietal lobe axial image 126, actually better seen on the prior exam. Vascular: Major vessels at the base of the brain show flow. Skull and upper cervical spine: Negative Sinuses/Orbits: Clear/normal Other: None IMPRESSION: Four tiny metastases noted in the right hemisphere as outlined above. One tiny metastasis in the left hemisphere, less conspicuous than on the study of 10/05/2020. No newly seen lesion. Electronically Signed   By: MNelson ChimesM.D.   On: 12/16/2020 19:21   CT CHEST ABDOMEN PELVIS W CONTRAST  Result Date: 12/17/2020 CLINICAL DATA:  Non-small cell lung cancer, status post right middle lobectomy and radiation therapy with metastatic  recurrence EXAM: CT CHEST, ABDOMEN, AND PELVIS WITH CONTRAST TECHNIQUE: Multidetector CT imaging of the chest, abdomen and pelvis was performed following the standard protocol during bolus administration of intravenous contrast. CONTRAST:  847mOMNIPAQUE IOHEXOL 300 MG/ML SOLN, additional oral enteric contrast COMPARISON:  Outside CT chest abdomen pelvis, 10/05/2020 FINDINGS: CT CHEST FINDINGS Cardiovascular: Right chest port catheter. Scattered aortic atherosclerosis. Normal heart size. No pericardial effusion. Mediastinum/Nodes: Unchanged post treatment appearance of ill-defined soft tissue about the right hilum and mediastinum, without discretely enlarged mediastinal lymph nodes. There are bulky left supraclavicular and cervical lymph nodes, largest measuring 3.1 x 2.2 cm, unchanged (series 2, image 7). Thyroid gland, trachea, and esophagus demonstrate no significant findings. Lungs/Pleura: Unchanged appearance of the right hemithorax, with extensive soft tissue and post treatment fibrosis about the right hilum, diffuse interlobular septal thickening and heterogeneous airspace opacity, as well as diffuse right-sided pleural thickening  and a small, loculated right pleural effusion. There are multiple small pulmonary nodules throughout the lungs, not significantly changed, an index nodule of the left upper lobe measuring 4 mm (series 3, image 56) and an index nodule of the medial left lung base measuring 5 mm (series 3, image 117). Musculoskeletal: No chest wall mass or suspicious bone lesions identified. CT ABDOMEN PELVIS FINDINGS Hepatobiliary: Numerous low-attenuation lesions throughout the liver are increased in size compared to prior examination, an index lesion of the left lobe of the liver measuring 3.1 x 2.6 cm, previously 2.2 x 1.9 cm (series 2, image 56). An index lesion of the peripheral liver dome measures 2.6 x 2.6 cm, previously 2.4 x 2.1 cm (series 2, image 52). No gallstones, gallbladder wall  thickening, or biliary dilatation. Pancreas: Unremarkable. No pancreatic ductal dilatation or surrounding inflammatory changes. Spleen: Normal in size without significant abnormality. Adrenals/Urinary Tract: Adrenal glands are unremarkable. There is a large exophytic mass of the lateral midportion of the left kidney, slightly increased in size, measuring 5.4 x 3.5 cm, previously 4.7 x 3.3 cm (series 2, image 71). Bladder is unremarkable. Stomach/Bowel: Stomach is within normal limits. Appendix appears normal. No evidence of bowel wall thickening, distention, or inflammatory changes. Vascular/Lymphatic: No significant vascular findings are present. Redemonstrated bulky retroperitoneal lymphadenopathy, largest node conglomerate in the left retroperitoneum measuring 4.3 x 3.7 cm, not significantly changed (series 2, image 70). Reproductive: Status post hysterectomy. Other: No abdominal wall hernia or abnormality. No abdominopelvic ascites. Musculoskeletal: No acute or significant osseous findings. IMPRESSION: 1. Unchanged post treatment appearance of the right hemithorax, with extensive soft tissue and post treatment fibrosis about the right hilum, diffuse interlobular septal thickening and heterogeneous airspace opacity, as well as diffuse right-sided pleural thickening and a small, loculated right pleural effusion. Findings are consistent with advanced lung malignancy including pleural and lymphangitic disease. 2. There are multiple small pulmonary nodules throughout the lungs, not significantly changed, consistent with pulmonary metastatic disease. 3. Numerous low-attenuation lesions throughout the liver are increased in size compared to prior examination, findings consistent with worsened hepatic metastatic disease. 4. Large exophytic mass of the lateral midportion of the left kidney, increased in size, consistent with worsened metastasis. 5. Redemonstrated bulky retroperitoneal lymphadenopathy, not significantly  changed. 6. Overall constellation of findings with significant evidence of metastatic disease progression in the abdomen and pelvis, however findings in the chest are stable. Aortic Atherosclerosis (ICD10-I70.0). Electronically Signed   By: Eddie Candle M.D.   On: 12/17/2020 09:21   DG Fluoro Guide CV Line Right  Result Date: 11/12/2020 INDICATION: 70 year old female with history of non-small cell lung cancer and right internal jugular vein indwelling Port-A-Cath placed by Dr. Magdalen Spatz on 02/09/2017. The port has functioned perfectly until recently with inability to aspirate. EXAM: FLUOROSCOPIC GUIDED PORT A CATHETER CHECK MEDICATIONS: None. CONTRAST:  None FLUOROSCOPY TIME:  0.3 minutes (40.9 mGy) COMPLICATIONS: None immediate. TECHNIQUE: The procedure, risks, benefits, and alternatives were explained to the patient and informed written consent was obtained. A timeout was performed prior to the initiation of the procedure. The patient's chest port a catheter was accessed by the infusion center prior to arrival. The patient was placed supine on the fluoroscopy table. A preprocedural spot fluoroscopic image was obtained of the chest in existing port a catheter. Note was made of difficulty aspirating blood from the port a catheter. Contrast was injected via the Port a catheter and images were reviewed. The Port a catheter was flushed with a heparin dwell and de  accessed. A dressing was placed. The patient tolerated the procedure well without immediate postprocedural complication. FINDINGS: Unchanged positioning of anterior chest wall right internal jugular vein approach port a catheter with tip projected over the expected location of the central SVC. There was difficulty aspirating blood from the port a catheter. Contrast injection demonstrated the presence of a fibrin sheath about the distal end of the Port a catheter which was located within the central aspect of the SVC. There is no evidence of catheter kink or  fracture. No contrast extravasation. IMPRESSION: Difficulty aspirating of the Port a catheter is secondary to the presence of a fibrin sheath. 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 Above findings were discussed and the following options were provided to the patient: 1. Maintain the Port a catheter as is, realizing it may be difficulty for blood draws, but could be used for medication administration. 2. Attempt to perform a fibrin sheath stripping from the port a catheter tip via a right common femoral vein approach. 3. Replace the subclavian vein approach port a catheter with an IJ approach catheter with tip terminating within the superior aspect of the right atrium. After discussion of all potential treatment option, the patient has elected to pursue port stripping. This was discussed with Dr. Grayland Ormond who is in agreement with this plan of care. As such, the procedure will be scheduled for 11/14/19. Ruthann Cancer, MD [facility] Radiology Electronically Signed   By: Ruthann Cancer MD   On: 11/12/2020 14:48   IR Port Repair Central Venous Access Device  Result Date: 11/13/2020 INDICATION: 70 year old female with history of non-small cell lung cancer and indwelling right internal jugular vein Port-A-Cath placed by Dr. Genevive Bi on 02/09/2017 with inability to aspirate from the port due to fibrin sheath identified on port check yesterday. EXAM: 1. Ultrasound-guided access of the right common femoral vein. 2. Intravascular ultrasound evaluation of the central veins. 3. Port-A-Cath stripping. COMPARISON:  11/12/2020 MEDICATIONS: None. ANESTHESIA/SEDATION: Moderate (conscious) sedation was employed during this procedure. A total of Versed 2 mg and Fentanyl 100 mcg was administered intravenously. Moderate Sedation Time: 43 minutes. The patient's level of consciousness and vital signs were monitored continuously by radiology nursing throughout the procedure under  my direct supervision. CONTRAST:  None FLUOROSCOPY TIME:  14.5 minutes minutes, (22.0 mGy) COMPLICATIONS: None immediate. PROCEDURE: The procedure, risks, benefits, and alternatives were explained to the patient. Questions regarding the procedure were encouraged and answered. The patient understands and consents to the procedure. The right neck and right groin were prepped with chlorhexidine in a sterile fashion, and a sterile drape was applied covering the operative field. Maximum barrier sterile technique with sterile gowns and gloves were used for the procedure. A timeout was performed prior to the initiation of the procedure. The indwelling Port-A-Cath was then accessed without difficulty. The port once again was able to be flushed but not aspirated. Next, ultrasound was used to study the right common femoral vein. The right common femoral vein was widely patent and free of internal echoes. Vena puncture site was planned. Subdermal Local anesthesia was administered 1% lidocaine at the planned needle entry site. A small skin nick was made. Under direct ultrasound visualization, the right common femoral vein was accessed with ultrasound guidance. A permanent image was stored and placed in the imaging record. A micropuncture set was then use to dilate up to a 0.035" system and a Rosen wire was directed to the inferior vena cava under fluoroscopic guidance.  Serial dilation was performed and an 8 [place], 65 cm sheath was then placed over the Rosen wire with the tip in the perihepatic inferior vena cava. A Glidewire and angled tip catheter were then used to catheterize the superior vena cava. The Glidewire was exchanged for the [facility] wire and the catheter was removed. An intravascular ultrasound was then advanced over the wire to evaluate the central veins. The central inferior vena cava is patent. The right atrium is widely patent. The superior vena cava is patent but small caliber and ovoid, likely secondary to  extrinsic compression from pulmonary mass. There is an echogenic thrombus affixed to the inferior aspect of the indwelling Port-A-Cath catheter tip which is positioned in the superior aspect of the superior vena cava. Peripheral to the port catheter tip, the superior vena cava and right brachiocephalic vein are widely patent and normal in caliber. The intravascular ultrasound was then removed and a 6 French gooseneck snare was directed to the superior vena cava. After multiple passes, the gooseneck snare was unable to capture the Port-A-Cath tip. Therefore, a 6 [place], tri lobed ensnare device was then used which was able to capture the catheter tip. Three stripping passes were then performed which in turn reposition the catheter tip approximately 6 cm inferior to its previous position. The Port-A-Cath in aspirated and flushed without difficulty. Intravascular ultrasound was then placed over the wire for post stripping evaluation. There is no evidence of residual fibrin sheath. The catheter tip was floating freely within the mid to central superior vena cava. No traditional venogram was performed due to adequate evaluation with intravascular ultrasound and current global iodinated contrast shortage. The catheters and wires were then removed. Manual compression was applied at the groin site for 5 minutes to achieve hemostasis. The catheter was then flushed/blocked with heparinized saline and the indwelling Huber needle was removed. The patient tolerated procedure well without immediate complication. FINDINGS: Focal fibrin sheath about the catheter tip. Catheter tip in the superior aspect of the superior vena cava. IMPRESSION: Successful fluoroscopic and intravascular ultrasound guided port catheter tip stripping with removal of fibrin sheath. The catheter tip was repositioned approximately 6 cm inferior from prior indwelling position, now within the central aspect of the SVC. PLAN: Proceed with Port-A-Cath use per  usual. Ruthann Cancer, MD [facility] Electronically Signed   By: Ruthann Cancer MD   On: 11/13/2020 12:29      #Assessment and plan  1. Recurrent non-small cell lung cancer ([place])   2. Brain metastasis ([place])   3. Encounter for antineoplastic chemotherapy   4. Anemia due to antineoplastic chemotherapy   5. Thrombocytopenia ([place])   6. 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 Counts are acceptable to proceed with cycle 5-day 8 gemcitabine treatment. Patient reports that she has decided to take a chemo break.  She has booked flights to [facility] to spend a month with her son.  She is coming back on 03/02/2021.  I will send a message to Dr. Ishmael Holter team and I will defer further treatment plan to Dr. Cecille Aver.  #Brain metastasis, on palliative radiation to her brain. On dexamethasone.   #Chemotherapy-induced diarrhea, continue Lomotil as instructed. Symptoms are stable.  #Chemotherapy-induced anemia, hemoglobin stable.  Monitor. #Transaminitis, slightly worsened compared to last visit.  Most likely chemotherapy-induced.  Monitor. #Thrombocytopenia, chemotherapy-induced.  Monitor.  Follow-up appointment, to be determined as patient prefers to  have a chemo break.  Defer to Dr. Cecille Aver.  Patient expressed understanding and was in agreement with this plan. She also understands that She can call clinic at any time with any questions, concerns, or complaints.    Earlie Server, MD   01/15/2021 3:21 PM

## 2021-01-15 NOTE — Patient Instructions (Signed)
[facility]  Discharge Instructions: Thank you for choosing [facility] to provide your oncology and hematology care.  If you have a lab appointment with the [facility], please go directly to the [facility] and check in at the registration area.  Wear comfortable clothing and clothing appropriate for easy access to any Portacath or PICC line.   We strive to give you quality time with your provider. You may need to reschedule your appointment if you arrive late (15 or more minutes).  Arriving late affects you and other patients whose appointments are after yours.  Also, if you miss three or more appointments without notifying the office, you may be dismissed from the clinic at the provider's discretion.      For prescription refill requests, have your pharmacy contact our office and allow 72 hours for refills to be completed.    Today you received the following chemotherapy and/or immunotherapy agents GemcitabineGemcitabine injection What is this medication? GEMCITABINE (jem SYE ta been) is a chemotherapy drug. This medicine is used to treat many types of cancer like breast cancer, lung cancer, pancreatic cancer,and ovarian cancer. This medicine may be used for other purposes; ask your health care provider orpharmacist if you have questions. COMMON BRAND NAME(S): Gemzar, Infugem What should I tell my care team before I take this medication? They need to know if you have any of these conditions: blood disorders infection kidney disease liver disease lung or breathing disease, like asthma recent or ongoing radiation therapy an unusual or allergic reaction to gemcitabine, other chemotherapy, other medicines, foods, dyes, or preservatives pregnant or trying to get pregnant breast-feeding How should I use this medication? This drug is given as an infusion into a vein. It is administered in a hospitalor clinic by a specially trained health  care professional. Talk to your pediatrician regarding the use of this medicine in children.Special care may be needed. Overdosage: If you think you have taken too much of this medicine contact apoison control center or emergency room at once. NOTE: This medicine is only for you. Do not share this medicine with others. What if I miss a dose? It is important not to miss your dose. Call your doctor or health careprofessional if you are unable to keep an appointment. What may interact with this medication? medicines to increase blood counts like filgrastim, pegfilgrastim, sargramostim some other chemotherapy drugs like cisplatin vaccines Talk to your doctor or health care professional before taking any of thesemedicines: acetaminophen aspirin ibuprofen ketoprofen naproxen This list may not describe all possible interactions. Give your health care provider a list of all the medicines, herbs, non-prescription drugs, or dietary supplements you use. Also tell them if you smoke, drink alcohol, or use illegaldrugs. Some items may interact with your medicine. What should I watch for while using this medication? Visit your doctor for checks on your progress. This drug may make you feel generally unwell. This is not uncommon, as chemotherapy can affect healthy cells as well as cancer cells. Report any side effects. Continue your course oftreatment even though you feel ill unless your doctor tells you to stop. In some cases, you may be given additional medicines to help with side effects.Follow all directions for their use. Call your doctor or health care professional for advice if you get a fever, chills or sore throat, or other symptoms of a cold or flu. Do not treat yourself. This drug decreases your body's ability to fight infections. Try toavoid  being around people who are sick. This medicine may increase your risk to bruise or bleed. Call your doctor orhealth care professional if you notice any unusual  bleeding. Be careful brushing and flossing your teeth or using a toothpick because you may get an infection or bleed more easily. If you have any dental work done,tell your dentist you are receiving this medicine. Avoid taking products that contain aspirin, acetaminophen, ibuprofen, naproxen, or ketoprofen unless instructed by your doctor. These medicines may hide afever. Do not become pregnant while taking this medicine or for 6 months after stopping it. Women should inform their doctor if they wish to become pregnant or think they might be pregnant. Men should not father a child while taking this medicine and for 3 months after stopping it. There is a potential for serious side effects to an unborn child. Talk to your health care professional or pharmacist for more information. Do not breast-feed an infant while takingthis medicine or for at least 1 week after stopping it. Men should inform their doctors if they wish to father a child. This medicine may lower sperm counts. Talk with your doctor or health care professional ifyou are concerned about your fertility. What side effects may I notice from receiving this medication? Side effects that you should report to your doctor or health care professionalas soon as possible: allergic reactions like skin rash, itching or hives, swelling of the face, lips, or tongue breathing problems pain, redness, or irritation at site where injected signs and symptoms of a dangerous change in heartbeat or heart rhythm like chest pain; dizziness; fast or irregular heartbeat; palpitations; feeling faint or lightheaded, falls; breathing problems signs of decreased platelets or bleeding - bruising, pinpoint red spots on the skin, black, tarry stools, blood in the urine signs of decreased red blood cells - unusually weak or tired, feeling faint or lightheaded, falls signs of infection - fever or chills, cough, sore throat, pain or difficulty passing urine signs and symptoms  of kidney injury like trouble passing urine or change in the amount of urine signs and symptoms of liver injury like dark yellow or brown urine; general ill feeling or flu-like symptoms; light-colored stools; loss of appetite; nausea; right upper belly pain; unusually weak or tired; yellowing of the eyes or skin swelling of ankles, feet, hands Side effects that usually do not require medical attention (report to yourdoctor or health care professional if they continue or are bothersome): constipation diarrhea hair loss loss of appetite nausea rash vomiting This list may not describe all possible side effects. Call your doctor for medical advice about side effects. You may report side effects to FDA at1-800-FDA-1088. Where should I keep my medication? This drug is given in a hospital or clinic and will not be stored at home. NOTE: This sheet is a summary. It may not cover all possible information. If you have questions about this medicine, talk to your doctor, pharmacist, orhealth care provider.  2022 Elsevier/Gold Standard (2017-09-13 18:06:11)    To help prevent nausea and vomiting after your treatment, we encourage you to take your nausea medication as directed.  BELOW ARE SYMPTOMS THAT SHOULD BE REPORTED IMMEDIATELY: *FEVER GREATER THAN 100.4 F (38 C) OR HIGHER *CHILLS OR SWEATING *NAUSEA AND VOMITING THAT IS NOT CONTROLLED WITH YOUR NAUSEA MEDICATION *UNUSUAL SHORTNESS OF BREATH *UNUSUAL BRUISING OR BLEEDING *URINARY PROBLEMS (pain or burning when urinating, or frequent urination) *BOWEL PROBLEMS (unusual diarrhea, constipation, pain near the anus) TENDERNESS IN MOUTH AND THROAT WITH OR  WITHOUT PRESENCE OF ULCERS (sore throat, sores in mouth, or a toothache) UNUSUAL RASH, SWELLING OR PAIN  UNUSUAL VAGINAL DISCHARGE OR ITCHING   Items with * indicate a potential emergency and should be followed up as soon as possible or go to the Emergency Department if any problems should  occur.  Please show the CHEMOTHERAPY ALERT CARD or IMMUNOTHERAPY ALERT CARD at check-in to the Emergency Department and triage nurse.  Should you have questions after your visit or need to cancel or reschedule your appointment, please contact [facility]  [phone] and follow the prompts.  Office hours are 8:00 a.m. to 4:30 p.m. Monday - Friday. Please note that voicemails left after 4:00 p.m. may not be returned until the following business day.  We are closed weekends and major holidays. You have access to a nurse at all times for urgent questions. Please call the main number to the clinic [phone] and follow the prompts.  For any non-urgent questions, you may also contact your provider using MyChart. We now offer e-Visits for anyone 63 and older to request care online for non-urgent symptoms. For details visit [email or web address].   Also download the MyChart app! Go to the app store, search "MyChart", open the app, select [facility], and log in with your MyChart username and password.  Due to Covid, a mask is required upon entering the hospital/clinic. If you do not have a mask, one will be given to you upon arrival. For doctor visits, patients may have 1 support person aged 63 or older with them. For treatment visits, patients cannot have anyone with them due to current Covid guidelines and our immunocompromised population.

## 2021-01-18 ENCOUNTER — Ambulatory Visit
Admission: RE | Admit: 2021-01-18 | Discharge: 2021-01-18 | Disposition: A | Discharge status: Home / Self Care | Payer: Medicare Other | Source: Ambulatory Visit | Attending: Radiation Oncology | Admitting: Radiation Oncology

## 2021-01-18 DIAGNOSIS — Z510 Encounter for antineoplastic radiation therapy: Secondary | ICD-10-CM | POA: Diagnosis not present

## 2021-01-19 ENCOUNTER — Ambulatory Visit
Admission: RE | Admit: 2021-01-19 | Discharge: 2021-01-19 | Disposition: A | Discharge status: Home / Self Care | Payer: Medicare Other | Source: Ambulatory Visit | Attending: Radiation Oncology | Admitting: Radiation Oncology

## 2021-01-19 DIAGNOSIS — Z510 Encounter for antineoplastic radiation therapy: Secondary | ICD-10-CM | POA: Diagnosis not present

## 2021-01-20 ENCOUNTER — Ambulatory Visit
Admission: RE | Admit: 2021-01-20 | Discharge: 2021-01-20 | Disposition: A | Discharge status: Home / Self Care | Payer: Medicare Other | Source: Ambulatory Visit | Attending: Radiation Oncology | Admitting: Radiation Oncology

## 2021-01-20 DIAGNOSIS — Z510 Encounter for antineoplastic radiation therapy: Secondary | ICD-10-CM | POA: Diagnosis not present

## 2021-01-21 ENCOUNTER — Ambulatory Visit
Admission: RE | Admit: 2021-01-21 | Discharge: 2021-01-21 | Disposition: A | Discharge status: Home / Self Care | Payer: Medicare Other | Source: Ambulatory Visit | Attending: Radiation Oncology | Admitting: Radiation Oncology

## 2021-01-21 DIAGNOSIS — Z510 Encounter for antineoplastic radiation therapy: Secondary | ICD-10-CM | POA: Diagnosis not present

## 2021-01-22 NOTE — Progress Notes (Signed)
MyChart message sent to patient requesting she let us know when she has returned.

## 2021-01-25 ENCOUNTER — Other Ambulatory Visit: Payer: Self-pay

## 2021-01-25 MED FILL — Ibuprofen Tab 800 MG: ORAL | 30 days supply | Qty: 90 | Fill #0 | Status: AC

## 2021-01-26 ENCOUNTER — Other Ambulatory Visit: Payer: Self-pay

## 2021-01-26 ENCOUNTER — Telehealth: Payer: Self-pay | Admitting: Internal Medicine

## 2021-01-26 MED ORDER — FLUTICASONE-SALMETEROL 250-50 MCG/ACT IN AEPB: 1.0000 | INHALATION_SPRAY | Freq: Two times a day (BID) | RESPIRATORY_TRACT | 11 refills | Status: AC | PRN

## 2021-01-26 NOTE — Telephone Encounter (Signed)
Patient dropped off patient assistance forms for Advair.  Rx has been printed and placed in Dr. Mortimer Fries look at folder for signature.

## 2021-01-27 ENCOUNTER — Other Ambulatory Visit: Payer: Self-pay

## 2021-01-28 ENCOUNTER — Other Ambulatory Visit: Payer: Self-pay

## 2021-01-28 ENCOUNTER — Ambulatory Visit
Admission: RE | Admit: 2021-01-28 | Discharge: 2021-01-28 | Disposition: A | Discharge status: Home / Self Care | Payer: Medicare Other | Source: Ambulatory Visit | Attending: Interventional Radiology | Admitting: Interventional Radiology

## 2021-01-28 DIAGNOSIS — C3490 Malignant neoplasm of unspecified part of unspecified bronchus or lung: Secondary | ICD-10-CM

## 2021-01-28 HISTORY — PX: IR RADIOLOGIST EVAL & MGMT: IMG5224

## 2021-01-28 NOTE — Telephone Encounter (Signed)
Application and Rx have been faxed to [facility], Received successful fax confirmation. Patient is aware and voiced her understanding.  Nothing further needed.

## 2021-01-28 NOTE — Progress Notes (Signed)
Chief Complaint: Patient was consulted remotely today (TeleHealth) for follow up after left renal ablation.  History of Present Illness: Lauren Page is a 70 y.o. 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  She had a good response by imaging initially but subsequently developed recurrent disease in the chest followed by metastatic disease to the brain, liver, retroperitoneal lymph nodes and new and recurrent metastatic disease to the left kidney. She has received multiple systemic agents, brain radiation, and had been in a clinical trial at [facility]. She recently decided to take a break from gemcitabine treatment to travel to [facility] to see her son next week and plans to stay there for 4 weeks. She does have fatigue, weakness and some occasional pain overlying the liver related to her hepatic metastatic disease.  Past Medical History:  Diagnosis Date   Abnormal echocardiogram    Acid reflux    Allergic rhinitis    Anemia    HAD TO HAVE IRON INFUSIONS BACK IN 2015   Arthritis    bil hands and back   Asthma    Bloody discharge from left nipple    Cancer of right lung ([place]) 2015   lung cancer - chemo, Dr Genevive Bi removed RML Lobectomy.    Colon polyps    COPD (chronic obstructive pulmonary disease) (HCC)    Dyspnea    WITH EXERTION   Family history of breast cancer    Family history of cervical cancer    Family history of colon cancer    Family history of skin cancer    Hypertension    Malignant neoplasm of middle lobe of right lung ([place])    Mitral valve prolapse    Mitral valve prolapse    Non-small cell lung cancer (HCC)    Personal history of chemotherapy    F/U Lung Cancer   Personal history of radiation therapy    Lung cancer   Primary cancer of right middle lobe of lung ([place]) 03/11/2016   Squamous cell carcinoma of skin 07/07/2009   right supraclavicular/EDC   Squamous cell carcinoma of skin 11/04/2019    Left pretibial. WD SCC.   Stage IV adenocarcinoma of lung, unspecified laterality [facility])     Past Surgical History:  Procedure Laterality Date   ABDOMINAL HYSTERECTOMY     BAND HEMORRHOIDECTOMY     BREAST BIOPSY Left 03/01/2019   Procedure: LEFT BREAST EXCISIONAL BIOPSY WITH NEEDLE LOCALIZATION;  Surgeon: Herbert Pun, MD;  Location: [facility] ORS;  Service: General;  Laterality: Left;   BREAST EXCISIONAL BIOPSY Left 1994 (-), 2004 (-)   benign   BREAST EXCISIONAL BIOPSY Left 03/01/2019   - INTRADUCTAL PAPILLOMA WITH SCLEROSIS.    COLONOSCOPY  2008    Dr. Donnella Sham   COLONOSCOPY WITH PROPOFOL N/A 04/12/2019   Procedure: COLONOSCOPY WITH PROPOFOL;  Surgeon: Lollie Sails, MD;  Location: [facility] ENDOSCOPY;  Service: Endoscopy;  Laterality: N/A;   IR PORT REPAIR CENTRAL VENOUS ACCESS DEVICE  11/13/2020   IR RADIOLOGIST EVAL & MGMT  08/28/2019   IR RADIOLOGIST EVAL & MGMT  10/30/2019   IR RADIOLOGIST EVAL & MGMT  11/26/2019   KNEE SURGERY  2007   LUNG LOBECTOMY Right    middle lobe   PORTACATH PLACEMENT Right 02/09/2017   Procedure: INSERTION PORT-A-CATH;  Surgeon: Nestor Lewandowsky, MD;  Location: [facility] ORS;  Service: General;  Laterality: Right;   RADIOLOGY WITH ANESTHESIA N/A 10/02/2019   Procedure:  MICROWAVE THERMA ABLATION;  Surgeon: Aletta Edouard, MD;  Location: [facility] ORS;  Service: Radiology;  Laterality: N/A;   SALIVARY GLAND SURGERY Left 1983   with lymph node removal also   SALIVARY GLAND SURGERY     salivary gland removal    UPPER GI ENDOSCOPY  2008   VIDEO BRONCHOSCOPY WITH ENDOBRONCHIAL ULTRASOUND Right 01/24/2017   Procedure: VIDEO BRONCHOSCOPY WITH ENDOBRONCHIAL ULTRASOUND;  Surgeon: [name], MD;  Location: [facility] ORS;  Service: Pulmonary;  Laterality: Right;    Allergies: Other, Penicillins, and Sulfa antibiotics  Medications: Prior to Admission medications   Medication Sig Start Date End Date Taking? Authorizing Provider  albuterol (VENTOLIN HFA) 108 (90 Base)  MCG/ACT inhaler Inhale 1-2 puffs into the lungs every 6 (six) hours as needed for wheezing or shortness of breath. 05/22/20   Flora Lipps, MD  ALPRAZolam Duanne Moron) 0.25 MG tablet Take 1 tablet (0.25 mg total) by mouth at bedtime as needed for anxiety. Patient not taking: Reported on 01/08/2021 10/20/20   Jacquelin Hawking, NP  ALPRAZolam Duanne Moron) 0.5 MG tablet take 1-2 tablets by mouth at night as needed for sleep 11/11/20     ascorbic acid (VITAMIN C) 100 MG tablet Take 100 mg by mouth daily.     [provider]  B Complex Vitamins (VITAMIN B COMPLEX PO) Take 1 tablet by mouth daily.  Patient not taking: Reported on 01/08/2021    [provider]  Biotin w/ Vitamins C & E (HAIR/SKIN/NAILS PO) Take 1 tablet by mouth daily.    [provider]  Calcium Carb-Cholecalciferol 600-800 MG-UNIT TABS Take 2 tablets by mouth daily.    [provider]  Cholecalciferol 125 MCG (5000 UT) capsule Take 5,000 Units by mouth daily.    [provider]  clobetasol ointment (TEMOVATE) 8.52 % Apply 1 application topically 2 (two) times daily. Dry area and apply to mouth sores twice daily as needed for up to 2 weeks. 03/02/20   Lloyd Huger, MD  COLLAGEN PO Take 1,000 mg by mouth daily.    [provider]  cyclobenzaprine (FLEXERIL) 5 MG tablet Take 1 tablet (5 mg total) by mouth 3 (three) times daily as needed for muscle spasms. 08/20/19   Lloyd Huger, MD  dexamethasone (DECADRON) 4 MG tablet Take 1 tablet (4 mg total) by mouth 2 (two) times daily. 12/30/20   Noreene Filbert, MD  fexofenadine (ALLEGRA) 180 MG tablet Take 180 mg by mouth daily as needed for allergies.     [provider]  fluticasone (FLONASE) 50 MCG/ACT nasal spray Place 2 sprays into both nostrils daily as needed for allergies or rhinitis.    [provider]  fluticasone-salmeterol (ADVAIR) 250-50 MCG/ACT AEPB Inhale 1 puff into the lungs 2 (two) times daily as needed for  breathing issues. 01/26/21   Flora Lipps, MD  Fluticasone-Salmeterol (ADVAIR) 250-50 MCG/DOSE AEPB INHALE 1 PUFF BY MOUTH 2 TIMES DAILY AS NEEDED FOR BREATHING ISSUES 05/22/20 05/22/21  Flora Lipps, MD  hydrocortisone (ANUSOL-HC) 25 MG suppository Place 1 suppository (25 mg total) rectally 2 (two) times daily for 10 days 12/25/20     ibuprofen (ADVIL) 800 MG tablet TAKE 1 TABLET BY MOUTH EVERY 8 HOURS AS NEEDED FOR PAIN **ALWAYS TAKE WITH FOOD** 07/29/20 07/29/21  Voncille Lo, PA-C  ipratropium (ATROVENT) 0.06 % nasal spray 2 puffs each nostril as needed for runny nose every 8 hours 01/12/21     lansoprazole (PREVACID) 30 MG capsule TAKE 1 CAPSULE BY MOUTH TWICE DAILY  Patient taking differently: Take by mouth in the morning and at bedtime. 05/25/20 05/25/21  [name], MD  lidocaine-prilocaine (EMLA) cream Apply 1 application topically as needed. Apply generously over the Mediport 45 minutes prior to chemotherapy. 02/13/17   Cammie Sickle, MD  magic mouthwash SOLN Take 5-10 mLs by mouth 4 (four) times daily as needed for mouth pain. Patient not taking: Reported on 01/08/2021 11/25/19   Lloyd Huger, MD  metoprolol succinate (TOPROL-XL) 25 MG 24 hr tablet Take 0.5 tablets (12.5 mg total) by mouth once daily Patient not taking: Reported on 01/08/2021 12/11/20     metoprolol succinate (TOPROL-XL) 25 MG 24 hr tablet Take 1 tablet (25 mg total) by mouth once daily 01/07/21     ondansetron (ZOFRAN) 8 MG tablet Take 1 tablet (8 mg total) by mouth every 8 (eight) hours as needed for nausea or vomiting (start 3 days; after chemo). 02/13/17   Cammie Sickle, MD  osimertinib mesylate (TAGRISSO) 80 MG tablet Take 80 mg by mouth daily.    [provider]  polyethylene glycol (MIRALAX / GLYCOLAX) packet Take 17 g by mouth daily as needed for mild constipation.    [provider]  prochlorperazine (COMPAZINE) 10 MG tablet Take 1 tablet (10 mg total) by mouth every 6 (six)  hours as needed for nausea or vomiting. 11/05/20   Lloyd Huger, MD  triamcinolone (KENALOG) 0.1 % paste Use as directed 1 application in the mouth or throat 2 (two) times daily. 11/25/19   Lloyd Huger, MD  Vitamin D, Ergocalciferol, (DRISDOL) 1.25 MG (50000 UNIT) CAPS capsule TAKE 1 CAPSULE (50,000 UNITS TOTAL) BY MOUTH ONCE A WEEK 04/29/20 04/29/21  [name], MD  zinc gluconate 50 MG tablet Take 50 mg by mouth daily.    [provider]     Family History  Problem Relation Age of Onset   Breast cancer Maternal Aunt 36   Alzheimer's disease Maternal Aunt    Breast cancer Paternal Aunt        dx. in her late 11s   Breast cancer Maternal Grandmother        dx. in her 67s   Cervical cancer Maternal Grandmother 60   Colon cancer Maternal Grandmother        dx. in her 15s   Breast cancer Maternal Aunt 60   Alzheimer's disease Maternal Aunt    Breast cancer Paternal Aunt        dx. in her late 81s   Heart disease Paternal 50    Healthy Mother    Healthy Father    Colon cancer Father 52   Skin cancer Father    Heart attack Father    Heart attack Paternal Grandfather    Skin cancer Paternal Aunt    Heart attack Paternal Uncle 60   Heart attack Paternal Uncle     Social History   Socioeconomic History   Marital status: Married    Spouse name: Not on file   Number of children: Not on file   Years of education: Not on file   Highest education level: Not on file  Occupational History   Not on file  Tobacco Use   Smoking status: Never   Smokeless tobacco: Never  Vaping Use   Vaping Use: Never used  Substance and Sexual Activity   Alcohol use: Yes    Alcohol/week: 7.0 standard drinks    Types: 7 Glasses of wine per week   Drug  use: No   Sexual activity: Not on file  Other Topics Concern   Not on file  Social History Narrative   Not on file   Social Determinants of Health   Financial Resource Strain: Not on file  Food Insecurity: Not on  file  Transportation Needs: Not on file  Physical Activity: Not on file  Stress: Not on file  Social Connections: Not on file    ECOG Status: 2 - Symptomatic, <50% confined to bed  Review of Systems  Review of Systems: A 12 point ROS discussed and pertinent positives are indicated in the HPI above.  All other systems are negative.  Physical Exam No direct physical exam was performed (except for noted visual exam findings with Video Visits).    Vital Signs: There were no vitals taken for this visit.  Imaging: No results found.  Labs:  CBC: Recent Labs    12/17/20 1050 12/24/20 1036 01/08/21 1056 01/15/21 1049  WBC 5.9 1.5* 7.0 3.9*  HGB 9.7* 8.6* 9.1* 9.2*  HCT 29.9* 26.5* 28.6* 28.7*  PLT 351 228 335 128*    COAGS: No results for input(s): INR, APTT in the last 8760 hours.  BMP: Recent Labs    02/17/20 1414 03/23/20 1050 04/14/20 1058 12/17/20 1050 12/24/20 1036 01/08/21 1056 01/15/21 1049  NA 134* 136   < > 133* 134* 135 132*  K 4.1 4.0   < > 4.1 4.2 4.4 3.8  CL 101 103   < > 99 101 101 99  CO2 24 24   < > 25 25 25 23   GLUCOSE 88 101*   < > 105* 105* 107* 102*  BUN 16 15   < > 17 15 20 17   CALCIUM 8.4* 8.6*   < > 8.8* 8.8* 8.9 8.8*  CREATININE 0.92 0.75   < > 1.11* 1.05* 1.12* 1.02*  GFRNONAA >60 >60   < > 53* 57* 53* 59*  GFRAA >60 >60  --   --   --   --   --    < > = values in this interval not displayed.    LIVER FUNCTION TESTS: Recent Labs    12/17/20 1050 12/24/20 1036 01/08/21 1056 01/15/21 1049  BILITOT 0.2* 0.5 0.4 0.6  AST 27 40 26 45*  ALT 17 36 15 59*  ALKPHOS 136* 144* 136* 121  PROT 7.3 7.1 6.7 6.9  ALBUMIN 3.4* 3.1* 3.2* 3.5     Assessment and Plan:  Given the current extent of metastatic disease involving her left kidney, I did not recommend further ablation of the left kidney.  She currently is not experiencing any left flank pain or hematuria.  In reviewing the most recent CT, there is not only a dominant exophytic  cortical mass posteriorly but also a more superior anterior mass and an infiltrative component of mass extending deeper into the anterior interpolar sinus region with tumor extension into an anterior division of the left renal vein.  Lauren Page did ask if there were any local IR treatment options for her metastatic disease involving the liver.  Given extensive metastatic involvement throughout both lobes, she is not a candidate for ablation.  I did mention that palliative Y-90 radioembolization may be an option if hepatic metastatic disease were to become significantly symptomatic.  However, this would not be curative, may not have much impact on survival and there may be a limited window for treatment depending on her performance status and liver function in the future.  She will discuss treatment options with Dr. Grayland Ormond once she returns from her trip to [place].  Electronically Signed: Azzie Roup 01/28/2021, 4:39 PM    I spent a total of 15 Minutes in remote  clinical consultation, greater than 50% of which was counseling/coordinating care for metastatic lung carcinoma.    Visit type: Audio only (telephone). Audio (no video) only due to patient's lack of internet/smartphone capability. Alternative for in-person consultation at [facility], [place] [address] [place], [place] [employer], [place]. This visit type was conducted due to national recommendations for restrictions regarding the COVID-19 Pandemic (e.g. social distancing).  This format is felt to be most appropriate for this patient at this time.  All issues noted in this document were discussed and addressed.

## 2021-01-29 ENCOUNTER — Encounter: Payer: Self-pay | Admitting: *Deleted

## 2021-03-01 ENCOUNTER — Ambulatory Visit: Payer: Medicare Other | Admitting: Radiation Oncology

## 2021-03-04 ENCOUNTER — Ambulatory Visit: Payer: Medicare Other | Admitting: Radiation Oncology

## 2021-03-05 ENCOUNTER — Telehealth: Payer: Self-pay | Admitting: *Deleted

## 2021-03-05 NOTE — Telephone Encounter (Signed)
Left voicemail to see if Ms. Licciardi wanted to r/s her missed follow up appt. With Dr. Baruch Gouty.

## 2021-03-12 ENCOUNTER — Other Ambulatory Visit: Payer: Self-pay

## 2021-04-03 DEATH — deceased

## 2021-05-13 ENCOUNTER — Encounter: Payer: Self-pay | Admitting: Oncology

## 2021-05-20 ENCOUNTER — Encounter: Payer: Medicare Other | Admitting: Dermatology

## 2021-06-02 ENCOUNTER — Ambulatory Visit: Payer: Self-pay | Admitting: Radiation Oncology

## 2021-06-04 ENCOUNTER — Encounter: Payer: Self-pay | Admitting: Oncology

## 2021-10-06 ENCOUNTER — Other Ambulatory Visit (HOSPITAL_COMMUNITY): Payer: Self-pay | Admitting: PHYSICIAN ASSISTANT

## 2021-10-06 DIAGNOSIS — R195 Other fecal abnormalities: Secondary | ICD-10-CM

## 2021-12-01 ENCOUNTER — Ambulatory Visit (HOSPITAL_BASED_OUTPATIENT_CLINIC_OR_DEPARTMENT_OTHER): Payer: Self-pay | Admitting: Surgery

## 2021-12-15 ENCOUNTER — Encounter (HOSPITAL_BASED_OUTPATIENT_CLINIC_OR_DEPARTMENT_OTHER): Payer: Self-pay | Admitting: Surgery

## 2021-12-15 ENCOUNTER — Ambulatory Visit: Payer: Medicare Other | Attending: Surgery | Admitting: Surgery

## 2021-12-15 ENCOUNTER — Other Ambulatory Visit: Payer: Self-pay

## 2021-12-15 VITALS — BP 142/78 | HR 82 | Temp 97.2°F | Resp 18 | Ht 60.0 in | Wt 181.4 lb

## 2021-12-15 DIAGNOSIS — Z01818 Encounter for other preprocedural examination: Secondary | ICD-10-CM | POA: Insufficient documentation

## 2021-12-15 DIAGNOSIS — R195 Other fecal abnormalities: Secondary | ICD-10-CM | POA: Insufficient documentation

## 2021-12-15 DIAGNOSIS — Z86010 Personal history of colonic polyps: Secondary | ICD-10-CM | POA: Insufficient documentation

## 2021-12-15 MED ORDER — POLYETHYLENE GLYCOL 3350 17 GRAM/DOSE ORAL POWDER
ORAL | 0 refills | Status: DC
Start: 2021-12-15 — End: 2022-05-09

## 2021-12-15 NOTE — Patient Instructions (Signed)
Pre-operative Education for [facility] and Bowel Surgery  I reviewed the following with the patient/family:   Discussion of Diet, handout given to patient to follow prior to procedure.   Medication reviewed with patient, verbal instructions and handout was given to   patient indicating Medication to continue and Medication that must be stopped.   Bowel Prep Instructions reviewed and a paper handout was included in the patient  packet.   Date and Time of Procedure and arrive time discussed with patient and included in  Packet   Pre-op testing and time frame to have test completed were discussed and written on order form in patient packet.

## 2021-12-15 NOTE — H&P (Signed)
[facility]  Clinic History & Physical    Patient: Carrie Schmidt  D.O.B.: 1951-03-27  MRN# [id number]  Date of Service: 12/15/2021  REFERRING PROVIDER: Marnette Burgess Harward    HPI  ANTONISHA WASKEY is a 71 y.o. female who was seen today for Colonoscopy. She had a positive FIT. Her previous colonoscopy was in 2015 which shows a tubular adenoma in the sigmoid colon. She denies change in bowel habits, rectal bleeding, weight loss or abdominal pain. Denies family history of colon or rectal cancer.     Past Medical History:   Diagnosis Date   . Arthropathy    . Cancer (CMS HCC)     parotid gland removed   . Heart murmur     Diagnosed young, does not follow with cardiologist.    . HTN (hypertension)     Controlled on medications.    . Hyperlipidemia     Controlled on medications.    . Wears glasses           Past Surgical History:   Procedure Laterality Date   . CESAREAN SECTION      X2   . FOOT FRACTURE SURGERY     . HX CATARACT REMOVAL Left 08/05/2020    Dr. Juliane Lack   . HX EYELID SURGERY Bilateral 10/29/2020    S/P 10/29/20 BUL bleph   . KNEE ARTHROSCOPY Left    . SALIVARY GLAND SURGERY            Current Outpatient Medications   Medication Sig   . Amlodipine-Valsartan 10-320 mg Oral Tablet Take 1 Tablet by mouth Once a day   . aspirin (ECOTRIN) 81 mg Oral Tablet, Delayed Release (E.C.) Take 1 Tablet (81 mg total) by mouth Once a day   . carvediloL (COREG) 12.5 mg Oral Tablet Twice daily   . erythromycin (ROMYCIN) 5 mg/gram (0.5 %) Ophthalmic Ointment Apply to surgical incisions three times daily or as directed by your physician.   . ferrous sulfate (FERATAB) 324 mg (65 mg iron) Oral Tablet, Delayed Release (E.C.) Take 1 Tablet (324 mg total) by mouth   . MV with Min-Lycopene-Lutein (CENTRUM SILVER) 0.4 mg-300 mcg- 250 mcg Oral Tablet Take 1 Tablet by mouth Once a day   . neomycin-polymyxin-dexamethasone (MAXITROL) 3.5 mg/g-10,000 unit/g-0.1 % Ophthalmic Ointment Apply to eyelid scar OU   . polyethylene glycol  (MIRALAX) 17 gram/dose Oral Powder 2pm day before 4 dulcolax tabs, 4pm 238gm miralax w 64oz gatorade (no red/Purple) drink 8oz Q 35mn until gone   . rosuvastatin (CRESTOR) 10 mg Oral Tablet Once a day   . triamcinolone acetonide (ARISTOCORT A) 0.1 % Cream APPLY TO THE AFFECTED AREA(S) TWICE DAILY     Allergies   Allergen Reactions   . Contrast Dye [Iv Contrast] Hives/ Urticaria      Social History     Tobacco Use   . Smoking status: Former     Types: Cigarettes     Quit date: 1972     Years since quitting: 51.4   . Smokeless tobacco: Never   Substance Use Topics   . Alcohol use: Yes     Alcohol/week: 1.0 standard drink     Types: 1 Shots of liquor per week     Comment: Occasional.      Social History     Substance and Sexual Activity   Drug Use Never      Family Medical History:     Problem Relation (Age of Onset)  Cancer Maternal Grandmother    Cataract Mother, Father    Diabetes Mother              ROS:  General: Denies fever, chills, or any changes in weight.  EENT: Denies blurry vision, hearing loss.  Cardiovascular: Denies chest pain or palpitations.  Respiratory: Denies SOB, cough.  Gastrointestinal: Denies abdominal pain, N/V, constipation, or diarrhea. Denies any change in bowel movements or rectal bleeding.  GU: Denies dysuria, change in urinary habits, or hematuria.  Musculoskeletal: Denies weakness or joint pain. Denies trouble moving extremities.  Neurological: Denies HA, dizziness.  Hematologic: Denies hx of bleeding disorders or blood clots.  Psychiatric: Denies changes in mood, anxiety, or depression.    Objective:  BP (!) 142/78   Pulse 82   Temp 36.2 C (97.2 F)   Resp 18   Ht 1.524 m (5')   Wt 82.3 kg (181 lb 7 oz)   SpO2 95%   BMI 35.43 kg/m         Physical Exam:  Constitutional:  Alert, healthy, well nourished, no acute distress and not in pain.  HEENT: Normal  Neck: Trachea midline, supple.  Pulmonary: Normal effort and rate observed.  Cardiovascular: Regular rate and  rhythm.  Abdomen/GI: Abdomen soft and supple; No tenderness. Non-distended; No masses present. Bowel sounds normal and active in all 4 quadrants.  Extremities: No peripheral edema; No cyanosis or clubbing of nails.  Musculoskeletal: Normal muscle strength and tone of all four extremities.  Skin: No rashes or lesions present; Warm and dry. No jaundice.  Psychiatric: Normal mood and affect; Judgement and thought content normal.      Assessment:  ENCOUNTER DIAGNOSES     ICD-10-CM   1. Positive FIT (fecal immunochemical test)  R19.5   2. Preoperative testing  Z01.818   3. History of colon polyps  Z86.010        Plan:   Orders Placed This Encounter   Procedures   . LOWER ENDOSCOPY ( GEN SURG)     Standing Status:   Future     Standing Expiration Date:   12/16/2022     Order Specific Question:   Type of process requested     Answer:   COLONOSCOPY     Order Specific Question:   Serv. Provider     Answer:   Rowe Clack     Order Specific Question:   Stop anticoagulation therapy     Answer:   Other     Order Specific Question:   Medical Clearance     Answer:   N/A     Order Specific Question:   Type of anesthesia     Answer:   OTHER   . CBC/DIFF     Standing Status:   Future     Standing Expiration Date:   02/15/2023     Order Specific Question:   Release to patient     Answer:   Automated   . COMPREHENSIVE METABOLIC PANEL, NON-FASTING     Standing Status:   Future     Standing Expiration Date:   02/15/2023     Order Specific Question:   Release to patient     Answer:   Automated     Medication Orders   Medications   . polyethylene glycol (MIRALAX) 17 gram/dose Oral Powder     Sig: 2pm day before 4 dulcolax tabs, 4pm 238gm miralax w 64oz gatorade (no red/Purple) drink 8oz Q 80mn until gone     Dispense:  238 g     Refill:  0       Patient for colonoscopy    #1 I explained the risks of colonoscopy to the patient including bleeding, infection, missed polyps, inability to attain the cecum and need for barium enema, possibility of  perforation and need for emergency surgery etc. The patient agrees to proceed.     #2 The patient will have preoperative bowel prep and laboratory studies.         Return if symptoms worsen or fail to improve.    She was given the opportunity to ask questions and those questions were appropriately answered. She agreed with the treatment plan and is encouraged to call with any additional questions or concerns.       Donaciano Eva, LPN   I am scribing for, and in the presence of Dr. Marguerita Beards for services provided on 12/15/2021.  Donaciano Eva, LPN   I personally performed the services described in this documentation, as scribed  in my presence, and it is both accurate  and complete.    Marguerita Beards, MD

## 2022-04-25 ENCOUNTER — Encounter (HOSPITAL_COMMUNITY): Payer: Self-pay | Admitting: Surgery

## 2022-04-25 NOTE — Nursing Note (Signed)
COVID-19 SCREENING NOTE    Primary Care Provider: Marnette Burgess Harward, PA-C     Has the patient had recent travel to/from or exposure to someone with recent travel to a COVID-19 "hot spot"? No travel, no exposure  Has the patient had recent exposure to a known case of COVID-19: No  Has the patient had fever? No  Has the patient had cough? No  Has the patient had shortness of breath? No  Additional symptoms? no additional symptoms reported     There is no problem list on file for this patient.        Occupation: Data Unavailable    Disposition:   No testing indicated.        Manuela Schwartz, RN

## 2022-05-02 ENCOUNTER — Other Ambulatory Visit: Payer: Self-pay

## 2022-05-02 ENCOUNTER — Other Ambulatory Visit: Payer: Medicare Other | Attending: Surgery

## 2022-05-02 DIAGNOSIS — Z86010 Personal history of colonic polyps: Secondary | ICD-10-CM | POA: Insufficient documentation

## 2022-05-02 DIAGNOSIS — Z01818 Encounter for other preprocedural examination: Secondary | ICD-10-CM | POA: Insufficient documentation

## 2022-05-02 LAB — COMPREHENSIVE METABOLIC PANEL, NON-FASTING
ALBUMIN: 4.1 g/dL (ref 3.4–4.8)
ALKALINE PHOSPHATASE: 75 U/L (ref 55–145)
ALT (SGPT): 32 U/L — ABNORMAL HIGH (ref 8–22)
ANION GAP: 5 mmol/L (ref 4–13)
AST (SGOT): 29 U/L (ref 8–45)
BILIRUBIN TOTAL: 0.8 mg/dL (ref 0.3–1.3)
BUN/CREA RATIO: 22 (ref 6–22)
BUN: 21 mg/dL (ref 8–25)
CALCIUM: 9.6 mg/dL (ref 8.6–10.3)
CHLORIDE: 111 mmol/L (ref 96–111)
CO2 TOTAL: 26 mmol/L (ref 23–31)
CREATININE: 0.94 mg/dL (ref 0.60–1.05)
ESTIMATED GFR - FEMALE: 64 mL/min/BSA (ref >=60)
GLUCOSE: 118 mg/dL (ref 65–125)
POTASSIUM: 4.3 mmol/L (ref 3.5–5.1)
PROTEIN TOTAL: 6.8 g/dL (ref 6.0–8.0)
SODIUM: 142 mmol/L (ref 136–145)

## 2022-05-02 LAB — CBC WITH DIFF
BASOPHIL #: <0.1 10*3/uL (ref <=0.20)
BASOPHIL %: 1 %
EOSINOPHIL #: 0.38 10*3/uL (ref <=0.50)
EOSINOPHIL %: 7 %
HCT: 42.2 % (ref 34.8–46.0)
HGB: 14.5 g/dL (ref 11.5–16.0)
IMMATURE GRANULOCYTE #: <0.1 10*3/uL (ref <0.10)
IMMATURE GRANULOCYTE %: 0 % (ref 0–1)
LYMPHOCYTE #: 1.17 10*3/uL (ref 1.00–4.80)
LYMPHOCYTE %: 22 %
MCH: 30.1 pg (ref 26.0–32.0)
MCHC: 34.4 g/dL (ref 31.0–35.5)
MCV: 87.7 fL (ref 78.0–100.0)
MONOCYTE #: 0.58 10*3/uL (ref 0.20–1.10)
MONOCYTE %: 11 %
MPV: 10.3 fL (ref 8.7–12.5)
NEUTROPHIL #: 3.02 10*3/uL (ref 1.50–7.70)
NEUTROPHIL %: 59 %
PLATELETS: 204 10*3/uL (ref 150–400)
RBC: 4.81 10*6/uL (ref 3.85–5.22)
RDW-CV: 12.4 % (ref 11.5–15.5)
WBC: 5.2 10*3/uL (ref 3.7–11.0)

## 2022-05-09 ENCOUNTER — Other Ambulatory Visit: Payer: Self-pay

## 2022-05-09 ENCOUNTER — Encounter (HOSPITAL_COMMUNITY)
Admission: RE | Disposition: A | Discharge status: Home / Self Care | Payer: Self-pay | Source: Ambulatory Visit | Attending: Surgery

## 2022-05-09 ENCOUNTER — Ambulatory Visit (HOSPITAL_COMMUNITY): Payer: Medicare Other | Admitting: Certified Registered"

## 2022-05-09 ENCOUNTER — Inpatient Hospital Stay
Admission: RE | Admit: 2022-05-09 | Discharge: 2022-05-09 | Disposition: A | Discharge status: Home / Self Care | Payer: Medicare Other | Source: Ambulatory Visit | Attending: Surgery | Admitting: Surgery

## 2022-05-09 ENCOUNTER — Encounter (HOSPITAL_COMMUNITY): Payer: Self-pay | Admitting: Surgery

## 2022-05-09 DIAGNOSIS — K621 Rectal polyp: Secondary | ICD-10-CM | POA: Insufficient documentation

## 2022-05-09 DIAGNOSIS — Z1211 Encounter for screening for malignant neoplasm of colon: Secondary | ICD-10-CM

## 2022-05-09 DIAGNOSIS — D120 Benign neoplasm of cecum: Secondary | ICD-10-CM

## 2022-05-09 DIAGNOSIS — R195 Other fecal abnormalities: Secondary | ICD-10-CM

## 2022-05-09 DIAGNOSIS — D125 Benign neoplasm of sigmoid colon: Secondary | ICD-10-CM | POA: Insufficient documentation

## 2022-05-09 SURGERY — COLONOSCOPY
Anesthesia: General | Wound class: Clean Contaminated Wounds-The respiratory, GI, Genital, or urinary

## 2022-05-09 MED ORDER — PROPOFOL 10 MG/ML IV BOLUS
INJECTION | Freq: Once | INTRAVENOUS | Status: DC | PRN
Start: 2022-05-09 — End: 2022-05-09
  Administered 2022-05-09 (×3): 30 mg via INTRAVENOUS
  Administered 2022-05-09: 60 mg via INTRAVENOUS
  Administered 2022-05-09 (×3): 30 mg via INTRAVENOUS

## 2022-05-09 MED ORDER — LIDOCAINE (PF) 100 MG/5 ML (2 %) INTRAVENOUS SYRINGE
INJECTION | Freq: Once | INTRAVENOUS | Status: DC | PRN
Start: 2022-05-09 — End: 2022-05-09
  Administered 2022-05-09: 100 mg via INTRAVENOUS

## 2022-05-09 MED ORDER — ASPIRIN 81 MG TABLET,DELAYED RELEASE
81.0000 mg | DELAYED_RELEASE_TABLET | Freq: Every day | ORAL | Status: AC
Start: 2022-05-12 — End: ?

## 2022-05-09 MED ORDER — SODIUM CHLORIDE 0.9 % (FLUSH) INJECTION SYRINGE
3.0000 mL | INJECTION | Freq: Three times a day (TID) | INTRAMUSCULAR | Status: DC
Start: 2022-05-09 — End: 2022-05-09

## 2022-05-09 MED ORDER — SIMETHICONE 40 MG/0.6 ML ORAL DROPS,SUSPENSION
Freq: Once | ORAL | Status: DC | PRN
Start: 2022-05-09 — End: 2022-05-09
  Administered 2022-05-09: 40 mg

## 2022-05-09 MED ORDER — SODIUM CHLORIDE 0.9 % (FLUSH) INJECTION SYRINGE
3.0000 mL | INJECTION | INTRAMUSCULAR | Status: DC | PRN
Start: 2022-05-09 — End: 2022-05-09
  Administered 2022-05-09: 3 mL

## 2022-05-09 MED ORDER — LACTATED RINGERS INTRAVENOUS SOLUTION
INTRAVENOUS | Status: DC | PRN
Start: 2022-05-09 — End: 2022-05-09

## 2022-05-09 MED ORDER — LACTATED RINGERS INTRAVENOUS SOLUTION
INTRAVENOUS | Status: DC
Start: 2022-05-09 — End: 2022-05-09

## 2022-05-09 SURGICAL SUPPLY — 44 items
BRUSH CYTO 1.5MM 140CM BLT TIP ERG HNDL ATO STP SHEATH CLBR STRL DISP PTFE BRONCHSCP (ENDOSCOPIC SUPPLIES) IMPLANT
BRUSH CYTOLOGY BRONCHOSCOPY_ERG HNDL ATO STP SHEATH CLBR (INSTRUMENTS ENDOMECHANICAL)
CATH ELHMST INJ GLD PRB 7FR 25_GA .24MM 210CM BIPO RND DIST (INSTRUMENTS ENDOMECHANICAL)
CATH ELHMST INJ GLD PROBE 7FR 25GA .24MM 210CM BIPOLAR RND DIST TIP STD CONN INTGR DISP 2.8MM MN WRK (ENDOSCOPIC SUPPLIES) IMPLANT
CLIP HMST MR CONDITIONAL BRD CATH ROT CONTROL KNOB NO SHEATH RSL 360 235CM 2.8MM 11MM OPN (ENDOSCOPIC SUPPLIES) ×2 IMPLANT
DEVICE HEMOSTASIS CLIP 360_235CM RESOLUTION (INSTRUMENTS ENDOMECHANICAL) ×2
DISCONTINUED USE ITEM 328361 - JELLY LUB EZ BCTRST H2O SOL NG_RS FLPTP TUBE STRL 2OZ LF (MED SURG SUPPLIES) IMPLANT
FORCEPS BIOPSY HOT 240CM 2.2MM RJ 4 +2.8MM DISP (ENDOSCOPIC SUPPLIES)
FORCEPS BIOPSY HOT 240CM 2.2MM RJ 4 +2.8MM DISPO (ENDOSCOPIC SUPPLIES) IMPLANT
FORCEPS BIOPSY HOT 240CM 2.2MM_RJ 4 +2.8MM DISP (INSTRUMENTS ENDOMECHANICAL)
FORCEPS BIOPSY LRG CPC NEEDLE 240CM 2.2MM RJ 3 DISP ORNG (ENDOSCOPIC SUPPLIES) IMPLANT
FORCEPS BIOPSY NEEDLE 160CM 1.8MM RJ 4 DISP YW 2MM WRK CHNL GSPED (MED SURG SUPPLIES) IMPLANT
FORCEPS BIOPSY NEEDLE 160CM 1._8MM RJ 4 PED 2+ MM DISP (MED SURG SUPPLIES)
FORCEPS BIOPSY NEEDLE 240CM RJ 4 JMB (ENDOSCOPIC SUPPLIES) ×1 IMPLANT
FORCEPS BIOPSY NEEDLE 240CM RJ_4 JMB DISP (INSTRUMENTS ENDOMECHANICAL) ×1
FORCEPS BIOPSY NEEDLE 240CM RJ_4 LRG CPC DISP (INSTRUMENTS ENDOMECHANICAL)
GW ENDOSCOPIC .035IN 260CM DREAMWIRE ANG RX EGLD NITINOL BIL STRL DISP (ENDOSCOPIC SUPPLIES) IMPLANT
JELLY LUB EZ BCTRST H2O SOL NG_RS FLPTP TUBE STRL 2OZ LF (MED SURG SUPPLIES)
KIT CAN MDVC ASCP SPECI CNVRT_NONST LF (MED SURG SUPPLIES)
LIGATOR 2.8MM 8.6-11.5MM SSS7 ESOPH 1 STNG MLT BAND HNDL STRL DISP ENDOS HMSTS LF (ENDOSCOPIC SUPPLIES) IMPLANT
LIGATOR 2.8MM 8.6-11.5MM SSS7_ESOPH 1 STNG MLT BAND ERG HNDL (INSTRUMENTS ENDOMECHANICAL)
MARKER BLACK EYE ENDOSCOPIC (MED SURG SUPPLIES) ×1 IMPLANT
MARKER ENDOS PMT ENDOMARK DIL INDIA INK 10ML STRL (MED SURG SUPPLIES) IMPLANT
MARKER ENDOS SPOT EX PERM IND DRK SYRG 5ML (MED SURG SUPPLIES) IMPLANT
MARKER ENDOS SPOT EX PREFL PRE_ASSEMBLE SYRG PERM (MED SURG SUPPLIES)
NEEDLE SCLRTX 25GA 2.3MM BVL STRL DISP STAR CATH INTJCT 4MM 240CM (ENDOSCOPIC SUPPLIES) IMPLANT
NEEDLE SCLRTX 25GA 2.3MM BVL S_TRL DISP STAR CATH INTJCT 4MM (INSTRUMENTS ENDOMECHANICAL)
NEEDLE SCLRTX 25GA 2.3MM CSCP OPTC TIP STRL LF  DISP FLXTP 5MM 230CM STD (ENDOSCOPIC SUPPLIES) ×1 IMPLANT
NEEDLE SCLRTX 25GA 2.3MM CSCP_OPTC TIP STRL LF DISP FLXTP 5 (INSTRUMENTS ENDOMECHANICAL) ×1
NET SPEC RETR 230CM 2.5MM RTHNT STD SHEATH 6X3CM NONST LF  DISP (ENDOSCOPIC SUPPLIES) IMPLANT
NET SPEC RETR 230CM 2.5MM RTHN_T STD SHTH 6X3CM NONST LF (INSTRUMENTS ENDOMECHANICAL)
PEN SURG MRKNG INDIA INK (MED SURG SUPPLIES)
PROBE ESURG 220CM 2.3MM FIAPC FLXB STR FIRE STRL DISP (SURGICAL CUTTING SUPPLIES) IMPLANT
PROBE ESURG 220CM 2.3MM FIAPC_FLXB STR FIRE ARGON PLAS COAG (CUTTING ELEMENTS)
SNARE 2.8CM XL RND 240CM 33MM 2.4MM CAPTIVATR II LOOP STF SHEATH BRD WRE ENDOS PLPCTM STRL DISP (ENDOSCOPIC SUPPLIES) ×1 IMPLANT
SNARE 2.8CM XL RND 240CM 33MM_2.4MM CAPTIVATR II LOOP STF (INSTRUMENTS ENDOMECHANICAL) ×1
SNARE 230CM 2.5MM 3CM PLPK PLP_CTM OVL ENDOS 6CM STRL DISP (INSTRUMENTS ENDOMECHANICAL)
SNARE 230CM 2.5MM 3CM PLPK ROTR RTHNT ENDOS NONST LF  DISP (ENDOSCOPIC SUPPLIES) IMPLANT
SNARE LRG OVL MED STF ENDOS OL_MPS DISP (INSTRUMENTS ENDOMECHANICAL)
SNARE MED OVAL 240CM 2.4MM CAPTIVATR STF ENDOS PLYP 27MM DISP (ENDOSCOPIC SUPPLIES) IMPLANT
SNARE MED OVL 240CM 2.4MM CAPT_IVATR STF ENDOS PLYP 27MM DISP (INSTRUMENTS ENDOMECHANICAL)
SOCK CAN MEDIVAC ASCP SPECI CNVRT NONST LF (MED SURG SUPPLIES) IMPLANT
TRAY GASTRIC LAV 36IN 34FR ARGYLE EDLICH MONOJECT LRG PVC 4 EYE CLS END GRAD SYRG TRNSPR 140CC NONST (MED SURG SUPPLIES) IMPLANT
TRAY GASTRIC LAV 36IN 34FR ARG_YLE EDLICH MONOJECT LRG PVC 4 (MED SURG SUPPLIES)

## 2022-05-09 NOTE — OR PreOp (Signed)
BM clear yellow    COVID-19 Admission Screen    Low Risk:   Able to Provide asymptomatic History    Moderate/High Risk: {None    Test Result:Not Indicated

## 2022-05-09 NOTE — Anesthesia Preprocedure Evaluation (Signed)
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  No Facial hair  No Beard  No endotracheal tube present  No Tracheostomy present    Dental                    Pulmonary    Breath sounds clear to auscultation  (-) no rhonchi, no decreased breath sounds, no wheezes, no rales and no stridor     Cardiovascular    Rhythm: regular  Rate: Normal  (-) no friction rub, carotid bruit is not present, no peripheral edema and no murmur     Other findings              Plan  ASA 2     Planned anesthesia type: general     total intravenous anesthesia                  Intravenous induction       Anesthetic plan and risks discussed with patient and spouse             Patient's NPO status is appropriate for Anesthesia.           Plan discussed with CRNA.

## 2022-05-09 NOTE — H&P (Signed)
[facility]  Clinic History & Physical     Patient: Carrie Schmidt  D.O.B.: April 29, 1951  MRN# [id number]  Date of Service: 05/09/2022   REFERRING PROVIDER: Marnette Burgess Harward     HPI  DALAYNA LAUTER is a 71 y.o. female who was seen today for Colonoscopy. She had a positive FIT. Her previous colonoscopy was in 2015 which shows a tubular adenoma in the sigmoid colon. She denies change in bowel habits, rectal bleeding, weight loss or abdominal pain. Denies family history of colon or rectal cancer.           Past Medical History:   Diagnosis Date    Arthropathy      Cancer (CMS HCC)       parotid gland removed    Heart murmur       Diagnosed young, does not follow with cardiologist.     HTN (hypertension)       Controlled on medications.     Hyperlipidemia       Controlled on medications.     Wears glasses                    Past Surgical History:   Procedure Laterality Date    CESAREAN SECTION         X2    FOOT FRACTURE SURGERY        HX CATARACT REMOVAL Left 08/05/2020     Dr. Noemi Chapel EYELID SURGERY Bilateral 10/29/2020     S/P 10/29/20 BUL bleph    KNEE ARTHROSCOPY Left      SALIVARY GLAND SURGERY                     Current Outpatient Medications   Medication Sig    Amlodipine-Valsartan 10-320 mg Oral Tablet Take 1 Tablet by mouth Once a day    aspirin (ECOTRIN) 81 mg Oral Tablet, Delayed Release (E.C.) Take 1 Tablet (81 mg total) by mouth Once a day    carvediloL (COREG) 12.5 mg Oral Tablet Twice daily    erythromycin (ROMYCIN) 5 mg/gram (0.5 %) Ophthalmic Ointment Apply to surgical incisions three times daily or as directed by your physician.    ferrous sulfate (FERATAB) 324 mg (65 mg iron) Oral Tablet, Delayed Release (E.C.) Take 1 Tablet (324 mg total) by mouth    MV with Min-Lycopene-Lutein (CENTRUM SILVER) 0.4 mg-300 mcg- 250 mcg Oral Tablet Take 1 Tablet by mouth Once a day    neomycin-polymyxin-dexamethasone (MAXITROL) 3.5 mg/g-10,000 unit/g-0.1 % Ophthalmic Ointment Apply to eyelid scar OU     polyethylene glycol (MIRALAX) 17 gram/dose Oral Powder 2pm day before 4 dulcolax tabs, 4pm 238gm miralax w 64oz gatorade (no red/Purple) drink 8oz Q 89mn until gone    rosuvastatin (CRESTOR) 10 mg Oral Tablet Once a day    triamcinolone acetonide (ARISTOCORT A) 0.1 % Cream APPLY TO THE AFFECTED AREA(S) TWICE DAILY           Allergies   Allergen Reactions    Contrast Dye [Iv Contrast] Hives/ Urticaria      Social History            Tobacco Use    Smoking status: Former       Types: Cigarettes       Quit date: 1972       Years since quitting: 51.4    Smokeless tobacco: Never   Substance Use Topics    Alcohol use: Yes  Alcohol/week: 1.0 standard drink       Types: 1 Shots of liquor per week       Comment: Occasional.       Social History          Substance and Sexual Activity   Drug Use Never           Family Medical History:         Problem Relation (Age of Onset)     Cancer Maternal Grandmother     Cataract Mother, Father     Diabetes Mother                    ROS:  General: Denies fever, chills, or any changes in weight.  EENT: Denies blurry vision, hearing loss.  Cardiovascular: Denies chest pain or palpitations.  Respiratory: Denies SOB, cough.  Gastrointestinal: Denies abdominal pain, N/V, constipation, or diarrhea. Denies any change in bowel movements or rectal bleeding.  GU: Denies dysuria, change in urinary habits, or hematuria.  Musculoskeletal: Denies weakness or joint pain. Denies trouble moving extremities.  Neurological: Denies HA, dizziness.  Hematologic: Denies hx of bleeding disorders or blood clots.  Psychiatric: Denies changes in mood, anxiety, or depression.     Objective:  BP (!) 152/85   Pulse 65   Temp 36.4 C (97.5 F)   Resp 16   Ht 1.524 m (5')   Wt 78.7 kg (173 lb 8 oz)   SpO2 95%   BMI 33.88 kg/m             Physical Exam:  Constitutional:  Alert, healthy, well nourished, no acute distress and not in pain.  HEENT: Normal  Neck: Trachea midline, supple.  Pulmonary: Normal effort  and rate observed.  Cardiovascular: Regular rate and rhythm.  Abdomen/GI: Abdomen soft and supple; No tenderness. Non-distended; No masses present. Bowel sounds normal and active in all 4 quadrants.  Extremities: No peripheral edema; No cyanosis or clubbing of nails.  Musculoskeletal: Normal muscle strength and tone of all four extremities.  Skin: No rashes or lesions present; Warm and dry. No jaundice.  Psychiatric: Normal mood and affect; Judgement and thought content normal.       Assessment:       ENCOUNTER DIAGNOSES       ICD-10-CM   1. Positive FIT (fecal immunochemical test)  R19.5   2. Preoperative testing  Z01.818   3. History of colon polyps  Z86.010         Plan:         Orders Placed This Encounter   Procedures    LOWER ENDOSCOPY ([facility] [facility])       Standing Status:   Future       Standing Expiration Date:   12/16/2022       Order Specific Question:   Type of process requested       Answer:   COLONOSCOPY       Order Specific Question:   Serv. Provider       Answer:   Rowe Clack       Order Specific Question:   Stop anticoagulation therapy       Answer:   Other       Order Specific Question:   Medical Clearance       Answer:   N/A       Order Specific Question:   Type of anesthesia       Answer:   OTHER    CBC/DIFF  Standing Status:   Future       Standing Expiration Date:   02/15/2023       Order Specific Question:   Release to patient       Answer:   Automated    COMPREHENSIVE METABOLIC PANEL, NON-FASTING       Standing Status:   Future       Standing Expiration Date:   02/15/2023       Order Specific Question:   Release to patient       Answer:   Automated           Medication Orders   Medications    polyethylene glycol (MIRALAX) 17 gram/dose Oral Powder       Sig: 2pm day before 4 dulcolax tabs, 4pm 238gm miralax w 64oz gatorade (no red/Purple) drink 8oz Q 66mn until gone       Dispense:  238 g       Refill:  0         Patient for colonoscopy     #1 I explained the risks of colonoscopy to the patient  including bleeding, infection, missed polyps, inability to attain the cecum and need for barium enema, possibility of perforation and need for emergency surgery etc. The patient agrees to proceed.      #2 The patient will have preoperative bowel prep and laboratory studies.            Return if symptoms worsen or fail to improve.     She was given the opportunity to ask questions and those questions were appropriately answered. She agreed with the treatment plan and is encouraged to call with any additional questions or concerns.

## 2022-05-09 NOTE — Discharge Instructions (Signed)
A [employer] Resolution clip was placed during your procedure.     Non clinical testing has demonstrated the Resolution Clip and Resolution 360 Clip are MR conditional according to ASTM F2503.  A Patient with this clip can be safely scanned under the following conditions:     Static Magnetic Field  Static magnetic field of 1.5 and 3 Tesla with:   - Spatial gradient field of 2500 Gauss/cm (value extrapolated) and less   - Maximum whole body averaged specific absorption rate (SAR) of 2 W/kg in Normal Operating Mode for  a maximum scan time of 15 minutes of continuous scanning at 1.5T and at 3T    MR Related Heating  In non-clinical testing the "Resolution Clips" produced a temperatire rise of less than 1.4  C at a maximum extrapolated WBA SAR of 2.0 W/kg for 15 min of continuous MR scanning with body coil in a 1.5 Tesla Intera [employer] (software: release 12.6.1.3.2009-06-04) MR Scanner    In non clinical testing, the "Resolution Clips" produced a temperature rise of less than 4.0  C at a maximum extrapolated WBA SAR of 2.0 W/kg for 15 min of continuous MR scanning with a body coil in a 3 Tesla Magnetom Trio, [employer] ( software: Numaris/4, syngo [email or web address]) MR Scanner        Image Artifacts  MR image quality may be compromised if the area of interest is within approximately 80mm of the clip(s) as found in non clinical testing using a spin echo and gradient echo pulse sequence in 3T MR system) [employer], [employer], [place], [employer], software 2.6.3.7 2009-05-27). Therefore, it may be necessare to optimize MR Imaging parameters in the presence of this implant.     [employer] recommends that the patient register the MR conditions disclosed in this DFU with the [employer] ([email or web address]) or equivalent organization.

## 2022-05-09 NOTE — Anesthesia Postprocedure Evaluation (Signed)
Anesthesia Post Op Evaluation    Patient: Carrie Schmidt  Procedure(s):  COLONOSCOPY w/ polypectomies, clips and tattoo    Last Vitals:Temperature: 36.4 C (97.5 F) (05/09/22 1300)  Heart Rate: 67 (05/09/22 1459)  BP (Non-Invasive): 105/72 (05/09/22 1459)  Respiratory Rate: 16 (05/09/22 1459)  SpO2: 95 % (05/09/22 1459)    No notable events documented.    Patient is sufficiently recovered from the effects of anesthesia to participate in the evaluation and has returned to their pre-procedure level.  Patient location during evaluation: bedside       Patient participation: complete - patient participated  Level of consciousness: awake and alert and responsive to verbal stimuli    Pain score: 0  Pain management: adequate  Airway patency: patent    Anesthetic complications: no  Cardiovascular status: acceptable  Respiratory status: acceptable  Hydration status: acceptable  Patient post-procedure temperature: Pt Normothermic   PONV Status: Absent

## 2022-05-11 LAB — SURGICAL PATHOLOGY SPECIMEN

## 2022-06-15 ENCOUNTER — Telehealth (HOSPITAL_BASED_OUTPATIENT_CLINIC_OR_DEPARTMENT_OTHER): Payer: Self-pay | Admitting: Surgery

## 2022-06-15 NOTE — Telephone Encounter (Addendum)
Patient notified of results and recall made.       ----- Message from Marguerita Beards, MD sent at 06/12/2022  7:28 PM EST -----  Please call patient.  Benign colon polyp.  Recommend repeat colonoscopy in 3 years.  Please schedule follow up .
